# Patient Record
Sex: Female | Born: 1965
Health system: Southern US, Community
[De-identification: ages and names within clinical notes are randomized; demographics above are authoritative.]

## PROBLEM LIST (undated history)

## (undated) DIAGNOSIS — K649 Unspecified hemorrhoids: Secondary | ICD-10-CM

## (undated) DIAGNOSIS — R112 Nausea with vomiting, unspecified: Secondary | ICD-10-CM

## (undated) DIAGNOSIS — R7309 Other abnormal glucose: Secondary | ICD-10-CM

## (undated) DIAGNOSIS — K648 Other hemorrhoids: Secondary | ICD-10-CM

## (undated) DIAGNOSIS — F319 Bipolar disorder, unspecified: Secondary | ICD-10-CM

## (undated) DIAGNOSIS — F419 Anxiety disorder, unspecified: Secondary | ICD-10-CM

## (undated) DIAGNOSIS — E119 Type 2 diabetes mellitus without complications: Secondary | ICD-10-CM

## (undated) DIAGNOSIS — K625 Hemorrhage of anus and rectum: Secondary | ICD-10-CM

## (undated) DIAGNOSIS — K219 Gastro-esophageal reflux disease without esophagitis: Secondary | ICD-10-CM

## (undated) DIAGNOSIS — N938 Other specified abnormal uterine and vaginal bleeding: Secondary | ICD-10-CM

## (undated) DIAGNOSIS — R079 Chest pain, unspecified: Secondary | ICD-10-CM

## (undated) DIAGNOSIS — D649 Anemia, unspecified: Secondary | ICD-10-CM

## (undated) DIAGNOSIS — U071 COVID-19: Secondary | ICD-10-CM

## (undated) DIAGNOSIS — F329 Major depressive disorder, single episode, unspecified: Secondary | ICD-10-CM

## (undated) DIAGNOSIS — F32A Depression, unspecified: Secondary | ICD-10-CM

## (undated) DIAGNOSIS — R51 Headache: Secondary | ICD-10-CM

## (undated) DIAGNOSIS — N761 Subacute and chronic vaginitis: Secondary | ICD-10-CM

## (undated) DIAGNOSIS — E669 Obesity, unspecified: Secondary | ICD-10-CM

## (undated) DIAGNOSIS — K810 Acute cholecystitis: Secondary | ICD-10-CM

## (undated) DIAGNOSIS — K5792 Diverticulitis of intestine, part unspecified, without perforation or abscess without bleeding: Secondary | ICD-10-CM

## (undated) DIAGNOSIS — I10 Essential (primary) hypertension: Secondary | ICD-10-CM

## (undated) HISTORY — DX: Obesity, unspecified: E66.9

## (undated) HISTORY — DX: Chest pain, unspecified: R07.9

## (undated) HISTORY — PX: COLONOSCOPY: SHX174

## (undated) HISTORY — DX: Nausea with vomiting, unspecified: R11.2

## (undated) HISTORY — PX: ABDOMINAL HYSTERECTOMY: SHX81

## (undated) HISTORY — DX: Bipolar disorder, unspecified: F31.9

## (undated) HISTORY — DX: Other hemorrhoids: K64.8

## (undated) HISTORY — DX: Depression, unspecified: F32.A

## (undated) HISTORY — DX: Type 2 diabetes mellitus without complications: E11.9

## (undated) HISTORY — DX: Other specified abnormal uterine and vaginal bleeding: N93.8

## (undated) HISTORY — DX: Essential (primary) hypertension: I10

## (undated) HISTORY — PX: ROBOTIC ASSISTED LAP VAGINAL HYSTERECTOMY: SHX2362

## (undated) HISTORY — DX: Major depressive disorder, single episode, unspecified: F32.9

## (undated) HISTORY — DX: Subacute and chronic vaginitis: N76.1

## (undated) HISTORY — DX: Hemorrhage of anus and rectum: K62.5

## (undated) HISTORY — DX: Anxiety disorder, unspecified: F41.9

## (undated) HISTORY — PX: ECTOPIC PREGNANCY SURGERY: SHX613

## (undated) HISTORY — DX: Anemia, unspecified: D64.9

## (undated) HISTORY — DX: Other abnormal glucose: R73.09

## (undated) HISTORY — DX: Acute cholecystitis: K81.0

## (undated) HISTORY — PX: HEMORRHOID BANDING: SHX5850

---
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
## 2002-10-27 ENCOUNTER — Emergency Department (HOSPITAL_COMMUNITY): Admission: EM | Admit: 2002-10-27 | Discharge: 2002-10-27 | Payer: Self-pay | Admitting: Emergency Medicine

## 2003-09-24 ENCOUNTER — Ambulatory Visit (HOSPITAL_COMMUNITY): Admission: RE | Admit: 2003-09-24 | Discharge: 2003-09-24 | Payer: Self-pay | Admitting: Internal Medicine

## 2004-06-08 ENCOUNTER — Ambulatory Visit: Payer: Self-pay | Admitting: Internal Medicine

## 2004-08-09 ENCOUNTER — Ambulatory Visit: Payer: Self-pay | Admitting: Family Medicine

## 2004-08-25 ENCOUNTER — Ambulatory Visit: Payer: Self-pay | Admitting: Family Medicine

## 2004-08-26 ENCOUNTER — Ambulatory Visit: Payer: Self-pay | Admitting: *Deleted

## 2004-09-08 ENCOUNTER — Ambulatory Visit: Payer: Self-pay | Admitting: Family Medicine

## 2004-12-24 ENCOUNTER — Ambulatory Visit: Payer: Self-pay | Admitting: Family Medicine

## 2005-04-05 ENCOUNTER — Ambulatory Visit: Payer: Self-pay | Admitting: Family Medicine

## 2005-04-27 ENCOUNTER — Ambulatory Visit: Payer: Self-pay | Admitting: Family Medicine

## 2005-05-12 ENCOUNTER — Ambulatory Visit: Payer: Self-pay | Admitting: Family Medicine

## 2005-06-28 ENCOUNTER — Ambulatory Visit: Payer: Self-pay | Admitting: Family Medicine

## 2005-07-08 ENCOUNTER — Ambulatory Visit: Payer: Self-pay | Admitting: Family Medicine

## 2005-08-04 ENCOUNTER — Emergency Department (HOSPITAL_COMMUNITY): Admission: EM | Admit: 2005-08-04 | Discharge: 2005-08-04 | Payer: Self-pay | Admitting: Emergency Medicine

## 2005-08-22 ENCOUNTER — Ambulatory Visit: Payer: Self-pay | Admitting: Family Medicine

## 2005-09-12 ENCOUNTER — Ambulatory Visit: Payer: Self-pay | Admitting: Family Medicine

## 2005-10-14 ENCOUNTER — Emergency Department (HOSPITAL_COMMUNITY): Admission: EM | Admit: 2005-10-14 | Discharge: 2005-10-14 | Payer: Self-pay | Admitting: Emergency Medicine

## 2005-10-28 ENCOUNTER — Emergency Department (HOSPITAL_COMMUNITY): Admission: EM | Admit: 2005-10-28 | Discharge: 2005-10-28 | Payer: Self-pay | Admitting: Emergency Medicine

## 2006-12-12 ENCOUNTER — Emergency Department (HOSPITAL_COMMUNITY): Admission: EM | Admit: 2006-12-12 | Discharge: 2006-12-13 | Payer: Self-pay | Admitting: Emergency Medicine

## 2007-01-14 ENCOUNTER — Emergency Department (HOSPITAL_COMMUNITY): Admission: EM | Admit: 2007-01-14 | Discharge: 2007-01-14 | Payer: Self-pay | Admitting: Emergency Medicine

## 2007-04-18 ENCOUNTER — Emergency Department (HOSPITAL_COMMUNITY): Admission: EM | Admit: 2007-04-18 | Discharge: 2007-04-18 | Payer: Self-pay | Admitting: Emergency Medicine

## 2007-07-02 ENCOUNTER — Emergency Department (HOSPITAL_COMMUNITY): Admission: EM | Admit: 2007-07-02 | Discharge: 2007-07-02 | Payer: Self-pay | Admitting: Emergency Medicine

## 2010-09-22 ENCOUNTER — Emergency Department (HOSPITAL_COMMUNITY): Payer: Self-pay

## 2010-09-22 ENCOUNTER — Emergency Department (HOSPITAL_COMMUNITY)
Admission: EM | Admit: 2010-09-22 | Discharge: 2010-09-22 | Disposition: A | Discharge status: Home / Self Care | Payer: Self-pay | Attending: Emergency Medicine | Admitting: Emergency Medicine

## 2010-09-22 DIAGNOSIS — N83209 Unspecified ovarian cyst, unspecified side: Secondary | ICD-10-CM | POA: Insufficient documentation

## 2010-09-22 DIAGNOSIS — D259 Leiomyoma of uterus, unspecified: Secondary | ICD-10-CM | POA: Insufficient documentation

## 2010-09-22 DIAGNOSIS — N898 Other specified noninflammatory disorders of vagina: Secondary | ICD-10-CM | POA: Insufficient documentation

## 2010-09-22 DIAGNOSIS — R109 Unspecified abdominal pain: Secondary | ICD-10-CM | POA: Insufficient documentation

## 2010-09-22 LAB — URINE MICROSCOPIC-ADD ON

## 2010-09-22 LAB — DIFFERENTIAL
Basophils Absolute: 0 10*3/uL (ref 0.0–0.1)
Basophils Relative: 0 % (ref 0–1)
Eosinophils Absolute: 0.1 10*3/uL (ref 0.0–0.7)
Eosinophils Relative: 3 % (ref 0–5)
Lymphocytes Relative: 39 % (ref 12–46)
Lymphs Abs: 1.8 10*3/uL (ref 0.7–4.0)
Monocytes Absolute: 0.3 10*3/uL (ref 0.1–1.0)
Monocytes Relative: 7 % (ref 3–12)
Neutro Abs: 2.5 10*3/uL (ref 1.7–7.7)
Neutrophils Relative %: 52 % (ref 43–77)

## 2010-09-22 LAB — URINALYSIS, ROUTINE W REFLEX MICROSCOPIC
Bilirubin Urine: NEGATIVE
Glucose, UA: NEGATIVE mg/dL
Ketones, ur: NEGATIVE mg/dL
Leukocytes, UA: NEGATIVE
Nitrite: NEGATIVE
Protein, ur: NEGATIVE mg/dL
Specific Gravity, Urine: 1.027 (ref 1.005–1.030)
Urobilinogen, UA: 0.2 mg/dL (ref 0.0–1.0)
pH: 6 (ref 5.0–8.0)

## 2010-09-22 LAB — CBC
HCT: 34.7 % — ABNORMAL LOW (ref 36.0–46.0)
Hemoglobin: 11.3 g/dL — ABNORMAL LOW (ref 12.0–15.0)
MCH: 28.9 pg (ref 26.0–34.0)
MCHC: 32.6 g/dL (ref 30.0–36.0)
MCV: 88.7 fL (ref 78.0–100.0)
Platelets: 264 10*3/uL (ref 150–400)
RBC: 3.91 MIL/uL (ref 3.87–5.11)
RDW: 15.8 % — ABNORMAL HIGH (ref 11.5–15.5)
WBC: 4.7 10*3/uL (ref 4.0–10.5)

## 2010-09-22 LAB — POCT I-STAT, CHEM 8
BUN: 9 mg/dL (ref 6–23)
Calcium, Ion: 1.17 mmol/L (ref 1.12–1.32)
Chloride: 104 mEq/L (ref 96–112)
Creatinine, Ser: 0.7 mg/dL (ref 0.50–1.10)
Glucose, Bld: 97 mg/dL (ref 70–99)
HCT: 37 % (ref 36.0–46.0)
Hemoglobin: 12.6 g/dL (ref 12.0–15.0)
Potassium: 4.2 mEq/L (ref 3.5–5.1)
Sodium: 141 mEq/L (ref 135–145)
TCO2: 26 mmol/L (ref 0–100)

## 2010-09-22 LAB — POCT PREGNANCY, URINE: Preg Test, Ur: NEGATIVE

## 2010-09-22 LAB — WET PREP, GENITAL
Trich, Wet Prep: NONE SEEN
Yeast Wet Prep HPF POC: NONE SEEN

## 2010-09-23 LAB — URINE CULTURE
Colony Count: >=100000
Culture  Setup Time: 201207181552

## 2010-09-23 LAB — GC/CHLAMYDIA PROBE AMP, GENITAL
Chlamydia, DNA Probe: NEGATIVE
GC Probe Amp, Genital: NEGATIVE

## 2010-11-26 ENCOUNTER — Encounter: Payer: Self-pay | Admitting: Obstetrics & Gynecology

## 2010-11-26 ENCOUNTER — Other Ambulatory Visit (HOSPITAL_COMMUNITY)
Admission: RE | Admit: 2010-11-26 | Discharge: 2010-11-26 | Disposition: A | Discharge status: Home / Self Care | Payer: Self-pay | Source: Ambulatory Visit | Attending: Obstetrics & Gynecology | Admitting: Obstetrics & Gynecology

## 2010-11-26 ENCOUNTER — Ambulatory Visit (INDEPENDENT_AMBULATORY_CARE_PROVIDER_SITE_OTHER): Payer: Self-pay | Admitting: Obstetrics & Gynecology

## 2010-11-26 VITALS — BP 128/80 | HR 80 | Temp 97.6°F | Ht 66.0 in | Wt 154.7 lb

## 2010-11-26 DIAGNOSIS — N938 Other specified abnormal uterine and vaginal bleeding: Secondary | ICD-10-CM | POA: Insufficient documentation

## 2010-11-26 DIAGNOSIS — D259 Leiomyoma of uterus, unspecified: Secondary | ICD-10-CM

## 2010-11-26 DIAGNOSIS — N925 Other specified irregular menstruation: Secondary | ICD-10-CM | POA: Insufficient documentation

## 2010-11-26 DIAGNOSIS — N949 Unspecified condition associated with female genital organs and menstrual cycle: Secondary | ICD-10-CM

## 2010-11-26 DIAGNOSIS — D219 Benign neoplasm of connective and other soft tissue, unspecified: Secondary | ICD-10-CM

## 2010-11-26 LAB — POCT PREGNANCY, URINE: Preg Test, Ur: NEGATIVE

## 2010-11-26 MED ORDER — NORETHIN-ETH ESTRAD-FE BIPHAS 1 MG-10 MCG / 10 MCG PO TABS: 1.0000 | ORAL_TABLET | Freq: Every day | ORAL | Status: DC

## 2010-11-26 NOTE — Progress Notes (Signed)
Addended by: Sherre Lain A on: 11/26/2010 11:45 AM   Modules accepted: Orders

## 2010-11-26 NOTE — Progress Notes (Signed)
  Subjective:    Patient ID: Deborah Haney, female    DOB: January 26, 1966, 45 y.o.   MRN: [id number]  HPI  Deborah Haney is a 45 yo SAA G1P0ectopic1 who is referred here for evaluation of heavy bleeding.  She was seen at [facility] and a Ultrasound showed 2 large fibroids 6 cm and 5 cm. Her uterine lining was also thickened at 2 cm.  She was started on OCPs which has reduced the amount of bleeding.  Review of Systems Pap and mammogram within the last 12 month at health dept were reportedly normal    Objective:   Physical Exam  Bimanual reveals a 10-12 week size uterus and a narrow pelvic arch. Endometrial biopsy was done with a Pipelle for 2 passes and a large amount of tissue after prepping the cervix with betadine.  She tolerated the procedure well.      Assessment & Plan:  DUB and fibroids Await EMBX results. Consider TAH She will need a TSH at her next visit.

## 2010-11-30 LAB — DIFFERENTIAL
Basophils Absolute: 0
Basophils Relative: 1
Eosinophils Absolute: 0.2
Eosinophils Relative: 3
Lymphocytes Relative: 26
Lymphs Abs: 1.7
Monocytes Absolute: 0.5
Monocytes Relative: 7
Neutro Abs: 4.3
Neutrophils Relative %: 64

## 2010-11-30 LAB — CBC
HCT: 32.1 — ABNORMAL LOW
Hemoglobin: 10.6 — ABNORMAL LOW
MCHC: 33
MCV: 85.6
Platelets: 292
RBC: 3.75 — ABNORMAL LOW
RDW: 17.3 — ABNORMAL HIGH
WBC: 6.7

## 2010-11-30 LAB — RAPID URINE DRUG SCREEN, HOSP PERFORMED
Amphetamines: NOT DETECTED
Barbiturates: NOT DETECTED
Benzodiazepines: NOT DETECTED
Cocaine: POSITIVE — AB
Opiates: NOT DETECTED
Tetrahydrocannabinol: NOT DETECTED

## 2010-11-30 LAB — POCT I-STAT, CHEM 8
BUN: 12
Calcium, Ion: 1.17
Chloride: 104
Creatinine, Ser: 0.9
Glucose, Bld: 104 — ABNORMAL HIGH
HCT: 34 — ABNORMAL LOW
Hemoglobin: 11.6 — ABNORMAL LOW
Potassium: 4
Sodium: 141
TCO2: 27

## 2010-11-30 LAB — POCT PREGNANCY, URINE
Operator id: 27065
Preg Test, Ur: NEGATIVE

## 2010-12-13 ENCOUNTER — Telehealth: Payer: Self-pay | Admitting: *Deleted

## 2010-12-13 NOTE — Telephone Encounter (Signed)
Pt. Called today at 9:36 am and left a message she wants results from her 11/26/10 visit

## 2010-12-13 NOTE — Telephone Encounter (Signed)
Telephoned pt left message to return call.

## 2010-12-14 LAB — RAPID STREP SCREEN (MED CTR MEBANE ONLY): Streptococcus, Group A Screen (Direct): NEGATIVE

## 2010-12-15 NOTE — Telephone Encounter (Signed)
Spoke with patient, advised her of endometrial biopsy results. Pt wants to make an appointment to see Dr again to discuss fibroids. Pt transferred to front desk to make follow up apt.

## 2010-12-16 LAB — I-STAT 8, (EC8 V) (CONVERTED LAB)
Acid-Base Excess: 1
BUN: 10
Bicarbonate: 27.5 — ABNORMAL HIGH
Chloride: 105
Glucose, Bld: 92
HCT: 39
Hemoglobin: 13.3
Operator id: 270111
Potassium: 3.9
Sodium: 140
TCO2: 29
pCO2, Ven: 48.8
pH, Ven: 7.359 — ABNORMAL HIGH

## 2010-12-16 LAB — URINALYSIS, ROUTINE W REFLEX MICROSCOPIC
Glucose, UA: NEGATIVE
Hgb urine dipstick: NEGATIVE
Ketones, ur: 15 — AB
Nitrite: NEGATIVE
Protein, ur: 30 — AB
Specific Gravity, Urine: 1.031 — ABNORMAL HIGH
Urobilinogen, UA: 1
pH: 6

## 2010-12-16 LAB — CBC
HCT: 35.4 — ABNORMAL LOW
Hemoglobin: 11.6 — ABNORMAL LOW
MCHC: 32.8
MCV: 87.6
Platelets: 335
RBC: 4.04
RDW: 16.5 — ABNORMAL HIGH
WBC: 5.5

## 2010-12-16 LAB — URINE MICROSCOPIC-ADD ON

## 2010-12-16 LAB — WET PREP, GENITAL

## 2010-12-16 LAB — DIFFERENTIAL
Basophils Absolute: 0
Basophils Relative: 1
Eosinophils Absolute: 0.1
Eosinophils Relative: 1
Lymphocytes Relative: 34
Lymphs Abs: 1.9
Monocytes Absolute: 0.3
Monocytes Relative: 5
Neutro Abs: 3.3
Neutrophils Relative %: 59

## 2010-12-16 LAB — GC/CHLAMYDIA PROBE AMP, GENITAL
Chlamydia, DNA Probe: NEGATIVE
GC Probe Amp, Genital: NEGATIVE

## 2010-12-16 LAB — POCT PREGNANCY, URINE
Operator id: 272551
Preg Test, Ur: NEGATIVE

## 2010-12-16 LAB — POCT I-STAT CREATININE
Creatinine, Ser: 0.9
Operator id: 270111

## 2010-12-16 LAB — RPR: RPR Ser Ql: NONREACTIVE

## 2011-01-06 ENCOUNTER — Ambulatory Visit (INDEPENDENT_AMBULATORY_CARE_PROVIDER_SITE_OTHER): Payer: Self-pay | Admitting: Obstetrics & Gynecology

## 2011-01-06 DIAGNOSIS — N949 Unspecified condition associated with female genital organs and menstrual cycle: Secondary | ICD-10-CM

## 2011-01-06 DIAGNOSIS — Z23 Encounter for immunization: Secondary | ICD-10-CM

## 2011-01-06 DIAGNOSIS — N938 Other specified abnormal uterine and vaginal bleeding: Secondary | ICD-10-CM

## 2011-01-06 DIAGNOSIS — N925 Other specified irregular menstruation: Secondary | ICD-10-CM

## 2011-01-06 DIAGNOSIS — Z0000 Encounter for general adult medical examination without abnormal findings: Secondary | ICD-10-CM

## 2011-01-06 MED ORDER — NORGESTIM-ETH ESTRAD TRIPHASIC 0.18/0.215/0.25 MG-25 MCG PO TABS: 1.0000 | ORAL_TABLET | Freq: Every day | ORAL | Status: DC

## 2011-01-06 MED ORDER — NORGESTIM-ETH ESTRAD TRIPHASIC 0.18/0.215/0.25 MG-35 MCG PO TABS: 1.0000 | ORAL_TABLET | Freq: Every day | ORAL | Status: DC

## 2011-01-06 MED ORDER — INFLUENZA VIRUS VACC SPLIT PF IM SUSP
0.5000 mL | Freq: Once | INTRAMUSCULAR | Status: AC
  Administered 2011-01-06: 0.5 mL via INTRAMUSCULAR

## 2011-01-06 NOTE — Progress Notes (Signed)
  Subjective:    Patient ID: Deborah Haney, female    DOB: 1965/09/13, 45 y.o.   MRN: [id number]  HPI  Ms. Wiacek has had heavy periods due to her fibroids.  She had a benign endometrial biopsy at her last visit.    Review of Systems    She tells me she had a normal mammogram in January 2012. Objective:   Physical Exam        Assessment & Plan:  DUB from fibroids- I will complete the work up by checking a TSH today.  We have discussed treatment options. She declines depo provera but is willing to try OCPs.  She would like to have a TAH at least scheduled in case the OCPs don't help her bleeding.  She is willing to have a flu shot today. She will get a pap smear and BP check in about 2 weeks.

## 2011-01-07 LAB — TSH: TSH: 1.087 u[IU]/mL (ref 0.350–4.500)

## 2011-01-17 ENCOUNTER — Ambulatory Visit (INDEPENDENT_AMBULATORY_CARE_PROVIDER_SITE_OTHER): Payer: Self-pay

## 2011-01-17 VITALS — BP 133/86 | HR 74

## 2011-01-17 DIAGNOSIS — Z0130 Encounter for examination of blood pressure without abnormal findings: Secondary | ICD-10-CM

## 2011-01-17 DIAGNOSIS — Z136 Encounter for screening for cardiovascular disorders: Secondary | ICD-10-CM

## 2011-01-17 NOTE — Progress Notes (Signed)
Informed Dr. Adrian Blackwater of the BP and he stated that she was fine.  Pt instructed to continue regimen as directed.

## 2011-01-25 ENCOUNTER — Telehealth: Payer: Self-pay | Admitting: *Deleted

## 2011-01-25 MED ORDER — NORGESTIM-ETH ESTRAD TRIPHASIC 0.18/0.215/0.25 MG-35 MCG PO TABS: 1.0000 | ORAL_TABLET | Freq: Every day | ORAL | Status: DC

## 2011-01-25 NOTE — Telephone Encounter (Signed)
Spoke w/pt and she stated that her Rx was not sent to the pharmacy. I reviewed the order and discovered that the Rx was printed and not e-prescribed.  I sent the order through as originally prescribed. Pt asked about the recovery time and I told her that it will be approx. 6wks- in some cases pts may be able to return to work after 4 wks with MD permission.  Pt also would like to postpone her surgery until January when she will have sick leave available @ her job. Pt voiced understanding of all information and I transferred her call to [place] for surgery re-scheduling.

## 2011-01-25 NOTE — Telephone Encounter (Signed)
Pt left message stating that she has questions about surgery and recovery time. Please call back.

## 2011-01-29 ENCOUNTER — Encounter (HOSPITAL_COMMUNITY): Payer: Self-pay

## 2011-02-02 ENCOUNTER — Ambulatory Visit: Payer: Self-pay | Admitting: Advanced Practice Midwife

## 2011-02-04 ENCOUNTER — Other Ambulatory Visit (HOSPITAL_COMMUNITY): Payer: Self-pay

## 2011-03-02 ENCOUNTER — Encounter: Payer: Self-pay | Admitting: Family

## 2011-03-02 ENCOUNTER — Ambulatory Visit (INDEPENDENT_AMBULATORY_CARE_PROVIDER_SITE_OTHER): Payer: Self-pay | Admitting: Family

## 2011-03-02 VITALS — BP 126/88 | HR 81 | Temp 99.0°F | Ht 66.25 in | Wt 152.2 lb

## 2011-03-02 DIAGNOSIS — N938 Other specified abnormal uterine and vaginal bleeding: Secondary | ICD-10-CM

## 2011-03-02 DIAGNOSIS — N925 Other specified irregular menstruation: Secondary | ICD-10-CM

## 2011-03-02 DIAGNOSIS — Z01419 Encounter for gynecological examination (general) (routine) without abnormal findings: Secondary | ICD-10-CM

## 2011-03-02 DIAGNOSIS — N949 Unspecified condition associated with female genital organs and menstrual cycle: Secondary | ICD-10-CM

## 2011-03-02 DIAGNOSIS — Z0000 Encounter for general adult medical examination without abnormal findings: Secondary | ICD-10-CM

## 2011-03-02 HISTORY — DX: Other specified abnormal uterine and vaginal bleeding: N93.8

## 2011-03-02 NOTE — Progress Notes (Signed)
Addended by: Faythe Casa on: 03/02/2011 03:56 PM   Modules accepted: Orders

## 2011-03-02 NOTE — Patient Instructions (Signed)

## 2011-03-02 NOTE — Progress Notes (Signed)
  Subjective:     Deborah Haney is a 45 y.o. female and is here for a comprehensive physical exam. The patient reports improvement in vaginal bleeding since starting ocps, considering cancelling surgery.Marland Kitchen  History   Social History  . Marital Status: Single    Spouse Name: N/A    Number of Children: N/A  . Years of Education: N/A   Occupational History  . Not on file.   Social History Main Topics  . Smoking status: Former Smoker    Types: Cigarettes  . Smokeless tobacco: Never Used  . Alcohol Use: No  . Drug Use: Yes    Special: Cocaine     2 years ago  . Sexually Active: Yes    Birth Control/ Protection: Pill   Other Topics Concern  . Not on file   Social History Narrative  . No narrative on file   Health Maintenance  Topic Date Due  . Pap Smear  11/09/1985  . Tetanus/tdap  11/10/1986  . Influenza Vaccine  12/06/2011    The following portions of the patient's history were reviewed and updated as appropriate: allergies, current medications, past family history, past medical history, past social history, past surgical history and problem list.  Review of Systems Pertinent items are noted in HPI.   Objective:     General appearance: alert, cooperative and appears older than stated age Head: Normocephalic, without obvious abnormality, atraumatic Neck: no adenopathy, no carotid bruit, no JVD, supple, symmetrical, trachea midline and thyroid not enlarged, symmetric, no tenderness/mass/nodules Lungs: clear to auscultation bilaterally Breasts: normal appearance, no masses or tenderness, No nipple retraction or dimpling, No nipple discharge or bleeding, No axillary or supraclavicular adenopathy, Normal to palpation without dominant masses, Taught monthly breast self examination Heart: regular rate and rhythm, S1, S2 normal, no murmur, click, rub or gallop Abdomen: soft, non-tender; bowel sounds normal; no masses,  no organomegaly Pelvic: cervix normal in appearance, external  genitalia normal, no adnexal masses or tenderness, no cervical motion tenderness, rectovaginal septum normal, uterus normal size, shape, and consistency and vagina normal without discharge Skin: Skin color, texture, turgor normal. No rashes or lesions    Assessment:    Healthy female exam.  Plan:     Pap smear to lab. Explained self breast exam Pt to get mammogram next year (2013) [facility]

## 2011-03-25 ENCOUNTER — Inpatient Hospital Stay (HOSPITAL_COMMUNITY): Admission: RE | Admit: 2011-03-25 | Discharge: 2011-03-25 | Payer: Self-pay | Source: Ambulatory Visit

## 2011-03-25 NOTE — Patient Instructions (Signed)
   Your procedure is scheduled on: Monday, Jan 21st  Enter through the Main Entrance of [facility] at: 1130am Pick up the phone at the desk and dial [phone] and inform us of your arrival.  Please call this number if you have any problems the morning of surgery: [phone]  Remember: Do not eat food after midnight: Sunday Do not drink clear liquids after: 9am Monday Take these medicines the morning of surgery with a SIP OF WATER:  Do not wear jewelry, make-up, or FINGER nail polish Do not wear lotions, powders, perfumes or deodorant. Do not shave 48 hours prior to surgery. Do not bring valuables to the hospital.  Leave suitcase in the car. After Surgery it may be brought to your room. For patients being admitted to the hospital, checkout time is 11:00am the day of discharge.  Patients discharged on the day of surgery will not be allowed to drive home.     Remember to use your hibiclens as instructed.Please shower with 1/2 bottle the evening before your surgery and the other 1/2 bottle the morning of surgery.

## 2011-03-28 ENCOUNTER — Inpatient Hospital Stay (HOSPITAL_COMMUNITY): Admission: RE | Admit: 2011-03-28 | Payer: Self-pay | Source: Ambulatory Visit | Admitting: Obstetrics & Gynecology

## 2011-03-28 ENCOUNTER — Encounter (HOSPITAL_COMMUNITY): Admission: RE | Payer: Self-pay | Source: Ambulatory Visit

## 2011-03-28 SURGERY — ROBOTIC ASSISTED TOTAL HYSTERECTOMY
Anesthesia: General

## 2011-05-02 ENCOUNTER — Encounter (HOSPITAL_COMMUNITY): Payer: Self-pay

## 2011-05-09 ENCOUNTER — Encounter (HOSPITAL_COMMUNITY): Payer: Self-pay

## 2011-05-09 ENCOUNTER — Encounter (HOSPITAL_COMMUNITY)
Admission: RE | Admit: 2011-05-09 | Discharge: 2011-05-09 | Disposition: A | Discharge status: Home / Self Care | Payer: Self-pay | Source: Ambulatory Visit | Attending: Obstetrics & Gynecology | Admitting: Obstetrics & Gynecology

## 2011-05-09 HISTORY — DX: Diverticulitis of intestine, part unspecified, without perforation or abscess without bleeding: K57.92

## 2011-05-09 HISTORY — DX: Gastro-esophageal reflux disease without esophagitis: K21.9

## 2011-05-09 HISTORY — DX: Headache: R51

## 2011-05-09 LAB — SURGICAL PCR SCREEN
MRSA, PCR: NEGATIVE
Staphylococcus aureus: NEGATIVE

## 2011-05-09 LAB — CBC
HCT: 28.2 % — ABNORMAL LOW (ref 36.0–46.0)
Hemoglobin: 8.6 g/dL — ABNORMAL LOW (ref 12.0–15.0)
MCH: 22.1 pg — ABNORMAL LOW (ref 26.0–34.0)
MCHC: 30.5 g/dL (ref 30.0–36.0)
MCV: 72.3 fL — ABNORMAL LOW (ref 78.0–100.0)
Platelets: 296 10*3/uL (ref 150–400)
RBC: 3.9 MIL/uL (ref 3.87–5.11)
RDW: 19.3 % — ABNORMAL HIGH (ref 11.5–15.5)
WBC: 7.3 10*3/uL (ref 4.0–10.5)

## 2011-05-09 NOTE — Pre-Procedure Instructions (Signed)
Hgb 8.6 and Hct 28.2-T/S office-Marnie called to notify Dr Sherrian Divers orders at pat

## 2011-05-09 NOTE — Patient Instructions (Addendum)
   Your procedure is scheduled on: Monday March 11th  Enter through the Main Entrance of [facility] at: [employer] up the phone at the desk and dial [phone] and inform us of your arrival.  Please call this number if you have any problems the morning of surgery: [phone]  Remember: Do not eat food after midnight: Sunday Do not drink clear liquids after:midnight Sunday Take these medicines the morning of surgery with a SIP OF WATER: none  Do not wear jewelry, make-up, or FINGER nail polish Do not wear lotions, powders, perfumes or deodorant. Do not shave 48 hours prior to surgery. Do not bring valuables to the hospital. Contacts, dentures or bridgework may not be worn into surgery.  Leave suitcase in the car. After Surgery it may be brought to your room. For patients being admitted to the hospital, checkout time is 11:00am the day of discharge.  Patients discharged on the day of surgery will not be allowed to drive home.     Remember to use your hibiclens as instructed.Please shower with 1/2 bottle the evening before your surgery and the other 1/2 bottle the morning of surgery. Neck down avoiding private area.

## 2011-05-16 ENCOUNTER — Ambulatory Visit (HOSPITAL_COMMUNITY)
Admission: RE | Admit: 2011-05-16 | Discharge: 2011-05-16 | Disposition: A | Discharge status: Home / Self Care | Payer: Self-pay | Source: Ambulatory Visit | Attending: Obstetrics & Gynecology | Admitting: Obstetrics & Gynecology

## 2011-05-16 ENCOUNTER — Encounter (HOSPITAL_COMMUNITY): Payer: Self-pay

## 2011-05-16 ENCOUNTER — Ambulatory Visit (HOSPITAL_COMMUNITY): Payer: Self-pay

## 2011-05-16 ENCOUNTER — Encounter (HOSPITAL_COMMUNITY)
Admission: RE | Disposition: A | Discharge status: Home / Self Care | Payer: Self-pay | Source: Ambulatory Visit | Attending: Obstetrics & Gynecology

## 2011-05-16 DIAGNOSIS — D251 Intramural leiomyoma of uterus: Secondary | ICD-10-CM

## 2011-05-16 DIAGNOSIS — N8000 Endometriosis of the uterus, unspecified: Secondary | ICD-10-CM | POA: Insufficient documentation

## 2011-05-16 DIAGNOSIS — N920 Excessive and frequent menstruation with regular cycle: Secondary | ICD-10-CM

## 2011-05-16 DIAGNOSIS — N840 Polyp of corpus uteri: Secondary | ICD-10-CM | POA: Insufficient documentation

## 2011-05-16 DIAGNOSIS — D252 Subserosal leiomyoma of uterus: Secondary | ICD-10-CM

## 2011-05-16 DIAGNOSIS — D649 Anemia, unspecified: Secondary | ICD-10-CM

## 2011-05-16 DIAGNOSIS — N838 Other noninflammatory disorders of ovary, fallopian tube and broad ligament: Secondary | ICD-10-CM | POA: Insufficient documentation

## 2011-05-16 LAB — PREGNANCY, URINE: Preg Test, Ur: NEGATIVE

## 2011-05-16 SURGERY — ROBOTIC ASSISTED TOTAL HYSTERECTOMY
Anesthesia: General | Site: Abdomen | Laterality: Right | Wound class: Clean Contaminated

## 2011-05-16 MED ORDER — DEXAMETHASONE SODIUM PHOSPHATE 4 MG/ML IJ SOLN
INTRAMUSCULAR | Status: DC | PRN
  Administered 2011-05-16: 10 mg via INTRAVENOUS

## 2011-05-16 MED ORDER — ACETAMINOPHEN 10 MG/ML IV SOLN
1000.0000 mg | Freq: Four times a day (QID) | INTRAVENOUS | Status: DC
  Administered 2011-05-16: 1000 mg via INTRAVENOUS
  Filled 2011-05-16 (×4): qty 100

## 2011-05-16 MED ORDER — FENTANYL CITRATE 0.05 MG/ML IJ SOLN
INTRAMUSCULAR | Status: DC | PRN
  Administered 2011-05-16 (×4): 25 ug via INTRAVENOUS
  Administered 2011-05-16 (×4): 50 ug via INTRAVENOUS

## 2011-05-16 MED ORDER — DEXTROSE IN LACTATED RINGERS 5 % IV SOLN
INTRAVENOUS | Status: DC
  Administered 2011-05-16: 12:00:00 via INTRAVENOUS

## 2011-05-16 MED ORDER — MIDAZOLAM HCL 5 MG/5ML IJ SOLN
INTRAMUSCULAR | Status: DC | PRN
  Administered 2011-05-16: 2 mg via INTRAVENOUS

## 2011-05-16 MED ORDER — ROCURONIUM BROMIDE 100 MG/10ML IV SOLN
INTRAVENOUS | Status: DC | PRN
  Administered 2011-05-16: 10 mg via INTRAVENOUS
  Administered 2011-05-16: 5 mg via INTRAVENOUS
  Administered 2011-05-16: 50 mg via INTRAVENOUS

## 2011-05-16 MED ORDER — CEFAZOLIN SODIUM 1-5 GM-% IV SOLN
1.0000 g | INTRAVENOUS | Status: AC
  Administered 2011-05-16: 1 g via INTRAVENOUS

## 2011-05-16 MED ORDER — MIDAZOLAM HCL 2 MG/2ML IJ SOLN
INTRAMUSCULAR | Status: AC
  Filled 2011-05-16: qty 2

## 2011-05-16 MED ORDER — LACTATED RINGERS IV SOLN
INTRAVENOUS | Status: DC
  Administered 2011-05-16: 08:00:00 via INTRAVENOUS
  Administered 2011-05-16: 125 mL/h via INTRAVENOUS

## 2011-05-16 MED ORDER — LIDOCAINE HCL (CARDIAC) 20 MG/ML IV SOLN
INTRAVENOUS | Status: AC
  Filled 2011-05-16: qty 5

## 2011-05-16 MED ORDER — LIDOCAINE HCL (CARDIAC) 20 MG/ML IV SOLN
INTRAVENOUS | Status: DC | PRN
  Administered 2011-05-16: 30 mg via INTRAVENOUS

## 2011-05-16 MED ORDER — STERILE WATER FOR IRRIGATION IR SOLN
Status: DC | PRN
  Administered 2011-05-16: 1000 mL

## 2011-05-16 MED ORDER — NEOSTIGMINE METHYLSULFATE 1 MG/ML IJ SOLN
INTRAMUSCULAR | Status: AC
  Filled 2011-05-16: qty 10

## 2011-05-16 MED ORDER — LACTATED RINGERS IR SOLN
Status: DC | PRN
  Administered 2011-05-16: 3000 mL

## 2011-05-16 MED ORDER — FENTANYL CITRATE 0.05 MG/ML IJ SOLN
INTRAMUSCULAR | Status: AC
  Filled 2011-05-16: qty 5

## 2011-05-16 MED ORDER — IBUPROFEN 200 MG PO TABS: 800.0000 mg | ORAL_TABLET | Freq: Four times a day (QID) | ORAL | Status: AC | PRN

## 2011-05-16 MED ORDER — PANTOPRAZOLE SODIUM 40 MG PO TBEC
DELAYED_RELEASE_TABLET | ORAL | Status: AC
  Administered 2011-05-16: 40 mg via ORAL
  Filled 2011-05-16: qty 1

## 2011-05-16 MED ORDER — HYDROMORPHONE HCL PF 1 MG/ML IJ SOLN
INTRAMUSCULAR | Status: AC
  Administered 2011-05-16: 0.5 mg via INTRAVENOUS
  Filled 2011-05-16: qty 1

## 2011-05-16 MED ORDER — ROCURONIUM BROMIDE 50 MG/5ML IV SOLN
INTRAVENOUS | Status: AC
  Filled 2011-05-16: qty 1

## 2011-05-16 MED ORDER — ROPIVACAINE HCL 5 MG/ML IJ SOLN
INTRAMUSCULAR | Status: DC | PRN
  Administered 2011-05-16: 60 mL

## 2011-05-16 MED ORDER — DEXAMETHASONE SODIUM PHOSPHATE 10 MG/ML IJ SOLN
INTRAMUSCULAR | Status: AC
  Filled 2011-05-16: qty 1

## 2011-05-16 MED ORDER — KETOROLAC TROMETHAMINE 30 MG/ML IJ SOLN
INTRAMUSCULAR | Status: DC | PRN
  Administered 2011-05-16: 30 mg via INTRAVENOUS

## 2011-05-16 MED ORDER — PROPOFOL 10 MG/ML IV EMUL
INTRAVENOUS | Status: AC
  Filled 2011-05-16: qty 20

## 2011-05-16 MED ORDER — DEXTROSE IN LACTATED RINGERS 5 % IV SOLN: INTRAVENOUS | Status: DC

## 2011-05-16 MED ORDER — INDIGOTINDISULFONATE SODIUM 8 MG/ML IJ SOLN
INTRAMUSCULAR | Status: AC
  Filled 2011-05-16: qty 5

## 2011-05-16 MED ORDER — INDIGOTINDISULFONATE SODIUM 8 MG/ML IJ SOLN
INTRAMUSCULAR | Status: DC | PRN
  Administered 2011-05-16: 5 mL via INTRAVENOUS

## 2011-05-16 MED ORDER — ROPIVACAINE HCL 5 MG/ML IJ SOLN
INTRAMUSCULAR | Status: AC
  Filled 2011-05-16: qty 60

## 2011-05-16 MED ORDER — SODIUM CHLORIDE 0.9 % IJ SOLN
INTRAMUSCULAR | Status: DC | PRN
  Administered 2011-05-16: 60 mL via INTRAVENOUS

## 2011-05-16 MED ORDER — IBUPROFEN 800 MG PO TABS: 800.0000 mg | ORAL_TABLET | Freq: Four times a day (QID) | ORAL | Status: DC | PRN

## 2011-05-16 MED ORDER — HYDROMORPHONE HCL PF 1 MG/ML IJ SOLN
0.2500 mg | INTRAMUSCULAR | Status: DC | PRN
  Administered 2011-05-16: 0.5 mg via INTRAVENOUS

## 2011-05-16 MED ORDER — OXYCODONE-ACETAMINOPHEN 5-325 MG PO TABS: 1.0000 | ORAL_TABLET | ORAL | Status: AC | PRN

## 2011-05-16 MED ORDER — NEOSTIGMINE METHYLSULFATE 1 MG/ML IJ SOLN
INTRAMUSCULAR | Status: DC | PRN
  Administered 2011-05-16: 5 mg via INTRAVENOUS

## 2011-05-16 MED ORDER — FENTANYL CITRATE 0.05 MG/ML IJ SOLN
INTRAMUSCULAR | Status: AC
  Filled 2011-05-16: qty 2

## 2011-05-16 MED ORDER — PROPOFOL 10 MG/ML IV EMUL
INTRAVENOUS | Status: DC | PRN
  Administered 2011-05-16: 180 mg via INTRAVENOUS

## 2011-05-16 MED ORDER — GLYCOPYRROLATE 0.2 MG/ML IJ SOLN
INTRAMUSCULAR | Status: AC
  Filled 2011-05-16: qty 3

## 2011-05-16 MED ORDER — KETOROLAC TROMETHAMINE 30 MG/ML IJ SOLN
INTRAMUSCULAR | Status: AC
  Filled 2011-05-16: qty 1

## 2011-05-16 MED ORDER — PANTOPRAZOLE SODIUM 40 MG PO TBEC
40.0000 mg | DELAYED_RELEASE_TABLET | Freq: Once | ORAL | Status: AC
  Administered 2011-05-16: 40 mg via ORAL

## 2011-05-16 MED ORDER — CEFAZOLIN SODIUM 1-5 GM-% IV SOLN
INTRAVENOUS | Status: AC
  Filled 2011-05-16: qty 50

## 2011-05-16 MED ORDER — GLYCOPYRROLATE 0.2 MG/ML IJ SOLN
INTRAMUSCULAR | Status: DC | PRN
  Administered 2011-05-16: 1 mg via INTRAVENOUS

## 2011-05-16 MED ORDER — ONDANSETRON HCL 4 MG/2ML IJ SOLN
INTRAMUSCULAR | Status: DC | PRN
  Administered 2011-05-16: 4 mg via INTRAVENOUS

## 2011-05-16 MED ORDER — ONDANSETRON HCL 4 MG/2ML IJ SOLN
INTRAMUSCULAR | Status: AC
  Filled 2011-05-16: qty 2

## 2011-05-16 MED ORDER — OXYCODONE-ACETAMINOPHEN 5-325 MG PO TABS
1.0000 | ORAL_TABLET | ORAL | Status: DC | PRN
  Administered 2011-05-16: 1 via ORAL
  Filled 2011-05-16: qty 1

## 2011-05-16 SURGICAL SUPPLY — 78 items
ADH SKN CLS APL DERMABOND .7 (GAUZE/BANDAGES/DRESSINGS) ×1
APL SKNCLS STERI-STRIP NONHPOA (GAUZE/BANDAGES/DRESSINGS)
BAG URINE DRAINAGE (UROLOGICAL SUPPLIES) ×3 IMPLANT
BARRIER ADHS 3X4 INTERCEED (GAUZE/BANDAGES/DRESSINGS) IMPLANT
BENZOIN TINCTURE PRP APPL 2/3 (GAUZE/BANDAGES/DRESSINGS) IMPLANT
BLADE LAP MORCELLATOR 15X9.5 (ELECTROSURGICAL) IMPLANT
BRR ADH 4X3 ABS CNTRL BYND (GAUZE/BANDAGES/DRESSINGS)
CABLE HIGH FREQUENCY MONO STRZ (ELECTRODE) ×3 IMPLANT
CATH FOLEY 3WAY  5CC 16FR (CATHETERS) ×1
CATH FOLEY 3WAY 5CC 16FR (CATHETERS) ×2 IMPLANT
CHLORAPREP W/TINT 26ML (MISCELLANEOUS) ×3 IMPLANT
CLOTH BEACON ORANGE TIMEOUT ST (SAFETY) ×3 IMPLANT
CONT PATH 16OZ SNAP LID 3702 (MISCELLANEOUS) ×3 IMPLANT
COVER MAYO STAND STRL (DRAPES) ×3 IMPLANT
COVER TABLE BACK 60X90 (DRAPES) ×6 IMPLANT
COVER TIP SHEARS 8 DVNC (MISCELLANEOUS) ×2 IMPLANT
COVER TIP SHEARS 8MM DA VINCI (MISCELLANEOUS) ×1
DECANTER SPIKE VIAL GLASS SM (MISCELLANEOUS) ×12 IMPLANT
DERMABOND ADVANCED (GAUZE/BANDAGES/DRESSINGS) ×1
DERMABOND ADVANCED .7 DNX12 (GAUZE/BANDAGES/DRESSINGS) ×2 IMPLANT
DRAPE HUG U DISPOSABLE (DRAPE) ×3 IMPLANT
DRAPE LG THREE QUARTER DISP (DRAPES) ×6 IMPLANT
DRAPE MONITOR DA VINCI (DRAPE) ×3 IMPLANT
DRAPE WARM FLUID 44X44 (DRAPE) ×3 IMPLANT
ELECT REM PT RETURN 9FT ADLT (ELECTROSURGICAL) ×3
ELECTRODE REM PT RTRN 9FT ADLT (ELECTROSURGICAL) ×2 IMPLANT
EVACUATOR SMOKE 8.L (FILTER) ×3 IMPLANT
GAUZE VASELINE 3X9 (GAUZE/BANDAGES/DRESSINGS) IMPLANT
GLOVE BIO SURGEON STRL SZ 6.5 (GLOVE) ×9 IMPLANT
GLOVE BIOGEL PI IND STRL 6.5 (GLOVE) ×2 IMPLANT
GLOVE BIOGEL PI IND STRL 7.5 (GLOVE) ×4 IMPLANT
GLOVE BIOGEL PI INDICATOR 6.5 (GLOVE) ×1
GLOVE BIOGEL PI INDICATOR 7.5 (GLOVE) ×2
GLOVE ECLIPSE 6.0 STRL STRAW (GLOVE) ×15 IMPLANT
GLOVE ECLIPSE 6.5 STRL STRAW (GLOVE) ×12 IMPLANT
GLOVE ECLIPSE 7.0 STRL STRAW (GLOVE) ×9 IMPLANT
GOWN STRL REIN XL XLG (GOWN DISPOSABLE) ×27 IMPLANT
KIT ACCESSORY DA VINCI DISP (KITS)
KIT ACCESSORY DVNC DISP (KITS) IMPLANT
KIT DISP ACCESSORY 4 ARM (KITS) ×3 IMPLANT
MANIPULATOR UTERINE 4.5 ZUMI (MISCELLANEOUS) IMPLANT
NEEDLE HYPO 22GX1.5 SAFETY (NEEDLE) ×3 IMPLANT
NEEDLE INSUFFLATION 14GA 120MM (NEEDLE) ×3 IMPLANT
NEEDLE SPNL 18GX3.5 QUINCKE PK (NEEDLE) IMPLANT
NS IRRIG 1000ML POUR BTL (IV SOLUTION) ×9 IMPLANT
OCCLUDER COLPOPNEUMO (BALLOONS) ×6 IMPLANT
PACK LAVH (CUSTOM PROCEDURE TRAY) ×3 IMPLANT
PAD PREP 24X48 CUFFED NSTRL (MISCELLANEOUS) ×6 IMPLANT
PLUG CATH AND CAP STER (CATHETERS) ×3 IMPLANT
POSITIONER SURGICAL ARM (MISCELLANEOUS) ×6 IMPLANT
PROTECTOR NERVE ULNAR (MISCELLANEOUS) ×6 IMPLANT
SET CYSTO W/LG BORE CLAMP LF (SET/KITS/TRAYS/PACK) IMPLANT
SET IRRIG TUBING LAPAROSCOPIC (IRRIGATION / IRRIGATOR) ×3 IMPLANT
SOLUTION ELECTROLUBE (MISCELLANEOUS) ×3 IMPLANT
SPONGE LAP 18X18 X RAY DECT (DISPOSABLE) IMPLANT
STRIP CLOSURE SKIN 1/2X4 (GAUZE/BANDAGES/DRESSINGS) ×3 IMPLANT
SUT VIC AB 0 CT1 27 (SUTURE) ×13
SUT VIC AB 0 CT1 27XBRD ANBCTR (SUTURE) ×10 IMPLANT
SUT VIC AB 0 CT1 27XBRD ANTBC (SUTURE) ×8 IMPLANT
SUT VIC AB 2-0 CT2 27 (SUTURE) IMPLANT
SUT VICRYL 0 UR6 27IN ABS (SUTURE) ×6 IMPLANT
SUT VICRYL RAPIDE 4/0 PS 2 (SUTURE) ×6 IMPLANT
SUT VLOC 180 0 9IN  GS21 (SUTURE)
SUT VLOC 180 0 9IN GS21 (SUTURE) IMPLANT
SYR 30ML LL (SYRINGE) ×3 IMPLANT
SYR 50ML LL SCALE MARK (SYRINGE) ×3 IMPLANT
SYSTEM CONVERTIBLE TROCAR (TROCAR) IMPLANT
TIP UTERINE 5.1X6CM LAV DISP (MISCELLANEOUS) IMPLANT
TIP UTERINE 6.7X10CM GRN DISP (MISCELLANEOUS) ×3 IMPLANT
TIP UTERINE 6.7X6CM WHT DISP (MISCELLANEOUS) IMPLANT
TIP UTERINE 6.7X8CM BLUE DISP (MISCELLANEOUS) IMPLANT
TOWEL OR 17X24 6PK STRL BLUE (TOWEL DISPOSABLE) ×6 IMPLANT
TROCAR DISP BLADELESS 8 DVNC (TROCAR) ×2 IMPLANT
TROCAR DISP BLADELESS 8MM (TROCAR) ×1
TROCAR XCEL 12X100 BLDLESS (ENDOMECHANICALS) ×3 IMPLANT
TROCAR Z-THREAD 12X150 (TROCAR) ×3 IMPLANT
TROCAR Z-THREAD BLADED 12X100M (TROCAR) IMPLANT
TUBING FILTER THERMOFLATOR (ELECTROSURGICAL) ×3 IMPLANT

## 2011-05-16 NOTE — Anesthesia Postprocedure Evaluation (Signed)
Anesthesia Post Note  [name]  Procedure(s) Performed: Procedure(s) (LRB): ROBOTIC ASSISTED TOTAL HYSTERECTOMY (N/A) UNILATERAL SALPINGECTOMY (Right)  Anesthesia type: GA  Patient location: PACU  Post pain: Pain level controlled  Post assessment: Post-op Vital signs reviewed  Last Vitals:  Filed Vitals:   05/16/11 1016  BP:   Pulse: 79  Temp:   Resp: 20    Post vital signs: Reviewed  Level of consciousness: sedated  Complications: No apparent anesthesia complications

## 2011-05-16 NOTE — Anesthesia Procedure Notes (Signed)
Procedure Name: Intubation Date/Time: 05/16/2011 7:48 AM Performed by: Lincoln Brigham Pre-anesthesia Checklist: Patient identified, Patient being monitored, Emergency Drugs available, Timeout performed and Suction available Patient Re-evaluated:Patient Re-evaluated prior to inductionOxygen Delivery Method: Circle system utilized Preoxygenation: Pre-oxygenation with 100% oxygen Intubation Type: IV induction Ventilation: Mask ventilation without difficulty Laryngoscope Size: Miller and 2 Grade View: Grade I Tube type: Oral Laser Tube: Cuffed inflated with minimal occlusive pressure - saline Tube size: 7.0 mm Number of attempts: 1 Airway Equipment and Method: Stylet Placement Confirmation: ETT inserted through vocal cords under direct vision,  positive ETCO2 and breath sounds checked- equal and bilateral Secured at: 21 cm Tube secured with: Tape Dental Injury: Teeth and Oropharynx as per pre-operative assessment

## 2011-05-16 NOTE — Anesthesia Postprocedure Evaluation (Signed)
  Anesthesia Post-op Note  [name]  Procedure(s) Performed: Procedure(s) (LRB): ROBOTIC ASSISTED TOTAL HYSTERECTOMY (N/A) UNILATERAL SALPINGECTOMY (Right)  Patient Location: Women's Unit  Anesthesia Type: General  Level of Consciousness: alert  and oriented  Airway and Oxygen Therapy: Patient Spontanous Breathing  Post-op Pain: mild  Post-op Assessment: Patient's Cardiovascular Status Stable and Respiratory Function Stable  Post-op Vital Signs: stable  Complications: No apparent anesthesia complications

## 2011-05-16 NOTE — Transfer of Care (Signed)
Immediate Anesthesia Transfer of Care Note  [name]  Procedure(s) Performed: Procedure(s) (LRB): ROBOTIC ASSISTED TOTAL HYSTERECTOMY (N/A) UNILATERAL SALPINGECTOMY (Right)  Patient Location: PACU  Anesthesia Type: General  Level of Consciousness: awake, alert  and oriented  Airway & Oxygen Therapy: Patient Spontanous Breathing and Patient connected to nasal cannula oxygen  Post-op Assessment: Report given to PACU RN and Post -op Vital signs reviewed and stable  Post vital signs: Reviewed and stable  Complications: No apparent anesthesia complications

## 2011-05-16 NOTE — Anesthesia Preprocedure Evaluation (Addendum)
Anesthesia Evaluation  Patient identified by MRN, date of birth, ID band Patient awake    Reviewed: Allergy & Precautions, H&P , Patient's Chart, lab work & pertinent test results, reviewed documented beta blocker date and time   Airway Mallampati: II TM Distance: >3 FB Neck ROM: full    Dental No notable dental hx. (+) Dental Advisory Given,    Pulmonary  breath sounds clear to auscultation  Pulmonary exam normal       Cardiovascular Rhythm:regular Rate:Normal     Neuro/Psych    GI/Hepatic Controlled,  Endo/Other    Renal/GU      Musculoskeletal   Abdominal   Peds  Hematology   Anesthesia Other Findings   Reproductive/Obstetrics                          Anesthesia Physical Anesthesia Plan  ASA: II  Anesthesia Plan: General   Post-op Pain Management:    Induction: Intravenous  Airway Management Planned: Oral ETT  Additional Equipment:   Intra-op Plan:   Post-operative Plan:   Informed Consent: I have reviewed the patients History and Physical, chart, labs and discussed the procedure including the risks, benefits and alternatives for the proposed anesthesia with the patient or authorized representative who has indicated his/her understanding and acceptance.   Dental Advisory Given and Dental advisory given  Plan Discussed with: CRNA and Surgeon  Anesthesia Plan Comments: (  Discussed  general anesthesia, including possible nausea, instrumentation of airway, sore throat,pulmonary aspiration, etc. I asked if the were any outstanding questions, or  concerns before we proceeded. )        Anesthesia Quick Evaluation

## 2011-05-16 NOTE — H&P (Signed)
Deborah Haney is an 46 y.o. female. She has had long heavy periods for more than 5 years. Fibroids have been seen on u/s. She tried OCPs without success. In fact, her HBG dropped 3 grams while on OCPs.  Pertinent Gynecological History: Menses: flow is excessive with use of 8 pads or tampons on heaviest days Bleeding: dysfunctional uterine bleeding Contraception: OCPs DES exposure: unknown Blood transfusions: none Sexually transmitted diseases: past history: GC and CT about a year ago Previous GYN Procedures: embx negative 2012  Last mammogram: normal Date: 10/2009 Last pap: normal Date: 12/12 OB History: G2, P0A1 ectopic 1   Menstrual History: Menarche age: 89 No LMP recorded.    Past Medical History  Diagnosis Date  . Anemia     has history, takes iron  . Headache   . Diverticulitis     no problems  . GERD (gastroesophageal reflux disease)     tums prn    Past Surgical History  Procedure Date  . Ectopic pregnancy surgery     laparotomy    Family History  Problem Relation Age of Onset  . Diabetes Mother   . Arthritis Father     Social History:  reports that she has quit smoking. Her smoking use included Cigarettes. She has never used smokeless tobacco. She reports that she uses illicit drugs (Cocaine). She reports that she does not drink alcohol.  Allergies: No Known Allergies  Prescriptions prior to admission  Medication Sig Dispense Refill  . Ascorbic Acid (VITAMIN C PO) Take 1 tablet by mouth every 30 (thirty) days.      . ferrous sulfate 325 (65 FE) MG tablet Take 325 mg by mouth daily with breakfast. Takes only when she is menstruating      . hydrocortisone cream 0.5 % Apply 1 application topically 2 (two) times daily as needed. For itching      . ibuprofen (ADVIL,MOTRIN) 200 MG tablet Take 400-600 mg by mouth 2 (two) times daily as needed. For headache and cramping      . Norgestimate-Ethinyl Estradiol Triphasic (ORTHO TRI-CYCLEN,TRINESSA,TRI-SPRINTEC)  0.18/0.215/0.25 MG-35 MCG tablet Take 1 tablet by mouth daily.  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 She has bloody stool intermittently.  Blood pressure 130/88, pulse 75, temperature 98.1 F (36.7 C), temperature source Oral, resp. rate 16, SpO2 100.00%. Physical Exam Heart-rrr Lungs- CTAB Abd- benign, uterus palpable from abd exam about 12 week size  Results for orders placed during the hospital encounter of 05/16/11 (from the past 24 hour(s))  PREGNANCY, URINE     Status: Normal   Collection Time   05/16/11  6:21 AM      Component Value Range   Preg Test, Ur NEGATIVE  NEGATIVE     No results found.  Assessment/Plan: Menorrhagia, fibroids, anemia- We have discussed various approaches. I will do a RATH, removal of both oviducts, and cystoscopy. She is aware that I may need to convert to a laparatomy. She is also aware of the steep Trendelenburg position, risks to bowel, bladder, and ureter. All questions were answered and consents were signed.  Miu Chiong C. 05/16/2011, 7:11 AM

## 2011-05-16 NOTE — Discharge Instructions (Signed)
Hysterectomy Information  A hysterectomy is a procedure where your uterus is surgically removed. It will no longer be possible to have menstrual periods or to become pregnant. The tubes and ovaries can be removed (bilateral salpingo-oopherectomy) during this surgery as well.  REASONS FOR A HYSTERECTOMY  Persistent, abnormal bleeding.   Lasting (chronic) pelvic pain or infection.   The lining of the uterus (endometrium) starts growing outside the uterus (endometriosis).   The endometrium starts growing in the muscle of the uterus (adenomyosis).   The uterus falls down into the vagina (pelvic organ prolapse).   Symptomatic uterine fibroids.   Precancerous cells.   Cervical cancer or uterine cancer.  TYPES OF HYSTERECTOMIES  Supracervical hysterectomy. This type removes the top part of the uterus, but not the cervix.   Total hysterectomy. This type removes the uterus and cervix.   Radical hysterectomy. This type removes the uterus, cervix, and the fibrous tissue that holds the uterus in place in the pelvis (parametrium).  WAYS A HYSTERECTOMY CAN BE PERFORMED  Abdominal hysterectomy. A large surgical cut (incision) is made in the abdomen. The uterus is removed through this incision.   Vaginal hysterectomy. An incision is made in the vagina. The uterus is removed through this incision. There are no abdominal incisions.   Conventional laparoscopic hysterectomy. A thin, lighted tube with a camera (laparoscope) is inserted into 3 or 4 small incisions in the abdomen. The uterus is cut into small pieces. The small pieces are removed through the incisions, or they are removed through the vagina.   Laparoscopic assisted vaginal hysterectomy (LAVH). Three or four small incisions are made in the abdomen. Part of the surgery is performed laparoscopically and part vaginally. The uterus is removed through the vagina.   Robot-assisted laparoscopic hysterectomy. A laparoscope is inserted into 3 or 4  small incisions in the abdomen. A computer-controlled device is used to give the surgeon a 3D image. This allows for more precise movements of surgical instruments. The uterus is cut into small pieces and removed through the incisions or removed through the vagina.  RISKS OF HYSTERECTOMY   Bleeding and risk of blood transfusion. Tell your caregiver if you do not want to receive any blood products.   Blood clots in the legs or lung.   Infection.   Injury to surrounding organs.   Anesthesia problems or side effects.   Conversion to an abdominal hysterectomy.  WHAT TO EXPECT AFTER A HYSTERECTOMY  You will be given pain medicine.   You will need to have someone with you for the first 3 to 5 days after you go home.   You will need to follow up with your surgeon in 2 to 4 weeks after surgery to evaluate your progress.   You may have early menopause symptoms like hot flashes, night sweats, and insomnia.   If you had a hysterectomy for a problem that was not a cancer or a condition that could lead to cancer, then you no longer need Pap tests. However, even if you no longer need a Pap test, a regular exam is a good idea to make sure no other problems are starting.  Document Released: 08/17/2000 Document Revised: 02/10/2011 Document Reviewed: 10/02/2010 [facility] Patient Information 2012 [place], [place].   NOTHING PER VAGINA FOR 8 WEEKS!

## 2011-05-16 NOTE — Op Note (Signed)
05/16/2011  10:06 AM  PATIENT:  Deborah Haney  46 y.o. female  PRE-OPERATIVE DIAGNOSIS:  Symptomatic fibroids, menorrhagia, anemia  POST-OPERATIVE DIAGNOSIS:  same  PROCEDURE:  Procedure(s) (LRB): ROBOTIC ASSISTED TOTAL HYSTERECTOMY (N/A) UNILATERAL SALPINGECTOMY (Right) CYSTOSCOPY  SURGEON:  Surgeon(s) and Role:    * Allie Bossier, MD - Primary    * Catalina Antigua, MD - Assisting  PHYSICIAN ASSISTANT:   ASSISTANTS: none   ANESTHESIA:   general  EBL:  Total I/O In: 1300 [I.V.:1000; Other:300] Out: 500 [Urine:450; Blood:50]  BLOOD ADMINISTERED:none  DRAINS: none   LOCAL MEDICATIONS USED:  OTHER 60 mL Ropivicaine  SPECIMEN:  Source of Specimen:  uterus, right oviduct  DISPOSITION OF SPECIMEN:  PATHOLOGY  COUNTS:  YES  TOURNIQUET:  * No tourniquets in log *  DICTATION: .Dragon Dictation  PLAN OF CARE: outpatient with extended recovery  PATIENT DISPOSITION:  PACU - hemodynamically stable.   Delay start of Pharmacological VTE agent (>24hrs) due to surgical blood loss or risk of bleeding: not applicable  The risks, benefits, and alternatives of surgery were explained, understood, and accepted. All questions were answered. Consents were signed. She was taken to the operating room and placed in the dorsal lithotomy position. When she reported or self to be comfortable, general anesthesia was applied without difficulty. Her arms were carefully tucked and her head was carefully dissected. Her abdomen and vagina were prepped and draped in the usual sterile fashion. A bimanual exam revealed a 14 week size mobile uterus. A uterine manipulator was placed after measuring the uterine cavity to be 10 cm and dilating the cervix to accommodate the Rumi uterine manipulator. A paracervical block was done with 20 mL of dilute ropivacaine solution. A Foley catheter was placed, and it drained clear urine throughout this case. Gloves were changed, and attention was given to the abdomen.  Prior to any incisions, dilute ropivacaine was injected. A supra-umbilical vertical incision was made, and a varies needle was placed. Low-flow CO2 was used to insufflate the abdomen to approximately 15 liters. The pelvis was inspected in an alpha manner. The uterus was greatly enlarged, do to the fibroids. It was mobile. The left oviduct was nonexistent, probably because of her previous left ectopic. The right oviduct appeared normal and both ovaries appeared normal. At this point a 12 mm assistant port was placed under direct laparoscopic visualization. An 8 mm robotic port was placed approximately 10 cm lateral to the camera port on each side, under direct laparoscopic visualization. Once the robot was docked, I proceeded to work at the consult. The utero-ovarian ligaments were each clamped and cauterized with the P K device, yielding excellent hemostasis. Both round ligaments were identified and cauterized and ligated. Please note that the position of the ureters was identified at the beginning of the case. A bladder flap was created anteriorly. Both uterine vessels were identified and cauterized. A colpotomy was made anteriorly. A colpotomy incision was carried circumferentially beneath the uterus. Excellent hemostasis was noted at the cuff. Because of the size of the uterus, I had to remove one of the large anterior fibroids with the cautery device in order to be able to pull the uterus out through the vagina. I did have to morcellate the uterus from the vagina. The separate fibroid was then removed from the vagina. Once more at the console, I used 0 Vicryl sutures to placed 4 figure-of-eight sutures of the vaginal cuff,yielding excellent hemostasis. All pedicles were noted be hemostatic. I did remove the  oviduct on the right and removed through the assistant port. At this point I did a cystoscopy and saw indigo carmine coming from each ureter. Please note that laparoscopically the ureters were functioning  well on each side and appeared of normal caliber. We used an Endoloop closure to close the 12 mm system port. The supraumbilical port was closed with a figure-of-eight 0 Vicryl suture the subcuticular closures were all done with 4-0 Vicryl suture. She tolerated the procedure well and was taken to recovery room in stable condition.

## 2011-05-18 ENCOUNTER — Telehealth: Payer: Self-pay | Admitting: *Deleted

## 2011-05-18 NOTE — Telephone Encounter (Signed)
Pt left message stating that she had surgery on 3/11 and has questions- c/o SOB and wants to know when she can take a shower or bath.  I returned pt call and discussed her concerns. I told her that I will need to consult w/Dr. Marice Potter and call her back later today.

## 2011-05-18 NOTE — Telephone Encounter (Signed)
Dr. Marice Potter called pt and answered her questions.

## 2011-05-26 ENCOUNTER — Telehealth: Payer: Self-pay | Admitting: Obstetrics and Gynecology

## 2011-05-26 NOTE — Telephone Encounter (Signed)
Left a message for patient to call us back in regard of her message left earlier about questions she have in regard of a laparoscopy performed 05/16/2011.

## 2011-05-26 NOTE — Telephone Encounter (Signed)
Patient called with questions about whether or not she can drive post laparoscopy 9/60/45 and that she finds blood in her stools from the last 3 BM. Advised patient that she needs to get a post-op follow up appt first and then she'll get clearance then on whether she can start driving or not. Ok per [employer]. She is also to monitor blood in stools and will discuss at time of follow up visit. If she start to see worsening symptoms such as increasing weakness, hardened abdomen, or increase in blood in stools to get immediate help. Patient agrees.

## 2011-05-30 ENCOUNTER — Telehealth: Payer: Self-pay | Admitting: *Deleted

## 2011-05-30 NOTE — Telephone Encounter (Signed)
Called pt and offered appt on 06/02/11 @ 1615 to see Dr. Marice Potter. Pt agreed.

## 2011-05-30 NOTE — Telephone Encounter (Signed)
I left message stating she would like a note from Dr. Marice Potter stating that she may return to work on light duty.  I returned pt's call and informed her that she will need to have post op evaluation by Dr. Marice Potter prior to returning to work. 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 I told pt that I will check to see if she can be scheduled for an appt sooner than 4/26. I will call her back once I have information. Pt voiced understanding.

## 2011-06-01 ENCOUNTER — Ambulatory Visit (INDEPENDENT_AMBULATORY_CARE_PROVIDER_SITE_OTHER): Payer: Self-pay | Admitting: Obstetrics & Gynecology

## 2011-06-01 ENCOUNTER — Encounter: Payer: Self-pay | Admitting: Obstetrics & Gynecology

## 2011-06-01 DIAGNOSIS — Z09 Encounter for follow-up examination after completed treatment for conditions other than malignant neoplasm: Secondary | ICD-10-CM

## 2011-06-01 NOTE — Progress Notes (Signed)
  Subjective:    Patient ID: Deborah Haney, female    DOB: [birth year], 46 y.o.   MRN: [id number]  HPI  She is now 2 1/2 weeks post op status post RATH. Her pathol showed a 452 gram uterus and adenomyosis and fibroids. 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 She has not and won't have sex until her 8 weeks post op check  Review of Systems     Objective:   Physical Exam  Incisions healed well      Assessment & Plan:  Post op doing well RTC 6 weeks.

## 2011-06-10 ENCOUNTER — Telehealth: Payer: Self-pay | Admitting: *Deleted

## 2011-06-10 NOTE — Telephone Encounter (Signed)
Pt left message stating that she has vaginal d/c. She had robot hysterectomy on 05/16/11. I returned her call and left a message that we will need more details about the discharge. Please call back and leave a detailed message so that we can advise you.

## 2011-06-10 NOTE — Telephone Encounter (Signed)
Patient called at 8:55 and left another message stating her vaginal discharge is heavy, not constant and has a strong acidic smell and has had it for 2 weeks.

## 2011-06-10 NOTE — Telephone Encounter (Signed)
I called pt and left message on her personal voice mail that she will require evaluation of the d/c prior to treatment. The problem is not urgent and is not considered to be related to her surgery.  (**Note: pt saw Dr. Marice Potter in our office on 06/01/11 and did not report this d/c)  I will call her next week with a work-in appt or she may go to [facility] for evaluation if she does not want to wait.

## 2011-06-13 NOTE — Telephone Encounter (Signed)
Called pt and left message that she has been scheduled to see Dr. Marice Potter on 06/16/11 @ 1415 to evaluate the vaginal d/c. If she no longer needs the appt, please call to cancel.

## 2011-06-16 ENCOUNTER — Encounter: Payer: Self-pay | Admitting: Obstetrics & Gynecology

## 2011-06-16 ENCOUNTER — Ambulatory Visit (INDEPENDENT_AMBULATORY_CARE_PROVIDER_SITE_OTHER): Payer: Self-pay | Admitting: Obstetrics & Gynecology

## 2011-06-16 VITALS — BP 148/73 | HR 73 | Temp 97.4°F | Ht 66.0 in | Wt 148.5 lb

## 2011-06-16 DIAGNOSIS — N898 Other specified noninflammatory disorders of vagina: Secondary | ICD-10-CM

## 2011-06-16 MED ORDER — METRONIDAZOLE 500 MG PO TABS: 500.0000 mg | ORAL_TABLET | Freq: Three times a day (TID) | ORAL | Status: AC

## 2011-06-16 NOTE — Progress Notes (Signed)
  Subjective:    Patient ID: Deborah Haney, female    DOB: [birth year], 46 y.o.   MRN: [id number]  HPI  Ms. Alonge is 4 weeks post op status post RATH. She went back to work about 1 week post op and has no post op problems, just a vaginal discharge. She denies intercourse.  Review of Systems     Objective:   Physical Exam  Frothy white discharge consistent with BV      Assessment & Plan:  Probable BV- I will send a wet prep but go ahead and treat her with Flagyl.

## 2011-06-17 LAB — WET PREP, GENITAL
Trich, Wet Prep: NONE SEEN
Yeast Wet Prep HPF POC: NONE SEEN

## 2011-07-01 ENCOUNTER — Ambulatory Visit: Payer: Self-pay | Admitting: Obstetrics & Gynecology

## 2011-07-08 ENCOUNTER — Ambulatory Visit (INDEPENDENT_AMBULATORY_CARE_PROVIDER_SITE_OTHER): Payer: Self-pay | Admitting: Obstetrics & Gynecology

## 2011-07-08 ENCOUNTER — Encounter: Payer: Self-pay | Admitting: Obstetrics & Gynecology

## 2011-07-08 DIAGNOSIS — Z09 Encounter for follow-up examination after completed treatment for conditions other than malignant neoplasm: Secondary | ICD-10-CM

## 2011-07-08 NOTE — Progress Notes (Signed)
  Subjective:    Patient ID: Deborah Haney, female    DOB: 1965-05-08, 46 y.o.   MRN: [id number]  HPI  Deborah Haney is now 9 weeks post op (RATH). Her only complaint is that of a lot of work-related stress. She works for a [employer] and they are upset and have suspended her because she told them she had sex (against her contract). She reports that the IC was orgasmic, no painful, and no bleeding.  Review of Systems     Objective:   Physical Exam Small amount of granulation tissue at the left edge of cuff (treated today with silver nitrate)      Assessment & Plan:  Post op stable RTC 1 year or prn sooner

## 2011-10-21 ENCOUNTER — Encounter: Payer: Self-pay | Admitting: Obstetrics & Gynecology

## 2011-10-21 ENCOUNTER — Ambulatory Visit (INDEPENDENT_AMBULATORY_CARE_PROVIDER_SITE_OTHER): Payer: Self-pay | Admitting: Obstetrics & Gynecology

## 2011-10-21 VITALS — BP 137/87 | HR 75 | Temp 97.1°F | Ht 66.0 in | Wt 151.3 lb

## 2011-10-21 DIAGNOSIS — L293 Anogenital pruritus, unspecified: Secondary | ICD-10-CM

## 2011-10-21 DIAGNOSIS — N898 Other specified noninflammatory disorders of vagina: Secondary | ICD-10-CM

## 2011-10-21 NOTE — Progress Notes (Signed)
Subjective:     Patient ID: Deborah Haney, female   DOB: 1965/03/13, 46 y.o.   MRN: [id number]  HPI Pt presents with c/o vulvar itching.  Pt worries that her partner is cheating on her.  She was screened for STI's at the health dept 1 week ago and has not been called with any results.  Pt c/o vaginal dryness as well. She is not sure if this proceeded the infidelity.  She is most concerned with the risk of an STI  Pt douched just before this exam.   Review of Systems n/c    Objective:   Physical ExamPt in NAD GU: EGBUS: no lesions Vagina: no blood in vault; vaginal tissues sl swollen, no dryness but there was LOTS of clear water like fluid (pt douched just before the exam)   Cervix: no lesion; no mucopurulent d/c      Assessment:    Vaginal itching- normal exam    Plan:     F/u records from Health Dept F/u sooner prn D/w with not to douche ESPECIALLY just before her physician GYN visit.  Sander Speckman L. Harraway-Smith, M.D., [place]

## 2011-10-21 NOTE — Patient Instructions (Signed)
Vulvar hygiene:  Avoid perfumed soaps, detergents, lotions and ANY type of douches; in addition to wearing cotton underwear and no underwear at night.

## 2012-02-10 ENCOUNTER — Emergency Department (INDEPENDENT_AMBULATORY_CARE_PROVIDER_SITE_OTHER)
Admission: EM | Admit: 2012-02-10 | Discharge: 2012-02-10 | Disposition: A | Discharge status: Nursing Home (Includes ICF) | Payer: Self-pay | Source: Home / Self Care | Attending: Family Medicine | Admitting: Family Medicine

## 2012-02-10 ENCOUNTER — Encounter (HOSPITAL_COMMUNITY): Payer: Self-pay | Admitting: Emergency Medicine

## 2012-02-10 ENCOUNTER — Emergency Department (HOSPITAL_COMMUNITY)
Admission: EM | Admit: 2012-02-10 | Discharge: 2012-02-10 | Disposition: A | Discharge status: Home / Self Care | Payer: Self-pay | Attending: Emergency Medicine | Admitting: Emergency Medicine

## 2012-02-10 ENCOUNTER — Encounter (HOSPITAL_COMMUNITY): Payer: Self-pay | Admitting: Family Medicine

## 2012-02-10 DIAGNOSIS — K625 Hemorrhage of anus and rectum: Secondary | ICD-10-CM | POA: Insufficient documentation

## 2012-02-10 DIAGNOSIS — K922 Gastrointestinal hemorrhage, unspecified: Secondary | ICD-10-CM

## 2012-02-10 DIAGNOSIS — D649 Anemia, unspecified: Secondary | ICD-10-CM | POA: Insufficient documentation

## 2012-02-10 DIAGNOSIS — Z8719 Personal history of other diseases of the digestive system: Secondary | ICD-10-CM | POA: Insufficient documentation

## 2012-02-10 DIAGNOSIS — Z87891 Personal history of nicotine dependence: Secondary | ICD-10-CM | POA: Insufficient documentation

## 2012-02-10 HISTORY — DX: Unspecified hemorrhoids: K64.9

## 2012-02-10 LAB — BASIC METABOLIC PANEL
BUN: 13 mg/dL (ref 6–23)
CO2: 26 mEq/L (ref 19–32)
Calcium: 9.2 mg/dL (ref 8.4–10.5)
Chloride: 104 mEq/L (ref 96–112)
Creatinine, Ser: 0.71 mg/dL (ref 0.50–1.10)
GFR calc Af Amer: >90 mL/min (ref >90)
GFR calc non Af Amer: >90 mL/min (ref >90)
Glucose, Bld: 181 mg/dL — ABNORMAL HIGH (ref 70–99)
Potassium: 4.2 mEq/L (ref 3.5–5.1)
Sodium: 138 mEq/L (ref 135–145)

## 2012-02-10 LAB — CBC WITH DIFFERENTIAL/PLATELET
Basophils Absolute: 0 10*3/uL (ref 0.0–0.1)
Basophils Relative: 0 % (ref 0–1)
Eosinophils Absolute: 0.1 10*3/uL (ref 0.0–0.7)
Eosinophils Relative: 2 % (ref 0–5)
HCT: 35.7 % — ABNORMAL LOW (ref 36.0–46.0)
Hemoglobin: 12.1 g/dL (ref 12.0–15.0)
Lymphocytes Relative: 32 % (ref 12–46)
Lymphs Abs: 2.1 10*3/uL (ref 0.7–4.0)
MCH: 30 pg (ref 26.0–34.0)
MCHC: 33.9 g/dL (ref 30.0–36.0)
MCV: 88.4 fL (ref 78.0–100.0)
Monocytes Absolute: 0.3 10*3/uL (ref 0.1–1.0)
Monocytes Relative: 5 % (ref 3–12)
Neutro Abs: 4 10*3/uL (ref 1.7–7.7)
Neutrophils Relative %: 61 % (ref 43–77)
Platelets: 227 10*3/uL (ref 150–400)
RBC: 4.04 MIL/uL (ref 3.87–5.11)
RDW: 14.8 % (ref 11.5–15.5)
WBC: 6.5 10*3/uL (ref 4.0–10.5)

## 2012-02-10 LAB — PROTIME-INR
INR: 0.97 (ref 0.00–1.49)
Prothrombin Time: 12.8 seconds (ref 11.6–15.2)

## 2012-02-10 LAB — CBC
HCT: 37.5 % (ref 36.0–46.0)
Hemoglobin: 12.4 g/dL (ref 12.0–15.0)
MCH: 29.4 pg (ref 26.0–34.0)
MCHC: 33.1 g/dL (ref 30.0–36.0)
MCV: 88.9 fL (ref 78.0–100.0)
Platelets: 256 10*3/uL (ref 150–400)
RBC: 4.22 MIL/uL (ref 3.87–5.11)
RDW: 14.8 % (ref 11.5–15.5)
WBC: 6.6 10*3/uL (ref 4.0–10.5)

## 2012-02-10 LAB — URINALYSIS, ROUTINE W REFLEX MICROSCOPIC
Bilirubin Urine: NEGATIVE
Glucose, UA: NEGATIVE mg/dL
Hgb urine dipstick: NEGATIVE
Ketones, ur: NEGATIVE mg/dL
Leukocytes, UA: NEGATIVE
Nitrite: NEGATIVE
Protein, ur: NEGATIVE mg/dL
Specific Gravity, Urine: 1.023 (ref 1.005–1.030)
Urobilinogen, UA: 1 mg/dL (ref 0.0–1.0)
pH: 6.5 (ref 5.0–8.0)

## 2012-02-10 LAB — OCCULT BLOOD, POC DEVICE
Fecal Occult Bld: POSITIVE
Fecal Occult Bld: NEGATIVE

## 2012-02-10 NOTE — ED Notes (Signed)
Pt sent down from [facility] with positive blood in stool. sts nausea but denies abdominal pain.

## 2012-02-10 NOTE — ED Provider Notes (Signed)
History     CSN: [id number]  Arrival date & time 02/10/12  4098   First MD Initiated Contact with Patient 02/10/12 1103      Chief Complaint  Patient presents with  . Rectal Bleeding    (Consider location/radiation/quality/duration/timing/severity/associated sxs/prior treatment) Patient is a 46 y.o. female presenting with hematochezia. The history is provided by the patient.  Rectal Bleeding  The current episode started more than 2 weeks ago (present for 3 mo, worse past 3 days with blood from rectum with urination, has chronic constipation issuesm diverticulosis, no pain, no hemorrhoids.). The onset was gradual. The problem occurs occasionally. The problem has been gradually worsening. The pain is mild. The stool is described as hard and mixed with blood. Prior successful therapies include fiber. Pertinent negatives include no nausea and no vomiting.    Past Medical History  Diagnosis Date  . Anemia     has history, takes iron  . Headache   . Diverticulitis     no problems  . GERD (gastroesophageal reflux disease)     tums prn  . Hemorrhoids     Past Surgical History  Procedure Date  . Ectopic pregnancy surgery     laparotomy    Family History  Problem Relation Age of Onset  . Diabetes Mother   . Arthritis Father     History  Substance Use Topics  . Smoking status: Former Smoker    Types: Cigarettes  . Smokeless tobacco: Never Used  . Alcohol Use: No    OB History    Grav Para Term Preterm Abortions TAB SAB Ect Mult Living   1 0 0 0 1 0 0 1 0 0       Review of Systems  Constitutional: Negative.   Gastrointestinal: Positive for constipation, blood in stool, hematochezia and abdominal distention. Negative for nausea, vomiting and anal bleeding.    Allergies  Review of patient's allergies indicates no known allergies.  Home Medications   Current Outpatient Rx  Name  Route  Sig  Dispense  Refill  . IBUPROFEN 200 MG PO TABS   Oral   Take 400 mg by  mouth every 6 (six) hours as needed. For pain           BP 174/90  Pulse 72  Temp 99.8 F (37.7 C) (Oral)  Resp 18  SpO2 100%  LMP 04/25/2011  Physical Exam  Nursing note and vitals reviewed. Constitutional: She is oriented to person, place, and time. She appears well-developed and well-nourished.  Abdominal: Soft. Bowel sounds are normal. She exhibits no distension and no mass. There is no hepatosplenomegaly. There is tenderness in the left lower quadrant. There is no rebound, no guarding and no CVA tenderness.  Neurological: She is alert and oriented to person, place, and time.  Skin: Skin is warm and dry.    ED Course  Procedures (including critical care time)   Labs Reviewed  CBC  OCCULT BLOOD, POC DEVICE  LAB REPORT - SCANNED   No results found.   1. Lower GI bleed       MDM          Linna Hoff, MD 02/11/12 [phone]

## 2012-02-10 NOTE — ED Notes (Addendum)
Pt c/o blood in stools x3 months... Sx include: headaches, nauseas... Denies: fevers, vomiting, diarrhea, abd pain, mucous stools ... Has noticed wt gain... She is alert w/no signs of acute distress.... Hx of hemorrhoids and diverticulitis.

## 2012-02-10 NOTE — ED Notes (Signed)
Patient arrived via [facility] shuttle. She states she has been having rectal bleeding since October described as bright red blood. She also states she gets a headache when she strains to have a BM.

## 2012-02-12 NOTE — ED Provider Notes (Signed)
History     CSN: [id number]  Arrival date & time 02/10/12  1313   First MD Initiated Contact with Patient 02/10/12 1842      Chief Complaint  Patient presents with  . Rectal Bleeding    (Consider location/radiation/quality/duration/timing/severity/associated sxs/prior treatment) HPI 46 yo F presents with cc hematochezia. Onset 3 mos ago. Intermittent and painless. At first occurred only with BMs.  Patient has been having some BRPR without bms. No clotting. Amount is approx one tablespoon. Denies hx of hemorrhoids, although it is listed in her EMR/PMH. She has history of constipation and usually makes hard stools twice a week. Last episode of rectal bleeding was 4 days ago. No urinary or vaginal sxs. Denies fevers, chills, myalgias, arthralgias. Denies DOE, SOB, chest tightness or pressure, radiation to left arm, jaw or back, or diaphoresis. Denies dysuria, flank pain, suprapubic pain, frequency, urgency, or hematuria. Denies headaches, light headedness, weakness, visual disturbances. Denies abdominal pain, nausea, vomiting, diarrhea or constipation.    Past Medical History  Diagnosis Date  . Anemia     has history, takes iron  . Headache   . Diverticulitis     no problems  . GERD (gastroesophageal reflux disease)     tums prn  . Hemorrhoids     Past Surgical History  Procedure Date  . Ectopic pregnancy surgery     laparotomy    Family History  Problem Relation Age of Onset  . Diabetes Mother   . Arthritis Father     History  Substance Use Topics  . Smoking status: Former Smoker    Types: Cigarettes  . Smokeless tobacco: Never Used  . Alcohol Use: No    OB History    Grav Para Term Preterm Abortions TAB SAB Ect Mult Living   1 0 0 0 1 0 0 1 0 0       Review of Systems Ten systems are reviewed and are negative for acute change except as noted in the HPI  Allergies  Review of patient's allergies indicates no known allergies.  Home Medications   Current  Outpatient Rx  Name  Route  Sig  Dispense  Refill  . IBUPROFEN 200 MG PO TABS   Oral   Take 400 mg by mouth every 6 (six) hours as needed. For pain           BP 137/89  Pulse 84  Temp 98.2 F (36.8 C)  Resp 18  SpO2 100%  LMP 04/25/2011  Physical Exam Physical Exam  Nursing note and vitals reviewed. Constitutional: She is oriented to person, place, and time. She appears well-developed and well-nourished. No distress.  HENT:  Head: Normocephalic and atraumatic.  Eyes: Conjunctivae normal and EOM are normal. Pupils are equal, round, and reactive to light. No scleral icterus.  Neck: Normal range of motion.  Cardiovascular: Normal rate, regular rhythm and normal heart sounds.  Exam reveals no gallop and no friction rub.   No murmur heard. Pulmonary/Chest: Effort normal and breath sounds normal. No respiratory distress.  Abdominal: Soft. Bowel sounds are normal. She exhibits no distension and no mass. There is no tenderness. There is no guarding.  GU: Digital Rectal Exam reveals sphincter with good tone. Several non-thrombosed external hemorrhoids. No masses or fissures.  No Overt stool or blood present. Hemoccult Negative. Neurological: She is alert and oriented to person, place, and time.  Skin: Skin is warm and dry. She is not diaphoretic.    ED Course  Procedures (including critical care  time)  Labs Reviewed  CBC WITH DIFFERENTIAL - Abnormal; Notable for the following:    HCT 35.7 (*)     All other components within normal limits  BASIC METABOLIC PANEL - Abnormal; Notable for the following:    Glucose, Bld 181 (*)     All other components within normal limits  URINALYSIS, ROUTINE W REFLEX MICROSCOPIC - Abnormal; Notable for the following:    APPearance CLOUDY (*)     All other components within normal limits  PROTIME-INR  OCCULT BLOOD, POC DEVICE  LAB REPORT - SCANNED   No results found.   1. Rectal bleed       MDM  BP 137/89  Pulse 84  Temp 98.2 F (36.8  C)  Resp 18  SpO2 100%  LMP 04/25/2011 VSS. Hx/PE consistent with internal hemorrhoids. Patient normal Hgb.  Patient is given OP fu with GI. She is stable and safe for discharge at this time. Expressed need for colonoscopy to evaluate further. She may return for increased bleeding or symptomatic anemia, N/V/Fever/abdominal pain.       Arthor Captain, PA-C 02/12/12 1011

## 2012-02-12 NOTE — ED Provider Notes (Signed)
Medical screening examination/treatment/procedure(s) were performed by non-physician practitioner and as supervising physician I was immediately available for consultation/collaboration.  Derwood Kaplan, MD 02/12/12 2316

## 2012-06-06 ENCOUNTER — Encounter (HOSPITAL_COMMUNITY): Payer: Self-pay | Admitting: *Deleted

## 2012-06-06 ENCOUNTER — Emergency Department (HOSPITAL_COMMUNITY)
Admission: EM | Admit: 2012-06-06 | Discharge: 2012-06-06 | Disposition: A | Discharge status: Home / Self Care | Payer: Self-pay | Attending: Emergency Medicine | Admitting: Emergency Medicine

## 2012-06-06 ENCOUNTER — Emergency Department (HOSPITAL_COMMUNITY): Payer: Self-pay

## 2012-06-06 DIAGNOSIS — Z87891 Personal history of nicotine dependence: Secondary | ICD-10-CM | POA: Insufficient documentation

## 2012-06-06 DIAGNOSIS — R209 Unspecified disturbances of skin sensation: Secondary | ICD-10-CM | POA: Insufficient documentation

## 2012-06-06 DIAGNOSIS — R51 Headache: Secondary | ICD-10-CM | POA: Insufficient documentation

## 2012-06-06 DIAGNOSIS — Z8719 Personal history of other diseases of the digestive system: Secondary | ICD-10-CM | POA: Insufficient documentation

## 2012-06-06 DIAGNOSIS — Z862 Personal history of diseases of the blood and blood-forming organs and certain disorders involving the immune mechanism: Secondary | ICD-10-CM | POA: Insufficient documentation

## 2012-06-06 DIAGNOSIS — R519 Headache, unspecified: Secondary | ICD-10-CM

## 2012-06-06 DIAGNOSIS — R110 Nausea: Secondary | ICD-10-CM | POA: Insufficient documentation

## 2012-06-06 LAB — POCT I-STAT, CHEM 8
BUN: 10 mg/dL (ref 6–23)
Calcium, Ion: 1.12 mmol/L (ref 1.12–1.23)
Chloride: 106 mEq/L (ref 96–112)
Creatinine, Ser: 0.7 mg/dL (ref 0.50–1.10)
Glucose, Bld: 88 mg/dL (ref 70–99)
HCT: 40 % (ref 36.0–46.0)
Hemoglobin: 13.6 g/dL (ref 12.0–15.0)
Potassium: 4.6 mEq/L (ref 3.5–5.1)
Sodium: 140 mEq/L (ref 135–145)
TCO2: 27 mmol/L (ref 0–100)

## 2012-06-06 MED ORDER — DIPHENHYDRAMINE HCL 50 MG/ML IJ SOLN
50.0000 mg | Freq: Once | INTRAMUSCULAR | Status: AC
  Administered 2012-06-06: 50 mg via INTRAVENOUS
  Filled 2012-06-06: qty 1

## 2012-06-06 MED ORDER — NAPROXEN 375 MG PO TABS: 375.0000 mg | ORAL_TABLET | Freq: Two times a day (BID) | ORAL | Status: DC

## 2012-06-06 MED ORDER — KETOROLAC TROMETHAMINE 30 MG/ML IJ SOLN
30.0000 mg | Freq: Once | INTRAMUSCULAR | Status: AC
  Administered 2012-06-06: 30 mg via INTRAVENOUS
  Filled 2012-06-06: qty 1

## 2012-06-06 MED ORDER — SODIUM CHLORIDE 0.9 % IV SOLN
INTRAVENOUS | Status: DC
  Administered 2012-06-06: 14:00:00 via INTRAVENOUS

## 2012-06-06 MED ORDER — MECLIZINE HCL 25 MG PO TABS
25.0000 mg | ORAL_TABLET | Freq: Once | ORAL | Status: AC
  Administered 2012-06-06: 25 mg via ORAL
  Filled 2012-06-06: qty 1

## 2012-06-06 MED ORDER — METOCLOPRAMIDE HCL 5 MG/ML IJ SOLN
10.0000 mg | Freq: Once | INTRAMUSCULAR | Status: AC
  Administered 2012-06-06: 10 mg via INTRAVENOUS
  Filled 2012-06-06: qty 2

## 2012-06-06 NOTE — ED Notes (Signed)
Pt escorted to discharge window. Pt verbalized understanding discharge instructions. In no acute distress.  

## 2012-06-06 NOTE — ED Notes (Addendum)
Pt reports headache since Monday, constant pain 8/10. Reports slight blurry vision, dizziness to the point where pt did not drive today. sensitivity to light. Pt reports when headache started right sided arm numbness started as well. bil pedal pulses.

## 2012-06-06 NOTE — ED Notes (Signed)
PA at bedside.

## 2012-06-06 NOTE — ED Provider Notes (Signed)
History     CSN: [id number]  Arrival date & time 06/06/12  1209   First MD Initiated Contact with Patient 06/06/12 1228      Chief Complaint  Patient presents with  . Headache  . right arm numbness     (Consider location/radiation/quality/duration/timing/severity/associated sxs/prior treatment) HPI Comments: Deborah Haney is a 47 y.o. female with a history of headaches presents emergency department with chief complaint of headache.  Onset of headache began approximately one month ago and has been gradually worsening.  Patient states these headaches are not like her typical headaches as they're more severe.  His described as being located in the left parietal region throbbing in nature with associated left eye blurred vision, right arm numbness and intermittent dizziness.  Patient states she came in today because her headache woke her up acutely at night at 3 a.m.  Pain is described as moderate 8/10 in severity without radiation.  Patient denies any recent head trauma, photophobia, ataxia, disequilibrium, double vision, vomiting, extremity weakness.  The history is provided by the patient.    Past Medical History  Diagnosis Date  . Anemia     has history, takes iron  . Headache   . Diverticulitis     no problems  . GERD (gastroesophageal reflux disease)     tums prn  . Hemorrhoids     Past Surgical History  Procedure Laterality Date  . Ectopic pregnancy surgery      laparotomy    Family History  Problem Relation Age of Onset  . Diabetes Mother   . Arthritis Father     History  Substance Use Topics  . Smoking status: Former Smoker    Types: Cigarettes  . Smokeless tobacco: Never Used  . Alcohol Use: No    OB History   Grav Para Term Preterm Abortions TAB SAB Ect Mult Living   1 0 0 0 1 0 0 1 0 0       Review of Systems  Constitutional: Positive for activity change. Negative for fever, chills, diaphoresis and fatigue.  HENT: Negative for ear pain, congestion,  facial swelling, neck pain, neck stiffness, sinus pressure and tinnitus.   Eyes: Positive for visual disturbance. Negative for photophobia and redness.  Respiratory: Negative for cough, shortness of breath, wheezing and stridor.   Cardiovascular: Negative for chest pain.  Gastrointestinal: Positive for nausea. Negative for vomiting and abdominal pain.  Musculoskeletal: Negative for myalgias and gait problem.  Skin: Negative for rash.  Neurological: Positive for dizziness and headaches. Negative for syncope, speech difficulty, weakness, light-headedness and numbness.       No bowel or bladder incontinence.  Psychiatric/Behavioral: Negative for confusion.  All other systems reviewed and are negative.    Allergies  Review of patient's allergies indicates no known allergies.  Home Medications   Current Outpatient Rx  Name  Route  Sig  Dispense  Refill  . acetaminophen (TYLENOL) 500 MG tablet   Oral   Take 500 mg by mouth every 6 (six) hours as needed for pain.         Marland Kitchen ibuprofen (ADVIL,MOTRIN) 200 MG tablet   Oral   Take 400 mg by mouth every 6 (six) hours as needed. For pain           BP 137/83  Pulse 64  Temp(Src) 98.1 F (36.7 C) (Oral)  Resp 22  SpO2 100%  LMP 04/25/2011  Physical Exam  Nursing note and vitals reviewed. Constitutional: She is oriented to person,  place, and time. She appears well-developed and well-nourished. She appears distressed.  HENT:  Head: Normocephalic and atraumatic.  Eyes: Conjunctivae and EOM are normal. Pupils are equal, round, and reactive to light. No scleral icterus.  Neck: Normal range of motion.  Neck supple with no nuchal rigidity, full pain free ROM. No carotid bruit  Cardiovascular: Normal rate, regular rhythm, normal heart sounds and intact distal pulses.   Pulmonary/Chest: Effort normal and breath sounds normal. No respiratory distress.  Musculoskeletal: Normal range of motion.  Neurological: She is alert and oriented to  person, place, and time. She has normal strength.  CN III-XII intact, good coordination, normal gait, strength 5/5 bilaterally, intact distal sensation.  Skin: Skin is warm and dry.  No rash, non diaphoretic    ED Course  Procedures (including critical care time)  Labs Reviewed  POCT I-STAT, CHEM 8   Ct Head Wo Contrast  06/06/2012  *RADIOLOGY REPORT*  Clinical Data: 47 year old female left side headache.  Bilateral weakness greater on the right.  CT HEAD WITHOUT CONTRAST  Technique:  Contiguous axial images were obtained from the base of the skull through the vertex without contrast.  Comparison: Head CT without contrast 01/2008.  Findings: Visualized paranasal sinuses and mastoids are clear. Visualized orbits and scalp soft tissues are within normal limits. No acute osseous abnormality identified.  Stable and normal cerebral volume.  No midline shift, ventriculomegaly, mass effect, evidence of mass lesion, intracranial hemorrhage or evidence of cortically based acute infarction.  Gray-white matter differentiation is within normal limits throughout the brain.  No suspicious intracranial vascular hyperdensity.  IMPRESSION: Stable and normal noncontrast CT appearance of the brain.   Original Report Authenticated By: Erskine Speed, M.D.      No diagnosis found.    MDM  HA  Pt HA treated and improved while in ED.  Presentation is slightly worse then pts typical HA as it awoke her from sleep. Imaging ordered and reviewed, no acute abnormalities and non concerning for [facility], ICH, Meningitis, or temporal arteritis. Pt is afebrile with no focal neuro deficits, nuchal rigidity, or change in vision. Ambulates in ED without difficulty. Pt is to follow up with PCP to discuss prophylactic medication. Pt verbalizes understanding and is agreeable with plan to dc.          Jaci Carrel, [place] 06/06/12 1427

## 2012-06-06 NOTE — ED Provider Notes (Signed)
Medical screening examination/treatment/procedure(s) were performed by non-physician practitioner and as supervising physician I was immediately available for consultation/collaboration.  Gilda Crease, MD 06/06/12 1450

## 2012-06-06 NOTE — Progress Notes (Signed)
During [facility] ED 06/06/12 visit CM spoke with pt who confirms self pay [facility] resident with no pcp. CM discussed and provided written information for self pay pcps, importance of pcp for f/u care, www.needymeds.org, discounted pharmacies, MATCH program and other [place] resources such as financial assistance, DSS and  health department Reviewed Health connect number to assist with finding self pay provider close to pt's residence. Reviewed resources for [place] self pay pcps like [facility], family medicine at [address], [facility] family practice, general medical clinics, [facility] urgent care plus others, [facility] out patient [facility]ies, housing and other resources in [employer]. Pt voiced understanding and appreciation of resources provided  Pt states she is aware of the "orange card" and has made attempts to get one but has "always missed the lady"

## 2012-06-06 NOTE — ED Notes (Signed)
Pt alert and oriented x4. Respirations even and unlabored, bilateral symmetrical rise and fall of chest. Skin warm and dry. In no acute distress. Denies needs.   

## 2013-07-14 ENCOUNTER — Inpatient Hospital Stay (HOSPITAL_COMMUNITY)
Admission: EM | Admit: 2013-07-14 | Discharge: 2013-07-17 | DRG: 419 | Disposition: A | Discharge status: Home / Self Care | Payer: Self-pay | Attending: General Surgery | Admitting: General Surgery

## 2013-07-14 ENCOUNTER — Encounter (HOSPITAL_COMMUNITY): Payer: Self-pay | Admitting: Emergency Medicine

## 2013-07-14 ENCOUNTER — Emergency Department (HOSPITAL_COMMUNITY): Payer: Self-pay

## 2013-07-14 DIAGNOSIS — K810 Acute cholecystitis: Secondary | ICD-10-CM

## 2013-07-14 DIAGNOSIS — I10 Essential (primary) hypertension: Secondary | ICD-10-CM | POA: Diagnosis present

## 2013-07-14 DIAGNOSIS — Z87891 Personal history of nicotine dependence: Secondary | ICD-10-CM

## 2013-07-14 DIAGNOSIS — I2510 Atherosclerotic heart disease of native coronary artery without angina pectoris: Secondary | ICD-10-CM | POA: Diagnosis present

## 2013-07-14 DIAGNOSIS — R079 Chest pain, unspecified: Secondary | ICD-10-CM

## 2013-07-14 DIAGNOSIS — Z01810 Encounter for preprocedural cardiovascular examination: Secondary | ICD-10-CM

## 2013-07-14 DIAGNOSIS — R9431 Abnormal electrocardiogram [ECG] [EKG]: Secondary | ICD-10-CM

## 2013-07-14 DIAGNOSIS — R112 Nausea with vomiting, unspecified: Secondary | ICD-10-CM

## 2013-07-14 DIAGNOSIS — Z01818 Encounter for other preprocedural examination: Secondary | ICD-10-CM

## 2013-07-14 DIAGNOSIS — E119 Type 2 diabetes mellitus without complications: Secondary | ICD-10-CM | POA: Diagnosis present

## 2013-07-14 DIAGNOSIS — Z720 Tobacco use: Secondary | ICD-10-CM

## 2013-07-14 DIAGNOSIS — K219 Gastro-esophageal reflux disease without esophagitis: Secondary | ICD-10-CM | POA: Diagnosis present

## 2013-07-14 DIAGNOSIS — K819 Cholecystitis, unspecified: Secondary | ICD-10-CM

## 2013-07-14 DIAGNOSIS — Z87898 Personal history of other specified conditions: Secondary | ICD-10-CM

## 2013-07-14 DIAGNOSIS — F1491 Cocaine use, unspecified, in remission: Secondary | ICD-10-CM

## 2013-07-14 DIAGNOSIS — R1013 Epigastric pain: Secondary | ICD-10-CM

## 2013-07-14 DIAGNOSIS — R0789 Other chest pain: Secondary | ICD-10-CM | POA: Diagnosis present

## 2013-07-14 DIAGNOSIS — D509 Iron deficiency anemia, unspecified: Secondary | ICD-10-CM | POA: Diagnosis present

## 2013-07-14 HISTORY — DX: Acute cholecystitis: K81.0

## 2013-07-14 HISTORY — DX: Chest pain, unspecified: R07.9

## 2013-07-14 HISTORY — DX: Nausea with vomiting, unspecified: R11.2

## 2013-07-14 LAB — URINALYSIS, ROUTINE W REFLEX MICROSCOPIC
Bilirubin Urine: NEGATIVE
Glucose, UA: NEGATIVE mg/dL
Hgb urine dipstick: NEGATIVE
Ketones, ur: NEGATIVE mg/dL
Leukocytes, UA: NEGATIVE
Nitrite: NEGATIVE
Protein, ur: 30 mg/dL — AB
Specific Gravity, Urine: 1.017 (ref 1.005–1.030)
Urobilinogen, UA: 0.2 mg/dL (ref 0.0–1.0)
pH: 7.5 (ref 5.0–8.0)

## 2013-07-14 LAB — CBC WITH DIFFERENTIAL/PLATELET
Basophils Absolute: 0 10*3/uL (ref 0.0–0.1)
Basophils Relative: 0 % (ref 0–1)
Eosinophils Absolute: 0.2 10*3/uL (ref 0.0–0.7)
Eosinophils Relative: 2 % (ref 0–5)
HCT: 36.1 % (ref 36.0–46.0)
Hemoglobin: 11.8 g/dL — ABNORMAL LOW (ref 12.0–15.0)
Lymphocytes Relative: 35 % (ref 12–46)
Lymphs Abs: 2.3 10*3/uL (ref 0.7–4.0)
MCH: 29.3 pg (ref 26.0–34.0)
MCHC: 32.7 g/dL (ref 30.0–36.0)
MCV: 89.6 fL (ref 78.0–100.0)
Monocytes Absolute: 0.4 10*3/uL (ref 0.1–1.0)
Monocytes Relative: 6 % (ref 3–12)
Neutro Abs: 3.8 10*3/uL (ref 1.7–7.7)
Neutrophils Relative %: 57 % (ref 43–77)
Platelets: 236 10*3/uL (ref 150–400)
RBC: 4.03 MIL/uL (ref 3.87–5.11)
RDW: 14.4 % (ref 11.5–15.5)
WBC: 6.6 10*3/uL (ref 4.0–10.5)

## 2013-07-14 LAB — COMPREHENSIVE METABOLIC PANEL
ALT: 13 U/L (ref 0–35)
AST: 15 U/L (ref 0–37)
Albumin: 3.6 g/dL (ref 3.5–5.2)
Alkaline Phosphatase: 49 U/L (ref 39–117)
BUN: 10 mg/dL (ref 6–23)
CO2: 25 mEq/L (ref 19–32)
Calcium: 8.9 mg/dL (ref 8.4–10.5)
Chloride: 102 mEq/L (ref 96–112)
Creatinine, Ser: 0.76 mg/dL (ref 0.50–1.10)
GFR calc Af Amer: >90 mL/min (ref >90)
GFR calc non Af Amer: >90 mL/min (ref >90)
Glucose, Bld: 126 mg/dL — ABNORMAL HIGH (ref 70–99)
Potassium: 3.6 mEq/L — ABNORMAL LOW (ref 3.7–5.3)
Sodium: 142 mEq/L (ref 137–147)
Total Bilirubin: 0.4 mg/dL (ref 0.3–1.2)
Total Protein: 7.8 g/dL (ref 6.0–8.3)

## 2013-07-14 LAB — I-STAT CHEM 8, ED
BUN: 9 mg/dL (ref 6–23)
Calcium, Ion: 1.19 mmol/L (ref 1.12–1.23)
Chloride: 100 mEq/L (ref 96–112)
Creatinine, Ser: 0.8 mg/dL (ref 0.50–1.10)
Glucose, Bld: 125 mg/dL — ABNORMAL HIGH (ref 70–99)
HCT: 39 % (ref 36.0–46.0)
Hemoglobin: 13.3 g/dL (ref 12.0–15.0)
Potassium: 3.5 mEq/L — ABNORMAL LOW (ref 3.7–5.3)
Sodium: 142 mEq/L (ref 137–147)
TCO2: 24 mmol/L (ref 0–100)

## 2013-07-14 LAB — TYPE AND SCREEN
ABO/RH(D): A POS
Antibody Screen: NEGATIVE

## 2013-07-14 LAB — AMYLASE: Amylase: 120 U/L — ABNORMAL HIGH (ref 0–105)

## 2013-07-14 LAB — URINE MICROSCOPIC-ADD ON

## 2013-07-14 LAB — I-STAT CG4 LACTIC ACID, ED: Lactic Acid, Venous: 1.18 mmol/L (ref 0.5–2.2)

## 2013-07-14 LAB — I-STAT TROPONIN, ED: Troponin i, poc: 0 ng/mL (ref 0.00–0.08)

## 2013-07-14 LAB — POC OCCULT BLOOD, ED: Fecal Occult Bld: NEGATIVE

## 2013-07-14 LAB — PROTIME-INR
INR: 0.92 (ref 0.00–1.49)
Prothrombin Time: 12.2 seconds (ref 11.6–15.2)

## 2013-07-14 LAB — ABO/RH: ABO/RH(D): A POS

## 2013-07-14 LAB — LIPASE, BLOOD: Lipase: 33 U/L (ref 11–59)

## 2013-07-14 MED ORDER — DIPHENHYDRAMINE HCL 12.5 MG/5ML PO ELIX
12.5000 mg | ORAL_SOLUTION | Freq: Four times a day (QID) | ORAL | Status: DC | PRN
Start: 2013-07-14 — End: 2013-07-17

## 2013-07-14 MED ORDER — SODIUM CHLORIDE 0.9 % IV SOLN
3.0000 g | Freq: Four times a day (QID) | INTRAVENOUS | Status: DC
  Administered 2013-07-14 – 2013-07-15 (×5): 3 g via INTRAVENOUS
  Filled 2013-07-14 (×8): qty 3

## 2013-07-14 MED ORDER — SODIUM CHLORIDE 0.9 % IV SOLN
1000.0000 mL | Freq: Once | INTRAVENOUS | Status: AC
  Administered 2013-07-14: 1000 mL via INTRAVENOUS

## 2013-07-14 MED ORDER — ONDANSETRON HCL 4 MG/2ML IJ SOLN
4.0000 mg | Freq: Once | INTRAMUSCULAR | Status: AC
  Administered 2013-07-14: 4 mg via INTRAVENOUS
  Filled 2013-07-14: qty 2

## 2013-07-14 MED ORDER — PANTOPRAZOLE SODIUM 40 MG IV SOLR
40.0000 mg | Freq: Every day | INTRAVENOUS | Status: DC
Start: 2013-07-14 — End: 2013-07-17
  Administered 2013-07-14 – 2013-07-16 (×3): 40 mg via INTRAVENOUS
  Filled 2013-07-14 (×4): qty 40

## 2013-07-14 MED ORDER — HYDROMORPHONE HCL PF 1 MG/ML IJ SOLN
1.0000 mg | Freq: Once | INTRAMUSCULAR | Status: AC
  Administered 2013-07-14: 1 mg via INTRAVENOUS
  Filled 2013-07-14: qty 1

## 2013-07-14 MED ORDER — ONDANSETRON HCL 4 MG/2ML IJ SOLN
4.0000 mg | Freq: Four times a day (QID) | INTRAMUSCULAR | Status: DC | PRN
  Administered 2013-07-14: 4 mg via INTRAVENOUS
  Filled 2013-07-14: qty 2

## 2013-07-14 MED ORDER — MORPHINE SULFATE 4 MG/ML IJ SOLN
4.0000 mg | Freq: Once | INTRAMUSCULAR | Status: AC
  Administered 2013-07-14: 4 mg via INTRAVENOUS
  Filled 2013-07-14: qty 1

## 2013-07-14 MED ORDER — MORPHINE SULFATE 2 MG/ML IJ SOLN
1.0000 mg | INTRAMUSCULAR | Status: DC | PRN
  Administered 2013-07-14 – 2013-07-16 (×5): 2 mg via INTRAVENOUS
  Filled 2013-07-14 (×5): qty 1

## 2013-07-14 MED ORDER — IOHEXOL 300 MG/ML  SOLN
100.0000 mL | Freq: Once | INTRAMUSCULAR | Status: AC | PRN
  Administered 2013-07-14: 100 mL via INTRAVENOUS

## 2013-07-14 MED ORDER — DIPHENHYDRAMINE HCL 50 MG/ML IJ SOLN
12.5000 mg | Freq: Four times a day (QID) | INTRAMUSCULAR | Status: DC | PRN
  Administered 2013-07-15: 12.5 mg via INTRAVENOUS
  Filled 2013-07-14: qty 1

## 2013-07-14 MED ORDER — SODIUM CHLORIDE 0.9 % IV SOLN
1.5000 g | Freq: Once | INTRAVENOUS | Status: AC
  Administered 2013-07-14: 1.5 g via INTRAVENOUS
  Filled 2013-07-14: qty 1.5

## 2013-07-14 MED ORDER — SODIUM CHLORIDE 0.9 % IV SOLN
1000.0000 mL | INTRAVENOUS | Status: DC
  Administered 2013-07-14 (×2): 1000 mL via INTRAVENOUS

## 2013-07-14 MED ORDER — IOHEXOL 300 MG/ML  SOLN
25.0000 mL | INTRAMUSCULAR | Status: DC
  Administered 2013-07-14: 25 mL via ORAL

## 2013-07-14 MED ORDER — POTASSIUM CHLORIDE IN NACL 20-0.45 MEQ/L-% IV SOLN
INTRAVENOUS | Status: DC
  Administered 2013-07-14 – 2013-07-15 (×3): via INTRAVENOUS
  Filled 2013-07-14 (×6): qty 1000

## 2013-07-14 NOTE — Consult Note (Signed)
CARDIOLOGY CONSULT NOTE   Patient ID: Deborah Haney MRN: [id number], DOB/AGE: September 04, 1965   Admit date: 07/14/2013 Date of Consult: 07/14/2013  Primary Physician: previously [facility] Primary Cardiologist: new to [place] Reason for Consultation: chest pain  History of Present Illness Deborah Haney is a 48 y.o. female with HTN and recently diagnosed DM (never started meds) who presents to ED with abdominal pain and vomiting, found to have acute cholecystitis. She also reports chest pain; therefore, Cardiology has been asked to evaluate prior to surgery. Ms. Upadhyay reports she has been under a lot of stress lately. 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 She does not sleep or eat well. She reports intermittent chest "tightness" for 2-3 months. She states this is non-exertional. She has had chest tightness with standing and when lying down. She notices it usually when she is "stressed out or on busy days." She feels anxious. She denies SOB or palpitations. She denies dizziness, near syncope or syncope. She has noticed that it worsens at times with deep inspiration but not all the time. She denies recent illness, cough, fever/chills. She denies any cardiac history, specifically CAD/MI, valvular heart disease, CHF or arrhythmias. She denies any family history of CAD/MI, CHF or sudden death.  Past Medical History Past Medical History  Diagnosis Date  . Anemia     has history, takes iron  . Headache(784.0)   . Diverticulitis     no problems  . GERD (gastroesophageal reflux disease)     tums prn  . Hemorrhoids     Past Surgical History Past Surgical History  Procedure Laterality Date  . Ectopic pregnancy surgery      laparotomy    Allergies/Intolerances No Known Allergies  Current Home Medications      ibuprofen 200 MG tablet  Commonly known as:  ADVIL,MOTRIN  Take 400 mg by mouth every 6 (six) hours  as needed. For pain     Vitamin D 2000 UNITS Caps  Take 1 capsule by mouth daily.     Inpatient Medications . sodium chloride Stopped (07/14/13 0747)   Family History Family History  Problem Relation Age of Onset  . Diabetes Mother   . Arthritis Father      Social History History   Social History  . Marital Status: Divorced    Spouse Name: N/A    Number of Children: N/A  . Years of Education: N/A   Occupational History  . Not on file.   Social History Main Topics  . Smoking status: Former Smoker    Types: Cigarettes  . Smokeless tobacco: Never Used  . Alcohol Use: No  . Drug Use: Yes    Special: Cocaine     Comment: last time 2 years ago  . Sexual Activity: Yes    Birth Control/ Protection: Pill   Other Topics Concern  . Not on file   Social History Narrative  . No narrative on file     Review of Systems General: No chills, fever, night sweats or weight changes  Cardiovascular:  No chest pain, dyspnea on exertion, edema, orthopnea, palpitations, paroxysmal nocturnal dyspnea Dermatological: No rash, lesions or masses Respiratory: No cough, dyspnea Urologic: No hematuria, dysuria Abdominal: No nausea, vomiting, diarrhea, bright red blood per rectum, melena, or hematemesis Neurologic: No visual changes, weakness, changes in mental status All other systems reviewed and are otherwise negative except as noted above.  Physical Exam  Vitals: Blood pressure 144/94, pulse 80, temperature 0 F (-17.8 C), resp. rate 15, height 5\' 6"  (1.676 m), weight 178 lb (80.74 kg), last menstrual period 04/25/2011, SpO2 99.00%.  General: Well developed, well appearing 48 y.o. female in no acute distress. HEENT: Normocephalic, atraumatic. EOMs intact. Sclera nonicteric. Oropharynx clear.  Neck: Supple without bruits. No JVD. Lungs: Respirations regular and unlabored, CTA bilaterally. No wheezes, rales or rhonchi. Heart: RRR. S1, S2 present. No murmurs, rub, S3 or S4. Abdomen:  Soft, non-tender, non-distended. BS present x 4 quadrants. No hepatosplenomegaly.  Extremities: No clubbing, cyanosis or edema. DP/PT/Radials 2+ and equal bilaterally. Psych: Normal affect. Neuro: Alert and oriented X 3. Moves all extremities spontaneously. Musculoskeletal: No kyphosis. Skin: Intact. Warm and dry. No rashes or petechiae in exposed areas.   Labs Lab Results  Component Value Date   WBC 6.6 07/14/2013   HGB 13.3 07/14/2013   HCT 39.0 07/14/2013   MCV 89.6 07/14/2013   PLT 236 07/14/2013    Recent Labs Lab 07/14/13 0128 07/14/13 0131  NA 142 142  K 3.6* 3.5*  CL 102 100  CO2 25  --   BUN 10 9  CREATININE 0.76 0.80  CALCIUM 8.9  --   PROT 7.8  --   BILITOT 0.4  --   ALKPHOS 49  --   ALT 13  --   AST 15  --   GLUCOSE 126* 125*    Recent Labs  07/14/13 0128  INR 0.92    Radiology/Studies Ct Abdomen Pelvis W Contrast 07/14/2013   FINDINGS: Included view of the lung bases demonstrate dependent atelectasis. . Visualized heart and pericardium are unremarkable.  Gallbladder is distended, containing a 2.5 cm gallstone at the neck, with gallbladder wall thickening and mild pericholecystic fluid. Additional 2.2 cm gallstone within the fundus. The liver, spleen, gallbladder, pancreas and adrenal glands are unremarkable.  The stomach, small and large bowel are normal in course and caliber without inflammatory changes. Enteric contrast has not yet reached the distal small bowel. Normal appendix. Small amount of free fluid in the pelvis is likely reactive. No pneumoperitoneum.  Kidneys are orthotopic, demonstrating symmetric enhancement without nephrolithiasis, hydronephrosis or renal masses. The unopacified ureters are normal in course and caliber. Delayed imaging through the kidneys demonstrates symmetric prompt excretion to the proximal urinary collecting system. Urinary bladder is partially distended and unremarkable.  Aortoiliac vessels are normal in course and caliber. No  lymphadenopathy by CT size criteria. 3.9 cm low-density left adnexal cyst. Status post hysterectomy. The soft tissues and included osseous structures are nonsuspicious.   IMPRESSION: Cholelithiasis and acute cholecystitis, with findings concerning for impacted gallstone at the neck. Small amount of free fluid in the pelvis is likely reactive.    Electronically Signed   By: Elon Alas   On: 07/14/2013 06:15    Echocardiogram  None in EPIC  12-lead ECG today - SR with no ischemic ST-T wave abnormalities Telemetry - SR; no arrhythmias  Assessment and Plan Chest pain Ms. Rouch presents with acute cholecystitis and is awaiting surgery. She reports intermittent chest pain x 2-3 months. Her chest pain symptom is atypical for angina; however, she has risk factors for CAD including HTN, DM and prior tobacco abuse. She is currently chest pain free. 12-lead ECG shows SR without ischemic/acute ST-T wave abnormalities. Troponin negative. Will order echo to evaluate heart structure and function.   Darrick Huntsman, PA-C 07/14/2013, 9:27 AM

## 2013-07-14 NOTE — ED Notes (Signed)
Assisted pt up to BR to void.  Pt tolerated activity well.  Reports pain is under control.  Is drinking contrast for CT.

## 2013-07-14 NOTE — H&P (Addendum)
Deborah Haney is an 48 y.o. female.   Chief Complaint: vomiting HPI: 48 yo AAF who developed periumbilical/epigastric/rt sided abd pain last night followed by multiple episodes of emesis. Also states she had a hard stool then noticed some possible blood in her stool which prompted her to come to ED. No f/c. No prior symptoms. No wt loss. No NSAIDS. No acholoic stools. No jaundice  Past Medical History  Diagnosis Date  . Anemia     has history, takes iron  . Headache(784.0)   . Diverticulitis     no problems  . GERD (gastroesophageal reflux disease)     tums prn  . Hemorrhoids     Past Surgical History  Procedure Laterality Date  . Ectopic pregnancy surgery      laparotomy    Family History  Problem Relation Age of Onset  . Diabetes Mother   . Arthritis Father    Social History:  reports that she has quit smoking. Her smoking use included Cigarettes. She smoked 0.00 packs per day. She has never used smokeless tobacco. She reports that she uses illicit drugs (Cocaine). She reports that she does not drink alcohol.  Allergies: No Known Allergies   (Not in a hospital admission)  Results for orders placed during the hospital encounter of 07/14/13 (from the past 48 hour(s))  TYPE AND SCREEN     Status: None   Collection Time    07/14/13  1:25 AM      Result Value Ref Range   ABO/RH(D) A POS     Antibody Screen NEG     Sample Expiration 07/17/2013    POC OCCULT BLOOD, ED     Status: None   Collection Time    07/14/13  1:25 AM      Result Value Ref Range   Fecal Occult Bld NEGATIVE  NEGATIVE  ABO/RH     Status: None   Collection Time    07/14/13  1:25 AM      Result Value Ref Range   ABO/RH(D) A POS    CBC WITH DIFFERENTIAL     Status: Abnormal   Collection Time    07/14/13  1:28 AM      Result Value Ref Range   WBC 6.6  4.0 - 10.5 K/uL   RBC 4.03  3.87 - 5.11 MIL/uL   Hemoglobin 11.8 (*) 12.0 - 15.0 g/dL   HCT 36.1  36.0 - 46.0 %   MCV 89.6  78.0 - 100.0 fL   MCH 29.3  26.0 - 34.0 pg   MCHC 32.7  30.0 - 36.0 g/dL   RDW 14.4  11.5 - 15.5 %   Platelets 236  150 - 400 K/uL   Neutrophils Relative % 57  43 - 77 %   Neutro Abs 3.8  1.7 - 7.7 K/uL   Lymphocytes Relative 35  12 - 46 %   Lymphs Abs 2.3  0.7 - 4.0 K/uL   Monocytes Relative 6  3 - 12 %   Monocytes Absolute 0.4  0.1 - 1.0 K/uL   Eosinophils Relative 2  0 - 5 %   Eosinophils Absolute 0.2  0.0 - 0.7 K/uL   Basophils Relative 0  0 - 1 %   Basophils Absolute 0.0  0.0 - 0.1 K/uL  AMYLASE     Status: Abnormal   Collection Time    07/14/13  1:28 AM      Result Value Ref Range   Amylase 120 (*) 0 -   105 U/L  COMPREHENSIVE METABOLIC PANEL     Status: Abnormal   Collection Time    07/14/13  1:28 AM      Result Value Ref Range   Sodium 142  137 - 147 mEq/L   Potassium 3.6 (*) 3.7 - 5.3 mEq/L   Chloride 102  96 - 112 mEq/L   CO2 25  19 - 32 mEq/L   Glucose, Bld 126 (*) 70 - 99 mg/dL   BUN 10  6 - 23 mg/dL   Creatinine, Ser 0.76  0.50 - 1.10 mg/dL   Calcium 8.9  8.4 - 10.5 mg/dL   Total Protein 7.8  6.0 - 8.3 g/dL   Albumin 3.6  3.5 - 5.2 g/dL   AST 15  0 - 37 U/L   ALT 13  0 - 35 U/L   Alkaline Phosphatase 49  39 - 117 U/L   Total Bilirubin 0.4  0.3 - 1.2 mg/dL   GFR calc non Af Amer >90  >90 mL/min   GFR calc Af Amer >90  >90 mL/min   Comment: (NOTE)     The eGFR has been calculated using the CKD EPI equation.     This calculation has not been validated in all clinical situations.     eGFR's persistently <90 mL/min signify possible Chronic Kidney     Disease.  PROTIME-INR     Status: None   Collection Time    07/14/13  1:28 AM      Result Value Ref Range   Prothrombin Time 12.2  11.6 - 15.2 seconds   INR 0.92  0.00 - 1.49  LIPASE, BLOOD     Status: None   Collection Time    07/14/13  1:28 AM      Result Value Ref Range   Lipase 33  11 - 59 U/L  I-STAT CHEM 8, ED     Status: Abnormal   Collection Time    07/14/13  1:31 AM      Result Value Ref Range   Sodium 142  137 -  147 mEq/L   Potassium 3.5 (*) 3.7 - 5.3 mEq/L   Chloride 100  96 - 112 mEq/L   BUN 9  6 - 23 mg/dL   Creatinine, Ser 0.80  0.50 - 1.10 mg/dL   Glucose, Bld 125 (*) 70 - 99 mg/dL   Calcium, Ion 1.19  1.12 - 1.23 mmol/L   TCO2 24  0 - 100 mmol/L   Hemoglobin 13.3  12.0 - 15.0 g/dL   HCT 39.0  36.0 - 46.0 %  I-STAT CG4 LACTIC ACID, ED     Status: None   Collection Time    07/14/13  1:33 AM      Result Value Ref Range   Lactic Acid, Venous 1.18  0.5 - 2.2 mmol/L  URINALYSIS, ROUTINE W REFLEX MICROSCOPIC     Status: Abnormal   Collection Time    07/14/13  2:53 AM      Result Value Ref Range   Color, Urine YELLOW  YELLOW   APPearance TURBID (*) CLEAR   Specific Gravity, Urine 1.017  1.005 - 1.030   pH 7.5  5.0 - 8.0   Glucose, UA NEGATIVE  NEGATIVE mg/dL   Hgb urine dipstick NEGATIVE  NEGATIVE   Bilirubin Urine NEGATIVE  NEGATIVE   Ketones, ur NEGATIVE  NEGATIVE mg/dL   Protein, ur 30 (*) NEGATIVE mg/dL   Urobilinogen, UA 0.2  0.0 - 1.0 mg/dL   Nitrite   NEGATIVE  NEGATIVE   Leukocytes, UA NEGATIVE  NEGATIVE  URINE MICROSCOPIC-ADD ON     Status: Abnormal   Collection Time    07/14/13  2:53 AM      Result Value Ref Range   Squamous Epithelial / LPF FEW (*) RARE   WBC, UA 0-2  <3 WBC/hpf   RBC / HPF 0-2  <3 RBC/hpf   Bacteria, UA FEW (*) RARE   Urine-Other AMORPHOUS URATES/PHOSPHATES     Ct Abdomen Pelvis W Contrast  07/14/2013   CLINICAL DATA:  Abdominal pain, hematochezia, vomiting.  EXAM: CT ABDOMEN AND PELVIS WITH CONTRAST  TECHNIQUE: Multidetector CT imaging of the abdomen and pelvis was performed using the standard protocol following bolus administration of intravenous contrast.  CONTRAST:  100mL OMNIPAQUE IOHEXOL 300 MG/ML  SOLN  COMPARISON:  None.  FINDINGS: Included view of the lung bases demonstrate dependent atelectasis. . Visualized heart and pericardium are unremarkable.  Gallbladder is distended, containing a 2.5 cm gallstone at the neck, with gallbladder wall thickening  and mild pericholecystic fluid. Additional 2.2 cm gallstone within the fundus. The liver, spleen, gallbladder, pancreas and adrenal glands are unremarkable.  The stomach, small and large bowel are normal in course and caliber without inflammatory changes. Enteric contrast has not yet reached the distal small bowel. Normal appendix. Small amount of free fluid in the pelvis is likely reactive. No pneumoperitoneum.  Kidneys are orthotopic, demonstrating symmetric enhancement without nephrolithiasis, hydronephrosis or renal masses. The unopacified ureters are normal in course and caliber. Delayed imaging through the kidneys demonstrates symmetric prompt excretion to the proximal urinary collecting system. Urinary bladder is partially distended and unremarkable.  Aortoiliac vessels are normal in course and caliber. No lymphadenopathy by CT size criteria. 3.9 cm low-density left adnexal cyst. Status post hysterectomy. The soft tissues and included osseous structures are nonsuspicious.  IMPRESSION: Cholelithiasis and acute cholecystitis, with findings concerning for impacted gallstone at the neck. Small amount of free fluid in the pelvis is likely reactive.   Electronically Signed   By: Courtnay  Bloomer   On: 07/14/2013 06:15    Review of Systems  Constitutional: Negative for fever, chills and weight loss.  HENT: Negative for nosebleeds.   Eyes: Negative for blurred vision.  Respiratory: Negative for shortness of breath.   Cardiovascular: Positive for chest pain. Negative for palpitations, orthopnea, leg swelling and PND.       Some DOE; reports several months of intermittent chest tightness radiating to neck/?jaw - thinks its stress related  Gastrointestinal: Positive for nausea, vomiting, abdominal pain and blood in stool. Negative for heartburn.  Genitourinary: Negative for dysuria and hematuria.  Musculoskeletal: Negative.   Skin: Negative for itching and rash.  Neurological: Negative for dizziness,  focal weakness, seizures, loss of consciousness and headaches.       Denies TIAs, amaurosis fugax  Endo/Heme/Allergies: Does not bruise/bleed easily.       States she is taking blood sugar medication that her doctor rx b/c her family said her sugar wasn't high enough. States her A1C was around 6.5  Psychiatric/Behavioral: The patient is not nervous/anxious.     Blood pressure 144/96, pulse 85, temperature 0 F (-17.8 C), resp. rate 20, height 5' 6" (1.676 m), weight 178 lb (80.74 kg), last menstrual period 04/25/2011, SpO2 99.00%. Physical Exam  Vitals reviewed. Constitutional: She is oriented to person, place, and time. She appears well-developed and well-nourished. No distress.  obese  HENT:  Head: Normocephalic and atraumatic.  Right Ear: External ear normal.    Left Ear: External ear normal.  Eyes: Conjunctivae are normal. No scleral icterus.  Neck: Normal range of motion. Neck supple. No tracheal deviation present. No thyromegaly present.  Cardiovascular: Normal rate and normal heart sounds.   Respiratory: Effort normal and breath sounds normal. No stridor. No respiratory distress. She has no wheezes.  GI: Soft. She exhibits no distension. There is tenderness in the right upper quadrant and epigastric area. There is no rebound and no guarding.    Mild ruq/epigastric TTP. Old trocar site  Musculoskeletal: She exhibits no edema and no tenderness.  Lymphadenopathy:    She has no cervical adenopathy.  Neurological: She is alert and oriented to person, place, and time. She exhibits normal muscle tone.  Skin: Skin is warm and dry. No rash noted. She is not diaphoretic. No erythema. No pallor.  Psychiatric: She has a normal mood and affect. Her behavior is normal. Judgment and thought content normal.     Assessment/Plan Acute cholecystitis Impaired fasting glucose Chest pain  I tried to ask her several difft ways about her chest pain/tightness. I'm not able to r/o a potential  cardiac symptom/issue given her +ROS, age, weight, DM. Therefore, I believe she at least needs cardiology consult to evaluate to see if there is anything to her c/o chest tightness.   In interim, NPO except meds for now until we determine next part of her workup.  IV abx for cholecystitis.   Leighton Ruff. Redmond Pulling, MD, FACS General, Bariatric, & Minimally Invasive Surgery [facility]   Gayland Curry 07/14/2013, 8:30 AM

## 2013-07-14 NOTE — Consult Note (Signed)
The patient was seen and examined, and I agree with the assessment and plan as documented above, with modifications as noted below. Pt has had chest pain both with and without exertion for past 2-3 months. On many occasions, it is exacerbated by lying on left side and relieved with other positions. Has also occurred while driving. Described as "pressure". Denies syncope, does admit to exertional dyspnea. 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 Also diagnosed with acute cholecystitis and surgery is being scheduled for either later today or tomorrow. CT abdomen and pelvis demonstrated "cholelithiasis and acute cholecystitis, with findings concerning for  impacted gallstone at the neck. Small amount of free fluid in the pelvis is likely reactive." Troponins negative and ECG unremarkable. She tells me HbA1C was actually 6.2%, thus not diagnostic for diabetes.  Does have HTN and tobacco use history. Will obtain echo to evaluate systolic function and to assess regional wall motion. If this is not markedly abnormal, I believe she can safely proceed with surgery at an acceptable risk for major adverse cardiac events.

## 2013-07-14 NOTE — ED Notes (Signed)
Pt. With a c/o nausea. Provider notified.

## 2013-07-14 NOTE — ED Provider Notes (Addendum)
CSN: [id number]     Arrival date & time 07/14/13  0031 History   First MD Initiated Contact with Patient 07/14/13 0105     Chief Complaint  Patient presents with  . Rectal Bleeding  . Abdominal Pain  . Emesis     (Consider location/radiation/quality/duration/timing/severity/associated sxs/prior Treatment) Patient is a 48 y.o. female presenting with hematochezia, abdominal pain, and vomiting. The history is provided by the patient.  Rectal Bleeding Associated symptoms: abdominal pain and vomiting   Abdominal Pain Associated symptoms: hematochezia and vomiting   Emesis Associated symptoms: abdominal pain   She had onset about 3 hours ago of crampy right-sided abdominal pain which was followed by nausea and vomiting. About 2 hours ago she had a bowel movement which was mixed stool and blood and she has had several more bowel movements following that which were just blood. Blood was dark red. No abdominal pain is not affected by having a bowel movement or by vomiting. She states that pain is severe and she rates it 10/10. Pain does not radiate. She has not had any fever or sweats but has had some chills. She states that she had an episode of rectal bleeding once before and colonoscopy only showed presence of hemorrhoids. She denies any sick contacts.  Past Medical History  Diagnosis Date  . Anemia     has history, takes iron  . Headache(784.0)   . Diverticulitis     no problems  . GERD (gastroesophageal reflux disease)     tums prn  . Hemorrhoids    Past Surgical History  Procedure Laterality Date  . Ectopic pregnancy surgery      laparotomy   Family History  Problem Relation Age of Onset  . Diabetes Mother   . Arthritis Father    History  Substance Use Topics  . Smoking status: Former Smoker    Types: Cigarettes  . Smokeless tobacco: Never Used  . Alcohol Use: No   OB History   Grav Para Term Preterm Abortions TAB SAB Ect Mult Living   1 0 0 0 1 0 0 1 0 0      Review  of Systems  Gastrointestinal: Positive for vomiting, abdominal pain and hematochezia.  All other systems reviewed and are negative.     Allergies  Review of patient's allergies indicates no known allergies.  Home Medications   Prior to Admission medications   Medication Sig Start Date End Date Taking? Authorizing Provider  acetaminophen (TYLENOL) 500 MG tablet Take 500 mg by mouth every 6 (six) hours as needed for pain.    Historical Provider, MD  ibuprofen (ADVIL,MOTRIN) 200 MG tablet Take 400 mg by mouth every 6 (six) hours as needed. For pain    Historical Provider, MD  naproxen (NAPROSYN) 375 MG tablet Take 1 tablet (375 mg total) by mouth 2 (two) times daily. 06/06/12   Lisette Paz, PA-C   BP 140/93  Pulse 118  Resp 24  Ht 5\' 6"  (1.676 m)  Wt 178 lb (80.74 kg)  BMI 28.74 kg/m2  SpO2 100%  LMP 04/25/2011 Physical Exam  Nursing note and vitals reviewed.  48 year old female, resting comfortably and in no acute distress. Vital signs are significant for tachycardia with heart rate 118, tachypnea with respiratory rate of 24, and hypertension with blood pressure 140/93. Oxygen saturation is 100%, which is normal. Head is normocephalic and atraumatic. PERRLA, EOMI. Oropharynx is clear. Neck is nontender and supple without adenopathy or JVD. Back is nontender and there  is no CVA tenderness. Lungs are clear without rales, wheezes, or rhonchi. Chest is nontender. Heart has regular rate and rhythm without murmur. Abdomen is soft, flat, with mild tenderness in the right upper and right lower abdomen without rebound or guarding. There are no masses or hepatosplenomegaly and peristalsis is normoactive. Rectal: External hemorrhoids are present which are moderately tender. Sphincter tone is normal. There is a small amount of dark brown stool which is Hemoccult negative. Extremities have no cyanosis or edema, full range of motion is present. Skin is warm and dry without rash. Neurologic:  Mental status is normal, cranial nerves are intact, there are no motor or sensory deficits.  ED Course  Procedures (including critical care time) Labs Review Results for orders placed during the hospital encounter of 07/14/13  URINALYSIS, ROUTINE W REFLEX MICROSCOPIC      Result Value Ref Range   Color, Urine YELLOW  YELLOW   APPearance TURBID (*) CLEAR   Specific Gravity, Urine 1.017  1.005 - 1.030   pH 7.5  5.0 - 8.0   Glucose, UA NEGATIVE  NEGATIVE mg/dL   Hgb urine dipstick NEGATIVE  NEGATIVE   Bilirubin Urine NEGATIVE  NEGATIVE   Ketones, ur NEGATIVE  NEGATIVE mg/dL   Protein, ur 30 (*) NEGATIVE mg/dL   Urobilinogen, UA 0.2  0.0 - 1.0 mg/dL   Nitrite NEGATIVE  NEGATIVE   Leukocytes, UA NEGATIVE  NEGATIVE  CBC WITH DIFFERENTIAL      Result Value Ref Range   WBC 6.6  4.0 - 10.5 K/uL   RBC 4.03  3.87 - 5.11 MIL/uL   Hemoglobin 11.8 (*) 12.0 - 15.0 g/dL   HCT 36.1  36.0 - 46.0 %   MCV 89.6  78.0 - 100.0 fL   MCH 29.3  26.0 - 34.0 pg   MCHC 32.7  30.0 - 36.0 g/dL   RDW 14.4  11.5 - 15.5 %   Platelets 236  150 - 400 K/uL   Neutrophils Relative % 57  43 - 77 %   Neutro Abs 3.8  1.7 - 7.7 K/uL   Lymphocytes Relative 35  12 - 46 %   Lymphs Abs 2.3  0.7 - 4.0 K/uL   Monocytes Relative 6  3 - 12 %   Monocytes Absolute 0.4  0.1 - 1.0 K/uL   Eosinophils Relative 2  0 - 5 %   Eosinophils Absolute 0.2  0.0 - 0.7 K/uL   Basophils Relative 0  0 - 1 %   Basophils Absolute 0.0  0.0 - 0.1 K/uL  AMYLASE      Result Value Ref Range   Amylase 120 (*) 0 - 105 U/L  COMPREHENSIVE METABOLIC PANEL      Result Value Ref Range   Sodium 142  137 - 147 mEq/L   Potassium 3.6 (*) 3.7 - 5.3 mEq/L   Chloride 102  96 - 112 mEq/L   CO2 25  19 - 32 mEq/L   Glucose, Bld 126 (*) 70 - 99 mg/dL   BUN 10  6 - 23 mg/dL   Creatinine, Ser 0.76  0.50 - 1.10 mg/dL   Calcium 8.9  8.4 - 10.5 mg/dL   Total Protein 7.8  6.0 - 8.3 g/dL   Albumin 3.6  3.5 - 5.2 g/dL   AST 15  0 - 37 U/L   ALT 13  0 - 35 U/L    Alkaline Phosphatase 49  39 - 117 U/L   Total Bilirubin 0.4  0.3 -  1.2 mg/dL   GFR calc non Af Amer >90  >90 mL/min   GFR calc Af Amer >90  >90 mL/min  PROTIME-INR      Result Value Ref Range   Prothrombin Time 12.2  11.6 - 15.2 seconds   INR 0.92  0.00 - 1.49  LIPASE, BLOOD      Result Value Ref Range   Lipase 33  11 - 59 U/L  URINE MICROSCOPIC-ADD ON      Result Value Ref Range   Squamous Epithelial / LPF FEW (*) RARE   WBC, UA 0-2  <3 WBC/hpf   RBC / HPF 0-2  <3 RBC/hpf   Bacteria, UA FEW (*) RARE   Urine-Other AMORPHOUS URATES/PHOSPHATES    I-STAT CHEM 8, ED      Result Value Ref Range   Sodium 142  137 - 147 mEq/L   Potassium 3.5 (*) 3.7 - 5.3 mEq/L   Chloride 100  96 - 112 mEq/L   BUN 9  6 - 23 mg/dL   Creatinine, Ser 0.80  0.50 - 1.10 mg/dL   Glucose, Bld 125 (*) 70 - 99 mg/dL   Calcium, Ion 1.19  1.12 - 1.23 mmol/L   TCO2 24  0 - 100 mmol/L   Hemoglobin 13.3  12.0 - 15.0 g/dL   HCT 39.0  36.0 - 46.0 %  I-STAT CG4 LACTIC ACID, ED      Result Value Ref Range   Lactic Acid, Venous 1.18  0.5 - 2.2 mmol/L  POC OCCULT BLOOD, ED      Result Value Ref Range   Fecal Occult Bld NEGATIVE  NEGATIVE  TYPE AND SCREEN      Result Value Ref Range   ABO/RH(D) A POS     Antibody Screen NEG     Sample Expiration 07/17/2013    ABO/RH      Result Value Ref Range   ABO/RH(D) A POS     Imaging Review Ct Abdomen Pelvis W Contrast  07/14/2013   CLINICAL DATA:  Abdominal pain, hematochezia, vomiting.  EXAM: CT ABDOMEN AND PELVIS WITH CONTRAST  TECHNIQUE: Multidetector CT imaging of the abdomen and pelvis was performed using the standard protocol following bolus administration of intravenous contrast.  CONTRAST:  177mL OMNIPAQUE IOHEXOL 300 MG/ML  SOLN  COMPARISON:  None.  FINDINGS: Included view of the lung bases demonstrate dependent atelectasis. . Visualized heart and pericardium are unremarkable.  Gallbladder is distended, containing a 2.5 cm gallstone at the neck, with gallbladder  wall thickening and mild pericholecystic fluid. Additional 2.2 cm gallstone within the fundus. The liver, spleen, gallbladder, pancreas and adrenal glands are unremarkable.  The stomach, small and large bowel are normal in course and caliber without inflammatory changes. Enteric contrast has not yet reached the distal small bowel. Normal appendix. Small amount of free fluid in the pelvis is likely reactive. No pneumoperitoneum.  Kidneys are orthotopic, demonstrating symmetric enhancement without nephrolithiasis, hydronephrosis or renal masses. The unopacified ureters are normal in course and caliber. Delayed imaging through the kidneys demonstrates symmetric prompt excretion to the proximal urinary collecting system. Urinary bladder is partially distended and unremarkable.  Aortoiliac vessels are normal in course and caliber. No lymphadenopathy by CT size criteria. 3.9 cm low-density left adnexal cyst. Status post hysterectomy. The soft tissues and included osseous structures are nonsuspicious.  IMPRESSION: Cholelithiasis and acute cholecystitis, with findings concerning for impacted gallstone at the neck. Small amount of free fluid in the pelvis is likely reactive.   Electronically  Signed   By: Elon Alas   On: 07/14/2013 06:15   MDM   Final diagnoses:  Cholecystitis    Crampy abdominal pain, vomiting, diarrhea which may be a viral gastroenteritis or food poisoning. No evidence of GI bleed. Of note, although she was tachycardic at triage, heart rate was 80 at the time of my exam. When patient was advised that her stool tested negative for blood, she insisted that the test was drawn and that she actually had blood in the stool. She will be given IV fluids, IV ondansetron, and a small dose of morphine. Old records are reviewed and she has an ED visit for rectal bleeding in December 2013 at which time stool was also Hemoccult negative and hemoglobin normal.  Amylases come back slightly elevated. This  has required several additional doses of narcotic analgesics and antiemetics. Lipase is actually come back normal. Case is discussed with Dr. Roel Cluck who requests abdominal imaging be obtained. Patient denies ethanol use and denies a hyperlipidemia. CT is obtained showing evidence of cholecystitis with probable impacted gallstone at the neck of the gallbladder. Case is been discussed with Dr. Redmond Pulling of general surgery who requested she be given a dose of Unasyn and states that he will come and evaluate her for admission.  Delora Fuel, MD 63/14/97 0263  Dr. Redmond Pulling has seen the patient and is concerned because she mentioned chest pain on review of systems. ECG and troponin will be obtained and [facility] hospitalist will be consulted to evaluate as to whether the patient is stable enough for acute surgical management.  Delora Fuel, MD 78/58/85 0277

## 2013-07-14 NOTE — ED Notes (Signed)
Began experiencing stomach pain approx 2.5 hrs ago. Vomited several times, blood stool reported. Abdominal pain all over stomach but worse RUQ/RLQ than left side.  Ate fried fish last approx 2130.

## 2013-07-14 NOTE — ED Notes (Signed)
Patient presents stating about 2 hours ago she started having BM's that were nothing but dark blood, vomiting and feeling bad. Abd pain

## 2013-07-14 NOTE — ED Notes (Signed)
CT notified pt completed first glass of oral contrast.

## 2013-07-15 ENCOUNTER — Encounter (HOSPITAL_COMMUNITY): Payer: Self-pay | Admitting: Anesthesiology

## 2013-07-15 ENCOUNTER — Inpatient Hospital Stay (HOSPITAL_COMMUNITY): Payer: Self-pay | Admitting: Anesthesiology

## 2013-07-15 ENCOUNTER — Encounter (HOSPITAL_COMMUNITY): Payer: MEDICAID | Admitting: Anesthesiology

## 2013-07-15 ENCOUNTER — Encounter (HOSPITAL_COMMUNITY): Admission: EM | Disposition: A | Discharge status: Home / Self Care | Payer: Self-pay | Source: Home / Self Care

## 2013-07-15 DIAGNOSIS — K8010 Calculus of gallbladder with chronic cholecystitis without obstruction: Secondary | ICD-10-CM

## 2013-07-15 DIAGNOSIS — R9431 Abnormal electrocardiogram [ECG] [EKG]: Secondary | ICD-10-CM

## 2013-07-15 DIAGNOSIS — K819 Cholecystitis, unspecified: Secondary | ICD-10-CM

## 2013-07-15 DIAGNOSIS — I369 Nonrheumatic tricuspid valve disorder, unspecified: Secondary | ICD-10-CM

## 2013-07-15 HISTORY — PX: CHOLECYSTECTOMY: SHX55

## 2013-07-15 LAB — SURGICAL PCR SCREEN
MRSA, PCR: NEGATIVE
Staphylococcus aureus: NEGATIVE

## 2013-07-15 LAB — COMPREHENSIVE METABOLIC PANEL
ALT: 11 U/L (ref 0–35)
AST: 14 U/L (ref 0–37)
Albumin: 3 g/dL — ABNORMAL LOW (ref 3.5–5.2)
Alkaline Phosphatase: 49 U/L (ref 39–117)
BUN: 5 mg/dL — ABNORMAL LOW (ref 6–23)
CO2: 23 mEq/L (ref 19–32)
Calcium: 8.2 mg/dL — ABNORMAL LOW (ref 8.4–10.5)
Chloride: 105 mEq/L (ref 96–112)
Creatinine, Ser: 0.74 mg/dL (ref 0.50–1.10)
GFR calc Af Amer: >90 mL/min (ref >90)
GFR calc non Af Amer: >90 mL/min (ref >90)
Glucose, Bld: 96 mg/dL (ref 70–99)
Potassium: 3.5 mEq/L — ABNORMAL LOW (ref 3.7–5.3)
Sodium: 141 mEq/L (ref 137–147)
Total Bilirubin: 0.9 mg/dL (ref 0.3–1.2)
Total Protein: 6.8 g/dL (ref 6.0–8.3)

## 2013-07-15 LAB — CBC
HCT: 35.1 % — ABNORMAL LOW (ref 36.0–46.0)
Hemoglobin: 11.6 g/dL — ABNORMAL LOW (ref 12.0–15.0)
MCH: 29.4 pg (ref 26.0–34.0)
MCHC: 33 g/dL (ref 30.0–36.0)
MCV: 88.9 fL (ref 78.0–100.0)
Platelets: 210 10*3/uL (ref 150–400)
RBC: 3.95 MIL/uL (ref 3.87–5.11)
RDW: 14.7 % (ref 11.5–15.5)
WBC: 6 10*3/uL (ref 4.0–10.5)

## 2013-07-15 SURGERY — LAPAROSCOPIC CHOLECYSTECTOMY
Anesthesia: General | Site: Abdomen

## 2013-07-15 MED ORDER — NEOSTIGMINE METHYLSULFATE 10 MG/10ML IV SOLN
INTRAVENOUS | Status: AC
  Filled 2013-07-15: qty 1

## 2013-07-15 MED ORDER — SODIUM CHLORIDE 0.9 % IV SOLN
INTRAVENOUS | Status: DC
  Administered 2013-07-15 – 2013-07-16 (×2): via INTRAVENOUS

## 2013-07-15 MED ORDER — NEOSTIGMINE METHYLSULFATE 10 MG/10ML IV SOLN
INTRAVENOUS | Status: DC | PRN
  Administered 2013-07-15: 3 mg via INTRAVENOUS

## 2013-07-15 MED ORDER — LACTATED RINGERS IV SOLN
INTRAVENOUS | Status: DC
  Administered 2013-07-15: 14:00:00 via INTRAVENOUS

## 2013-07-15 MED ORDER — OXYCODONE-ACETAMINOPHEN 5-325 MG PO TABS
1.0000 | ORAL_TABLET | ORAL | Status: DC | PRN
  Administered 2013-07-15 – 2013-07-17 (×5): 2 via ORAL
  Filled 2013-07-15 (×4): qty 2

## 2013-07-15 MED ORDER — FENTANYL CITRATE 0.05 MG/ML IJ SOLN
INTRAMUSCULAR | Status: AC
  Filled 2013-07-15: qty 5

## 2013-07-15 MED ORDER — OXYCODONE HCL 5 MG PO TABS
ORAL_TABLET | ORAL | Status: AC
Start: 2013-07-15 — End: 2013-07-16
  Filled 2013-07-15: qty 1

## 2013-07-15 MED ORDER — OXYCODONE HCL 5 MG/5ML PO SOLN: 5.0000 mg | Freq: Once | ORAL | Status: AC | PRN

## 2013-07-15 MED ORDER — MIDAZOLAM HCL 5 MG/5ML IJ SOLN
INTRAMUSCULAR | Status: DC | PRN
  Administered 2013-07-15 (×2): 1 mg via INTRAVENOUS

## 2013-07-15 MED ORDER — BUPIVACAINE-EPINEPHRINE (PF) 0.25% -1:200000 IJ SOLN
INTRAMUSCULAR | Status: AC
  Filled 2013-07-15: qty 30

## 2013-07-15 MED ORDER — MIDAZOLAM HCL 2 MG/2ML IJ SOLN
INTRAMUSCULAR | Status: AC
  Filled 2013-07-15: qty 2

## 2013-07-15 MED ORDER — FENTANYL CITRATE 0.05 MG/ML IJ SOLN
INTRAMUSCULAR | Status: DC | PRN
  Administered 2013-07-15 (×8): 50 ug via INTRAVENOUS

## 2013-07-15 MED ORDER — BIOTENE DRY MOUTH MT LIQD
15.0000 mL | Freq: Two times a day (BID) | OROMUCOSAL | Status: DC
  Administered 2013-07-15 – 2013-07-16 (×3): 15 mL via OROMUCOSAL

## 2013-07-15 MED ORDER — LIDOCAINE HCL (CARDIAC) 20 MG/ML IV SOLN
INTRAVENOUS | Status: AC
  Filled 2013-07-15: qty 5

## 2013-07-15 MED ORDER — OXYCODONE HCL 5 MG PO TABS
5.0000 mg | ORAL_TABLET | Freq: Once | ORAL | Status: AC | PRN
  Administered 2013-07-15: 5 mg via ORAL

## 2013-07-15 MED ORDER — LIDOCAINE HCL (CARDIAC) 20 MG/ML IV SOLN
INTRAVENOUS | Status: DC | PRN
  Administered 2013-07-15: 40 mg via INTRAVENOUS

## 2013-07-15 MED ORDER — ONDANSETRON HCL 4 MG/2ML IJ SOLN
INTRAMUSCULAR | Status: AC
  Filled 2013-07-15: qty 2

## 2013-07-15 MED ORDER — HEMOSTATIC AGENTS (NO CHARGE) OPTIME
TOPICAL | Status: DC | PRN
  Administered 2013-07-15: 2 via TOPICAL

## 2013-07-15 MED ORDER — LACTATED RINGERS IV SOLN
INTRAVENOUS | Status: DC | PRN
  Administered 2013-07-15: 15:00:00 via INTRAVENOUS

## 2013-07-15 MED ORDER — CHLORHEXIDINE GLUCONATE 0.12 % MT SOLN
15.0000 mL | Freq: Two times a day (BID) | OROMUCOSAL | Status: DC
  Administered 2013-07-15 – 2013-07-17 (×5): 15 mL via OROMUCOSAL
  Filled 2013-07-15 (×5): qty 15

## 2013-07-15 MED ORDER — BUPIVACAINE-EPINEPHRINE 0.25% -1:200000 IJ SOLN
INTRAMUSCULAR | Status: DC | PRN
  Administered 2013-07-15: 12 mL

## 2013-07-15 MED ORDER — PROPOFOL 10 MG/ML IV BOLUS
INTRAVENOUS | Status: AC
  Filled 2013-07-15: qty 20

## 2013-07-15 MED ORDER — MIDAZOLAM HCL 2 MG/2ML IJ SOLN
0.5000 mg | Freq: Once | INTRAMUSCULAR | Status: DC | PRN
Start: 2013-07-15 — End: 2013-07-15

## 2013-07-15 MED ORDER — ROCURONIUM BROMIDE 100 MG/10ML IV SOLN
INTRAVENOUS | Status: DC | PRN
Start: 2013-07-15 — End: 2013-07-15
  Administered 2013-07-15: 40 mg via INTRAVENOUS

## 2013-07-15 MED ORDER — SODIUM CHLORIDE 0.9 % IR SOLN
Status: DC | PRN
  Administered 2013-07-15: 2000 mL

## 2013-07-15 MED ORDER — ARTIFICIAL TEARS OP OINT
TOPICAL_OINTMENT | OPHTHALMIC | Status: DC | PRN
  Administered 2013-07-15: 1 via OPHTHALMIC

## 2013-07-15 MED ORDER — HYDROMORPHONE HCL PF 1 MG/ML IJ SOLN
INTRAMUSCULAR | Status: AC
  Filled 2013-07-15: qty 1

## 2013-07-15 MED ORDER — PROMETHAZINE HCL 25 MG/ML IJ SOLN: 6.2500 mg | INTRAMUSCULAR | Status: DC | PRN

## 2013-07-15 MED ORDER — PROPOFOL 10 MG/ML IV BOLUS
INTRAVENOUS | Status: DC | PRN
  Administered 2013-07-15: 120 mg via INTRAVENOUS

## 2013-07-15 MED ORDER — MEPERIDINE HCL 25 MG/ML IJ SOLN: 6.2500 mg | INTRAMUSCULAR | Status: DC | PRN

## 2013-07-15 MED ORDER — OXYCODONE-ACETAMINOPHEN 5-325 MG PO TABS
ORAL_TABLET | ORAL | Status: AC
  Filled 2013-07-15: qty 2

## 2013-07-15 MED ORDER — PIPERACILLIN-TAZOBACTAM 3.375 G IVPB
3.3750 g | Freq: Three times a day (TID) | INTRAVENOUS | Status: DC
  Administered 2013-07-15 – 2013-07-17 (×5): 3.375 g via INTRAVENOUS
  Filled 2013-07-15 (×8): qty 50

## 2013-07-15 MED ORDER — ONDANSETRON HCL 4 MG/2ML IJ SOLN
INTRAMUSCULAR | Status: DC | PRN
  Administered 2013-07-15: 4 mg via INTRAVENOUS

## 2013-07-15 MED ORDER — HYDROMORPHONE HCL PF 1 MG/ML IJ SOLN
0.2500 mg | INTRAMUSCULAR | Status: DC | PRN
  Administered 2013-07-15 (×2): 0.5 mg via INTRAVENOUS

## 2013-07-15 MED ORDER — 0.9 % SODIUM CHLORIDE (POUR BTL) OPTIME
TOPICAL | Status: DC | PRN
  Administered 2013-07-15: 1000 mL

## 2013-07-15 MED ORDER — ARTIFICIAL TEARS OP OINT
TOPICAL_OINTMENT | OPHTHALMIC | Status: AC
  Filled 2013-07-15: qty 3.5

## 2013-07-15 MED ORDER — GLYCOPYRROLATE 0.2 MG/ML IJ SOLN
INTRAMUSCULAR | Status: AC
  Filled 2013-07-15: qty 3

## 2013-07-15 MED ORDER — GLYCOPYRROLATE 0.2 MG/ML IJ SOLN
INTRAMUSCULAR | Status: DC | PRN
  Administered 2013-07-15: 0.4 mg via INTRAVENOUS

## 2013-07-15 SURGICAL SUPPLY — 41 items
APPLIER CLIP 5 13 M/L LIGAMAX5 (MISCELLANEOUS) ×2
BLADE SURG ROTATE 9660 (MISCELLANEOUS) IMPLANT
CANISTER SUCTION 2500CC (MISCELLANEOUS) ×2 IMPLANT
CHLORAPREP W/TINT 26ML (MISCELLANEOUS) ×2 IMPLANT
CLIP APPLIE 5 13 M/L LIGAMAX5 (MISCELLANEOUS) ×1 IMPLANT
CLSR STERI-STRIP ANTIMIC 1/2X4 (GAUZE/BANDAGES/DRESSINGS) ×2 IMPLANT
COVER MAYO STAND STRL (DRAPES) IMPLANT
COVER SURGICAL LIGHT HANDLE (MISCELLANEOUS) ×2 IMPLANT
DECANTER SPIKE VIAL GLASS SM (MISCELLANEOUS) ×2 IMPLANT
DERMABOND ADVANCED (GAUZE/BANDAGES/DRESSINGS) ×1
DERMABOND ADVANCED .7 DNX12 (GAUZE/BANDAGES/DRESSINGS) ×1 IMPLANT
DEVICE TROCAR PUNCTURE CLOSURE (ENDOMECHANICALS) ×2 IMPLANT
DRAPE C-ARM 42X72 X-RAY (DRAPES) IMPLANT
ELECT REM PT RETURN 9FT ADLT (ELECTROSURGICAL) ×2
ELECTRODE REM PT RTRN 9FT ADLT (ELECTROSURGICAL) ×1 IMPLANT
GLOVE BIO SURGEON STRL SZ7 (GLOVE) ×2 IMPLANT
GLOVE BIO SURGEON STRL SZ7.5 (GLOVE) ×2 IMPLANT
GLOVE BIOGEL PI IND STRL 7.0 (GLOVE) ×3 IMPLANT
GLOVE BIOGEL PI IND STRL 7.5 (GLOVE) ×2 IMPLANT
GLOVE BIOGEL PI INDICATOR 7.0 (GLOVE) ×3
GLOVE BIOGEL PI INDICATOR 7.5 (GLOVE) ×2
GLOVE SURG SS PI 7.0 STRL IVOR (GLOVE) ×2 IMPLANT
GOWN STRL REUS W/ TWL LRG LVL3 (GOWN DISPOSABLE) ×4 IMPLANT
GOWN STRL REUS W/TWL LRG LVL3 (GOWN DISPOSABLE) ×4
KIT BASIN OR (CUSTOM PROCEDURE TRAY) ×2 IMPLANT
KIT ROOM TURNOVER OR (KITS) ×2 IMPLANT
NS IRRIG 1000ML POUR BTL (IV SOLUTION) ×2 IMPLANT
PAD ARMBOARD 7.5X6 YLW CONV (MISCELLANEOUS) ×2 IMPLANT
POUCH RETRIEVAL ECOSAC 10 (ENDOMECHANICALS) ×1 IMPLANT
POUCH RETRIEVAL ECOSAC 10MM (ENDOMECHANICALS) ×1
SCISSORS LAP 5X35 DISP (ENDOMECHANICALS) ×2 IMPLANT
SET CHOLANGIOGRAPH 5 50 .035 (SET/KITS/TRAYS/PACK) IMPLANT
SET IRRIG TUBING LAPAROSCOPIC (IRRIGATION / IRRIGATOR) ×2 IMPLANT
SLEEVE ENDOPATH XCEL 5M (ENDOMECHANICALS) ×4 IMPLANT
SPECIMEN JAR SMALL (MISCELLANEOUS) ×2 IMPLANT
SUT MNCRL AB 4-0 PS2 18 (SUTURE) ×2 IMPLANT
TOWEL OR 17X24 6PK STRL BLUE (TOWEL DISPOSABLE) ×2 IMPLANT
TOWEL OR 17X26 10 PK STRL BLUE (TOWEL DISPOSABLE) ×2 IMPLANT
TRAY LAPAROSCOPIC (CUSTOM PROCEDURE TRAY) ×2 IMPLANT
TROCAR XCEL BLUNT TIP 100MML (ENDOMECHANICALS) ×2 IMPLANT
TROCAR XCEL NON-BLD 5MMX100MML (ENDOMECHANICALS) ×2 IMPLANT

## 2013-07-15 NOTE — Anesthesia Procedure Notes (Signed)
Procedure Name: Intubation Date/Time: 07/15/2013 3:33 PM Performed by: Erik Obey Pre-anesthesia Checklist: Patient identified, Emergency Drugs available, Patient being monitored, Timeout performed and Suction available Patient Re-evaluated:Patient Re-evaluated prior to inductionOxygen Delivery Method: Circle system utilized Preoxygenation: Pre-oxygenation with 100% oxygen Intubation Type: IV induction Ventilation: Mask ventilation without difficulty Laryngoscope Size: Mac and 3 Grade View: Grade I Tube type: Oral Tube size: 7.5 mm Number of attempts: 1 Airway Equipment and Method: Stylet Placement Confirmation: ETT inserted through vocal cords under direct vision,  positive ETCO2 and breath sounds checked- equal and bilateral Secured at: 21 cm Tube secured with: Tape Dental Injury: Teeth and Oropharynx as per pre-operative assessment

## 2013-07-15 NOTE — Anesthesia Postprocedure Evaluation (Signed)
  Anesthesia Post-op Note  [name]  Procedure(s) Performed: Procedure(s): LAPAROSCOPIC CHOLECYSTECTOMY (N/A)  Patient Location: PACU  Anesthesia Type:General  Level of Consciousness: awake, alert , oriented, patient cooperative and responds to stimulation  Airway and Oxygen Therapy: Patient Spontanous Breathing  Post-op Pain: mild  Post-op Assessment: Post-op Vital signs reviewed, Patient's Cardiovascular Status Stable, Respiratory Function Stable, Patent Airway, No signs of Nausea or vomiting and Pain level controlled  Post-op Vital Signs: Reviewed and stable  Last Vitals:  Filed Vitals:   07/15/13 1810  BP:   Pulse: 72  Temp: 36.9 C  Resp: 17    Complications: No apparent anesthesia complications

## 2013-07-15 NOTE — Op Note (Signed)
Preoperative diagnosis: Acute cholecystitis  Postoperative diagnosis: Same as above  Procedure: Laparoscopic cholecystectomy  Surgeon: Dr. Serita Grammes  Anesthesia: Gen.  Estimated blood loss: Minimal  Drains: None  Specimens: Gallbladder and contents to pathology  Complications: None  Sponge needle count was correct completion  Disposition to recovery in stable condition  Indications: This is a 5 yof who was admitted for acute cholecystitis. We discussed laparoscopic cholecystectomy with risks and benefits associated with that.  Procedure: After informed consent was obtained the patient was taken to the operating room. She was given antibiotics on the floor. Sequential compression devices were on her legs. She was placed under general anesthesia without complication. Her abdomen was prepped and draped in the standard sterile surgical fashion. A surgical timeout was then performed.  Her stomach was evacuated via an orogastric tube.I made a vertical incision below her umbilicus. I then grasped her fascia. I made an incision in her fascia. I entered her peritoneum bluntly. I placed a 0 Vicryl pursestring suture to the fascia.I then inserted a Hassan trocar and insufflated the abdomen to 15 mm mercury pressure. I then inserted 3 further 5 mm trocars in the epigastrium and right upper quadrant without complication. The gallbladder was known noted to have acute cholecystitis. It had a lot of inflammation and the omentum and duodenum were adherent. These were taken down with a combination of blunt and sharp dissection. The gallbladder was necrotic and tense. I had to aspirate it to retract. I retracted the cephalad and lateral. I then dissected in the triangle to obtain the critical view of safety. I then clipped the duct 3 times and left 2 clips in place. The duct was viable and my clips were completely across the duct. I treated the artery a similar fashion. This was also divided. I then removed the  gallbladder from the liver bed without difficulty. This was placed in a bag and removed from the umbilicus. Irrigation was performed. I obtained hemostasis. I did place 2 pieces of surgicel snow.I then removed the hasson trocar. I tied my pursestring suture and this completely obliterated the umbilical defect. I did use the endoclose device and place 2 additional 0 vicryl sutures.I then desufflated the abdomen and remove my remaining trocars. These were all closed with 4 Monocryl and Dermabond. She tolerated this well was extubated and transferred to the recovery room stable.

## 2013-07-15 NOTE — Anesthesia Preprocedure Evaluation (Addendum)
Anesthesia Evaluation  Patient identified by MRN, date of birth, ID band Patient awake    Reviewed: Allergy & Precautions, H&P , NPO status , Patient's Chart, lab work & pertinent test results  History of Anesthesia Complications Negative for: history of anesthetic complications  Airway Mallampati: II TM Distance: >3 FB Neck ROM: Full    Dental  (+) Teeth Intact, Dental Advisory Given   Pulmonary former smoker,  breath sounds clear to auscultation  Pulmonary exam normal       Cardiovascular - anginanegative cardio ROS  Rhythm:Regular Rate:Normal  07/15/13 ECHO: EF 55-60%, valves OK   Neuro/Psych negative neurological ROS     GI/Hepatic GERD-  Controlled,(+)     substance abuse (last use 2009)  cocaine use, N/v with acute chole   Endo/Other  negative endocrine ROS  Renal/GU negative Renal ROS     Musculoskeletal   Abdominal (+) + obese,   Peds  Hematology  (+) Blood dyscrasia (Hb 11.6), anemia ,   Anesthesia Other Findings   Reproductive/Obstetrics                         Anesthesia Physical Anesthesia Plan  ASA: II  Anesthesia Plan: General   Post-op Pain Management:    Induction: Intravenous  Airway Management Planned: Oral ETT  Additional Equipment:   Intra-op Plan:   Post-operative Plan: Extubation in OR  Informed Consent: I have reviewed the patients History and Physical, chart, labs and discussed the procedure including the risks, benefits and alternatives for the proposed anesthesia with the patient or authorized representative who has indicated his/her understanding and acceptance.   Dental advisory given  Plan Discussed with: Surgeon and CRNA  Anesthesia Plan Comments: (Plan routine monitors, GETA)        Anesthesia Quick Evaluation

## 2013-07-15 NOTE — Progress Notes (Addendum)
Echo not yet performed - will leave preoperative risk assessment once this has been reviewed later today.  Pixie Casino, MD, [facility] Attending Cardiologist [facility]  Echo reviewed - EF 50-55%, no WMA's. Friendly for surgery today.

## 2013-07-15 NOTE — Progress Notes (Signed)
ANTIBIOTIC CONSULT NOTE - INITIAL  Pharmacy Consult for  Zosyn Indication: Post op acute cholecystitis  No Known Allergies  Patient Measurements: Height: 5\' 6"  (167.6 cm) Weight: 182 lb 14.4 oz (82.963 kg) IBW/kg (Calculated) : 59.3 Adjusted Body Weight:   Vital Signs: Temp: 98.5 F (36.9 C) (05/11 1832) Temp src: Oral (05/11 1832) BP: 132/82 mmHg (05/11 1832) Pulse Rate: 87 (05/11 1832) Intake/Output from previous day: 05/10 0701 - 05/11 0700 In: 2392.9 [P.O.:120; I.V.:2272.9] Out: 3300 [Urine:3300] Intake/Output from this shift:    Labs:  Recent Labs  07/14/13 0128 07/14/13 0131 07/15/13 0652  WBC 6.6  --  6.0  HGB 11.8* 13.3 11.6*  PLT 236  --  210  CREATININE 0.76 0.80 0.74   Estimated Creatinine Clearance: 96.5 ml/min (by C-G formula based on Cr of 0.74). No results found for this basename: VANCOTROUGH, Corlis Leak, VANCORANDOM, GENTTROUGH, GENTPEAK, GENTRANDOM, TOBRATROUGH, TOBRAPEAK, TOBRARND, AMIKACINPEAK, AMIKACINTROU, AMIKACIN,  in the last 72 hours   Microbiology: Recent Results (from the past 720 hour(s))  SURGICAL PCR SCREEN     Status: None   Collection Time    07/15/13  5:52 AM      Result Value Ref Range Status   MRSA, PCR NEGATIVE  NEGATIVE Final   Staphylococcus aureus NEGATIVE  NEGATIVE Final   Comment:            The Xpert SA Assay (FDA     approved for NASAL specimens     in patients over 23 years of age),     is one component of     a comprehensive surveillance     program.  Test performance has     been validated by [employer] for patients greater     than or equal to 31 year old.     It is not intended     to diagnose infection nor to     guide or monitor treatment.    Medical History: Past Medical History  Diagnosis Date  . Anemia     has history, takes iron  . Headache(784.0)   . Diverticulitis     no problems  . GERD (gastroesophageal reflux disease)     tums prn  . Hemorrhoids     Medications:  Scheduled:  .  antiseptic oral rinse  15 mL Mouth Rinse q12n4p  . chlorhexidine  15 mL Mouth Rinse BID  . HYDROmorphone      . oxyCODONE      . oxyCODONE-acetaminophen      . pantoprazole (PROTONIX) IV  40 mg Intravenous QHS  . piperacillin-tazobactam (ZOSYN)  IV  3.375 g Intravenous 3 times per day   Assessment:   48 yr old female who presented to the ED with abdominal pain and vomiting, then found to have acute cholecystitis. jShe was taken to the OR for removal. Pharmacy was asked to dose Zosyn post op.  Goal of herapy:  Prevention of infection post op  Plan:  Zosyn 3.375 mg IV q8hr.  Ardith Dark Sakari Alkhatib 07/15/2013,7:14 PM

## 2013-07-15 NOTE — Progress Notes (Signed)
Subjective: Still with some ruq pain  Objective: Vital signs in last 24 hours: Temp:  [98.1 F (36.7 C)-99.2 F (37.3 C)] 99.2 F (37.3 C) (05/11 0438) Pulse Rate:  [79-85] 85 (05/11 0438) Resp:  [16-20] 16 (05/11 0438) BP: (107-151)/(64-91) 107/64 mmHg (05/11 0438) SpO2:  [98 %] 98 % (05/11 0438) Last BM Date: 07/13/13  Intake/Output from previous day: 05/10 0701 - 05/11 0700 In: 2392.9 [P.O.:120; I.V.:2272.9] Out: 3300 [Urine:3300] Intake/Output this shift:    General appearance: no distress GI: ruq tenderness  Lab Results:   Recent Labs  07/14/13 0128 07/14/13 0131 07/15/13 0652  WBC 6.6  --  6.0  HGB 11.8* 13.3 11.6*  HCT 36.1 39.0 35.1*  PLT 236  --  210   BMET  Recent Labs  07/14/13 0128 07/14/13 0131 07/15/13 0652  NA 142 142 141  K 3.6* 3.5* 3.5*  CL 102 100 105  CO2 25  --  23  GLUCOSE 126* 125* 96  BUN 10 9 5*  CREATININE 0.76 0.80 0.74  CALCIUM 8.9  --  8.2*   PT/INR  Recent Labs  07/14/13 0128  LABPROT 12.2  INR 0.92   ABG No results found for this basename: PHART, PCO2, PO2, HCO3,  in the last 72 hours  Studies/Results: Ct Abdomen Pelvis W Contrast  07/14/2013   CLINICAL DATA:  Abdominal pain, hematochezia, vomiting.  EXAM: CT ABDOMEN AND PELVIS WITH CONTRAST  TECHNIQUE: Multidetector CT imaging of the abdomen and pelvis was performed using the standard protocol following bolus administration of intravenous contrast.  CONTRAST:  133mL OMNIPAQUE IOHEXOL 300 MG/ML  SOLN  COMPARISON:  None.  FINDINGS: Included view of the lung bases demonstrate dependent atelectasis. . Visualized heart and pericardium are unremarkable.  Gallbladder is distended, containing a 2.5 cm gallstone at the neck, with gallbladder wall thickening and mild pericholecystic fluid. Additional 2.2 cm gallstone within the fundus. The liver, spleen, gallbladder, pancreas and adrenal glands are unremarkable.  The stomach, small and large bowel are normal in course and  caliber without inflammatory changes. Enteric contrast has not yet reached the distal small bowel. Normal appendix. Small amount of free fluid in the pelvis is likely reactive. No pneumoperitoneum.  Kidneys are orthotopic, demonstrating symmetric enhancement without nephrolithiasis, hydronephrosis or renal masses. The unopacified ureters are normal in course and caliber. Delayed imaging through the kidneys demonstrates symmetric prompt excretion to the proximal urinary collecting system. Urinary bladder is partially distended and unremarkable.  Aortoiliac vessels are normal in course and caliber. No lymphadenopathy by CT size criteria. 3.9 cm low-density left adnexal cyst. Status post hysterectomy. The soft tissues and included osseous structures are nonsuspicious.  IMPRESSION: Cholelithiasis and acute cholecystitis, with findings concerning for impacted gallstone at the neck. Small amount of free fluid in the pelvis is likely reactive.   Electronically Signed   By: Elon Alas   On: 07/14/2013 06:15    Anti-infectives: Anti-infectives   Start     Dose/Rate Route Frequency Ordered Stop   07/14/13 1300  Ampicillin-Sulbactam (UNASYN) 3 g in sodium chloride 0.9 % 100 mL IVPB     3 g 100 mL/hr over 60 Minutes Intravenous Every 6 hours 07/14/13 1113     07/14/13 0715  ampicillin-sulbactam (UNASYN) 1.5 g in sodium chloride 0.9 % 50 mL IVPB     1.5 g 100 mL/hr over 30 Minutes Intravenous  Once 07/14/13 6644 07/14/13 0912      Assessment/Plan: Cholecystitis  Will await echo today I discussed the procedure  in detail. We discussed the risks and benefits of a laparoscopic cholecystectomy and possible cholangiogram including, but not limited to bleeding, infection, injury to surrounding structures such as the intestine or liver, bile leak, retained gallstones, need to convert to an open procedure, prolonged diarrhea, blood clots such as  DVT, common bile duct injury, anesthesia risks, and possible need  for additional procedures.  The likelihood of improvement in symptoms and return to the patient's normal status is good. We discussed the typical post-operative recovery course.   Rolm Bookbinder 07/15/2013

## 2013-07-15 NOTE — Transfer of Care (Signed)
Immediate Anesthesia Transfer of Care Note  [name]  Procedure(s) Performed: Procedure(s): LAPAROSCOPIC CHOLECYSTECTOMY (N/A)  Patient Location: PACU  Anesthesia Type:General  Level of Consciousness: awake, alert  and oriented  Airway & Oxygen Therapy: Patient Spontanous Breathing and Patient connected to nasal cannula oxygen  Post-op Assessment: Report given to PACU RN and Post -op Vital signs reviewed and stable  Post vital signs: Reviewed and stable  Complications: No apparent anesthesia complications

## 2013-07-15 NOTE — Progress Notes (Signed)
Echocardiogram 2D Echocardiogram has been performed.  Deborah Haney 07/15/2013, 9:27 AM

## 2013-07-15 NOTE — Progress Notes (Signed)
Report called to [facility] in short stay.  CHG bath, consent, PCR all completed. Deborah Haney

## 2013-07-16 ENCOUNTER — Encounter (HOSPITAL_COMMUNITY): Payer: Self-pay | Admitting: General Practice

## 2013-07-16 LAB — BASIC METABOLIC PANEL
BUN: 5 mg/dL — ABNORMAL LOW (ref 6–23)
CO2: 24 mEq/L (ref 19–32)
Calcium: 7.8 mg/dL — ABNORMAL LOW (ref 8.4–10.5)
Chloride: 104 mEq/L (ref 96–112)
Creatinine, Ser: 0.79 mg/dL (ref 0.50–1.10)
GFR calc Af Amer: >90 mL/min (ref >90)
GFR calc non Af Amer: >90 mL/min (ref >90)
Glucose, Bld: 99 mg/dL (ref 70–99)
Potassium: 3.7 mEq/L (ref 3.7–5.3)
Sodium: 136 mEq/L — ABNORMAL LOW (ref 137–147)

## 2013-07-16 LAB — CBC
HCT: 32.7 % — ABNORMAL LOW (ref 36.0–46.0)
Hemoglobin: 10.7 g/dL — ABNORMAL LOW (ref 12.0–15.0)
MCH: 29.4 pg (ref 26.0–34.0)
MCHC: 32.7 g/dL (ref 30.0–36.0)
MCV: 89.8 fL (ref 78.0–100.0)
Platelets: 217 10*3/uL (ref 150–400)
RBC: 3.64 MIL/uL — ABNORMAL LOW (ref 3.87–5.11)
RDW: 14.7 % (ref 11.5–15.5)
WBC: 6.6 10*3/uL (ref 4.0–10.5)

## 2013-07-16 MED ORDER — ENOXAPARIN SODIUM 40 MG/0.4ML ~~LOC~~ SOLN
40.0000 mg | SUBCUTANEOUS | Status: DC
  Administered 2013-07-16: 40 mg via SUBCUTANEOUS
  Filled 2013-07-16 (×2): qty 0.4

## 2013-07-16 MED ORDER — SODIUM CHLORIDE 0.9 % IJ SOLN
3.0000 mL | Freq: Two times a day (BID) | INTRAMUSCULAR | Status: DC
  Administered 2013-07-16: 3 mL via INTRAVENOUS

## 2013-07-16 MED ORDER — SODIUM CHLORIDE 0.9 % IJ SOLN: 3.0000 mL | INTRAMUSCULAR | Status: DC | PRN

## 2013-07-16 NOTE — Progress Notes (Signed)
Agree with above 

## 2013-07-16 NOTE — Progress Notes (Signed)
Did well with surgery .Marland Kitchen As there is no known CAD, cardiology follow-up is as needed.  Will sign-off. Call with questions.  Pixie Casino, MD, [facility] Attending Cardiologist Roundup

## 2013-07-16 NOTE — Progress Notes (Signed)
Patient ID: Deborah Haney, female   DOB: 01/02/1966, 48 y.o.   MRN: [id number]   Subjective: Voiding.  No flatus.  Tolerating diet.  No n/v.  Ambulated to the bathroom.    Objective:  Vital signs:  Filed Vitals:   07/15/13 1832 07/15/13 2204 07/16/13 0222 07/16/13 0516  BP: 132/82 121/81 117/73 124/74  Pulse: 87 88 78 72  Temp: 98.5 F (36.9 C) 98 F (36.7 C) 98.7 F (37.1 C) 98.1 F (36.7 C)  TempSrc: Oral Oral Oral Oral  Resp: '18 18 17 16  ' Height:      Weight:      SpO2: 96% 96% 96% 97%    Last BM Date: 07/13/13  Intake/Output   Yesterday:  05/11 0701 - 05/12 0700 In: 2448 [P.O.:340; I.V.:2108] Out: 1750 [Urine:1725; Blood:25] This shift:    I/O last 3 completed shifts: In: 4840.9 [P.O.:460; I.V.:4380.9] Out: 3750 [Urine:3725; Blood:25]    Physical Exam: General: Pt awake/alert/oriented x4 in noacute distress Chest: cta.  No chest wall pain w good excursion CV:  Pulses intact.  Regular rhythm MS: Normal AROM mjr joints.  No obvious deformity Abdomen: Soft.  Nondistended.  Appropriately tender.  incisions are c/d/i. No evidence of peritonitis.  No incarcerated hernias. Ext:  SCDs BLE.  No mjr edema.  No cyanosis Skin: No petechiae / purpura   Problem List:   Principal Problem:   Chest pain Active Problems:   Nausea & vomiting   Acute cholecystitis    Results:   Labs: Results for orders placed during the hospital encounter of 07/14/13 (from the past 48 hour(s))  I-STAT TROPOININ, ED     Status: None   Collection Time    07/14/13  8:54 AM      Result Value Ref Range   Troponin i, poc 0.00  0.00 - 0.08 ng/mL   Comment 3            Comment: Due to the release kinetics of cTnI,     a negative result within the first hours     of the onset of symptoms does not rule out     myocardial infarction with certainty.     If myocardial infarction is still suspected,     repeat the test at appropriate intervals.  SURGICAL PCR SCREEN     Status: None   Collection Time    07/15/13  5:52 AM      Result Value Ref Range   MRSA, PCR NEGATIVE  NEGATIVE   Staphylococcus aureus NEGATIVE  NEGATIVE   Comment:            The Xpert SA Assay (FDA     approved for NASAL specimens     in patients over 50 years of age),     is one component of     a comprehensive surveillance     program.  Test performance has     been validated by [employer] for patients greater     than or equal to 71 year old.     It is not intended     to diagnose infection nor to     guide or monitor treatment.  COMPREHENSIVE METABOLIC PANEL     Status: Abnormal   Collection Time    07/15/13  6:52 AM      Result Value Ref Range   Sodium 141  137 - 147 mEq/L   Potassium 3.5 (*) 3.7 - 5.3 mEq/L  Chloride 105  96 - 112 mEq/L   CO2 23  19 - 32 mEq/L   Glucose, Bld 96  70 - 99 mg/dL   BUN 5 (*) 6 - 23 mg/dL   Creatinine, Ser 0.74  0.50 - 1.10 mg/dL   Calcium 8.2 (*) 8.4 - 10.5 mg/dL   Total Protein 6.8  6.0 - 8.3 g/dL   Albumin 3.0 (*) 3.5 - 5.2 g/dL   AST 14  0 - 37 U/L   ALT 11  0 - 35 U/L   Alkaline Phosphatase 49  39 - 117 U/L   Total Bilirubin 0.9  0.3 - 1.2 mg/dL   GFR calc non Af Amer >90  >90 mL/min   GFR calc Af Amer >90  >90 mL/min   Comment: (NOTE)     The eGFR has been calculated using the CKD EPI equation.     This calculation has not been validated in all clinical situations.     eGFR's persistently <90 mL/min signify possible Chronic Kidney     Disease.  CBC     Status: Abnormal   Collection Time    07/15/13  6:52 AM      Result Value Ref Range   WBC 6.0  4.0 - 10.5 K/uL   RBC 3.95  3.87 - 5.11 MIL/uL   Hemoglobin 11.6 (*) 12.0 - 15.0 g/dL   HCT 35.1 (*) 36.0 - 46.0 %   MCV 88.9  78.0 - 100.0 fL   MCH 29.4  26.0 - 34.0 pg   MCHC 33.0  30.0 - 36.0 g/dL   RDW 14.7  11.5 - 15.5 %   Platelets 210  150 - 400 K/uL  CBC     Status: Abnormal   Collection Time    07/16/13  3:46 AM      Result Value Ref Range   WBC 6.6  4.0 - 10.5 K/uL   RBC  3.64 (*) 3.87 - 5.11 MIL/uL   Hemoglobin 10.7 (*) 12.0 - 15.0 g/dL   HCT 32.7 (*) 36.0 - 46.0 %   MCV 89.8  78.0 - 100.0 fL   MCH 29.4  26.0 - 34.0 pg   MCHC 32.7  30.0 - 36.0 g/dL   RDW 14.7  11.5 - 15.5 %   Platelets 217  150 - 400 K/uL  BASIC METABOLIC PANEL     Status: Abnormal   Collection Time    07/16/13  3:46 AM      Result Value Ref Range   Sodium 136 (*) 137 - 147 mEq/L   Potassium 3.7  3.7 - 5.3 mEq/L   Chloride 104  96 - 112 mEq/L   CO2 24  19 - 32 mEq/L   Glucose, Bld 99  70 - 99 mg/dL   BUN 5 (*) 6 - 23 mg/dL   Creatinine, Ser 0.79  0.50 - 1.10 mg/dL   Calcium 7.8 (*) 8.4 - 10.5 mg/dL   GFR calc non Af Amer >90  >90 mL/min   GFR calc Af Amer >90  >90 mL/min   Comment: (NOTE)     The eGFR has been calculated using the CKD EPI equation.     This calculation has not been validated in all clinical situations.     eGFR's persistently <90 mL/min signify possible Chronic Kidney     Disease.    Imaging / Studies: No results found.  Medications / Allergies: per chart  Antibiotics: Anti-infectives   Start     Dose/Rate  Route Frequency Ordered Stop   07/15/13 2000  piperacillin-tazobactam (ZOSYN) IVPB 3.375 g     3.375 g 12.5 mL/hr over 240 Minutes Intravenous 3 times per day 07/15/13 1836     07/14/13 1300  Ampicillin-Sulbactam (UNASYN) 3 g in sodium chloride 0.9 % 100 mL IVPB  Status:  Discontinued     3 g 100 mL/hr over 60 Minutes Intravenous Every 6 hours 07/14/13 1113 07/15/13 1828   07/14/13 0715  ampicillin-sulbactam (UNASYN) 1.5 g in sodium chloride 0.9 % 50 mL IVPB     1.5 g 100 mL/hr over 30 Minutes Intravenous  Once 07/14/13 0713 07/14/13 0912      Assessment/Plan Acute cholecystitis S/p lap cholecystectomy---Dr. Donne Hazel 07/15/13 POD#1 -Continue with IV antibiotics -Mobilize  -IS -SLIV -SCD/lovenox -Pain control -keep her overnight for IV atbx since she lives alone and does not appear quite ready to go home.  Discharge tomorrow.    Erby Pian, [facility] Surgery Pager [phone] Office [phone]  07/16/2013 8:36 AM

## 2013-07-17 MED ORDER — OXYCODONE-ACETAMINOPHEN 5-325 MG PO TABS: 1.0000 | ORAL_TABLET | Freq: Four times a day (QID) | ORAL | Status: DC | PRN

## 2013-07-17 NOTE — Discharge Instructions (Signed)

## 2013-07-17 NOTE — Discharge Summary (Signed)
Agree 

## 2013-07-17 NOTE — Progress Notes (Signed)
Deborah Haney to be D/C'd Home per MD order.  Discussed with the patient and all questions fully answered.    Medication List         ibuprofen 200 MG tablet  Commonly known as:  ADVIL,MOTRIN  Take 400 mg by mouth every 6 (six) hours as needed. For pain     oxyCODONE-acetaminophen 5-325 MG per tablet  Commonly known as:  PERCOCET/ROXICET  Take 1 tablet by mouth every 6 (six) hours as needed for moderate pain.     Vitamin D 2000 UNITS Caps  Take 1 capsule by mouth daily.        VSS, Skin clean, dry and intact without evidence of skin break down, no evidence of skin tears noted. Incision sites clean, dry intact without sign of infection.   IV catheter discontinued intact. Site without signs and symptoms of complications. Dressing and pressure applied.  An After Visit Summary was printed and given to the patient.  D/c education completed with patient/family including follow up instructions, medication list, d/c activities limitations if indicated, with other d/c instructions as indicated by MD - patient able to verbalize understanding, all questions fully answered. Patient received prescription.  Patient instructed to return to ED, call 911, or call MD for any changes in condition.   Patient escorted via [place], and D/C home via private auto.  Micki Riley 07/17/2013 11:01 AM

## 2013-07-17 NOTE — Discharge Summary (Signed)
Physician Discharge Summary  Deborah Haney ZOX:[id number] DOB: 1965/11/27 DOA: 07/14/2013  PCP: Pcp Not In System  Consultation: none  Admit date: 07/14/2013 Discharge date: 07/17/2013  Recommendations for Outpatient Follow-up:   Follow-up Information   Follow up with [facility] On 08/06/2013. (arrive by 3:15PM for a 3:45PM for a post op check)    Contact information:   [address]   [place] [address] [phone]      Discharge Diagnoses:  1. Acute cholecystitis    Surgical Procedure: laparoscopic cholecystectomy---Dr. Donne Hazel 07/15/13  Discharge Condition: stable Disposition: home  Diet recommendation: regular  Filed Weights   07/14/13 0044 07/14/13 1119  Weight: 178 lb (80.74 kg) 182 lb 14.4 oz (82.963 kg)    Filed Vitals:   07/17/13 0555  BP: 123/73  Pulse: 76  Temp: 98.1 F (36.7 C)  Resp: 18    Hospital Course:  Deborah Haney is a 48 year old female who presented to [facility] with abdominal pain.  She was found to have acute cholecystitis.  She also complained of chest pains and a cardiology consultation was obtained.  An echocardiogram did not reveal any abnormalities and therefore we proceeded with surgery.  She tolerated the procedure well and was transferred to the floor.  On POD#1 the patient was not mobilizing well, had some pain and therefore was kept in the hospital on IV antibiotics.  Her vital signs remained stable. She did not have additional cardiac symptoms.   Labs were stable. On POD #2 the patient was tolerating a diet, ambulating, pain well controlled, viding and therefore felt stable for discharge. Medication risks, benefits and therapeutic alternatives were reviewed with the patient.  She verbalizes understanding. A follow up has been made on her behalf and she is aware.  She was encouraged to call with any questions or concerns.   Physical Exam:  General: Pt awake/alert/oriented x4 in noacute distress  Chest: cta. No  chest wall pain w good excursion  CV: Pulses intact. Regular rhythm  MS: Normal AROM mjr joints. No obvious deformity  Abdomen: Soft. Nondistended. Appropriately tender. incisions are c/d/i. No evidence of peritonitis. No incarcerated hernias.  Ext: SCDs BLE. No mjr edema. No cyanosis  Skin: No petechiae / purpura   Discharge Instructions   Future Appointments Provider Department Dept Phone   08/06/2013 3:45 PM [facility], [place] [phone]       Medication List         ibuprofen 200 MG tablet  Commonly known as:  ADVIL,MOTRIN  Take 400 mg by mouth every 6 (six) hours as needed. For pain     oxyCODONE-acetaminophen 5-325 MG per tablet  Commonly known as:  PERCOCET/ROXICET  Take 1 tablet by mouth every 6 (six) hours as needed for moderate pain.     Vitamin D 2000 UNITS Caps  Take 1 capsule by mouth daily.           Follow-up Information   Follow up with [facility] On 08/06/2013. (arrive by 3:15PM for a 3:45PM for a post op check)    Contact information:   [place] [address] [phone]        The results of significant diagnostics from this hospitalization (including imaging, microbiology, ancillary and laboratory) are listed below for reference.    Significant Diagnostic Studies: Ct Abdomen Pelvis W Contrast  07/14/2013  CLINICAL DATA:  Abdominal pain, hematochezia, vomiting.  EXAM: CT ABDOMEN AND PELVIS WITH CONTRAST  TECHNIQUE: Multidetector CT imaging of the abdomen and pelvis was performed using the standard protocol following bolus administration of intravenous contrast.  CONTRAST:  161mL OMNIPAQUE IOHEXOL 300 MG/ML  SOLN  COMPARISON:  None.  FINDINGS: Included view of the lung bases demonstrate dependent atelectasis. . Visualized heart and pericardium are unremarkable.  Gallbladder is distended, containing a 2.5 cm gallstone at the neck, with gallbladder wall thickening and mild  pericholecystic fluid. Additional 2.2 cm gallstone within the fundus. The liver, spleen, gallbladder, pancreas and adrenal glands are unremarkable.  The stomach, small and large bowel are normal in course and caliber without inflammatory changes. Enteric contrast has not yet reached the distal small bowel. Normal appendix. Small amount of free fluid in the pelvis is likely reactive. No pneumoperitoneum.  Kidneys are orthotopic, demonstrating symmetric enhancement without nephrolithiasis, hydronephrosis or renal masses. The unopacified ureters are normal in course and caliber. Delayed imaging through the kidneys demonstrates symmetric prompt excretion to the proximal urinary collecting system. Urinary bladder is partially distended and unremarkable.  Aortoiliac vessels are normal in course and caliber. No lymphadenopathy by CT size criteria. 3.9 cm low-density left adnexal cyst. Status post hysterectomy. The soft tissues and included osseous structures are nonsuspicious.  IMPRESSION: Cholelithiasis and acute cholecystitis, with findings concerning for impacted gallstone at the neck. Small amount of free fluid in the pelvis is likely reactive.   Electronically Signed   By: Elon Alas   On: 07/14/2013 06:15    Microbiology: Recent Results (from the past 240 hour(s))  SURGICAL PCR SCREEN     Status: None   Collection Time    07/15/13  5:52 AM      Result Value Ref Range Status   MRSA, PCR NEGATIVE  NEGATIVE Final   Staphylococcus aureus NEGATIVE  NEGATIVE Final   Comment:            The Xpert SA Assay (FDA     approved for NASAL specimens     in patients over 29 years of age),     is one component of     a comprehensive surveillance     program.  Test performance has     been validated by [employer] for patients greater     than or equal to 30 year old.     It is not intended     to diagnose infection nor to     guide or monitor treatment.     Labs: Basic Metabolic Panel:  Recent  Labs Lab 07/14/13 0128 07/14/13 0131 07/15/13 0652 07/16/13 0346  NA 142 142 141 136*  K 3.6* 3.5* 3.5* 3.7  CL 102 100 105 104  CO2 25  --  23 24  GLUCOSE 126* 125* 96 99  BUN 10 9 5* 5*  CREATININE 0.76 0.80 0.74 0.79  CALCIUM 8.9  --  8.2* 7.8*   Liver Function Tests:  Recent Labs Lab 07/14/13 0128 07/15/13 0652  AST 15 14  ALT 13 11  ALKPHOS 49 49  BILITOT 0.4 0.9  PROT 7.8 6.8  ALBUMIN 3.6 3.0*    Recent Labs Lab 07/14/13 0128  LIPASE 33  AMYLASE 120*   No results found for this basename: AMMONIA,  in the last 168 hours CBC:  Recent Labs Lab 07/14/13 0128 07/14/13 0131 07/15/13 0652 07/16/13 0346  WBC 6.6  --  6.0 6.6  NEUTROABS  3.8  --   --   --   HGB 11.8* 13.3 11.6* 10.7*  HCT 36.1 39.0 35.1* 32.7*  MCV 89.6  --  88.9 89.8  PLT 236  --  210 217   Cardiac Enzymes: No results found for this basename: CKTOTAL, CKMB, CKMBINDEX, TROPONINI,  in the last 168 hours BNP: BNP (last 3 results) No results found for this basename: PROBNP,  in the last 8760 hours CBG: No results found for this basename: GLUCAP,  in the last 168 hours  Principal Problem:   Chest pain Active Problems:   Nausea & vomiting   Acute cholecystitis   Time coordinating discharge: <30 mins  Signed:  Boris Engelmann, ANP-BC

## 2013-07-19 ENCOUNTER — Encounter (HOSPITAL_COMMUNITY): Payer: Self-pay | Admitting: General Surgery

## 2013-08-06 ENCOUNTER — Ambulatory Visit (INDEPENDENT_AMBULATORY_CARE_PROVIDER_SITE_OTHER): Payer: Self-pay | Admitting: General Surgery

## 2013-08-06 ENCOUNTER — Encounter (INDEPENDENT_AMBULATORY_CARE_PROVIDER_SITE_OTHER): Payer: Self-pay

## 2013-08-06 VITALS — BP 105/80 | HR 59 | Temp 98.4°F | Resp 12 | Ht 66.0 in | Wt 174.6 lb

## 2013-08-06 DIAGNOSIS — K810 Acute cholecystitis: Secondary | ICD-10-CM

## 2013-08-06 NOTE — Patient Instructions (Addendum)
Miralax for acute constipation Metamucil or Citrucel for chronic constipation See your primary care doctor if constipation is an issue. Call us back if you have problems.

## 2013-08-06 NOTE — Progress Notes (Signed)
Deborah Haney March 26, 1965 [id number] 08/06/2013   Deborah Haney is a 48 y.o. female who had a laparoscopic cholecystectomy with intraoperative cholangiogram by Dr. Rolm Bookbinder.  The pathology report confirmed  Gallbladder - CHRONIC CHOLECYSTITIS AND CHOLELITHIASIS. - THERE IS NO EVIDENCE OF MALIGNANCY..  The patient reports that they are feeling well with normal bowel movements and good appetite.  The pre-operative symptoms of abdominal pain, nausea, and vomiting have resolved.    Physical examination - BP 105/80  Pulse 59  Temp(Src) 98.4 F (36.9 C)  Resp 12  Ht 5\' 6"  (1.676 m)  Wt 79.198 kg (174 lb 9.6 oz)  BMI 28.19 kg/m2  LMP 04/25/2011  Incisions appear well-healed with no sign of infection or bleeding.   Abdomen - soft, non-tender  Impression:  s/p laparoscopic cholecystectomy  Chronic constipation Plan:  She may resume a regular diet and full activity.  She may follow-up on a PRN basis.

## 2014-01-06 ENCOUNTER — Encounter (INDEPENDENT_AMBULATORY_CARE_PROVIDER_SITE_OTHER): Payer: Self-pay

## 2014-05-25 ENCOUNTER — Encounter (HOSPITAL_COMMUNITY): Payer: Self-pay | Admitting: *Deleted

## 2014-05-25 ENCOUNTER — Emergency Department (INDEPENDENT_AMBULATORY_CARE_PROVIDER_SITE_OTHER)
Admission: EM | Admit: 2014-05-25 | Discharge: 2014-05-25 | Disposition: A | Discharge status: Home / Self Care | Payer: BLUE CROSS/BLUE SHIELD | Source: Home / Self Care | Attending: Family Medicine | Admitting: Family Medicine

## 2014-05-25 DIAGNOSIS — J029 Acute pharyngitis, unspecified: Secondary | ICD-10-CM

## 2014-05-25 LAB — POCT RAPID STREP A: Streptococcus, Group A Screen (Direct): NEGATIVE

## 2014-05-25 MED ORDER — FLUTICASONE PROPIONATE 50 MCG/ACT NA SUSP: 2.0000 | Freq: Every day | NASAL | Status: DC

## 2014-05-25 NOTE — ED Provider Notes (Signed)
Deborah Haney is a 50 y.o. female who presents to Urgent Care today for sore throat. Patient is a 2 day history of sore throat associated with runny nose and congestion. She has tried Hall's cough drops. These have not worked well. She denies any trouble breathing cough fevers or chills. She denies any chest pains or palpitations. She's been told before that her blood pressure is elevated. She does not have a primary care provider but would like one.    Past Medical History  Diagnosis Date  . Anemia     has history, takes iron  . Headache(784.0)   . Diverticulitis     no problems  . GERD (gastroesophageal reflux disease)     tums prn  . Hemorrhoids    Past Surgical History  Procedure Laterality Date  . Ectopic pregnancy surgery      laparotomy  . Cholecystectomy  07/15/2013  . Abdominal hysterectomy    . Cholecystectomy N/A 07/15/2013    Procedure: LAPAROSCOPIC CHOLECYSTECTOMY;  Surgeon: Rolm Bookbinder, MD;  [facility];  Service: General;  Laterality: N/A;   History  Substance Use Topics  . Smoking status: Former Smoker    Types: Cigarettes    Quit date: 04/18/2008  . Smokeless tobacco: Never Used  . Alcohol Use: No   ROS as above Medications: No current facility-administered medications for this encounter.   Current Outpatient Prescriptions  Medication Sig Dispense Refill  . fluticasone (FLONASE) 50 MCG/ACT nasal spray Place 2 sprays into both nostrils daily. 16 g 2  . [DISCONTINUED] Norgestimate-Ethinyl Estradiol Triphasic (ORTHO TRI-CYCLEN,TRINESSA,TRI-SPRINTEC) 0.18/0.215/0.25 MG-35 MCG tablet Take 1 tablet by mouth daily. 1 Package 11   No Known Allergies   Exam:  BP 181/99 mmHg  Pulse 91  Temp(Src) 98.2 F (36.8 C) (Oral)  Resp 18  SpO2 98%  LMP 04/25/2011 Gen: Well NAD HEENT: EOMI,  MMM posterior pharynx cobblestoning. Clear nasal discharge. Inflamed nasal turbinates bilaterally. No cervical lymphadenopathy is present. Lungs: Normal work of  breathing. CTABL Heart: RRR no MRG Abd: NABS, Soft. Nondistended, Nontender Exts: Brisk capillary refill, warm and well perfused.   Results for orders placed or performed during the hospital encounter of 05/25/14 (from the past 24 hour(s))  POCT rapid strep A [facility])     Status: None   Collection Time: 05/25/14  3:30 PM  Result Value Ref Range   Streptococcus, Group A Screen (Direct) NEGATIVE NEGATIVE   No results found.  Assessment and Plan: 49 y.o. female with  1) pharyngitis likely due to postnasal drip due to seasonal allergies. Treat with Flonase nasal spray and cetirizine 2) hypertension. Patient does not want to start blood pressure medicine today. Follow-up with PCP.  Discussed warning signs or symptoms. Please see discharge instructions. Patient expresses understanding.     Gregor Hams, MD 05/25/14 [phone]

## 2014-05-25 NOTE — ED Notes (Signed)
C/O sore throat x 2 days without fever.  Denies any other sxs.  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  Has tried Halls and salt water gargles without relief.

## 2014-05-25 NOTE — Discharge Instructions (Signed)
Thank you for coming in today. Take two extra strength Tylenol every 6 hours as needed for pain and fever Take over-the-counter cetirizine (Zyrtec) daily Use Flonase nasal spray as directed Follow-up with a primary care provider for blood pressure. Call or go to the emergency room if you get worse, have trouble breathing, have chest pains, or palpitations.    Pharyngitis Pharyngitis is redness, pain, and swelling (inflammation) of your pharynx.  CAUSES  Pharyngitis is usually caused by infection. Most of the time, these infections are from viruses (viral) and are part of a cold. However, sometimes pharyngitis is caused by bacteria (bacterial). Pharyngitis can also be caused by allergies. Viral pharyngitis may be spread from person to person by coughing, sneezing, and personal items or utensils (cups, forks, spoons, toothbrushes). Bacterial pharyngitis may be spread from person to person by more intimate contact, such as kissing.  SIGNS AND SYMPTOMS  Symptoms of pharyngitis include:   Sore throat.   Tiredness (fatigue).   Low-grade fever.   Headache.  Joint pain and muscle aches.  Skin rashes.  Swollen lymph nodes.  Plaque-like film on throat or tonsils (often seen with bacterial pharyngitis). DIAGNOSIS  Your health care provider will ask you questions about your illness and your symptoms. Your medical history, along with a physical exam, is often all that is needed to diagnose pharyngitis. Sometimes, a rapid strep test is done. Other lab tests may also be done, depending on the suspected cause.  TREATMENT  Viral pharyngitis will usually get better in 3-4 days without the use of medicine. Bacterial pharyngitis is treated with medicines that kill germs (antibiotics).  HOME CARE INSTRUCTIONS   Drink enough water and fluids to keep your urine clear or pale yellow.   Only take over-the-counter or prescription medicines as directed by your health care provider:   If you are  prescribed antibiotics, make sure you finish them even if you start to feel better.   Do not take aspirin.   Get lots of rest.   Gargle with 8 oz of salt water ( tsp of salt per 1 qt of water) as often as every 1-2 hours to soothe your throat.   Throat lozenges (if you are not at risk for choking) or sprays may be used to soothe your throat. SEEK MEDICAL CARE IF:   You have large, tender lumps in your neck.  You have a rash.  You cough up green, yellow-brown, or bloody spit. SEEK IMMEDIATE MEDICAL CARE IF:   Your neck becomes stiff.  You drool or are unable to swallow liquids.  You vomit or are unable to keep medicines or liquids down.  You have severe pain that does not go away with the use of recommended medicines.  You have trouble breathing (not caused by a stuffy nose). MAKE SURE YOU:   Understand these instructions.  Will watch your condition.  Will get help right away if you are not doing well or get worse. Document Released: 02/21/2005 Document Revised: 12/12/2012 Document Reviewed: 10/29/2012 [facility] Patient Information 2015 [place], [place]. This information is not intended to replace advice given to you by your health care provider. Make sure you discuss any questions you have with your health care provider.   PRIMARY CARE Paramedic at [place], [place] Ph [phone]  Fax [phone]  Therapist, music at [facility] [address] [place], [place] Ph [phone]  Fax [phone]  Therapist, music at [place] /  [place], [place] Ph [phone]  Fax [phone]  [facility] at [facility] [address] [place]  [place], [place] Ph [phone]  Fax [phone]  [place] [address] [place] [address] [place], [place] Ph [phone]   Fax [phone]  [facility] at [facility] [place], [place] Ph [phone]  Fax [phone]   [place] @ [place] [address] Phone: [phone]   [place] @ [facility] [place]. [place] [address] Phone: [place] @ [address] [place] [address] Phone: [place] @ [name]. [place] [address] Phone: [place] @ [place]. [employer], [place] [address] Phone: [phone]   [place] @ [place] [address] [place] [address] Phone: [phone]   Dr. Rachell Cipro [address] [place] [address] [phone]

## 2014-05-28 LAB — CULTURE, GROUP A STREP: Strep A Culture: NEGATIVE

## 2014-07-22 ENCOUNTER — Other Ambulatory Visit: Payer: Self-pay | Admitting: Gastroenterology

## 2014-07-28 ENCOUNTER — Other Ambulatory Visit: Payer: Self-pay

## 2014-07-28 DIAGNOSIS — Z1231 Encounter for screening mammogram for malignant neoplasm of breast: Secondary | ICD-10-CM

## 2014-08-01 ENCOUNTER — Ambulatory Visit
Admission: RE | Admit: 2014-08-01 | Discharge: 2014-08-01 | Disposition: A | Discharge status: Home / Self Care | Payer: BLUE CROSS/BLUE SHIELD | Source: Ambulatory Visit

## 2014-08-01 DIAGNOSIS — Z1231 Encounter for screening mammogram for malignant neoplasm of breast: Secondary | ICD-10-CM

## 2014-11-19 ENCOUNTER — Other Ambulatory Visit (HOSPITAL_COMMUNITY)
Admission: RE | Admit: 2014-11-19 | Discharge: 2014-11-19 | Disposition: A | Discharge status: Home / Self Care | Payer: 59 | Source: Ambulatory Visit | Attending: Internal Medicine | Admitting: Internal Medicine

## 2014-11-19 ENCOUNTER — Other Ambulatory Visit: Payer: Self-pay | Admitting: Internal Medicine

## 2014-11-19 DIAGNOSIS — Z1151 Encounter for screening for human papillomavirus (HPV): Secondary | ICD-10-CM | POA: Insufficient documentation

## 2014-11-19 DIAGNOSIS — Z01419 Encounter for gynecological examination (general) (routine) without abnormal findings: Secondary | ICD-10-CM | POA: Insufficient documentation

## 2014-11-21 LAB — CYTOLOGY - PAP

## 2014-12-17 ENCOUNTER — Encounter (HOSPITAL_COMMUNITY): Payer: Self-pay

## 2015-02-25 ENCOUNTER — Encounter (HOSPITAL_COMMUNITY): Payer: Self-pay

## 2015-02-25 ENCOUNTER — Emergency Department (INDEPENDENT_AMBULATORY_CARE_PROVIDER_SITE_OTHER)
Admission: EM | Admit: 2015-02-25 | Discharge: 2015-02-25 | Disposition: A | Discharge status: Home / Self Care | Payer: 59 | Source: Home / Self Care | Attending: Emergency Medicine | Admitting: Emergency Medicine

## 2015-02-25 DIAGNOSIS — R1084 Generalized abdominal pain: Secondary | ICD-10-CM | POA: Diagnosis not present

## 2015-02-25 DIAGNOSIS — R197 Diarrhea, unspecified: Secondary | ICD-10-CM

## 2015-02-25 MED ORDER — ONDANSETRON 8 MG PO TBDP: 8.0000 mg | ORAL_TABLET | Freq: Three times a day (TID) | ORAL | Status: DC | PRN

## 2015-02-25 NOTE — ED Notes (Signed)
Patient state she feels weak, chills having stomach pain Headache and feels nausea Symptoms started yesterday

## 2015-02-25 NOTE — ED Provider Notes (Signed)
HPI  SUBJECTIVE:  Deborah Haney is a 49 y.o. female who presents with presents with 4 episodes of watery, nonbloody diarrhea starting yesterday. She reports diffuse, crampy abdominal pain prior to having a bowel movement, and states that it resolves afterwards. She reports decreased appetite, but is tolerating liquids. She reports some body aches, generalized weakness, states that she feels bloated. Symptoms are better after having a bowel movement, no aggravating factors. She tried herbal teas, without improvement. Last dose of ibuprofen yesterday. No antipyretic in the past 4-6 hours. No rhinorrhea, coughing wheezing shortness of breath. No abdominal distention. States that she feels bloated.. No urinary urgency, frequency cloudy urine, hematuria. She reports normal urine output today. No vaginal bleeding, vaginal discharge. She reports having some leftover chicken that she had left out overnight several hours prior to the symptoms starting. She denies any raw uncooked foods, other sick contacts. Past medical history of hypertension, history of cocaine abuse, hysterectomy, cholecystectomy.. No history of diabetes, alcohol use. She does not take any medications on a regular basis.  Past Medical History  Diagnosis Date  . Anemia     has history, takes iron  . Headache(784.0)   . Diverticulitis     no problems  . GERD (gastroesophageal reflux disease)     tums prn  . Hemorrhoids     Past Surgical History  Procedure Laterality Date  . Ectopic pregnancy surgery      laparotomy  . Cholecystectomy  07/15/2013  . Abdominal hysterectomy    . Cholecystectomy N/A 07/15/2013    Procedure: LAPAROSCOPIC CHOLECYSTECTOMY;  Surgeon: Rolm Bookbinder, MD;  Location: [facility] OR;  Service: General;  Laterality: N/A;    Family History  Problem Relation Age of Onset  . Diabetes Mother   . Arthritis Father   . CVA      Social History  Substance Use Topics  . Smoking status: Former Smoker    Types:  Cigarettes    Quit date: 04/18/2008  . Smokeless tobacco: Never Used  . Alcohol Use: No    No current facility-administered medications for this encounter.  Current outpatient prescriptions:  .  fluticasone (FLONASE) 50 MCG/ACT nasal spray, Place 2 sprays into both nostrils daily., Disp: 16 g, Rfl: 2 .  ondansetron (ZOFRAN ODT) 8 MG disintegrating tablet, Take 1 tablet (8 mg total) by mouth every 8 (eight) hours as needed for nausea or vomiting., Disp: 20 tablet, Rfl: 0 .  [DISCONTINUED] Norgestimate-Ethinyl Estradiol Triphasic (ORTHO TRI-CYCLEN,TRINESSA,TRI-SPRINTEC) 0.18/0.215/0.25 MG-35 MCG tablet, Take 1 tablet by mouth daily., Disp: 1 Package, Rfl: 11  No Known Allergies   ROS  As noted in HPI.   Physical Exam  BP 142/79 mmHg  Pulse 88  Temp(Src) 98.3 F (36.8 C) (Oral)  Resp 17  SpO2 98%  LMP 04/25/2011  Constitutional: Well developed, well nourished, no acute distress Eyes: PERRL, EOMI, conjunctiva normal bilaterally HENT: Normocephalic, atraumatic,mucus membranes moist Respiratory: Clear to auscultation bilaterally, no rales, no wheezing, no rhonchi Cardiovascular: Normal rate and rhythm, no murmurs, no gallops, no rubs GI: Soft, nondistended, normal bowel sounds, mild diffuse tenderness with deep palpation, no rebound, no guarding Back: no CVAT skin: No rash, skin intact Musculoskeletal: No edema, no tenderness, no deformities Neurologic: Alert & oriented x 3, CN II-XII grossly intact, no motor deficits, sensation grossly intact Psychiatric: Speech and behavior appropriate   ED Course   Medications - No data to display  No orders of the defined types were placed in this encounter.   No results  found for this or any previous visit (from the past 24 hour(s)). No results found.  ED Clinical Impression  Diarrhea, unspecified type  Generalized abdominal pain   ED Assessment/Plan  Presentation most consistent with food poisoning/nausea/diarrhea. No  evidence of surgical abdomen at this time. Vitals are normal. She has not taken any antipyretic in the past 4-6 hours. She appears nontoxic, well-hydrated. Home with Zofran, bland diet, continue oral rehydration. Work note for 2 days. Follow Up with primary care as needed, to the ER for fevers or if gets worse.  Discussed MDM, plan and followup with patient . Discussed sn/sx that should prompt return to the UC or ED. Patient  agrees with plan.   *This clinic note was created using Dragon dictation software. Therefore, there may be occasional mistakes despite careful proofreading.  ?   Melynda Ripple, MD 02/27/15 [phone]

## 2015-07-14 ENCOUNTER — Emergency Department (HOSPITAL_COMMUNITY): Payer: BLUE CROSS/BLUE SHIELD

## 2015-07-14 ENCOUNTER — Emergency Department (HOSPITAL_COMMUNITY)
Admission: EM | Admit: 2015-07-14 | Discharge: 2015-07-14 | Disposition: A | Discharge status: Home / Self Care | Payer: BLUE CROSS/BLUE SHIELD | Attending: Emergency Medicine | Admitting: Emergency Medicine

## 2015-07-14 ENCOUNTER — Encounter (HOSPITAL_COMMUNITY): Payer: Self-pay | Admitting: Emergency Medicine

## 2015-07-14 DIAGNOSIS — S199XXA Unspecified injury of neck, initial encounter: Secondary | ICD-10-CM | POA: Insufficient documentation

## 2015-07-14 DIAGNOSIS — Y9389 Activity, other specified: Secondary | ICD-10-CM | POA: Insufficient documentation

## 2015-07-14 DIAGNOSIS — Z8719 Personal history of other diseases of the digestive system: Secondary | ICD-10-CM | POA: Diagnosis not present

## 2015-07-14 DIAGNOSIS — Y998 Other external cause status: Secondary | ICD-10-CM | POA: Diagnosis not present

## 2015-07-14 DIAGNOSIS — K449 Diaphragmatic hernia without obstruction or gangrene: Secondary | ICD-10-CM | POA: Diagnosis not present

## 2015-07-14 DIAGNOSIS — S3991XA Unspecified injury of abdomen, initial encounter: Secondary | ICD-10-CM | POA: Insufficient documentation

## 2015-07-14 DIAGNOSIS — Z862 Personal history of diseases of the blood and blood-forming organs and certain disorders involving the immune mechanism: Secondary | ICD-10-CM | POA: Diagnosis not present

## 2015-07-14 DIAGNOSIS — Y92410 Unspecified street and highway as the place of occurrence of the external cause: Secondary | ICD-10-CM | POA: Insufficient documentation

## 2015-07-14 DIAGNOSIS — S4992XA Unspecified injury of left shoulder and upper arm, initial encounter: Secondary | ICD-10-CM | POA: Insufficient documentation

## 2015-07-14 DIAGNOSIS — Z7951 Long term (current) use of inhaled steroids: Secondary | ICD-10-CM | POA: Diagnosis not present

## 2015-07-14 DIAGNOSIS — Z87891 Personal history of nicotine dependence: Secondary | ICD-10-CM | POA: Insufficient documentation

## 2015-07-14 DIAGNOSIS — R1032 Left lower quadrant pain: Secondary | ICD-10-CM

## 2015-07-14 DIAGNOSIS — K760 Fatty (change of) liver, not elsewhere classified: Secondary | ICD-10-CM | POA: Diagnosis not present

## 2015-07-14 MED ORDER — IBUPROFEN 800 MG PO TABS: 800.0000 mg | ORAL_TABLET | Freq: Three times a day (TID) | ORAL | Status: DC

## 2015-07-14 MED ORDER — DIPHENHYDRAMINE HCL 25 MG PO CAPS
25.0000 mg | ORAL_CAPSULE | Freq: Once | ORAL | Status: AC
  Administered 2015-07-14: 25 mg via ORAL
  Filled 2015-07-14: qty 1

## 2015-07-14 MED ORDER — IOPAMIDOL (ISOVUE-300) INJECTION 61%
INTRAVENOUS | Status: AC
  Administered 2015-07-14: 100 mL
  Filled 2015-07-14: qty 100

## 2015-07-14 NOTE — Discharge Instructions (Signed)

## 2015-07-14 NOTE — ED Notes (Signed)
Pt c/o sudden throat "scratchy". NP notified.
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
## 2015-10-12 ENCOUNTER — Ambulatory Visit (INDEPENDENT_AMBULATORY_CARE_PROVIDER_SITE_OTHER): Payer: BLUE CROSS/BLUE SHIELD | Admitting: Family Medicine

## 2015-10-12 ENCOUNTER — Other Ambulatory Visit: Payer: Self-pay | Admitting: Family Medicine

## 2015-10-12 ENCOUNTER — Encounter: Payer: Self-pay | Admitting: Family Medicine

## 2015-10-12 VITALS — BP 140/90 | HR 68 | Wt 200.6 lb

## 2015-10-12 DIAGNOSIS — M79602 Pain in left arm: Secondary | ICD-10-CM | POA: Diagnosis not present

## 2015-10-12 DIAGNOSIS — Z7189 Other specified counseling: Secondary | ICD-10-CM

## 2015-10-12 DIAGNOSIS — M79605 Pain in left leg: Secondary | ICD-10-CM

## 2015-10-12 DIAGNOSIS — I10 Essential (primary) hypertension: Secondary | ICD-10-CM | POA: Diagnosis not present

## 2015-10-12 DIAGNOSIS — R7309 Other abnormal glucose: Secondary | ICD-10-CM | POA: Diagnosis not present

## 2015-10-12 DIAGNOSIS — Z7689 Persons encountering health services in other specified circumstances: Secondary | ICD-10-CM

## 2015-10-12 LAB — CBC WITH DIFFERENTIAL/PLATELET
Basophils Absolute: 0 cells/uL (ref 0–200)
Basophils Relative: 0 %
Eosinophils Absolute: 219 cells/uL (ref 15–500)
Eosinophils Relative: 3 %
HCT: 36.1 % (ref 35.0–45.0)
Hemoglobin: 12.2 g/dL (ref 11.7–15.5)
Lymphocytes Relative: 41 %
Lymphs Abs: 2993 cells/uL (ref 850–3900)
MCH: 29.5 pg (ref 27.0–33.0)
MCHC: 33.8 g/dL (ref 32.0–36.0)
MCV: 87.2 fL (ref 80.0–100.0)
MPV: 10.6 fL (ref 7.5–12.5)
Monocytes Absolute: 438 cells/uL (ref 200–950)
Monocytes Relative: 6 %
Neutro Abs: 3650 cells/uL (ref 1500–7800)
Neutrophils Relative %: 50 %
Platelets: 326 10*3/uL (ref 140–400)
RBC: 4.14 MIL/uL (ref 3.80–5.10)
RDW: 15 % (ref 11.0–15.0)
WBC: 7.3 10*3/uL (ref 4.0–10.5)

## 2015-10-12 LAB — BASIC METABOLIC PANEL
BUN: 15 mg/dL (ref 7–25)
CO2: 26 mmol/L (ref 20–31)
Calcium: 9.3 mg/dL (ref 8.6–10.2)
Chloride: 100 mmol/L (ref 98–110)
Creat: 0.73 mg/dL (ref 0.50–1.10)
Glucose, Bld: 150 mg/dL — ABNORMAL HIGH (ref 65–99)
Potassium: 3.9 mmol/L (ref 3.5–5.3)
Sodium: 137 mmol/L (ref 135–146)

## 2015-10-12 NOTE — Patient Instructions (Addendum)
Take 2 aleve twice daily with food for the next 7-10 days and see if this helps with your arm pain.  Take an acid reducer like omeprazole for the next 2 weeks to help avoid GI upset.  Follow up in 2 weeks to see if pain has improved.  Keep an eye on your blood pressure and if you are getting consistent readings >140/80 then let me know.    DASH Eating Plan DASH stands for "Dietary Approaches to Stop Hypertension." The DASH eating plan is a healthy eating plan that has been shown to reduce high blood pressure (hypertension). Additional health benefits may include reducing the risk of type 2 diabetes mellitus, heart disease, and stroke. The DASH eating plan may also help with weight loss. WHAT DO I NEED TO KNOW ABOUT THE DASH EATING PLAN? For the DASH eating plan, you will follow these general guidelines:  Choose foods with a percent daily value for sodium of less than 5% (as listed on the food label).  Use salt-free seasonings or herbs instead of table salt or sea salt.  Check with your health care provider or pharmacist before using salt substitutes.  Eat lower-sodium products, often labeled as "lower sodium" or "no salt added."  Eat fresh foods.  Eat more vegetables, fruits, and low-fat dairy products.  Choose whole grains. Look for the word "whole" as the first word in the ingredient list.  Choose fish and skinless chicken or [place] more often than red meat. Limit fish, poultry, and meat to 6 oz (170 g) each day.  Limit sweets, desserts, sugars, and sugary drinks.  Choose heart-healthy fats.  Limit cheese to 1 oz (28 g) per day.  Eat more home-cooked food and less restaurant, buffet, and fast food.  Limit fried foods.  Cook foods using methods other than frying.  Limit canned vegetables. If you do use them, rinse them well to decrease the sodium.  When eating at a restaurant, ask that your food be prepared with less salt, or no salt if possible. WHAT FOODS CAN I EAT? Seek  help from a dietitian for individual calorie needs. Grains Whole grain or whole wheat bread. Brown rice. Whole grain or whole wheat pasta. Quinoa, bulgur, and whole grain cereals. Low-sodium cereals. Corn or whole wheat flour tortillas. Whole grain cornbread. Whole grain crackers. Low-sodium crackers. Vegetables Fresh or frozen vegetables (raw, steamed, roasted, or grilled). Low-sodium or reduced-sodium tomato and vegetable juices. Low-sodium or reduced-sodium tomato sauce and paste. Low-sodium or reduced-sodium canned vegetables.  Fruits All fresh, canned (in natural juice), or frozen fruits. Meat and Other Protein Products Ground beef (85% or leaner), grass-fed beef, or beef trimmed of fat. Skinless chicken or [place]. Ground chicken or [place]. Pork trimmed of fat. All fish and seafood. Eggs. Dried beans, peas, or lentils. Unsalted nuts and seeds. Unsalted canned beans. Dairy Low-fat dairy products, such as skim or 1% milk, 2% or reduced-fat cheeses, low-fat ricotta or cottage cheese, or plain low-fat yogurt. Low-sodium or reduced-sodium cheeses. Fats and Oils Tub margarines without trans fats. Light or reduced-fat mayonnaise and salad dressings (reduced sodium). Avocado. Safflower, olive, or canola oils. Natural peanut or almond butter. Other Unsalted popcorn and pretzels. The items listed above may not be a complete list of recommended foods or beverages. Contact your dietitian for more options. WHAT FOODS ARE NOT RECOMMENDED? Grains White bread. White pasta. White rice. Refined cornbread. Bagels and croissants. Crackers that contain trans fat. Vegetables Creamed or fried vegetables. Vegetables in a cheese sauce. Regular  canned vegetables. Regular canned tomato sauce and paste. Regular tomato and vegetable juices. Fruits Dried fruits. Canned fruit in light or heavy syrup. Fruit juice. Meat and Other Protein Products Fatty cuts of meat. Ribs, chicken wings, bacon, sausage, bologna, salami,  chitterlings, fatback, hot dogs, bratwurst, and packaged luncheon meats. Salted nuts and seeds. Canned beans with salt. Dairy Whole or 2% milk, cream, half-and-half, and cream cheese. Whole-fat or sweetened yogurt. Full-fat cheeses or blue cheese. Nondairy creamers and whipped toppings. Processed cheese, cheese spreads, or cheese curds. Condiments Onion and garlic salt, seasoned salt, table salt, and sea salt. Canned and packaged gravies. Worcestershire sauce. Tartar sauce. Barbecue sauce. Teriyaki sauce. Soy sauce, including reduced sodium. Steak sauce. Fish sauce. Oyster sauce. Cocktail sauce. Horseradish. Ketchup and mustard. Meat flavorings and tenderizers. Bouillon cubes. Hot sauce. Tabasco sauce. Marinades. Taco seasonings. Relishes. Fats and Oils Butter, stick margarine, lard, shortening, ghee, and bacon fat. Coconut, palm kernel, or palm oils. Regular salad dressings. Other Pickles and olives. Salted popcorn and pretzels. The items listed above may not be a complete list of foods and beverages to avoid. Contact your dietitian for more information. WHERE CAN I FIND MORE INFORMATION? National Heart, Lung, and Blood Institute: travelstabloid.com   This information is not intended to replace advice given to you by your health care provider. Make sure you discuss any questions you have with your health care provider.   Document Released: 02/10/2011 Document Revised: 03/14/2014 Document Reviewed: 12/26/2012 Elsevier Interactive Patient Education [employer].
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
## 2015-10-13 ENCOUNTER — Telehealth: Payer: Self-pay | Admitting: Family Medicine

## 2015-10-13 LAB — HEMOGLOBIN A1C
Hgb A1c MFr Bld: 6.5 % — ABNORMAL HIGH (ref <5.7)
Mean Plasma Glucose: 140 mg/dL

## 2015-10-13 NOTE — Telephone Encounter (Signed)
Rcvd office notes from [facility]

## 2015-10-13 NOTE — Telephone Encounter (Signed)
Faxed records release to [facility] fax# [phone] s/w Pam at T# [phone]

## 2015-10-26 ENCOUNTER — Ambulatory Visit (INDEPENDENT_AMBULATORY_CARE_PROVIDER_SITE_OTHER): Payer: BLUE CROSS/BLUE SHIELD | Admitting: Family Medicine

## 2015-10-26 ENCOUNTER — Encounter: Payer: Self-pay | Admitting: Family Medicine

## 2015-10-26 VITALS — BP 142/93 | HR 80 | Wt 199.6 lb

## 2015-10-26 DIAGNOSIS — N761 Subacute and chronic vaginitis: Secondary | ICD-10-CM

## 2015-10-26 DIAGNOSIS — I10 Essential (primary) hypertension: Secondary | ICD-10-CM | POA: Insufficient documentation

## 2015-10-26 DIAGNOSIS — E669 Obesity, unspecified: Secondary | ICD-10-CM

## 2015-10-26 DIAGNOSIS — R7309 Other abnormal glucose: Secondary | ICD-10-CM | POA: Diagnosis not present

## 2015-10-26 DIAGNOSIS — F319 Bipolar disorder, unspecified: Secondary | ICD-10-CM

## 2015-10-26 HISTORY — DX: Other abnormal glucose: R73.09

## 2015-10-26 HISTORY — DX: Obesity, unspecified: E66.9

## 2015-10-26 HISTORY — DX: Bipolar disorder, unspecified: F31.9

## 2015-10-26 HISTORY — DX: Subacute and chronic vaginitis: N76.1

## 2015-10-26 NOTE — Progress Notes (Signed)
Subjective:    Patient ID: Deborah Haney, female    DOB: 1965-11-19, 50 y.o.   MRN: [id number]  HPI Chief Complaint  Patient presents with  . Other    2 week follow-up.    She is here for follow-up on hypertension. We also need to discuss that her blood sugar at her last visit was elevated so we added a hemoglobin A1c and it was 6.5. she now appears to have diabetes. She states that she was told she was prediabetic approximately 6 months ago by her previous PCP.   Blood pressures at home have been consistently <140/90. Yesterday it was 128/84.  She brought her blood pressure cuff in and compared it to ours. She also brought in her blood pressure journal.  Has been eating low sodium and paying attention to the DASH diet.   Last CPE was at [facility] 11/19/2014.  Last eye exam was in 2016. She plans to schedule for a repeat exam.   Denies fever, chills, headache, dizziness, vision changes, chest pain, DOE, abdominal pain, nausea, vomiting, GI or GU symptoms.   Past Medical History:  Diagnosis Date  . Anemia    has history, takes iron  . Diverticulitis    no problems  . GERD (gastroesophageal reflux disease)    tums prn  . Headache(784.0)   . Hemorrhoids      Review of Systems Pertinent positives and negatives in the history of present illness.     Objective:   Physical Exam BP (!) 142/93   Pulse 80   Wt 199 lb 9.6 oz (90.5 kg)   LMP 04/25/2011   BMI 32.22 kg/m   Alert and oriented and in no acute distress. Not otherwise examined.     Assessment & Plan:  Essential hypertension - Plan: Amb ref to Medical Nutrition Therapy-MNT  Elevated hemoglobin A1c - Plan: Amb ref to Medical Nutrition Therapy-MNT  Obesity - Plan: Amb ref to Medical Nutrition Therapy-MNT  Discussed that her A1c is now 6.5 which means that she has diabetes. She appears to be motivated to try lifestyle modification for the next 3-4 months and I'm okay with this. She has already made some healthy diet  modifications since our last visit for better blood pressure management. Plan to refer her to the medical nutritionist for help with diet for diabetes, hypertension, and obesity. Discussed diabetes spectrum and that our goal is to prevent or delay worsening of the disease.  Recommend reducing carbohydrates in getting close to 45 g of carbs for meals and no more than 15 g for snacks. Recommend eating protein with every meal instead of just carbohydrates. We will hold off on having her check her daily blood sugars for now. Advised that diabetes increases her risk of developing further chronic illnesses such as heart disease and stroke.  She did bring her blood pressure cuff in and it appears to be relatively close to our office blood pressure cuff. She will continue to keep an eye on this at home and is aware that her pressure goal is less than 140/80. She will continue on current medication and continue lifestyle modification for blood pressure management. Continue eating a low sodium diet.  Discussed walking or whatever activity she enjoys doing in order to get 150 minutes of physical activity per week.  She will be due for physical exam and fasting labs after number 14th. She plans to return sometime in November for a CPE and fasting labs. She willl return in 3-4  months for diabetes follow-up and hypertension. She has had a dilated eye exam in the past 2 years and plans to return for another one.  Review of labs from 11/2014 fbg 111. tsh 1.41, trig 254 Echo EF 50% May 2015. Barium enema 2005 showed mild diverticulosis of right colon Other history from med records [facility]: chronic rectal bleeding, carpal tunnel right, cholescetyectomy and total hysterectomy due to fibroids 2013

## 2015-10-26 NOTE — Patient Instructions (Signed)

## 2015-11-13 ENCOUNTER — Telehealth: Payer: Self-pay | Admitting: *Deleted

## 2015-11-13 ENCOUNTER — Other Ambulatory Visit (INDEPENDENT_AMBULATORY_CARE_PROVIDER_SITE_OTHER): Payer: BLUE CROSS/BLUE SHIELD

## 2015-11-13 DIAGNOSIS — Z111 Encounter for screening for respiratory tuberculosis: Secondary | ICD-10-CM

## 2015-11-13 NOTE — Telephone Encounter (Signed)
Patient came in this afternoon for PPD. 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 She is coming back first thing Monday morning. Was not sure if she was okay to go ahead and start that without being counseled first? I told her I would check with you and see if we could do for her Monday morning, otherwise she would need to do at her CPE scheduled for Nov. Please advise, thanks.

## 2015-11-13 NOTE — Telephone Encounter (Signed)
Error

## 2015-11-16 ENCOUNTER — Other Ambulatory Visit: Payer: BLUE CROSS/BLUE SHIELD

## 2015-11-16 DIAGNOSIS — Z139 Encounter for screening, unspecified: Secondary | ICD-10-CM

## 2015-11-16 LAB — TB SKIN TEST: TB Skin Test: NEGATIVE

## 2015-11-16 NOTE — Telephone Encounter (Signed)
I'm assuming that she has been vaccinated in the past against Hep B? If so then I recommend that she draw a Hep B antibody and check her immune status. Thanks.

## 2015-11-17 ENCOUNTER — Other Ambulatory Visit (INDEPENDENT_AMBULATORY_CARE_PROVIDER_SITE_OTHER): Payer: BLUE CROSS/BLUE SHIELD

## 2015-11-17 DIAGNOSIS — Z23 Encounter for immunization: Secondary | ICD-10-CM | POA: Diagnosis not present

## 2015-11-17 LAB — HEPATITIS B SURFACE ANTIBODY,QUALITATIVE: Hep B S Ab: NEGATIVE

## 2015-11-23 ENCOUNTER — Encounter: Payer: BLUE CROSS/BLUE SHIELD | Attending: Family Medicine | Admitting: Dietician

## 2015-11-23 DIAGNOSIS — R7309 Other abnormal glucose: Secondary | ICD-10-CM

## 2015-11-23 DIAGNOSIS — I10 Essential (primary) hypertension: Secondary | ICD-10-CM | POA: Diagnosis not present

## 2015-11-23 DIAGNOSIS — Z713 Dietary counseling and surveillance: Secondary | ICD-10-CM | POA: Diagnosis not present

## 2015-11-23 DIAGNOSIS — R7303 Prediabetes: Secondary | ICD-10-CM | POA: Diagnosis not present

## 2015-11-23 NOTE — Patient Instructions (Addendum)
Eat something about every 3-5 hours you are awake.  Add protein to carbs at meals and snacks. Try adding peanut butter into oatmeal or to have with banana.  Try Egg McMuffin with Canadian bacon.  Remember to pack vegetables to have with meals/snacks. Frozen vegetables are low in sodium and don't go bad. Pack snacks (carbs and protein).  Have protein shake for meals on the go [employer]). Try to work on getting more exercise. (recommendation is 150 minutes per week).

## 2015-11-23 NOTE — Progress Notes (Signed)
  Medical Nutrition Therapy:  Appt start time: 1400 end time:  1500.   Assessment:  Primary concerns today: Deborah Haney is here today since her doctor feels like she needs help with eating better with her busy schedule. Has hypertension and was told she has borderline diabetes. Hgb A1c was 6.5% on 10/12/2015 and she is trying to try get blood sugar down without medication. Will be testing it again in November. Has been trying to follow a DASH diet but it has been hard since she works a lot.  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 Has been working over 70 hours per week for awhile. Also going to school Monday Wednesday and Friday. Does not sleep very much since she has no time.  Has gained weight in past 3 years (size 7-8 and up to size 16). Stopped using drugs about 5 years ago and wants to help people similar circumstances.   Lives by herself. She misses/skips a lot of meals but has trying not to. Would rather not eat than eat something that something is not good for her. Trying to limit carbs for past 2 months.. Stopped eating donuts in the morning a couple months ago.   Preferred Learning Style:   No preference indicated   Learning Readiness:   Ready  MEDICATIONS: see list   DIETARY INTAKE:  Usual eating pattern includes 2-3 meals and 0-2 snacks per day.  Avoided foods include: whole grain bread (will eat it)    24-hr recall:  B ( AM): yogurt, flavored (sometimes) oatmeal, banana, egg mcmuffin  Snk ( AM): none  L ( PM): none or stew, fruit, salad   Snk ( PM): none D ( PM): stew or salmon with spinach or salad  Snk ( PM): ice cream, hot dog, pastry, cookies, goldfish, pizza, chicken wings (works overnight)  Beverages: water, flavored water, sweet tea, pepsi or soda 2 x month, coffee with 1  sugar  Usual physical activity: 1 x week walking or steps and tries to do 10,000 steps per day  Estimated energy needs: 1800 calories 200 g carbohydrates 135 g protein 50 g fat  Progress Towards Goal(s):  In progress.   Nutritional Diagnosis:  NB-1.1 Food and nutrition-related knowledge deficit As related to hx of excess consumption of carbohydrates and meal skipping.  As evidenced by Hgb A1c of 6.5% and elevated blood pressure.    Intervention:  Nutrition counseling provided. Plan: Eat something about every 3-5 hours you are awake.  Add protein to carbs at meals and snacks. Try adding peanut butter into oatmeal or to have with banana.  Try Egg McMuffin with Canadian bacon.  Remember to pack vegetables to have with meals/snacks. Frozen vegetables are low in sodium and don't go bad. Pack snacks (carbs and protein).  Have protein shake for meals on the go [employer]). Try to work on getting more exercise. (recommendation is 150 minutes per week).   Teaching Method Utilized:  Visual Auditory Hands on  Handouts given during visit include:  Living Well With Diabetes  Meal Card  MyPlate Handout  15 g CHO Snacks  Barriers to learning/adherence to lifestyle change: very busy schedule  Demonstrated degree of understanding via:  Teach Back   Monitoring/Evaluation:  Dietary intake, exercise, and body weight in 2 month(s).

## 2015-12-28 ENCOUNTER — Other Ambulatory Visit (INDEPENDENT_AMBULATORY_CARE_PROVIDER_SITE_OTHER): Payer: BLUE CROSS/BLUE SHIELD

## 2015-12-28 DIAGNOSIS — Z23 Encounter for immunization: Secondary | ICD-10-CM

## 2016-01-18 ENCOUNTER — Encounter: Payer: Self-pay | Admitting: Family Medicine

## 2016-01-18 ENCOUNTER — Ambulatory Visit (INDEPENDENT_AMBULATORY_CARE_PROVIDER_SITE_OTHER): Payer: BLUE CROSS/BLUE SHIELD | Admitting: Family Medicine

## 2016-01-18 VITALS — BP 122/80 | HR 73 | Ht 66.0 in | Wt 193.4 lb

## 2016-01-18 DIAGNOSIS — Z1239 Encounter for other screening for malignant neoplasm of breast: Secondary | ICD-10-CM

## 2016-01-18 DIAGNOSIS — Z1231 Encounter for screening mammogram for malignant neoplasm of breast: Secondary | ICD-10-CM

## 2016-01-18 DIAGNOSIS — Z7251 High risk heterosexual behavior: Secondary | ICD-10-CM

## 2016-01-18 DIAGNOSIS — E119 Type 2 diabetes mellitus without complications: Secondary | ICD-10-CM

## 2016-01-18 DIAGNOSIS — I10 Essential (primary) hypertension: Secondary | ICD-10-CM | POA: Diagnosis not present

## 2016-01-18 DIAGNOSIS — Z0000 Encounter for general adult medical examination without abnormal findings: Secondary | ICD-10-CM

## 2016-01-18 DIAGNOSIS — L989 Disorder of the skin and subcutaneous tissue, unspecified: Secondary | ICD-10-CM

## 2016-01-18 DIAGNOSIS — Z23 Encounter for immunization: Secondary | ICD-10-CM | POA: Diagnosis not present

## 2016-01-18 LAB — POCT URINALYSIS DIPSTICK
Bilirubin, UA: NEGATIVE
Blood, UA: NEGATIVE
Glucose, UA: NEGATIVE
Ketones, UA: NEGATIVE
Leukocytes, UA: NEGATIVE
Nitrite, UA: NEGATIVE
Protein, UA: NEGATIVE
Spec Grav, UA: 1.025
Urobilinogen, UA: NEGATIVE
pH, UA: 6

## 2016-01-18 LAB — POCT GLYCOSYLATED HEMOGLOBIN (HGB A1C): Hemoglobin A1C: 6.6

## 2016-01-18 LAB — TSH: TSH: 1.07 mIU/L

## 2016-01-18 NOTE — Progress Notes (Signed)
Subjective:    Patient ID: Deborah Haney, female    DOB: 03/30/1965, 50 y.o.   MRN: [id number]  HPI Chief Complaint  Patient presents with  . physical    physical- follow-up DM and HTN. spot on eye and right arm   She is here for a complete physical exam and to follow up on HTN and diabetes.   3 months ago her hemoglobin A1c was 6.5.  She states that she was told she was prediabetic approximately 6 months ago by her previous PCP. She went to see the nutritionist. She is not currently checking daily blood sugars. States she has made lifestyle changes and lost 8 lbs.  Diet: stopped eating fried food. Reduced carbs and sugar.   Excerise: She is walking daily at least 10,500 steps per day.  Blood pressures at home have been consistently <140/90. Has been eating low sodium and paying attention to the DASH diet.   Other providers: OB/GYN - hysterectomy in 2014- still has ovaries and tubes. Does not plan on going back to GYN.   Social history: Lives alone, works as a Chief of Staff, Education officer, museum.   Immunizations: flu shot and tetanus.   Health maintenance:  Mammogram: 2015.  Colonoscopy: 2016 for blood in stool. States she was supposed to follow up but did not. [facility].   Last Gynecological Exam: 11/2014 Last Menstrual cycle: hysterectomy.  Reports being sexually active and unprotected sex. Would like STD testing. Reports history of STDs.  Denies history of genital herpes and states she has cyclic lesions to her genital area that she thinks is related to hair follicles.   Last Dental Exam: twice annually.  Last Eye Exam: April 2017. [facility]  Wears seatbelt always, uses sunscreen, smoke detectors in home and functioning, does not text while driving and feels safe in home environment.   Reviewed allergies, medications, past medical, surgical, family, and social history.    Review of Systems Review of Systems Constitutional: -fever, -chills, -sweats, -unexpected weight  change,-fatigue ENT: -runny nose, -ear pain, -sore throat Cardiology:  -chest pain, -palpitations, -edema Respiratory: -cough, -shortness of breath, -wheezing Gastroenterology: -abdominal pain, -nausea, -vomiting, -diarrhea, -constipation  Hematology: -bleeding or bruising problems Musculoskeletal: -arthralgias, -myalgias, -joint swelling, -back pain Ophthalmology: -vision changes Urology: -dysuria, -difficulty urinating, -hematuria, -urinary frequency, -urgency Neurology: -headache, -weakness, -tingling, -numbness       Objective:   Physical Exam BP 122/80   Pulse 73   Ht 5\' 6"  (1.676 m)   Wt 193 lb 6.4 oz (87.7 kg)   LMP 04/25/2011   BMI 31.22 kg/m   General Appearance:    Alert, cooperative, no distress, appears stated age  Head:    Normocephalic, without obvious abnormality, atraumatic  Eyes:    PERRL, conjunctiva/corneas clear, EOM's intact, fundi    benign  Ears:    Normal TM's and external ear canals  Nose:   Nares normal, mucosa normal, no drainage or sinus   tenderness  Throat:   Lips, mucosa, and tongue normal; teeth and gums normal  Neck:   Supple, no lymphadenopathy;  thyroid:  no   enlargement/tenderness/nodules; no carotid   bruit or JVD  Back:    Spine nontender, no curvature, ROM normal, no CVA     tenderness  Lungs:     Clear to auscultation bilaterally without wheezes, rales or     ronchi; respirations unlabored  Chest Wall:    No tenderness or deformity   Heart:    Regular rate and rhythm,  S1 and S2 normal, no murmur, rub   or gallop  Breast Exam:    No tenderness, masses, or nipple discharge or inversion.      No axillary lymphadenopathy  Abdomen:     Soft, non-tender, nondistended, normoactive bowel sounds,    no masses, no hepatosplenomegaly  Genitalia:    External genitalia with multiple lesions, ulcerations with minimal drainage.  BUS and vagina normal; cervix not visualized, hysterectomy.  No abnormal vaginal discharge.  Uterus not present and adnexa  not enlarged, nontender, no masses.   Rectal:    Referred back to GI.   Extremities:   No clubbing, cyanosis or edema  Pulses:   2+ and symmetric all extremities  Skin:   Skin color, texture, turgor normal, no rashes. A circular discolored lesion to her right anterior upper arm.   Lymph nodes:   Cervical, supraclavicular, and axillary nodes normal  Neurologic:   CNII-XII intact, normal strength, sensation and gait; reflexes 2+ and symmetric throughout          Psych:   Normal mood, affect, hygiene and grooming.    Urinalysis dipstick: negative A1C 6.6%       Assessment & Plan:  Routine general medical examination at a health care facility - Plan: Urinalysis Dipstick, TSH, Lipid panel  Essential hypertension  Controlled type 2 diabetes mellitus without complication, without long-term current use of insulin (HCC) - Plan: TSH, POCT glycosylated hemoglobin (Hb A1C)  Screening for breast cancer - Plan: MM DIGITAL SCREENING BILATERAL  Unprotected sex - Plan: RPR, GC/Chlamydia Probe Amp, HIV antibody, Herpes simplex virus culture  Skin lesions - Plan: Herpes simplex virus culture, Ambulatory referral to Dermatology  Need for Tdap vaccination - Plan: Tdap vaccine greater than or equal to 7yo IM  Need for prophylactic vaccination and inoculation against influenza - Plan: Flu Vaccine QUAD 36+ mos IM  Discussed that her hemoglobin A1C is 6.6% and she would like to continue with lifestyle modifications. She recently had a visit with the nutritionist and is working on [employer]. She has lost 8 lbs and plans to continue with weight loss. We will hold off for now on starting medication per patient request. Will not check blood sugars for now. Recheck in 3 months.  HTN- blood pressure is controlled, continue current medication.  She is not fasting today. She will return for fasting lipids later this week. Order in the computer.  Discussed safe sex.  Lesions to genital area are questionable for  herpes and culture will be sent. She has a skin abnormality to her RUE that has been present for years and she would like to have this checked by a dermatologist. Referral made.  tdap given. Flu shot given.  Mammogram ordered.  She was instructed to follow up with [facility].  Encouraged to have her annual diabetes eye exam.  Follow up pending labs or in 3 months for DM and HTN.

## 2016-01-18 NOTE — Patient Instructions (Addendum)
Call [facility] and schedule an appointment to follow up for colonoscopy.  Call for mammogram appointment.  Return for a lab visit to have fasting cholesterol checked.  Dr. Allyson Sabal Dermatologist [phone]  You received your Tdap and flu shots today.   Preventative Care for Adults - Female      MAINTAIN REGULAR HEALTH EXAMS:  A routine yearly physical is a good way to check in with your primary care provider about your health and preventive screening. It is also an opportunity to share updates about your health and any concerns you have, and receive a thorough all-over exam.   Most health insurance companies pay for at least some preventative services.  Check with your health plan for specific coverages.  WHAT PREVENTATIVE SERVICES DO WOMEN NEED?  Adult women should have their weight and blood pressure checked regularly.   Women age 16 and older should have their cholesterol levels checked regularly.  Women should be screened for cervical cancer with a Pap smear and pelvic exam beginning at either age 16, or 3 years after they become sexually activity.    Breast cancer screening generally begins at age 29 with a mammogram and breast exam by your primary care provider.    Beginning at age 68 and continuing to age 56, women should be screened for colorectal cancer.  Certain people may need continued testing until age 61.  Updating vaccinations is part of preventative care.  Vaccinations help protect against diseases such as the flu.  Osteoporosis is a disease in which the bones lose minerals and strength as we age. Women ages 89 and over should discuss this with their caregivers, as should women after menopause who have other risk factors.  Lab tests are generally done as part of preventative care to screen for anemia and blood disorders, to screen for problems with the kidneys and liver, to screen for bladder problems, to check blood sugar, and to check your cholesterol  level.  Preventative services generally include counseling about diet, exercise, avoiding tobacco, drugs, excessive alcohol consumption, and sexually transmitted infections.    GENERAL RECOMMENDATIONS FOR GOOD HEALTH:  Healthy diet:  Eat a variety of foods, including fruit, vegetables, animal or vegetable protein, such as meat, fish, chicken, and eggs, or beans, lentils, tofu, and grains, such as rice.  Drink plenty of water daily.  Decrease saturated fat in the diet, avoid lots of red meat, processed foods, sweets, fast foods, and fried foods.  Exercise:  Aerobic exercise helps maintain good heart health. At least 30-40 minutes of moderate-intensity exercise is recommended. For example, a brisk walk that increases your heart rate and breathing. This should be done on most days of the week.   Find a type of exercise or a variety of exercises that you enjoy so that it becomes a part of your daily life.  Examples are running, walking, swimming, water aerobics, and biking.  For motivation and support, explore group exercise such as aerobic class, spin class, Zumba, Yoga,or  martial arts, etc.    Set exercise goals for yourself, such as a certain weight goal, walk or run in a race such as a 5k walk/run.  Speak to your primary care provider about exercise goals.  Disease prevention:  If you smoke or chew tobacco, find out from your caregiver how to quit. It can literally save your life, no matter how long you have been a tobacco user. If you do not use tobacco, never begin.   Maintain a healthy diet  and normal weight. Increased weight leads to problems with blood pressure and diabetes.   The Body Mass Index or BMI is a way of measuring how much of your body is fat. Having a BMI above 27 increases the risk of heart disease, diabetes, hypertension, stroke and other problems related to obesity. Your caregiver can help determine your BMI and based on it develop an exercise and dietary program to  help you achieve or maintain this important measurement at a healthful level.  High blood pressure causes heart and blood vessel problems.  Persistent high blood pressure should be treated with medicine if weight loss and exercise do not work.   Fat and cholesterol leaves deposits in your arteries that can block them. This causes heart disease and vessel disease elsewhere in your body.  If your cholesterol is found to be high, or if you have heart disease or certain other medical conditions, then you may need to have your cholesterol monitored frequently and be treated with medication.   Ask if you should have a cardiac stress test if your history suggests this. A stress test is a test done on a treadmill that looks for heart disease. This test can find disease prior to there being a problem.  Menopause can be associated with physical symptoms and risks. Hormone replacement therapy is available to decrease these. You should talk to your caregiver about whether starting or continuing to take hormones is right for you.   Osteoporosis is a disease in which the bones lose minerals and strength as we age. This can result in serious bone fractures. Risk of osteoporosis can be identified using a bone density scan. Women ages 46 and over should discuss this with their caregivers, as should women after menopause who have other risk factors. Ask your caregiver whether you should be taking a calcium supplement and Vitamin D, to reduce the rate of osteoporosis.   Avoid drinking alcohol in excess (more than two drinks per day).  Avoid use of street drugs. Do not share needles with anyone. Ask for professional help if you need assistance or instructions on stopping the use of alcohol, cigarettes, and/or drugs.  Brush your teeth twice a day with fluoride toothpaste, and floss once a day. Good oral hygiene prevents tooth decay and gum disease. The problems can be painful, unattractive, and can cause other health  problems. Visit your dentist for a routine oral and dental check up and preventive care every 6-12 months.   Look at your skin regularly.  Use a mirror to look at your back. Notify your caregivers of changes in moles, especially if there are changes in shapes, colors, a size larger than a pencil eraser, an irregular border, or development of new moles.  Safety:  Use seatbelts 100% of the time, whether driving or as a passenger.  Use safety devices such as hearing protection if you work in environments with loud noise or significant background noise.  Use safety glasses when doing any work that could send debris in to the eyes.  Use a helmet if you ride a bike or motorcycle.  Use appropriate safety gear for contact sports.  Talk to your caregiver about gun safety.  Use sunscreen with a SPF (or skin protection factor) of 15 or greater.  Lighter skinned people are at a greater risk of skin cancer. Don't forget to also wear sunglasses in order to protect your eyes from too much damaging sunlight. Damaging sunlight can accelerate cataract formation.   Practice  safe sex. Use condoms. Condoms are used for birth control and to help reduce the spread of sexually transmitted infections (or STIs).  Some of the STIs are gonorrhea (the clap), chlamydia, syphilis, trichomonas, herpes, HPV (human papilloma virus) and HIV (human immunodeficiency virus) which causes AIDS. The herpes, HIV and HPV are viral illnesses that have no cure. These can result in disability, cancer and death.   Keep carbon monoxide and smoke detectors in your home functioning at all times. Change the batteries every 6 months or use a model that plugs into the wall.   Vaccinations:  Stay up to date with your tetanus shots and other required immunizations. You should have a booster for tetanus every 10 years. Be sure to get your flu shot every year, since 5%-20% of the [place] population comes down with the flu. The flu vaccine changes each year,  so being vaccinated once is not enough. Get your shot in the fall, before the flu season peaks.   Other vaccines to consider:  Human Papilloma Virus or HPV causes cancer of the cervix, and other infections that can be transmitted from person to person. There is a vaccine for HPV, and females should get immunized between the ages of 51 and 76. It requires a series of 3 shots.   Pneumococcal vaccine to protect against certain types of pneumonia.  This is normally recommended for adults age 10 or older.  However, adults younger than 48 years old with certain underlying conditions such as diabetes, heart or lung disease should also receive the vaccine.  Shingles vaccine to protect against Varicella Zoster if you are older than age 28, or younger than 48 years old with certain underlying illness.  Hepatitis A vaccine to protect against a form of infection of the liver by a virus acquired from food.  Hepatitis B vaccine to protect against a form of infection of the liver by a virus acquired from blood or body fluids, particularly if you work in health care.  If you plan to travel internationally, check with your local health department for specific vaccination recommendations.  Cancer Screening:  Breast cancer screening is essential to preventive care for women. All women age 92 and older should perform a breast self-exam every month. At age 37 and older, women should have their caregiver complete a breast exam each year. Women at ages 67 and older should have a mammogram (x-ray film) of the breasts. Your caregiver can discuss how often you need mammograms.    Cervical cancer screening includes taking a Pap smear (sample of cells examined under a microscope) from the cervix (end of the uterus). It also includes testing for HPV (Human Papilloma Virus, which can cause cervical cancer). Screening and a pelvic exam should begin at age 39, or 3 years after a woman becomes sexually active. Screening should  occur every year, with a Pap smear but no HPV testing, up to age 6. After age 56, you should have a Pap smear every 3 years with HPV testing, if no HPV was found previously.   Most routine colon cancer screening begins at the age of 62. On a yearly basis, doctors may provide special easy to use take-home tests to check for hidden blood in the stool. Sigmoidoscopy or colonoscopy can detect the earliest forms of colon cancer and is life saving. These tests use a small camera at the end of a tube to directly examine the colon. Speak to your caregiver about this at age 81, when routine  screening begins (and is repeated every 5 years unless early forms of pre-cancerous polyps or small growths are found).

## 2016-01-19 LAB — HIV ANTIBODY (ROUTINE TESTING W REFLEX): HIV 1&2 Ab, 4th Generation: NONREACTIVE

## 2016-01-19 LAB — GC/CHLAMYDIA PROBE AMP
CT Probe RNA: NOT DETECTED
GC Probe RNA: NOT DETECTED

## 2016-01-19 LAB — RPR

## 2016-01-20 LAB — HERPES SIMPLEX VIRUS CULTURE: Organism ID, Bacteria: NOT DETECTED

## 2016-01-25 ENCOUNTER — Encounter: Payer: BLUE CROSS/BLUE SHIELD | Attending: Family Medicine | Admitting: Dietician

## 2016-01-25 DIAGNOSIS — I10 Essential (primary) hypertension: Secondary | ICD-10-CM | POA: Diagnosis not present

## 2016-01-25 DIAGNOSIS — R7303 Prediabetes: Secondary | ICD-10-CM | POA: Diagnosis not present

## 2016-01-25 DIAGNOSIS — E669 Obesity, unspecified: Secondary | ICD-10-CM

## 2016-01-25 DIAGNOSIS — Z713 Dietary counseling and surveillance: Secondary | ICD-10-CM | POA: Insufficient documentation

## 2016-01-25 DIAGNOSIS — Z6831 Body mass index (BMI) 31.0-31.9, adult: Secondary | ICD-10-CM

## 2016-01-25 NOTE — Patient Instructions (Addendum)
Add protein (nuts, peanut better cheese, chicken, tuna/fish) to carbs (banana, grapes) at meals and snacks. Try Lennie Hummer and Fit yogurt. Remember to pack vegetables to have with meals/snacks. Frozen vegetables are low in sodium and don't go bad.  Have protein shake for meals on the go [employer]). Try to work on getting more exercise. (recommendation is 150 minutes per week).

## 2016-01-25 NOTE — Progress Notes (Signed)
  Medical Nutrition Therapy:  Appt start time: 1400 end time:  1430  Assessment:  Primary concerns today: Deborah Haney returns today with a 2 lb weight loss. Has lost more but regained some. Newest Hgb A1c was 6.6%. Was walking a lot more but struggling with the colder weather.  Schedule is the same - not sleeping much and working 3rd shift. This will be changing in May when she graduates from school and will be looking for a day job.   Feels like she has been eating more rice and peas and having some candy. Goal is to eliminate sugar. Eating a lot of banana and grapes.   Feels like she is dealing with stress better than before. Starting dating someone recently who is into healthy eating.  Preferred Learning Style:   No preference indicated   Learning Readiness:   Ready  MEDICATIONS: see list   DIETARY INTAKE:  Usual eating pattern includes 2-3 meals and 0-2 snacks per day.  Avoided foods include: whole grain bread (will eat it)    24-hr recall:  B ( AM): yogurt, flavored (sometimes) oatmeal, banana, egg mcmuffin or [place] bacon and eggs with oatmeal on Sunday  Snk ( AM): none  L ( PM): none or stew, fruit, salad   Snk ( PM): none D ( PM): stew or salmon with spinach or salad, avocado, and rice on Sunday Snk ( PM): ice cream, hot dog, pastry, candy, cookies, goldfish, pizza, chicken wings (works overnight)  Beverages: 4 bottles water, flavored water, sweet tea, coffee with 1 sugar, every other day juices vegetable with oranges and apple  Usual physical activity: 1 x week walking or steps and tries to do 10,000 steps per day  Estimated energy needs: 1800 calories 200 g carbohydrates 135 g protein 50 g fat  Progress Towards Goal(s):  In progress.   Nutritional Diagnosis:  NB-1.1 Food and nutrition-related knowledge deficit As related to hx of excess consumption of carbohydrates and meal skipping.  As evidenced by Hgb A1c of 6.5% and elevated blood pressure.     Intervention:  Nutrition counseling provided. Plan: Add protein (nuts, peanut better cheese, chicken, tuna/fish) to carbs (banana, grapes) at meals and snacks. Try Lennie Hummer and Fit yogurt. Remember to pack vegetables to have with meals/snacks. Frozen vegetables are low in sodium and don't go bad.  Have protein shake for meals on the go [employer]). Try to work on getting more exercise. (recommendation is 150 minutes per week).   Teaching Method Utilized:  Visual Auditory Hands on  Handouts given during visit include:  Meal Card  Barriers to learning/adherence to lifestyle change: very busy schedule  Demonstrated degree of understanding via:  Teach Back   Monitoring/Evaluation:  Dietary intake, exercise, and body weight in 3 month(s).

## 2016-02-01 ENCOUNTER — Telehealth: Payer: Self-pay | Admitting: Family Medicine

## 2016-02-01 NOTE — Telephone Encounter (Signed)
Patient's therapist, Charlene Brooke, called and states that the patient is there and has given consent for her to talk to me. States she thinks patient would benefit from Wellbutrin for impulsivity and depression. Discussed that I will see the patient and recommend she schedule an appointment with me to discuss this.

## 2016-02-03 ENCOUNTER — Encounter: Payer: Self-pay | Admitting: Family Medicine

## 2016-02-03 ENCOUNTER — Ambulatory Visit (INDEPENDENT_AMBULATORY_CARE_PROVIDER_SITE_OTHER): Payer: BLUE CROSS/BLUE SHIELD | Admitting: Family Medicine

## 2016-02-03 VITALS — BP 128/90 | HR 88 | Wt 194.8 lb

## 2016-02-03 DIAGNOSIS — Z8659 Personal history of other mental and behavioral disorders: Secondary | ICD-10-CM | POA: Diagnosis not present

## 2016-02-03 DIAGNOSIS — F430 Acute stress reaction: Secondary | ICD-10-CM | POA: Diagnosis not present
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
## 2016-02-03 NOTE — Patient Instructions (Signed)
You can call to schedule your appointment with the psychiatrist. A few offices are listed below for you to call.    [facility]  [address], [address], [place], [place] [address]  Phone: [phone]  [facility] [address] [place], [place] [address]  Phone: [phone]             

## 2016-02-10 ENCOUNTER — Ambulatory Visit
Admission: RE | Admit: 2016-02-10 | Discharge: 2016-02-10 | Disposition: A | Discharge status: Home / Self Care | Payer: BLUE CROSS/BLUE SHIELD | Source: Ambulatory Visit | Attending: Family Medicine | Admitting: Family Medicine

## 2016-02-10 DIAGNOSIS — Z1231 Encounter for screening mammogram for malignant neoplasm of breast: Secondary | ICD-10-CM | POA: Diagnosis not present

## 2016-02-10 DIAGNOSIS — Z1239 Encounter for other screening for malignant neoplasm of breast: Secondary | ICD-10-CM

## 2016-02-12 DIAGNOSIS — D2361 Other benign neoplasm of skin of right upper limb, including shoulder: Secondary | ICD-10-CM | POA: Diagnosis not present

## 2016-02-12 DIAGNOSIS — D485 Neoplasm of uncertain behavior of skin: Secondary | ICD-10-CM | POA: Diagnosis not present

## 2016-02-25 ENCOUNTER — Encounter: Payer: Self-pay | Admitting: Family Medicine

## 2016-03-02 ENCOUNTER — Ambulatory Visit: Payer: BLUE CROSS/BLUE SHIELD | Admitting: Family Medicine

## 2016-03-18 ENCOUNTER — Encounter: Payer: Self-pay | Admitting: Medical

## 2016-03-18 ENCOUNTER — Ambulatory Visit (INDEPENDENT_AMBULATORY_CARE_PROVIDER_SITE_OTHER): Payer: BLUE CROSS/BLUE SHIELD | Admitting: Medical

## 2016-03-18 VITALS — BP 128/80 | HR 81 | Temp 99.1°F | Wt 197.8 lb

## 2016-03-18 DIAGNOSIS — J111 Influenza due to unidentified influenza virus with other respiratory manifestations: Secondary | ICD-10-CM

## 2016-03-18 DIAGNOSIS — R6889 Other general symptoms and signs: Secondary | ICD-10-CM

## 2016-03-18 DIAGNOSIS — R040 Epistaxis: Secondary | ICD-10-CM

## 2016-03-18 LAB — POC INFLUENZA A&B (BINAX/QUICKVUE)
Influenza A, POC: NEGATIVE
Influenza B, POC: POSITIVE — AB

## 2016-03-18 NOTE — Progress Notes (Signed)
  Subjective:  Deborah Haney is a 51 y.o. female who presents for illness.   Started 3-4 days ago with body aches, chills, congestion, runny nose, cough.    Felt worse yesterday and today.  Having more body aches today, sweating.  Subjective fever.    No NVD, but stool a little loose compared to normal.   Worse body aches last 3 days.   Has had sick contacts, works in shelter, on ACT team.   Exposure to a lot of sickness.   Using alka seltzer plus, Flonase, OTC cold relief.  Nonsmoker.  Rarely gets sick.  Also has nose bleed that started this morning, can't seem to get this to calm down.  Slowly trickling.  No other aggravating or relieving factors.  No other c/o.  The following portions of the patient's history were reviewed and updated as appropriate: allergies, current medications, past medical history, past social history and problem list.  ROS as in subjective   Past Medical History:  Diagnosis Date  . Anemia    has history, takes iron  . Diabetes ([place])   . Diverticulitis    no problems  . GERD (gastroesophageal reflux disease)    tums prn  . Headache(784.0)   . Hemorrhoids   . HTN (hypertension)    BP meds since fall 2016     Objective: BP 128/80   Pulse 81   Temp 99.1 F (37.3 C)   Wt 197 lb 12.8 oz (89.7 kg)   LMP 04/25/2011   SpO2 97%   BMI 31.93 kg/m   General: somewhat Ill-appearing, well-developed, well-nourished Skin: warm, dry HEENT: Nose inflamed and congested, bright red blood in left nare laterally, clear conjunctiva, TMs pearly, no sinus tenderness, pharynx with erythema, no exudates Neck: Supple, non tender, shotty cervical adenopathy Heart: Regular rate and rhythm, normal S1, S2, no murmurs Lungs: Clear to auscultation bilaterally, no wheezes, rales, rhonchi Extremities: Mild generalized tenderness   Assessment: Encounter Diagnoses  Name Primary?  . Flu-like symptoms Yes  . Influenza   . Epistaxis      Plan: Discussed diagnosis of influenza.  Discussed supportive care including rest, hydration, OTC Tylenol or NSAID for fever, aches, and malaise.  Discussed period of contagion, self quarantine at home away from others to avoid spread of disease, discussed means of transmission, and possible complications including pneumonia.  If worse or not improving within the next 4-5 days, then call or return.  Patient voiced understanding of diagnosis, recommendations, and treatment plan.  After visit summary given.  Gave note for work.  Nosebleed - discussed findings, options for therapy.  She agrees to cautery.   Used 1% lidocaine without epi for local anesthia, achieved cautery with silver nitrated stick.  The bleed appear to be a little further back that what we can successfully cauterize.  Advised nasal tampon OTC, direct pressure.  The bleed is mild and slow, but if much worse over weekend then go to urgent care or ED if unable to get it to stop.

## 2016-03-28 ENCOUNTER — Ambulatory Visit
Admission: RE | Admit: 2016-03-28 | Discharge: 2016-03-28 | Disposition: A | Discharge status: Home / Self Care | Payer: BLUE CROSS/BLUE SHIELD | Source: Ambulatory Visit | Attending: Family Medicine | Admitting: Family Medicine

## 2016-03-28 ENCOUNTER — Encounter: Payer: Self-pay | Admitting: Family Medicine

## 2016-03-28 ENCOUNTER — Ambulatory Visit (INDEPENDENT_AMBULATORY_CARE_PROVIDER_SITE_OTHER): Payer: BLUE CROSS/BLUE SHIELD | Admitting: Family Medicine

## 2016-03-28 VITALS — BP 120/76 | HR 85 | Temp 98.5°F | Resp 16 | Wt 190.6 lb

## 2016-03-28 DIAGNOSIS — R05 Cough: Secondary | ICD-10-CM | POA: Diagnosis not present

## 2016-03-28 DIAGNOSIS — J111 Influenza due to unidentified influenza virus with other respiratory manifestations: Secondary | ICD-10-CM

## 2016-03-28 DIAGNOSIS — R059 Cough, unspecified: Secondary | ICD-10-CM

## 2016-03-28 DIAGNOSIS — J029 Acute pharyngitis, unspecified: Secondary | ICD-10-CM

## 2016-03-28 LAB — POCT RAPID STREP A (OFFICE): Rapid Strep A Screen: NEGATIVE

## 2016-03-28 MED ORDER — BENZONATATE 200 MG PO CAPS: 200.0000 mg | ORAL_CAPSULE | Freq: Two times a day (BID) | ORAL | 0 refills | Status: DC | PRN

## 2016-03-28 MED ORDER — ALBUTEROL SULFATE HFA 108 (90 BASE) MCG/ACT IN AERS: 2.0000 | INHALATION_SPRAY | Freq: Four times a day (QID) | RESPIRATORY_TRACT | 0 refills | Status: DC | PRN

## 2016-03-28 NOTE — Progress Notes (Signed)
Subjective:  Deborah Haney is a 51 y.o. female who was diagnosed with influenza B 3 days ago and presents for a 6 day history of headache, chills, body aches, cough that is occasionally productive. States she has been wheezing and has shortness of breath with activity. Also complains of sore throat that is more severe than onset of illness.  Denies fever, ear pain, chest pain, palpitations, abdominal pain, N/V/D.   Treatment to date: theraflu.  Positive sick contacts.  No other aggravating or relieving factors.  No other c/o.  ROS as in subjective.   Objective: Vitals:   03/28/16 1502  BP: 120/76  Pulse: 85  Resp: 16  Temp: 98.5 F (36.9 C)    General appearance: Alert, WD/WN, no distress, mildly ill appearing                             Skin: warm, no rash                           Head: no sinus tenderness                            Eyes: conjunctiva normal, corneas clear, PERRLA                            Ears: pearly TMs, external ear canals normal                          Nose: septum midline, turbinates swollen, with erythema and clear discharge             Mouth/throat: MMM, tongue normal, moderate pharyngeal erythema without edema.                           Neck: supple, no adenopathy, no thyromegaly, nontender                          Heart: RRR, normal S1, S2, no murmurs                         Lungs: diminished lung sounds to right middle and lower lobe, CTA otherwise, no wheezes, rales, or rhonchi      Assessment: Cough - Plan: DG Chest 2 View  Acute pharyngitis, unspecified etiology - Plan: POCT rapid strep A  Influenza - Plan: DG Chest 2 View   Plan: Rapid strep negative.  Discussed diagnosis and treatment of cough and influenza. Albuterol inhaler and Tessalon sent to pharmacy. Chest XR ordered.  Suggested symptomatic OTC remedies.Nasal saline spray for congestion.  Tylenol or Ibuprofen OTC for fever and malaise.  Plan to have her call me in the next 2 days  if she is not improving. Will follow up pending XR.

## 2016-03-28 NOTE — Patient Instructions (Signed)
We will call you with XR results. Stay well hydrated, continue treating your symptoms and call me if you are not turning the corner in the next 48 hours or if you get worse.

## 2016-03-29 ENCOUNTER — Encounter: Payer: Self-pay | Admitting: Family Medicine

## 2016-03-31 ENCOUNTER — Encounter: Payer: Self-pay | Admitting: Family Medicine

## 2016-04-25 ENCOUNTER — Ambulatory Visit (INDEPENDENT_AMBULATORY_CARE_PROVIDER_SITE_OTHER): Payer: BLUE CROSS/BLUE SHIELD | Admitting: Family Medicine

## 2016-04-25 ENCOUNTER — Encounter: Payer: Self-pay | Admitting: Family Medicine

## 2016-04-25 ENCOUNTER — Encounter: Payer: Self-pay | Admitting: Gastroenterology

## 2016-04-25 VITALS — BP 120/80 | HR 85 | Wt 194.0 lb

## 2016-04-25 DIAGNOSIS — I10 Essential (primary) hypertension: Secondary | ICD-10-CM | POA: Diagnosis not present

## 2016-04-25 DIAGNOSIS — R7309 Other abnormal glucose: Secondary | ICD-10-CM | POA: Diagnosis not present

## 2016-04-25 DIAGNOSIS — Z13220 Encounter for screening for lipoid disorders: Secondary | ICD-10-CM

## 2016-04-25 DIAGNOSIS — Z8719 Personal history of other diseases of the digestive system: Secondary | ICD-10-CM

## 2016-04-25 LAB — POCT GLYCOSYLATED HEMOGLOBIN (HGB A1C): Hemoglobin A1C: 6.4

## 2016-04-25 NOTE — Progress Notes (Signed)
   Subjective:    Patient ID: Deborah Haney, female    DOB: December 14, 1965, 51 y.o.   MRN: [id number]  HPI Chief Complaint  Patient presents with  . diabetes    diabetes    She is here to follow up diabetes and HTN. Reports good medication compliance. Does not check blood sugar or blood pressure at home.  Reports eating healthier and exercising.   States she had lower abdominal pain for a couple of days last week that resolved completely. No other concerns or complaints.  Hysterectomy in the past.   She did not return for fasting lipids. She would like to return later this week.   States she has a history of intermittent bright red blood in stool. Last episode was approximately one month ago. Reports history of diverticulitis and diverticulosis. States her stool changes color. Would like a referral to GI.   Denies fever, chills, dizziness, fatigue, chest pain, palpitations, shortness of breath, abdominal pain, N/V/D, urinary symptoms.   Reviewed allergies, medications, past medical, surgical,and social history.    Review of Systems Pertinent positives and negatives in the history of present illness.     Objective:   Physical Exam BP 120/80   Pulse 85   Wt 194 lb (88 kg)   LMP 04/25/2011   BMI 31.31 kg/m   Alert and in no distress. Pharyngeal area is normal. Neck is supple without adenopathy or thyromegaly. Cardiac exam shows a regular sinus rhythm without murmurs or gallops. Lungs are clear to auscultation.  Hemoglobin A1c 6.4%     Assessment & Plan:  Elevated hemoglobin A1c - Plan: HgB A1c, Comprehensive metabolic panel  Essential hypertension - Plan: CBC with Differential/Platelet, Comprehensive metabolic panel  History of GI bleed - Plan: Ambulatory referral to Gastroenterology  History of diverticulosis - Plan: Ambulatory referral to Gastroenterology  Screening for lipid disorders - Plan: Lipid panel  Blood pressure is within goal. Continue on current  medication.  Discussed that her hemoglobin A1c is 6.4% today and back in prediabetes range. She appears to have made healthy lifestyle changes. Congratulated her on this.  Since she states she was having insurance issues and unable to go back to [facility] will refer her to [facility] for further evaluation of intermittent blood in stool. Stool cards given.  Return for fasting labs.

## 2016-04-27 ENCOUNTER — Other Ambulatory Visit: Payer: BLUE CROSS/BLUE SHIELD

## 2016-04-27 DIAGNOSIS — E119 Type 2 diabetes mellitus without complications: Secondary | ICD-10-CM

## 2016-04-27 DIAGNOSIS — R7309 Other abnormal glucose: Secondary | ICD-10-CM

## 2016-04-27 DIAGNOSIS — I10 Essential (primary) hypertension: Secondary | ICD-10-CM

## 2016-04-27 DIAGNOSIS — Z13220 Encounter for screening for lipoid disorders: Secondary | ICD-10-CM

## 2016-04-27 DIAGNOSIS — Z7251 High risk heterosexual behavior: Secondary | ICD-10-CM

## 2016-04-27 DIAGNOSIS — Z0000 Encounter for general adult medical examination without abnormal findings: Secondary | ICD-10-CM

## 2016-04-27 LAB — COMPREHENSIVE METABOLIC PANEL
ALT: 13 U/L (ref 6–29)
AST: 11 U/L (ref 10–35)
Albumin: 3.8 g/dL (ref 3.6–5.1)
Alkaline Phosphatase: 44 U/L (ref 33–115)
BUN: 9 mg/dL (ref 7–25)
CO2: 26 mmol/L (ref 20–31)
Calcium: 8.9 mg/dL (ref 8.6–10.2)
Chloride: 103 mmol/L (ref 98–110)
Creat: 0.88 mg/dL (ref 0.50–1.10)
Glucose, Bld: 115 mg/dL — ABNORMAL HIGH (ref 65–99)
Potassium: 3.9 mmol/L (ref 3.5–5.3)
Sodium: 139 mmol/L (ref 135–146)
Total Bilirubin: 0.5 mg/dL (ref 0.2–1.2)
Total Protein: 7.7 g/dL (ref 6.1–8.1)

## 2016-04-27 LAB — CBC WITH DIFFERENTIAL/PLATELET
Basophils Absolute: 0 cells/uL (ref 0–200)
Basophils Relative: 0 %
Eosinophils Absolute: 236 cells/uL (ref 15–500)
Eosinophils Relative: 4 %
HCT: 36.3 % (ref 35.0–45.0)
Hemoglobin: 12.2 g/dL (ref 11.7–15.5)
Lymphocytes Relative: 39 %
Lymphs Abs: 2301 cells/uL (ref 850–3900)
MCH: 29 pg (ref 27.0–33.0)
MCHC: 33.6 g/dL (ref 32.0–36.0)
MCV: 86.4 fL (ref 80.0–100.0)
MPV: 10.8 fL (ref 7.5–12.5)
Monocytes Absolute: 354 cells/uL (ref 200–950)
Monocytes Relative: 6 %
Neutro Abs: 3009 cells/uL (ref 1500–7800)
Neutrophils Relative %: 51 %
Platelets: 235 10*3/uL (ref 140–400)
RBC: 4.2 MIL/uL (ref 3.80–5.10)
RDW: 15.6 % — ABNORMAL HIGH (ref 11.0–15.0)
WBC: 5.9 10*3/uL (ref 4.0–10.5)

## 2016-04-27 LAB — LIPID PANEL
Cholesterol: 172 mg/dL (ref <200)
HDL: 44 mg/dL — ABNORMAL LOW (ref >50)
LDL Cholesterol: 101 mg/dL — ABNORMAL HIGH (ref <100)
Total CHOL/HDL Ratio: 3.9 Ratio (ref <5.0)
Triglycerides: 136 mg/dL (ref <150)
VLDL: 27 mg/dL (ref <30)

## 2016-04-27 LAB — TSH: TSH: 1.33 mIU/L

## 2016-04-28 LAB — HIV ANTIBODY (ROUTINE TESTING W REFLEX): HIV 1&2 Ab, 4th Generation: NONREACTIVE

## 2016-05-02 ENCOUNTER — Ambulatory Visit: Payer: 59 | Admitting: Dietician

## 2016-05-02 ENCOUNTER — Other Ambulatory Visit (INDEPENDENT_AMBULATORY_CARE_PROVIDER_SITE_OTHER): Payer: BLUE CROSS/BLUE SHIELD

## 2016-05-02 ENCOUNTER — Encounter: Payer: BLUE CROSS/BLUE SHIELD | Attending: Family Medicine | Admitting: Registered"

## 2016-05-02 DIAGNOSIS — I10 Essential (primary) hypertension: Secondary | ICD-10-CM | POA: Diagnosis not present

## 2016-05-02 DIAGNOSIS — Z713 Dietary counseling and surveillance: Secondary | ICD-10-CM | POA: Diagnosis not present

## 2016-05-02 DIAGNOSIS — E669 Obesity, unspecified: Secondary | ICD-10-CM | POA: Diagnosis not present

## 2016-05-02 DIAGNOSIS — Z0000 Encounter for general adult medical examination without abnormal findings: Secondary | ICD-10-CM

## 2016-05-02 DIAGNOSIS — R7309 Other abnormal glucose: Secondary | ICD-10-CM | POA: Insufficient documentation

## 2016-05-02 DIAGNOSIS — E6609 Other obesity due to excess calories: Secondary | ICD-10-CM

## 2016-05-02 LAB — POC HEMOCCULT BLD/STL (HOME/3-CARD/SCREEN)
Card #2 Fecal Occult Blod, POC: NEGATIVE
Card #3 Fecal Occult Blood, POC: NEGATIVE
Fecal Occult Blood, POC: NEGATIVE

## 2016-05-02 NOTE — Progress Notes (Signed)
  Medical Nutrition Therapy:  Appt start time: 1410 end time:  1440  Assessment:  Primary concerns today: Deborah Haney continues to work on improving blood sugar and eating to manage weight Newest Hgb A1c is 6.4 (down from 6.6%.) She also reports that she wants to address her borderline lipids values to avoid going on medication.  Preferred Learning Style:   No preference indicated   Learning Readiness:   Ready  MEDICATIONS: see list   DIETARY INTAKE:  Usual eating pattern includes 2-3 meals and 0-2 snacks per day.  Avoided foods include: whole grain bread (will eat it)    24-hr recall:  B ( Am):none OR egg mcmuffin OR cracker barrel 2 bacon, 2 pancakes, 2 eggs & apples on Sundays OR gouda and bacon sandwich OR cereal cinnamon toast crunch OR yogurt activia to stay regular Snk ( AM): none OR fruit L ( PM): none OR leftovers    Snk ( PM): none OR PB&J on wheat bread D ( PM): grilled chicken, kale salad, cabbage and carrots, okra, rice and peas OR oxtails and potatoes and lima beans Snk ( PM): vanilla waffers, graham crackers, cheat snack is chips, snickers for the peanuts  Beverages: water, flavored water, soda or tea 1x month. Green tea, or herbal, Tuesdays & Thursdays coffee with 3 sugar, non-dairy flavored creamer, sometimes with splenda.   Usual physical activity: 1 x week walking or steps and tries to do 10,500 steps per day - uses fit bit to track - 3/7 days reaches goal  Estimated energy needs: 1800 calories 200 g carbohydrates 135 g protein 50 g fat  Progress Towards Goal(s):  In progress.   Nutritional Diagnosis:  NB-1.1 Food and nutrition-related knowledge deficit As related to hx of excess consumption of carbohydrates and meal skipping.  As evidenced by Hgb A1c of 6.5% and elevated blood pressure.    Intervention:  Nutrition counseling provided. Plan:  Try Lennie Hummer and Fit yogurt. To cut down on portion size, keep vegetables servings high and keep the protein and  carbs at recommended servings sizes at each meal. Remember to include protein with fruit snacks. Frozen vegetables are low in sodium and don't go bad.  Teaching Method Utilized:  Visual Auditory Hands on  Handouts given during visit include:  Snack sheet  Cholesterol sheet  Barriers to learning/adherence to lifestyle change: very busy schedule  Demonstrated degree of understanding via:  Teach Back   Monitoring/Evaluation:  Dietary intake, exercise, and body weight prn.

## 2016-05-13 DIAGNOSIS — H00024 Hordeolum internum left upper eyelid: Secondary | ICD-10-CM | POA: Diagnosis not present

## 2016-05-26 ENCOUNTER — Encounter: Payer: Self-pay | Admitting: Family Medicine

## 2016-05-27 ENCOUNTER — Other Ambulatory Visit (INDEPENDENT_AMBULATORY_CARE_PROVIDER_SITE_OTHER): Payer: BLUE CROSS/BLUE SHIELD

## 2016-05-27 DIAGNOSIS — Z23 Encounter for immunization: Secondary | ICD-10-CM | POA: Diagnosis not present

## 2016-05-30 ENCOUNTER — Encounter: Payer: Self-pay | Admitting: Gastroenterology

## 2016-05-30 ENCOUNTER — Ambulatory Visit (INDEPENDENT_AMBULATORY_CARE_PROVIDER_SITE_OTHER): Payer: BLUE CROSS/BLUE SHIELD | Admitting: Gastroenterology

## 2016-05-30 VITALS — BP 118/80 | HR 82 | Ht 66.0 in | Wt 194.0 lb

## 2016-05-30 DIAGNOSIS — K625 Hemorrhage of anus and rectum: Secondary | ICD-10-CM

## 2016-05-30 DIAGNOSIS — K649 Unspecified hemorrhoids: Secondary | ICD-10-CM

## 2016-05-30 NOTE — Progress Notes (Signed)
HPI: This is a  very pleasant 51 year old woman  who was referred to me by Harland Dingwall L, NP-C  to evaluate  intermittent rectal bleeding .    Chief complaint is intermittent rectal bleeding  Having rectal bleeding twice per month for a "long time".  Only occasionally does she have difficulty with moving her bowels.  The rectal bleeding is not only when she pushes and strains (not often).  No pains.    Takes colon cleanse tea periodically.  No FH of colon cancer.  Overall her weight is down, intentionally with improved diet.  Old Data Reviewed:  She had a colonoscopy November 2014 at [facility] done for intermittent hematochezia. They found internal hemorrhoids. The examination was otherwise normal. She was recommended to consider repeat colonoscopy at five-year interval given suboptimal prep.   Review of systems: Pertinent positive and negative review of systems were noted in the above HPI section. Complete review of systems was performed and was otherwise normal.   Past Medical History:  Diagnosis Date  . Anemia    has history, takes iron  . Diabetes ([place])   . Diverticulitis    no problems  . GERD (gastroesophageal reflux disease)    tums prn  . Headache(784.0)   . Hemorrhoids   . HTN (hypertension)    BP meds since fall 2016    Past Surgical History:  Procedure Laterality Date  . CHOLECYSTECTOMY N/A 07/15/2013   Procedure: LAPAROSCOPIC CHOLECYSTECTOMY;  Surgeon: Rolm Bookbinder, MD;  Location: [place];  Service: General;  Laterality: N/A;  . ECTOPIC PREGNANCY SURGERY     laparotomy  . ROBOTIC ASSISTED LAP VAGINAL HYSTERECTOMY      Current Outpatient Prescriptions  Medication Sig Dispense Refill  . hydrochlorothiazide (HYDRODIURIL) 25 MG tablet Take 25 mg by mouth daily.    . Multiple Vitamins-Minerals (MULTI-VITAMIN GUMMIES PO) Take 1 each by mouth. [employer] food gummies     No current facility-administered medications for this visit.      Allergies as of 05/30/2016  . (No Known Allergies)    Family History  Problem Relation Age of Onset  . Diabetes Mother   . Hypertension Mother   . Arthritis Father   . Hypertension Brother   . Cancer Brother     "in his blood"  . Diabetes Brother     diet controlled  . Cancer Brother     unsure kind  . CVA      Social History   Social History  . Marital status: Divorced    Spouse name: N/A  . Number of children: 0  . Years of education: N/A   Occupational History  . Education officer, [employer], Chief of Staff    Social History Main Topics  . Smoking status: Former Smoker    Types: Cigarettes    Quit date: 04/18/2008  . Smokeless tobacco: Never Used  . Alcohol use Yes     Comment: 1 glass of wine rarely  . Drug use: No     Comment: Former cocaine use since 2012  . Sexual activity: Not on file   Other Topics Concern  . Not on file   Social History Narrative  . No narrative on file     Physical Exam: BP 118/80   Pulse 82   Ht 5\' 6"  (1.676 m)   Wt 194 lb (88 kg)   LMP 04/25/2011   BMI 31.31 kg/m  Constitutional: generally well-appearing Psychiatric: alert and oriented x3 Eyes: extraocular movements intact Mouth:  oral pharynx moist, no lesions Neck: supple no lymphadenopathy Cardiovascular: heart regular rate and rhythm Lungs: clear to auscultation bilaterally Abdomen: soft, nontender, nondistended, no obvious ascites, no peritoneal signs, normal bowel sounds Extremities: no lower extremity edema bilaterally Skin: no lesions on visible extremities Rectal exam with female assistant in the room: small to medium sized non-thrombosed external hemorrhoids, no internal rectal masses, stool was hemocult negative.  Assessment and plan: 51 y.o. female with  Chronic intermittent rectal bleeding  I suspect she is having anal rectal, hemorrhoidal bleeding. This is been going on for many years. She was evaluated in [place] for this 3-1/2 years ago with colonoscopy  and they found internal hemorrhoids. Currently she has some external hemorrhoids on examination. I did not feel any obvious internal hemorrhoids but I suspect she still has some. Her stool is Hemoccult negative. I do not think she needs repeat colonoscopy at this point. I'm recommending she try bulking her stools with fiber supplement to see if the hemorrhoids can shrink with this change in her bowels. She will return call in about 2 months after she has been found on fiber supplements to report on her response. If she is still having difficulty with intermittent bleeding and I will arrange for her to meet one of my partners who does in office internal hemorrhoid banding. She is "due" for repeat colonoscopy in November 2019 which would be shortly after she turns 53, also her last colonoscopy 5 years prior to that had a suboptimal prep.    Please see the "Patient Instructions" section for addition details about the plan.   Owens Loffler, MD [facility] 05/30/2016, 1:36 PM  Cc: Henson, Vickie L, NP-C

## 2016-05-30 NOTE — Patient Instructions (Addendum)
Please start taking citrucel (orange flavored) powder fiber supplement.  This may cause some bloating at first but that usually goes away. Begin with a small spoonful and work your way up to a large, heaping spoonful daily over a week. Please call in 5-6 weeks to report on your response.  If you still having intermittent bleeding then I will arrange for your to meet with one of my partners to consider internal hemorrhoid banding procedure. Recall colonoscopy 01/2018 (this was recommended per [facility] colonoscopy).  If you are age 47 or older, your body mass index should be between 23-30. Your Body mass index is 31.31 kg/m. If this is out of the aforementioned range listed, please consider follow up with your Primary Care Provider.  If you are age 36 or younger, your body mass index should be between 19-25. Your Body mass index is 31.31 kg/m. If this is out of the aformentioned range listed, please consider follow up with your Primary Care Provider.

## 2016-07-01 ENCOUNTER — Encounter: Payer: Self-pay | Admitting: Family Medicine

## 2016-07-04 ENCOUNTER — Encounter: Payer: Self-pay | Admitting: Family Medicine

## 2016-07-04 MED ORDER — HYDROCHLOROTHIAZIDE 25 MG PO TABS: 25.0000 mg | ORAL_TABLET | Freq: Every day | ORAL | 2 refills | Status: DC

## 2016-07-14 ENCOUNTER — Encounter: Payer: Self-pay | Admitting: Family Medicine

## 2016-07-29 ENCOUNTER — Ambulatory Visit: Payer: BLUE CROSS/BLUE SHIELD | Admitting: Family Medicine

## 2016-08-04 ENCOUNTER — Encounter: Payer: Self-pay | Admitting: Family Medicine

## 2016-08-04 ENCOUNTER — Ambulatory Visit (INDEPENDENT_AMBULATORY_CARE_PROVIDER_SITE_OTHER): Payer: BLUE CROSS/BLUE SHIELD | Admitting: Family Medicine

## 2016-08-04 VITALS — BP 120/70 | HR 80 | Wt 195.8 lb

## 2016-08-04 DIAGNOSIS — R7309 Other abnormal glucose: Secondary | ICD-10-CM | POA: Diagnosis not present

## 2016-08-04 DIAGNOSIS — I10 Essential (primary) hypertension: Secondary | ICD-10-CM

## 2016-08-04 LAB — POCT GLYCOSYLATED HEMOGLOBIN (HGB A1C): Hemoglobin A1C: 6.3

## 2016-08-04 NOTE — Progress Notes (Signed)
   Subjective:    Patient ID: Deborah Haney, female    DOB: 1965-05-07, 51 y.o.   MRN: [id number]  HPI Chief Complaint  Patient presents with  . diabetes check    diabetes check   She is here to follow up on prediabetes and HTN. States she does not have a history of diabetes. Her previous hemoglobin A1c was 6.4%. She reportedly has been eating better and has increased her physical activity. She does not check her blood sugars at home.  No concerns regarding BP or medication. Reports good medication compliance.  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 States she is doing well.  No concerns or complaints today.   Reviewed allergies, medications, past medical, and social history.   Review of Systems Pertinent positives and negatives in the history of present illness.     Objective:   Physical Exam BP 120/70   Pulse 80   Wt 195 lb 12.8 oz (88.8 kg)   LMP 04/25/2011   BMI 31.60 kg/m   Alert and oriented and in no acute distress. Not otherwise examined.       Assessment & Plan:  Elevated hemoglobin A1c - Plan: HgB A1c  Essential hypertension  Hemoglobin A1c 6.3% she is still in prediabetes range.  BP is within goal. Continue current medication.  Continue with healthy diet and exercise and weight loss.  Will have her follow up in 6 months or sooner if needed.

## 2016-10-02 ENCOUNTER — Encounter: Payer: Self-pay | Admitting: Family Medicine

## 2016-10-03 MED ORDER — HYDROCHLOROTHIAZIDE 25 MG PO TABS: 25.0000 mg | ORAL_TABLET | Freq: Every day | ORAL | 5 refills | Status: DC

## 2016-12-26 ENCOUNTER — Other Ambulatory Visit: Payer: Self-pay | Admitting: Family Medicine

## 2016-12-26 DIAGNOSIS — Z1231 Encounter for screening mammogram for malignant neoplasm of breast: Secondary | ICD-10-CM

## 2016-12-29 DIAGNOSIS — E119 Type 2 diabetes mellitus without complications: Secondary | ICD-10-CM | POA: Diagnosis not present

## 2016-12-29 LAB — HM DIABETES EYE EXAM

## 2017-01-02 ENCOUNTER — Encounter: Payer: Self-pay | Admitting: Family Medicine

## 2017-01-02 ENCOUNTER — Ambulatory Visit (INDEPENDENT_AMBULATORY_CARE_PROVIDER_SITE_OTHER): Payer: BLUE CROSS/BLUE SHIELD | Admitting: Family Medicine

## 2017-01-02 VITALS — BP 140/70 | HR 61 | Temp 97.6°F | Wt 198.8 lb

## 2017-01-02 DIAGNOSIS — H6691 Otitis media, unspecified, right ear: Secondary | ICD-10-CM | POA: Diagnosis not present

## 2017-01-02 MED ORDER — AMOXICILLIN 875 MG PO TABS: 875.0000 mg | ORAL_TABLET | Freq: Two times a day (BID) | ORAL | 0 refills | Status: DC

## 2017-01-02 NOTE — Progress Notes (Signed)
Chief Complaint  Patient presents with  . ear pain    right ear pain, behind ear and alittle swollen. both eyes are itching.    Subjective:   Deborah Haney is a 51 y.o. female who presents with ear pain and possible ear infection. Symptoms include: right ear pain. Onset of symptoms was 4 weeks ago, and have been gradually worsening since that time. Associated symptoms include: congestion.  Patient denies: achiness, chills, fever , low grade fever, non productive cough, post nasal drip, productive cough, sinus pressure and sore throat. She is drinking moderate amounts of fluids.  Treatment to date: antihistamines and decongestants.  Denies sick contacts.  No other aggravating or relieving factors.  No other c/o.  ROS as in subjective  Objective:  Vitals:   01/02/17 1115  BP: 140/70  Pulse: 61  Temp: 97.6 F (36.4 C)  SpO2: 98%    General appearance: Alert, WD/WN, no distress, mildly ill appearing                             Skin: warm, no rash                           Head: no sinus tenderness                            Eyes: conjunctiva normal, corneas clear, PERRLA                            Ears: left TM normal, right TM dull, retracted, no discharge or perforation, external ear canals normal                          Nose: septum midline, turbinates swollen, with erythema and clear discharge             Mouth/throat: MMM, tongue normal, mild pharyngeal erythema                           Neck: supple, no adenopathy, no thyromegaly, nontender                          Heart: RRR, normal S1, S2, no murmurs                         Lungs: CTA bilaterally, no wheezes, rales, or rhonchi      Assessment: Acute otitis media, right - Plan: amoxicillin (AMOXIL) 875 MG tablet   Plan: Prescription for Amoxil sent to pharmacy.   Discussed diagnosis and treatment of otitis media.  Suggested symptomatic OTC remedies.  Tylenol or Ibuprofen OTC for fever and malaise.  Call/return if not back  to baseline after completing the antibiotic or sooner if needed.

## 2017-01-02 NOTE — Patient Instructions (Signed)
Call if you are not back to baseline after completing the antibiotic or if you get worse.

## 2017-01-13 ENCOUNTER — Other Ambulatory Visit: Payer: Self-pay | Admitting: Family Medicine

## 2017-01-13 ENCOUNTER — Encounter: Payer: Self-pay | Admitting: Family Medicine

## 2017-01-13 DIAGNOSIS — H6691 Otitis media, unspecified, right ear: Secondary | ICD-10-CM

## 2017-01-13 NOTE — Telephone Encounter (Signed)
Pt does not need this. 

## 2017-02-02 ENCOUNTER — Ambulatory Visit (INDEPENDENT_AMBULATORY_CARE_PROVIDER_SITE_OTHER): Payer: BLUE CROSS/BLUE SHIELD | Admitting: Family Medicine

## 2017-02-02 ENCOUNTER — Encounter: Payer: Self-pay | Admitting: Family Medicine

## 2017-02-02 VITALS — BP 120/70 | HR 72 | Resp 16 | Wt 200.0 lb

## 2017-02-02 DIAGNOSIS — Z23 Encounter for immunization: Secondary | ICD-10-CM | POA: Diagnosis not present

## 2017-02-02 DIAGNOSIS — I10 Essential (primary) hypertension: Secondary | ICD-10-CM | POA: Diagnosis not present

## 2017-02-02 DIAGNOSIS — R7309 Other abnormal glucose: Secondary | ICD-10-CM | POA: Diagnosis not present

## 2017-02-02 DIAGNOSIS — E119 Type 2 diabetes mellitus without complications: Secondary | ICD-10-CM

## 2017-02-02 LAB — POCT GLYCOSYLATED HEMOGLOBIN (HGB A1C): Hemoglobin A1C: 6.6

## 2017-02-02 MED ORDER — LISINOPRIL-HYDROCHLOROTHIAZIDE 10-12.5 MG PO TABS: 1.0000 | ORAL_TABLET | Freq: Every day | ORAL | 5 refills | Status: DC

## 2017-02-02 NOTE — Patient Instructions (Signed)
Your BP is in normal range. 120/70.  I am switching your BP medication from HCTZ to Lisinopril/HCTZ.   Your hemoglobin A1c is 6.6%. Cut back on sugary foods and carbohydrates.  Increase your physical activity.

## 2017-02-02 NOTE — Progress Notes (Signed)
   Subjective:    Patient ID: Deborah Haney, female    DOB: 1965/09/10, 51 y.o.   MRN: [id number]  HPI Chief Complaint  Patient presents with  . Follow-up    f/u elevated A1C.  drinking more water    She is here to follow up on diabetes type 2 and HTN. Last Hgb A1c in June 2018 6.3%.  At time of her diabetes diagnosis her A1c was 6.6%. She has controlled her diabetes with diet and exercise. No medications.   She reports an unhealthy diet for several weeks. Is not exercising.   Denies fever, chills, dizziness, chest pain, palpitations, shortness of breath, abdominal pain, N/V/D, urinary symptoms, LE edema. No increased thirst or urination.   Eye exam- done in July 2018 [facility] vision.   Reviewed allergies, medications, past medical, surgical, family, and social history.   Review of Systems Pertinent positives and negatives in the history of present illness.     Objective:   Physical Exam BP 120/70   Pulse 72   Resp 16   Wt 200 lb (90.7 kg)   LMP 04/25/2011   SpO2 97%   BMI 32.28 kg/m   Alert and oriented and in no acute distress. Foot exam done and normal.       Assessment & Plan:  Controlled type 2 diabetes mellitus without complication, without long-term current use of insulin ([place]) - Plan: CBC with Differential/Platelet, COMPLETE METABOLIC PANEL WITH GFR, Microalbumin / creatinine urine ratio, TSH, lisinopril-hydrochlorothiazide (PRINZIDE,ZESTORETIC) 10-12.5 MG tablet  Elevated hemoglobin A1c - Plan: HgB A1c  Needs flu shot - Plan: Flu Vaccine QUAD 36+ mos IM  Essential hypertension - Plan: lisinopril-hydrochlorothiazide (PRINZIDE,ZESTORETIC) 10-12.5 MG tablet  A1c 6.6% and in goal range. Diet controlled for now. She declines medication.  Declines statin.  BP is well controlled.  We will change her medication from HCTZ to lisinopril-HCTZ due to diabetes.  Flu shot given.  Urine microalbumin done.  Foot exam done and normal.  Diabetic eye exam done in July per  patient. We will get these results.  Follow up pending labs or in 6 months. She is not fasting. Will check lipids at her next visit.

## 2017-02-03 LAB — COMPLETE METABOLIC PANEL WITH GFR
AG Ratio: 1.1 (calc) (ref 1.0–2.5)
ALT: 17 U/L (ref 6–29)
AST: 15 U/L (ref 10–35)
Albumin: 4.1 g/dL (ref 3.6–5.1)
Alkaline phosphatase (APISO): 48 U/L (ref 33–115)
BUN: 14 mg/dL (ref 7–25)
CO2: 26 mmol/L (ref 20–32)
Calcium: 9.5 mg/dL (ref 8.6–10.2)
Chloride: 99 mmol/L (ref 98–110)
Creat: 0.74 mg/dL (ref 0.50–1.10)
GFR, Est African American: 110 mL/min/{1.73_m2} (ref >=60)
GFR, Est Non African American: 95 mL/min/{1.73_m2} (ref >=60)
Globulin: 3.6 g/dL (calc) (ref 1.9–3.7)
Glucose, Bld: 87 mg/dL (ref 65–99)
Potassium: 3.6 mmol/L (ref 3.5–5.3)
Sodium: 136 mmol/L (ref 135–146)
Total Bilirubin: 0.5 mg/dL (ref 0.2–1.2)
Total Protein: 7.7 g/dL (ref 6.1–8.1)

## 2017-02-03 LAB — CBC WITH DIFFERENTIAL/PLATELET
Basophils Absolute: 40 cells/uL (ref 0–200)
Basophils Relative: 0.8 %
Eosinophils Absolute: 280 cells/uL (ref 15–500)
Eosinophils Relative: 5.6 %
HCT: 35.7 % (ref 35.0–45.0)
Hemoglobin: 12.4 g/dL (ref 11.7–15.5)
Lymphs Abs: 2240 cells/uL (ref 850–3900)
MCH: 29.5 pg (ref 27.0–33.0)
MCHC: 34.7 g/dL (ref 32.0–36.0)
MCV: 85 fL (ref 80.0–100.0)
MPV: 11.7 fL (ref 7.5–12.5)
Monocytes Relative: 8.6 %
Neutro Abs: 2010 cells/uL (ref 1500–7800)
Neutrophils Relative %: 40.2 %
Platelets: 287 10*3/uL (ref 140–400)
RBC: 4.2 10*6/uL (ref 3.80–5.10)
RDW: 14 % (ref 11.0–15.0)
Total Lymphocyte: 44.8 %
WBC mixed population: 430 cells/uL (ref 200–950)
WBC: 5 10*3/uL (ref 3.8–10.8)

## 2017-02-03 LAB — MICROALBUMIN / CREATININE URINE RATIO
Creatinine, Urine: 35 mg/dL (ref 20–275)
Microalb Creat Ratio: 6 mcg/mg creat (ref <30)
Microalb, Ur: 0.2 mg/dL

## 2017-02-03 LAB — TSH: TSH: 1.37 mIU/L

## 2017-02-13 ENCOUNTER — Ambulatory Visit: Payer: BLUE CROSS/BLUE SHIELD

## 2017-02-15 ENCOUNTER — Telehealth: Payer: Self-pay | Admitting: Family Medicine

## 2017-02-15 NOTE — Telephone Encounter (Signed)
Received requested diabetic eye exam from [facility]. Sending back for review.

## 2017-02-23 ENCOUNTER — Encounter: Payer: Self-pay | Admitting: Family Medicine

## 2017-03-13 ENCOUNTER — Ambulatory Visit
Admission: RE | Admit: 2017-03-13 | Discharge: 2017-03-13 | Disposition: A | Discharge status: Home / Self Care | Payer: BLUE CROSS/BLUE SHIELD | Source: Ambulatory Visit | Attending: Family Medicine | Admitting: Family Medicine

## 2017-03-13 DIAGNOSIS — Z1231 Encounter for screening mammogram for malignant neoplasm of breast: Secondary | ICD-10-CM | POA: Diagnosis not present

## 2017-03-15 ENCOUNTER — Ambulatory Visit (INDEPENDENT_AMBULATORY_CARE_PROVIDER_SITE_OTHER): Payer: BLUE CROSS/BLUE SHIELD | Admitting: Gastroenterology

## 2017-03-15 ENCOUNTER — Encounter: Payer: Self-pay | Admitting: Gastroenterology

## 2017-03-15 VITALS — BP 116/78 | HR 72 | Ht 66.0 in | Wt 198.5 lb

## 2017-03-15 DIAGNOSIS — K648 Other hemorrhoids: Secondary | ICD-10-CM

## 2017-03-15 DIAGNOSIS — K625 Hemorrhage of anus and rectum: Secondary | ICD-10-CM

## 2017-03-15 DIAGNOSIS — K649 Unspecified hemorrhoids: Secondary | ICD-10-CM | POA: Insufficient documentation

## 2017-03-15 HISTORY — DX: Hemorrhage of anus and rectum: K62.5

## 2017-03-15 NOTE — Patient Instructions (Signed)

## 2017-03-15 NOTE — Progress Notes (Addendum)
03/15/2017 Deborah Haney [id number] 31-Aug-1965   HISTORY OF PRESENT ILLNESS: This is a pleasant 52 year old female who is known to Dr. Ardis Hughs for a visit in early 2018.  At that time she was seen for rectal bleeding.  She reported rectal bleeding intermittently for several years.  Actually had a colonoscopy in November 2014 at [facility] for the same reasons.  She was found to have internal hemorrhoids.  Repeat was recommended in 5 years due to suboptimal prep.  She is put in for colonoscopy recall with Dr. Ardis Hughs for November 2019.  Anyway, she returns here today because the bleeding has been ongoing.  Every time she has a bowel movement she passes blood.  Does not have any issues with constipation.  Since she had her gallbladder out sometimes she has loose stools, but overall no major issues.  Blood is bright red in color and occurs regularly.  She is just tired of having this issue as it has become a nuisance.   Past Medical History:  Diagnosis Date  . Anemia    has history, takes iron  . Diabetes ([place])   . Diverticulitis    no problems  . GERD (gastroesophageal reflux disease)    tums prn  . Headache(784.0)   . Hemorrhoids   . HTN (hypertension)    BP meds since fall 2016   Past Surgical History:  Procedure Laterality Date  . CHOLECYSTECTOMY N/A 07/15/2013   Procedure: LAPAROSCOPIC CHOLECYSTECTOMY;  Surgeon: Rolm Bookbinder, MD;  Location: [place];  Service: General;  Laterality: N/A;  . ECTOPIC PREGNANCY SURGERY     laparotomy  . ROBOTIC ASSISTED LAP VAGINAL HYSTERECTOMY      reports that she quit smoking about 8 years ago. Her smoking use included cigarettes. she has never used smokeless tobacco. She reports that she drinks alcohol. She reports that she does not use drugs. family history includes Arthritis in her father; CVA in her unknown relative; Cancer in her brother and brother; Diabetes in her brother and mother; Hypertension in her brother and mother. No Known  Allergies    Outpatient Encounter Medications as of 03/15/2017  Medication Sig  . lisinopril-hydrochlorothiazide (PRINZIDE,ZESTORETIC) 10-12.5 MG tablet Take 1 tablet by mouth daily.  . Multiple Vitamins-Minerals (MULTI-VITAMIN GUMMIES PO) Take 1 each by mouth. [employer] food gummies   No facility-administered encounter medications on file as of 03/15/2017.      REVIEW OF SYSTEMS  : All other systems reviewed and negative except where noted in the History of Present Illness.   PHYSICAL EXAM: BP 116/78   Pulse 72   Ht 5\' 6"  (1.676 m)   Wt 198 lb 8 oz (90 kg)   LMP 04/25/2011   BMI 32.04 kg/m  General: Well developed black female in no acute distress Head: Normocephalic and atraumatic Eyes:  Sclerae anicteric, conjunctiva pink. Ears: Normal auditory acuity Lungs: Clear throughout to auscultation; no increased WOB. Heart: Regular rate and rhythm; no M/R/G. Abdomen: Soft, non-distended.  BS present.  Non-tender. Rectal:  Small external hemorrhoids noted without irritation or bleeding.  DRE without masses.  Light blood on exam glove.  Anoscopy revealed large posterior internal hemorrhoid that was actively bleeding. Musculoskeletal: Symmetrical with no gross deformities  Skin: No lesions on visible extremities Extremities: No edema  Neurological: Alert oriented x 4, grossly non-focal Psychological:  Alert and cooperative. Normal mood and affect  ASSESSMENT AND PLAN: *Bleeding internal hemorrhoids:  Hemorrhoid banding today with Dr. Hilarie Fredrickson.    CC:  Henson, Vickie L, NP-C   Addendum: I saw the patient for symptomatic bleeding internal hemorrhoids along with Alonza Bogus, PA-C.  She has had frequent hemorrhoidal bleeding with defecation.  She has had prior colonoscopy which was unremarkable.  PROCEDURE NOTE: The patient presents with symptomatic grade 2 internal hemorrhoids, requesting rubber band ligation of her hemorrhoidal disease.  All risks, benefits and alternative  forms of therapy were described and informed consent was obtained.  In the Left Lateral Decubitus position anoscopic examination revealed grade 2 hemorrhoids in the right posterior greater than left lateral greater than right anterior position(s).  The anorectum was pre-medicated with 0.125% nitroglycerin ointment The decision was made to band the right posterior internal hemorrhoid, and the McEwen was used to perform band ligation without complication.  Digital anorectal examination was then performed to assure proper positioning of the band, and to adjust the banded tissue as required.  The patient was discharged home without pain or other issues.  Dietary and behavioral recommendations were given and along with follow-up instructions.    The patient will return in 2-6 weeks for follow-up and possible additional banding as required. No complications were encountered and the patient tolerated the procedure well.

## 2017-03-16 NOTE — Progress Notes (Signed)
I agree with the above note, plan 

## 2017-04-07 ENCOUNTER — Encounter: Payer: Self-pay | Admitting: *Deleted

## 2017-04-17 ENCOUNTER — Encounter: Payer: Self-pay | Admitting: Medical Oncology

## 2017-04-17 ENCOUNTER — Emergency Department
Admission: EM | Admit: 2017-04-17 | Discharge: 2017-04-17 | Disposition: A | Discharge status: Home / Self Care | Payer: BLUE CROSS/BLUE SHIELD | Attending: Emergency Medicine | Admitting: Emergency Medicine

## 2017-04-17 ENCOUNTER — Ambulatory Visit: Payer: BLUE CROSS/BLUE SHIELD | Admitting: Internal Medicine

## 2017-04-17 ENCOUNTER — Telehealth: Payer: Self-pay | Admitting: Family Medicine

## 2017-04-17 ENCOUNTER — Encounter: Payer: Self-pay | Admitting: Internal Medicine

## 2017-04-17 ENCOUNTER — Emergency Department: Payer: BLUE CROSS/BLUE SHIELD

## 2017-04-17 VITALS — BP 128/80 | HR 78 | Ht 66.0 in | Wt 199.0 lb

## 2017-04-17 DIAGNOSIS — R0789 Other chest pain: Secondary | ICD-10-CM | POA: Diagnosis not present

## 2017-04-17 DIAGNOSIS — I10 Essential (primary) hypertension: Secondary | ICD-10-CM | POA: Insufficient documentation

## 2017-04-17 DIAGNOSIS — Z87891 Personal history of nicotine dependence: Secondary | ICD-10-CM | POA: Insufficient documentation

## 2017-04-17 DIAGNOSIS — K648 Other hemorrhoids: Secondary | ICD-10-CM | POA: Diagnosis not present

## 2017-04-17 DIAGNOSIS — E119 Type 2 diabetes mellitus without complications: Secondary | ICD-10-CM | POA: Diagnosis not present

## 2017-04-17 DIAGNOSIS — R079 Chest pain, unspecified: Secondary | ICD-10-CM

## 2017-04-17 DIAGNOSIS — Z79899 Other long term (current) drug therapy: Secondary | ICD-10-CM | POA: Insufficient documentation

## 2017-04-17 DIAGNOSIS — R110 Nausea: Secondary | ICD-10-CM | POA: Diagnosis not present

## 2017-04-17 LAB — BASIC METABOLIC PANEL
Anion gap: 11 (ref 5–15)
BUN: 13 mg/dL (ref 6–20)
CO2: 24 mmol/L (ref 22–32)
Calcium: 9 mg/dL (ref 8.9–10.3)
Chloride: 101 mmol/L (ref 101–111)
Creatinine, Ser: 0.83 mg/dL (ref 0.44–1.00)
GFR calc Af Amer: >60 mL/min (ref >60)
GFR calc non Af Amer: >60 mL/min (ref >60)
Glucose, Bld: 117 mg/dL — ABNORMAL HIGH (ref 65–99)
Potassium: 3.6 mmol/L (ref 3.5–5.1)
Sodium: 136 mmol/L (ref 135–145)

## 2017-04-17 LAB — CBC
HCT: 36.7 % (ref 35.0–47.0)
Hemoglobin: 12.5 g/dL (ref 12.0–16.0)
MCH: 29.8 pg (ref 26.0–34.0)
MCHC: 33.9 g/dL (ref 32.0–36.0)
MCV: 87.9 fL (ref 80.0–100.0)
Platelets: 281 10*3/uL (ref 150–440)
RBC: 4.18 MIL/uL (ref 3.80–5.20)
RDW: 14.7 % — ABNORMAL HIGH (ref 11.5–14.5)
WBC: 5.6 10*3/uL (ref 3.6–11.0)

## 2017-04-17 LAB — TROPONIN I: Troponin I: <0.03 ng/mL (ref <0.03)

## 2017-04-17 NOTE — Telephone Encounter (Signed)
That is fine. Thanks 

## 2017-04-17 NOTE — Progress Notes (Signed)
Deborah Haney is a 52 year old female with seen for follow-up of hemorrhoidal bleeding after hemorrhoidal banding performed to the right anterior internal hemorrhoid on 03/15/2017 Symptoms prior to banding included bleeding with every bowel movement She reports tremendous improvement in hemorrhoidal symptoms since initial banding. Only one episode of blood with bowel movement since initial banding.  This morning she did wake with a headache and heaviness in her chest.  She was seen in the ER at [facility].  She had a normal chest x-ray, normal CBC and CMP.  EKG was normal.  She was referred to cardiology to consider stress testing.  She states that she is under stress as she works 2 jobs.  She states she has a hard time relaxing even when she is not at work.  She has been walking some and monitoring exercise on her fitbit, but even this is no longer relaxing.  Her chest pressure has resolved   PROCEDURE NOTE:  The patient presents with symptomatic grade 2 internal hemorrhoids, requesting rubber band ligation of her hemorrhoidal disease.  All risks, benefits and alternative forms of therapy were described and informed consent was obtained.   The anorectum was pre-medicated with 0.125% nitroglycerin ointment The decision was made to band the LL internal hemorrhoid, and the [name] was used to perform band ligation without complication.   Digital anorectal examination was then performed to assure proper positioning of the band, and to adjust the banded tissue as required.  The patient was discharged home without pain or other issues.  Dietary and behavioral recommendations were given and along with follow-up instructions.     The following adjunctive treatments were recommended: --Patient requested [name] cardiology referral given that she lives here rather than in [name].  Cardiology referral made for her episode of chest pressure --She asked for relaxation techniques and I  recommended that she pursue yoga which would include mindfulness exercises.  After we discussed that she is interested in pursuing a local yoga class --She will also follow-up with her primary care [name] after being seen in the ER today --Return for third banding only if symptomatic hemorrhoids recur given excellent response to this point.  If she has recurrent hemorrhoidal symptoms including bleeding she is asked to notify me and we schedule repeat banding at that time  No complications were encountered and the patient tolerated the procedure well.

## 2017-04-17 NOTE — Discharge Instructions (Signed)
You have been seen in the emergency department today for high blood pressure and chest pain. Your workup has shown normal results. As we discussed please follow-up with your primary care physician in the next 1-2 days for recheck. Return to the emergency department for any further chest pain, trouble breathing, or any other symptom personally concerning to yourself.  Please call the number provided for cardiology to arrange a follow-up appointment for further evaluation and consideration of a stress test.

## 2017-04-17 NOTE — ED Triage Notes (Signed)
Pt reports she woke up this am with headache and heaviness in her chest. Pt states she went to work and they checked her BP and it was elevated.

## 2017-04-17 NOTE — Telephone Encounter (Signed)
Pt called and states that she is feeling really bad this morning. She went to work and due to fact she was not feeling better took her bp which was 130/100. She states she currently has a headache and a "heavyness" in her chart. She states earlier she was experiencing arm pain. She wanted to know if she should come her or go to ER. Pt was advised to go to ER as some of the symptoms she is having are cause for concern.

## 2017-04-17 NOTE — ED Notes (Signed)
Pt to states that she started having chest pain this morning around 0630. Pt denies radiation of the pain. Pt was having some nausea this morning. Pt in NAD at this time. Pt is still having some chest pain at this time but the pain is not as severe at this time.

## 2017-04-17 NOTE — ED Notes (Signed)
Pt verbalizes understanding of d/c instructions and follow up. Pt reports will take medication when she gets home because she is driving.

## 2017-04-17 NOTE — Patient Instructions (Addendum)
We have scheduled you to see @ [facility] at [address], [place], [place] [address]--- 05/01/17 at 8:40 am. You should arrive at 8:20 am for registration.   HEMORRHOID BANDING PROCEDURE    FOLLOW-UP CARE   1. The procedure you have had should have been relatively painless since the banding of the area involved does not have nerve endings and there is no pain sensation.  The rubber band cuts off the blood supply to the hemorrhoid and the band may fall off as soon as 48 hours after the banding (the band may occasionally be seen in the toilet bowl following a bowel movement). You may notice a temporary feeling of fullness in the rectum which should respond adequately to plain Tylenol or Motrin.  2. Following the banding, avoid strenuous exercise that evening and resume full activity the next day.  A sitz bath (soaking in a warm tub) or bidet is soothing, and can be useful for cleansing the area after bowel movements.    3. To avoid constipation, take two tablespoons of natural wheat bran, natural oat bran, flax, Benefiber or any over the counter fiber supplement and increase your water intake to 7-8 glasses daily.    4. Unless you have been prescribed anorectal medication, do not put anything inside your rectum for two weeks: No suppositories, enemas, fingers, etc.  5. Occasionally, you may have more bleeding than usual after the banding procedure.  This is often from the untreated hemorrhoids rather than the treated one.  Don't be concerned if there is a tablespoon or so of blood.  If there is more blood than this, lie flat with your bottom higher than your head and apply an ice pack to the area. If the bleeding does not stop within a half an hour or if you feel faint, call our office at [phone] or go to the emergency room.  6. Problems are not common; however, if there is a substantial amount of bleeding, severe pain, chills, fever or difficulty passing urine (very  rare) or other problems, you should call us at [phone] or report to the nearest emergency room.  7. Do not stay seated continuously for more than 2-3 hours for a day or two after the procedure.  Tighten your buttock muscles 10-15 times every two hours and take 10-15 deep breaths every 1-2 hours.  Do not spend more than a few minutes on the toilet if you cannot empty your bowel; instead re-visit the toilet at a later time.

## 2017-04-17 NOTE — ED Provider Notes (Signed)
[facility] Emergency Department Provider Note  Time seen: 10:16 AM  I have reviewed the triage vital signs and the nursing notes.   HISTORY  Chief Complaint Hypertension and Chest Pain    HPI Deborah Haney is a 52 y.o. female with a past medical history of anemia, diabetes, gastric reflux, hypertension presents to the emergency department for concerns over blood pressure.  According to the patient she woke up around 5:00 this morning with what she describes as mild chest pressure.  Denies any diaphoresis or shortness of breath.  Does state some nausea this morning but believes it is because she is just hungry.  Denies any personal or family history of heart disease.  States she went to work and somebody checked her blood pressure and it was 130/100 and they told her she needed to go to the emergency department for evaluation.  Patient states since the blood pressure has come back down the chest pain has resolved.  She has no complaints currently besides feeling hungry and wanting to eat something.   Past Medical History:  Diagnosis Date  . Anemia    has history, takes iron  . Diabetes ([place])   . Diverticulitis    no problems  . GERD (gastroesophageal reflux disease)    tums prn  . Headache(784.0)   . Hemorrhoids   . HTN (hypertension)    BP meds since fall 2016  . Internal hemorrhoids     Patient Active Problem List   Diagnosis Date Noted  . Hemorrhoids 03/15/2017  . Rectal bleeding 03/15/2017  . HTN (hypertension) 10/26/2015  . Elevated hemoglobin A1c 10/26/2015  . Obesity 10/26/2015  . Chronic vaginitis 10/26/2015  . Bipolar affective disorder ([place]) 10/26/2015  . Nausea & vomiting 07/14/2013  . Acute cholecystitis 07/14/2013  . Chest pain 07/14/2013  . Dysfunctional uterine bleeding 03/02/2011    Past Surgical History:  Procedure Laterality Date  . CHOLECYSTECTOMY N/A 07/15/2013   Procedure: LAPAROSCOPIC CHOLECYSTECTOMY;  Surgeon: Rolm Bookbinder, MD;  Location: [facility];  Service: General;  Laterality: N/A;  . ECTOPIC PREGNANCY SURGERY     laparotomy  . ROBOTIC ASSISTED LAP VAGINAL HYSTERECTOMY      Prior to Admission medications   Medication Sig Start Date End Date Taking? Authorizing Provider  lisinopril-hydrochlorothiazide (PRINZIDE,ZESTORETIC) 10-12.5 MG tablet Take 1 tablet by mouth daily. 02/02/17   Henson, Vickie L, NP-C  Multiple Vitamins-Minerals (MULTI-VITAMIN GUMMIES PO) Take 1 each by mouth. [place] [employer] gummies    [provider]  Norgestimate-Ethinyl Estradiol Triphasic (ORTHO TRI-CYCLEN,TRINESSA,TRI-SPRINTEC) 0.18/0.215/0.25 MG-35 MCG tablet Take 1 tablet by mouth daily. 01/25/11 05/16/11  Emily Filbert, MD    No Known Allergies  Family History  Problem Relation Age of Onset  . Diabetes Mother   . Hypertension Mother   . Arthritis Father   . Hypertension Brother   . Cancer Brother        "in his blood"  . Diabetes Brother        diet controlled  . Cancer Brother        unsure kind  . CVA Unknown     Social History Social History   Tobacco Use  . Smoking status: Former Smoker    Types: Cigarettes    Last attempt to quit: 04/18/2008    Years since quitting: 9.0  . Smokeless tobacco: Never Used  Substance Use Topics  . Alcohol use: Yes    Comment: 1 glass of wine rarely  . Drug  use: No    Comment: Former cocaine use since 2012    Review of Systems Constitutional: Negative for fever. Eyes: Negative for visual complaints ENT: Negative for recent illness/congestion Cardiovascular: Chest pressure this morning around 5:00, now resolved Respiratory: Negative for shortness of breath. Gastrointestinal: Negative for abdominal pain, vomiting Genitourinary: Negative for urinary compaints Musculoskeletal: Negative for musculoskeletal complaints Skin: Negative for skin complaints  Neurological: Mild headache All other ROS  negative  ____________________________________________   PHYSICAL EXAM:  VITAL SIGNS: ED Triage Vitals [04/17/17 0922]  Enc Vitals Group     BP 137/87     Pulse Rate 64     Resp 16     Temp (!) 97.5 F (36.4 C)     Temp Source Oral     SpO2 100 %     Weight 199 lb (90.3 kg)     Height 5\' 6"  (1.676 m)     Head Circumference      Peak Flow      Pain Score 6     Pain Loc      Pain Edu?      Excl. in [place]?     Constitutional: Alert and oriented. Well appearing and in no distress. Eyes: Normal exam ENT   Head: Normocephalic and atraumatic.   Mouth/Throat: Mucous membranes are moist. Cardiovascular: Normal rate, regular rhythm. No murmur Respiratory: Normal respiratory effort without tachypnea nor retractions. Breath sounds are clear  Gastrointestinal: Soft and nontender. No distention.   Musculoskeletal: Nontender with normal range of motion in all extremities.  Neurologic:  Normal speech and language. No gross focal neurologic deficits Skin:  Skin is warm, dry and intact.  Psychiatric: Mood and affect are normal.  ____________________________________________    EKG  EKG reviewed and interpreted by myself shows a normal sinus rhythm at 63 bpm with a narrow QRS, normal axis, normal intervals besides a slight PR prolongation, nonspecific ST changes.  No ST elevation.  ____________________________________________    RADIOLOGY  [name] is normal  ____________________________________________   INITIAL IMPRESSION / ASSESSMENT AND PLAN / ED COURSE  Pertinent labs & imaging results that were available during my care of the patient were reviewed by me and considered in my medical decision making (see chart for details).  Patient presents the emergency department for concerns over elevated blood pressure as well as chest discomfort this morning.  Overall the patient appears very well, no distress, overall normal vitals mild hypertension 137/87.  Patient takes  lisinopril.  Differential would include hypertension, less likely ACS.  We will check labs including cardiac enzymes.  EKG is reassuring, chest x-ray is normal.  Patient's labs are largely within normal limits.  Chest x-ray and EKG are normal.  Overall patient appears extremely well, denies any discomfort at this time.  I did discuss follow-up with a cardiologist for a stress test I also discussed my normal chest pain return precautions.  Patient agreeable to this plan of care.    ____________________________________________   FINAL CLINICAL IMPRESSION(S) / ED DIAGNOSES  Hypertension Chest pain    Harvest Dark, MD 04/17/17 1055

## 2017-04-24 DIAGNOSIS — E119 Type 2 diabetes mellitus without complications: Secondary | ICD-10-CM | POA: Insufficient documentation

## 2017-04-28 NOTE — Progress Notes (Signed)
Cardiology Office Note:    Date:  05/01/2017   ID:  Deborah Haney, DOB [birth year], MRN [id number]  PCP:  Deborah Haney, Haney  Cardiologist:  Deborah More, MD   Referring MD: Deborah Haney, Haney  ASSESSMENT:    1. Chest pain, unspecified type   2. Essential hypertension    PLAN:    In order of problems listed above:  1. Atypical nonanginal chest pain however she is an increased cardiovascular risk with age prediabetes and hypertension.  Reviewed ischemia evaluation either imaging stress echo or CTA I recommend and she concurs and will undergo stress echo in our office.  In the interim I will start her on a nonsteroidal anti-inflammatory drug for costochondritis. 2. Stable blood pressure target continue current treatment  Next appointment one month   Medication Adjustments/Labs and Tests Ordered: Current medicines are reviewed at length with the patient today.  Concerns regarding medicines are outlined above.  Orders Placed This Encounter  Procedures  . ECHOCARDIOGRAM STRESS TEST   Meds ordered this encounter  Medications  . meloxicam (MOBIC) 15 MG tablet    Sig: Take 1 tablet (15 mg total) by mouth daily.    Dispense:  30 tablet    Refill:  1     Chief Complaint  Patient presents with  . New Patient (Initial Visit)    to evaluate chest pain and SHOB    History of Present Illness:    Deborah Haney is a 52 y.o. female with a history of anemia, diabetes, gastric reflux, hypertension  who is being seen today for the evaluation of chest pressure after [facility] visit 04/17/17 at the request of Deborah Haney. Her Troponin was <0.03 and EKG was normal.  For at least 6 months she has had intermittent episodes of substernal pressure with and without activity but not relieved with rest.  The day of the ED visit she had a prolonged episode that was severe at work and was encouraged to seek attention.  She said the symptoms waxed and waned for about a day gradually  subside it was never given nitroglycerin.  She is relatively sedentary but is not limited she finds herself breathless at times but not always related to activity.  She was not short of breath with the chest pain there is no pleuritic component cough or wheezing.  She is not having heartburn or indigestion the episodes are unrelated to meals.  Overall she describes herself as sedentary and she feels worn out due to the rigors of pursuing education while working full-time.  She has no personal history of heart disease congenital rheumatic and there is no family history of heart disease.  The day of the chest discomfort it was substernal radiating to the right and left chest did not radiate to the shoulder or the back no diaphoresis nausea or vomiting.  The day of the ED visit she described as severe in nature   Past Medical History:  Diagnosis Date  . Acute cholecystitis 07/14/2013  . Anemia    has history, takes iron  . Bipolar affective disorder ([place]) 10/26/2015   Has seen counselor, declined medications per medical record from [facility]. hypermanic  . Chest pain 07/14/2013  . Chronic vaginitis 10/26/2015   Has h/o vag dryness and tried Estrace per [facility] records  . Diabetes ([place])   . Diverticulitis    no problems  . Dysfunctional uterine bleeding 03/02/2011  . Elevated hemoglobin A1c 10/26/2015  . GERD (gastroesophageal reflux  disease)    tums prn  . Headache(784.0)   . Hemorrhoids   . HTN (hypertension)    BP meds since fall 2016  . Internal hemorrhoids   . Nausea & vomiting 07/14/2013  . Obesity 10/26/2015  . Rectal bleeding 03/15/2017    Past Surgical History:  Procedure Laterality Date  . CHOLECYSTECTOMY N/A 07/15/2013   Procedure: LAPAROSCOPIC CHOLECYSTECTOMY;  Surgeon: Deborah Bookbinder, MD;  Location: [facility];  Service: General;  Laterality: N/A;  . ECTOPIC PREGNANCY SURGERY     laparotomy  . ROBOTIC ASSISTED LAP VAGINAL HYSTERECTOMY      Current Medications: No outpatient medications  have been marked as taking for the 05/01/17 encounter (Office Visit) with Deborah Priest, MD.     Allergies:   Patient has no known allergies.   Social History   Socioeconomic History  . Marital status: Divorced    Spouse name: None  . Number of children: 0  . Years of education: None  . Highest education level: None  Social Needs  . Financial resource strain: None  . Food insecurity - worry: None  . Food insecurity - inability: None  . Transportation needs - medical: None  . Transportation needs - non-medical: None  Occupational History  . Occupation: Education officer, museum, Chief of Staff  Tobacco Use  . Smoking status: Former Smoker    Types: Cigarettes    Last attempt to quit: 04/18/2008    Years since quitting: 9.0  . Smokeless tobacco: Never Used  Substance and Sexual Activity  . Alcohol use: Yes    Comment: 1 glass of wine rarely, occasionally  . Drug use: No    Comment: Former cocaine use since 2012  . Sexual activity: None  Other Topics Concern  . None  Social History Narrative  . None     Family History: The patient's family history includes Arthritis in her father; CVA in her unknown relative; Cancer in her brother and brother; Diabetes in her brother and mother; Hypertension in her brother and mother.  ROS:   Review of Systems  Constitution: Positive for malaise/fatigue and weight gain.  HENT: Negative.   Eyes: Negative.   Cardiovascular: Positive for chest pain.  Respiratory: Positive for shortness of breath (variable pattern not limiting) and snoring.   Endocrine: Negative.   Hematologic/Lymphatic: Negative.   Skin: Negative.   Musculoskeletal: Negative.   Gastrointestinal: Negative.   Genitourinary: Negative.   Neurological: Negative.   Psychiatric/Behavioral: Negative.   Allergic/Immunologic: Negative.    Please see the history of present illness.     All other systems reviewed and are negative.  EKGs/Labs/Other Studies Reviewed:    The following  studies were reviewed today: I reviewed the emergency room note EKG labs prior to the visit  EKG 04/17/2017 sinus rhythm normal  Recent Labs: 02/02/2017: ALT 17; TSH 1.37 04/17/2017: BUN 13; Creatinine, Ser 0.83; Hemoglobin 12.5; Platelets 281; Potassium 3.6; Sodium 136  Recent Lipid Panel    Component Value Date/Time   CHOL 172 04/27/2016 0813   TRIG 136 04/27/2016 0813   HDL 44 (Haney) 04/27/2016 0813   CHOLHDL 3.9 04/27/2016 0813   VLDL 27 04/27/2016 0813   LDLCALC 101 (H) 04/27/2016 0813    Physical Exam:    VS:  BP 132/82 (BP Location: Left Arm, Patient Position: Sitting, Cuff Size: Large)   Pulse 70   Ht 5\' 6"  (1.676 m)   Wt 201 lb 1.9 oz (91.2 kg)   LMP 04/25/2011   SpO2 99%  BMI 32.46 kg/m     Wt Readings from Last 3 Encounters:  05/01/17 201 lb 1.9 oz (91.2 kg)  04/17/17 199 lb (90.3 kg)  04/17/17 199 lb (90.3 kg)     GEN:  Well nourished, well developed in no acute distress HEENT: Normal NECK: No JVD; No carotid bruits LYMPHATICS: No lymphadenopathy CARDIAC: RRR, no murmurs, rubs, gallops she has bilateral CC J tenderness reproduces much of her symptoms  RESPIRATORY:  Clear to auscultation without rales, wheezing or rhonchi  ABDOMEN: Soft, non-tender, non-distended MUSCULOSKELETAL:  No edema; No deformity  SKIN: Warm and dry NEUROLOGIC:  Alert and oriented x 3 PSYCHIATRIC:  Normal affect     Signed, Deborah More, MD  05/01/2017 9:45 AM    [place]

## 2017-05-01 ENCOUNTER — Encounter: Payer: Self-pay | Admitting: Cardiology

## 2017-05-01 ENCOUNTER — Ambulatory Visit: Payer: BLUE CROSS/BLUE SHIELD | Admitting: Cardiology

## 2017-05-01 VITALS — BP 132/82 | HR 70 | Ht 66.0 in | Wt 201.1 lb

## 2017-05-01 DIAGNOSIS — I10 Essential (primary) hypertension: Secondary | ICD-10-CM | POA: Diagnosis not present

## 2017-05-01 DIAGNOSIS — R079 Chest pain, unspecified: Secondary | ICD-10-CM | POA: Diagnosis not present

## 2017-05-01 MED ORDER — MELOXICAM 15 MG PO TABS: 15.0000 mg | ORAL_TABLET | Freq: Every day | ORAL | 1 refills | Status: DC

## 2017-05-01 NOTE — Patient Instructions (Addendum)
Medication Instructions:  Your physician has recommended you make the following change in your medication:  START meloxicam (Mobic) 15 mg daily  Labwork: None  Testing/Procedures: Your physician has requested that you have a stress echocardiogram. For further information please visit [email or web address]. Please follow instruction sheet as given.  Follow-Up: Your physician recommends that you schedule a follow-up appointment in: 4 weeks.  Any Other Special Instructions Will Be Listed Below (If Applicable).     If you need a refill on your cardiac medications before your next appointment, please call your pharmacy.    Costochondritis Costochondritis is swelling and irritation (inflammation) of the tissue (cartilage) that connects your ribs to your breastbone (sternum). This causes pain in the front of your chest. Usually, the pain:  Starts gradually.  Is in more than one rib.  This condition usually goes away on its own over time. Follow these instructions at home:  Do not do anything that makes your pain worse.  If directed, put ice on the painful area: ? Put ice in a plastic bag. ? Place a towel between your skin and the bag. ? Leave the ice on for 20 minutes, 2-3 times a day.  If directed, put heat on the affected area as often as told by your doctor. Use the heat source that your doctor tells you to use, such as a moist heat pack or a heating pad. ? Place a towel between your skin and the heat source. ? Leave the heat on for 20-30 minutes. ? Take off the heat if your skin turns bright red. This is very important if you cannot feel pain, heat, or cold. You may have a greater risk of getting burned.  Take over-the-counter and prescription medicines only as told by your doctor.  Return to your normal activities as told by your doctor. Ask your doctor what activities are safe for you.  Keep all follow-up visits as told by your doctor. This is important. Contact a doctor  if:  You have chills or a fever.  Your pain does not go away or it gets worse.  You have a cough that does not go away. Get help right away if:  You are short of breath. This information is not intended to replace advice given to you by your health care provider. Make sure you discuss any questions you have with your health care provider. Document Released: 08/10/2007 Document Revised: 09/11/2015 Document Reviewed: 06/17/2015 Elsevier Interactive Patient Education  [employer].

## 2017-05-23 ENCOUNTER — Encounter: Payer: Self-pay | Admitting: Internal Medicine

## 2017-05-26 ENCOUNTER — Ambulatory Visit (HOSPITAL_BASED_OUTPATIENT_CLINIC_OR_DEPARTMENT_OTHER)
Admission: RE | Admit: 2017-05-26 | Discharge: 2017-05-26 | Disposition: A | Discharge status: Home / Self Care | Payer: BLUE CROSS/BLUE SHIELD | Source: Ambulatory Visit | Attending: Cardiology | Admitting: Cardiology

## 2017-05-26 DIAGNOSIS — E119 Type 2 diabetes mellitus without complications: Secondary | ICD-10-CM | POA: Insufficient documentation

## 2017-05-26 DIAGNOSIS — I10 Essential (primary) hypertension: Secondary | ICD-10-CM | POA: Insufficient documentation

## 2017-05-26 DIAGNOSIS — R079 Chest pain, unspecified: Secondary | ICD-10-CM

## 2017-05-26 NOTE — Progress Notes (Signed)
Echocardiogram Echocardiogram Stress Test has been performed.  Deborah Haney 05/26/2017, 2:12 PM

## 2017-06-04 NOTE — Progress Notes (Deleted)
Cardiology Office Note:    Date:  06/05/2017   ID:  Deborah Haney, DOB 1965-10-22, MRN [id number]  PCP:  Girtha Rm, NP-C  Cardiologist:  Shirlee More, MD    Referring MD: Girtha Rm, NP-C    ASSESSMENT:    1. Essential hypertension   2. Dyslipidemia    PLAN:    In order of problems listed above:  1. Continue current treatment blood pressure target 2. With advanced lipid panel showing small particle LDL and small particle HDL he would benefit from lipid-lowering therapy and will start a low-dose of low intensity statin with follow-up lipid profile and liver function in 1 month.   Next appointment: As needed   Medication Adjustments/Labs and Tests Ordered: Current medicines are reviewed at length with the patient today.  Concerns regarding medicines are outlined above.  No orders of the defined types were placed in this encounter.  No orders of the defined types were placed in this encounter.   No chief complaint on file.   History of Present Illness:    Deborah Haney is a 52 y.o. female with a hx of  anemia, diabetes, gastric reflux, hypertension  last seen 05/01/17 for chest discomfort due to costochondritis  05/26/17: - Stress ECG conclusions: There were no stress arrhythmias or   conduction abnormalities. The stress ECG was negative for   ischemia. - Staged echo: Normal echo stress Compliance with diet, lifestyle and medications: Yes We discussed the results of his testing and advanced lipid profile.  We will initiate lipid-lowering treatment. Past Medical History:  Diagnosis Date  . Acute cholecystitis 07/14/2013  . Anemia    has history, takes iron  . Bipolar affective disorder ([place]) 10/26/2015   Has seen counselor, declined medications per medical record from [facility]. hypermanic  . Chest pain 07/14/2013  . Chronic vaginitis 10/26/2015   Has h/o vag dryness and tried Estrace per [facility] records  . Diabetes ([place])   . Diverticulitis    no problems    . Dysfunctional uterine bleeding 03/02/2011  . Elevated hemoglobin A1c 10/26/2015  . GERD (gastroesophageal reflux disease)    tums prn  . Headache(784.0)   . Hemorrhoids   . HTN (hypertension)    BP meds since fall 2016  . Internal hemorrhoids   . Nausea & vomiting 07/14/2013  . Obesity 10/26/2015  . Rectal bleeding 03/15/2017    Past Surgical History:  Procedure Laterality Date  . CHOLECYSTECTOMY N/A 07/15/2013   Procedure: LAPAROSCOPIC CHOLECYSTECTOMY;  Surgeon: Rolm Bookbinder, MD;  Location: [facility];  Service: General;  Laterality: N/A;  . ECTOPIC PREGNANCY SURGERY     laparotomy  . ROBOTIC ASSISTED LAP VAGINAL HYSTERECTOMY      Current Medications: No outpatient medications have been marked as taking for the 06/05/17 encounter (Appointment) with Richardo Priest, MD.     Allergies:   Patient has no known allergies.   Social History   Socioeconomic History  . Marital status: Divorced    Spouse name: Not on file  . Number of children: 0  . Years of education: Not on file  . Highest education level: Not on file  Occupational History  . Occupation: Education officer, museum, Chief of Staff  Social Needs  . Financial resource strain: Not on file  . Food insecurity:    Worry: Not on file    Inability: Not on file  . Transportation needs:    Medical: Not on file    Non-medical: Not on file  Tobacco  Use  . Smoking status: Former Smoker    Types: Cigarettes    Last attempt to quit: 04/18/2008    Years since quitting: 9.1  . Smokeless tobacco: Never Used  Substance and Sexual Activity  . Alcohol use: Yes    Comment: 1 glass of wine rarely, occasionally  . Drug use: No    Types: Cocaine    Comment: Former cocaine use since 2012  . Sexual activity: Not on file  Lifestyle  . Physical activity:    Days per week: Not on file    Minutes per session: Not on file  . Stress: Not on file  Relationships  . Social connections:    Talks on phone: Not on file    Gets together: Not on  file    Attends religious service: Not on file    Active member of club or organization: Not on file    Attends meetings of clubs or organizations: Not on file    Relationship status: Not on file  Other Topics Concern  . Not on file  Social History Narrative  . Not on file     Family History: The patient's family history includes Arthritis in her father; CVA in her unknown relative; Cancer in her brother and brother; Diabetes in her brother and mother; Hypertension in her brother and mother. ROS:   Please see the history of present illness.    All other systems reviewed and are negative.  EKGs/Labs/Other Studies Reviewed:    The following studies were reviewed today  Recent Labs: 02/02/2017: ALT 17; TSH 1.37 04/17/2017: BUN 13; Creatinine, Ser 0.83; Hemoglobin 12.5; Platelets 281; Potassium 3.6; Sodium 136  Recent Lipid Panel    Component Value Date/Time   CHOL 172 04/27/2016 0813   TRIG 136 04/27/2016 0813   HDL 44 (L) 04/27/2016 0813   CHOLHDL 3.9 04/27/2016 0813   VLDL 27 04/27/2016 0813   LDLCALC 101 (H) 04/27/2016 0813    Physical Exam:    VS:  LMP 04/25/2011     Wt Readings from Last 3 Encounters:  05/01/17 201 lb 1.9 oz (91.2 kg)  04/17/17 199 lb (90.3 kg)  04/17/17 199 lb (90.3 kg)     GEN:  Well nourished, well developed in no acute distress HEENT: Normal NECK: No JVD; No carotid bruits LYMPHATICS: No lymphadenopathy CARDIAC: RRR, no murmurs, rubs, gallops RESPIRATORY:  Clear to auscultation without rales, wheezing or rhonchi  ABDOMEN: Soft, non-tender, non-distended MUSCULOSKELETAL:  No edema; No deformity  SKIN: Warm and dry NEUROLOGIC:  Alert and oriented x 3 PSYCHIATRIC:  Normal affect    Signed, Shirlee More, MD  06/05/2017 4:19 PM    [facility]

## 2017-06-05 ENCOUNTER — Ambulatory Visit (HOSPITAL_BASED_OUTPATIENT_CLINIC_OR_DEPARTMENT_OTHER): Payer: BLUE CROSS/BLUE SHIELD

## 2017-06-05 ENCOUNTER — Ambulatory Visit: Payer: BLUE CROSS/BLUE SHIELD | Admitting: Cardiology

## 2017-06-05 DIAGNOSIS — E785 Hyperlipidemia, unspecified: Secondary | ICD-10-CM | POA: Insufficient documentation

## 2017-06-05 DIAGNOSIS — R0989 Other specified symptoms and signs involving the circulatory and respiratory systems: Secondary | ICD-10-CM

## 2017-06-06 DIAGNOSIS — H1045 Other chronic allergic conjunctivitis: Secondary | ICD-10-CM | POA: Diagnosis not present

## 2017-08-08 ENCOUNTER — Encounter: Payer: Self-pay | Admitting: Family Medicine

## 2017-08-08 ENCOUNTER — Other Ambulatory Visit: Payer: Self-pay | Admitting: Family Medicine

## 2017-08-08 DIAGNOSIS — I10 Essential (primary) hypertension: Secondary | ICD-10-CM

## 2017-08-08 DIAGNOSIS — E119 Type 2 diabetes mellitus without complications: Secondary | ICD-10-CM

## 2017-08-08 MED ORDER — LISINOPRIL-HYDROCHLOROTHIAZIDE 10-12.5 MG PO TABS: 1.0000 | ORAL_TABLET | Freq: Every day | ORAL | 0 refills | Status: DC

## 2017-08-10 ENCOUNTER — Encounter: Payer: Self-pay | Admitting: Family Medicine

## 2017-08-10 ENCOUNTER — Ambulatory Visit (INDEPENDENT_AMBULATORY_CARE_PROVIDER_SITE_OTHER): Payer: BLUE CROSS/BLUE SHIELD | Admitting: Family Medicine

## 2017-08-10 VITALS — BP 120/70 | HR 70 | Ht 66.25 in | Wt 199.6 lb

## 2017-08-10 DIAGNOSIS — F319 Bipolar disorder, unspecified: Secondary | ICD-10-CM | POA: Diagnosis not present

## 2017-08-10 DIAGNOSIS — E669 Obesity, unspecified: Secondary | ICD-10-CM

## 2017-08-10 DIAGNOSIS — Z6831 Body mass index (BMI) 31.0-31.9, adult: Secondary | ICD-10-CM

## 2017-08-10 DIAGNOSIS — I10 Essential (primary) hypertension: Secondary | ICD-10-CM

## 2017-08-10 DIAGNOSIS — E119 Type 2 diabetes mellitus without complications: Secondary | ICD-10-CM

## 2017-08-10 DIAGNOSIS — E785 Hyperlipidemia, unspecified: Secondary | ICD-10-CM | POA: Diagnosis not present

## 2017-08-10 DIAGNOSIS — Z0000 Encounter for general adult medical examination without abnormal findings: Secondary | ICD-10-CM | POA: Diagnosis not present

## 2017-08-10 DIAGNOSIS — R21 Rash and other nonspecific skin eruption: Secondary | ICD-10-CM

## 2017-08-10 DIAGNOSIS — Z113 Encounter for screening for infections with a predominantly sexual mode of transmission: Secondary | ICD-10-CM | POA: Diagnosis not present

## 2017-08-10 LAB — POCT GLYCOSYLATED HEMOGLOBIN (HGB A1C): Hemoglobin A1C: 6.3 % — AB (ref 4.0–5.6)

## 2017-08-10 LAB — POCT URINALYSIS DIP (PROADVANTAGE DEVICE)
Bilirubin, UA: NEGATIVE
Blood, UA: NEGATIVE
Glucose, UA: NEGATIVE mg/dL
Ketones, POC UA: NEGATIVE mg/dL
Leukocytes, UA: NEGATIVE
Nitrite, UA: NEGATIVE
Protein Ur, POC: NEGATIVE mg/dL
Specific Gravity, Urine: 1.02
Urobilinogen, Ur: NEGATIVE
pH, UA: 6 (ref 5.0–8.0)

## 2017-08-10 NOTE — Progress Notes (Deleted)
Subjective:    Patient ID: Deborah Haney, female    DOB: [birth year], 52 y.o.   MRN: [id number]  HPI Chief Complaint  Patient presents with  . physical    physical. and needs a1c checked. eye exam done within a year   She is here for a complete physical exam and to follow up on chronic health conditions.  Last CPE: 01/2016  HTN-  Diabetes- Hgb A1c 6.6% in 01/2017  Other providers:  Dr. Hilarie Fredrickson- GI Dr. Munley-cardiologist  Past medical history: Surgeries:  Family history: Mental Health History:  Social history: Lives with ***, works as ***, *** Smoking, drinking alcohol, drug use  Diet: *** Excerise: ***  Immunizations:  Health maintenance:  Mammogram: 03/2017 and normal Colonoscopy: Last Gynecological Exam: hysterectomy in 2014?  pap smear in 11/2014. Normal and neg for HPV  Last Menstrual cycle: Pregnancies:  Last Dental Exam: Last Eye Exam:  Wears seatbelt always, uses sunscreen, smoke detectors in home and functioning, does not text while driving and feels safe in home environment.   Reviewed allergies, medications, past medical, surgical, family, and social history.    Review of Systems Review of Systems Constitutional: -fever, -chills, -sweats, -unexpected weight change,-fatigue ENT: -runny nose, -ear pain, -sore throat Cardiology:  -chest pain, -palpitations, -edema Respiratory: -cough, -shortness of breath, -wheezing Gastroenterology: -abdominal pain, -nausea, -vomiting, -diarrhea, -constipation  Hematology: -bleeding or bruising problems Musculoskeletal: -arthralgias, -myalgias, -joint swelling, -back pain Ophthalmology: -vision changes Urology: -dysuria, -difficulty urinating, -hematuria, -urinary frequency, -urgency Neurology: -headache, -weakness, -tingling, -numbness       Objective:   Physical Exam BP 120/70   Pulse 70   LMP 04/25/2011   General Appearance:    Alert, cooperative, no distress, appears stated age  Head:     Normocephalic, without obvious abnormality, atraumatic  Eyes:    PERRL, conjunctiva/corneas clear, EOM's intact, fundi    benign  Ears:    Normal TM's and external ear canals  Nose:   Nares normal, mucosa normal, no drainage or sinus   tenderness  Throat:   Lips, mucosa, and tongue normal; teeth and gums normal  Neck:   Supple, no lymphadenopathy;  thyroid:  no   enlargement/tenderness/nodules; no carotid   bruit or JVD  Back:    Spine nontender, no curvature, ROM normal, no CVA     tenderness  Lungs:     Clear to auscultation bilaterally without wheezes, rales or     ronchi; respirations unlabored  Chest Wall:    No tenderness or deformity   Heart:    Regular rate and rhythm, S1 and S2 normal, no murmur, rub   or gallop  Breast Exam:    No tenderness, masses, or nipple discharge or inversion.      No axillary lymphadenopathy  Abdomen:     Soft, non-tender, nondistended, normoactive bowel sounds,    no masses, no hepatosplenomegaly  Genitalia:    Normal external genitalia without lesions.  BUS and vagina normal; cervix without lesions, or cervical motion tenderness. No abnormal vaginal discharge.  Uterus and adnexa not enlarged, nontender, no masses.  Pap performed  Rectal:    Normal tone, no masses or tenderness; guaiac negative stool  Extremities:   No clubbing, cyanosis or edema  Pulses:   2+ and symmetric all extremities  Skin:   Skin color, texture, turgor normal, no rashes or lesions  Lymph nodes:   Cervical, supraclavicular, and axillary nodes normal  Neurologic:   CNII-XII intact, normal strength, sensation and gait;  reflexes 2+ and symmetric throughout          Psych:   Normal mood, affect, hygiene and grooming.    Urinalysis dipstick:        Assessment & Plan:  Routine general medical examination at a health care facility - Plan: POCT Urinalysis DIP (Proadvantage Device)  Essential hypertension  Type 2 diabetes mellitus without complication, without long-term current use  of insulin (HCC) - Plan: HgB A1c  Class 1 obesity without serious comorbidity with body mass index (BMI) of 31.0 to 31.9 in adult, unspecified obesity type  Bipolar affective disorder, remission status unspecified ([place])  Dyslipidemia

## 2017-08-10 NOTE — Progress Notes (Signed)
Subjective:  Patient ID: Deborah Haney, female DOB: Nov 29, 1965, 52 y.o. MRN: [id number]  HPI      Chief Complaint  Patient presents with  . physical    physical. and needs a1c checked. eye exam done within a year   She is here for a complete physical exam and to follow up on chronic health conditions.  Last CPE: 01/2016  HTN- does not check BP at home. Reports good compliance.  Diabetes- Hgb A1c 6.6% in 01/2017  Other providers:  Dr. Hilarie Fredrickson- GI  Dr. Bettina Gavia- cardiologist    States her face has been breaking out with a pruritic rash for the past 2 months but worse over the past 2 weeks. States she is using a mild soap and unscented lotion but not helping.    Social history: Lives alone, works as Tourist information centre manager at [employer]  Denies smoking, drinking alcohol, drug use  Diet: fairly healthy  Excerise: some days Immunizations:  Health maintenance:  Mammogram: 03/2017 and normal  Colonoscopy: due and has appt Last Gynecological Exam: hysterectomy in 2013-2014. pap smear in 11/2014. Normal and neg for HPV  Last Dental Exam: overdue  Last Eye Exam: up to date   Wears seatbelt always, uses sunscreen, smoke detectors in home and functioning, does not text while driving and feels safe in home environment.   Reviewed allergies, medications, past medical, surgical, family, and social history.   Review of Systems   Constitutional: -fever, -chills, -sweats, -unexpected weight change,-fatigue  ENT: -runny nose, -ear pain, -sore throat  Cardiology: -chest pain, -palpitations, -edema  Respiratory: -cough, -shortness of breath, -wheezing  Gastroenterology: -abdominal pain, -nausea, -vomiting, -diarrhea, -constipation  Hematology: -bleeding or bruising problems  Musculoskeletal: -arthralgias, -myalgias, -joint swelling, -back pain  Ophthalmology: -vision changes  Urology: -dysuria, -difficulty urinating, -hematuria, -urinary frequency, -urgency  Neurology: -headache, -weakness,  -tingling, -numbness   Objective:  Physical Exam  BP 120/70  Pulse 70  LMP 04/25/2011  General Appearance:  Alert, cooperative, no distress, appears stated age  Head:  Normocephalic, without obvious abnormality, atraumatic  Eyes:  PERRL, conjunctiva/corneas clear, EOM's intact, fundi  benign  Ears:  Normal TM's and external ear canals  Nose: Nares normal, mucosa normal, no drainage or sinus tenderness  Throat: Lips, mucosa, and tongue normal; teeth and gums normal  Neck: Supple, no lymphadenopathy; thyroid: no enlargement/tenderness/nodules; no carotid  bruit or JVD  Back:  Spine nontender, no curvature, ROM normal, no CVA tenderness  Lungs:  Clear to auscultation bilaterally without wheezes, rales or ronchi; respirations unlabored  Chest Wall:  No tenderness or deformity  Heart:  Regular rate and rhythm, S1 and S2 normal, no murmur, rub  or gallop  Breast Exam:  No tenderness, masses, or nipple discharge or inversion. No axillary lymphadenopathy  Abdomen:  Soft, non-tender, nondistended, normoactive bowel sounds,  no masses, no hepatosplenomegaly  Genitalia:  Normal external genitalia without lesions. BUS and vagina normal;  No abnormal vaginal discharge. adnexa not enlarged, nontender, no masses. Pap not performed-hysterectomy      Extremities: No clubbing, cyanosis or edema  Pulses: 2+ and symmetric all extremities  Skin: Skin color, texture, turgor normal, no rashes or lesions. Dry scaling rash on bilateral cheeks, pruritic. No sign of infection.   Lymph nodes: Cervical, supraclavicular, and axillary nodes normal  Neurologic: CNII-XII intact, normal strength, sensation and gait; reflexes 2+ and symmetric throughout   Psych: Normal mood, affect, hygiene and grooming.   Urinalysis dipstick: negative     Assessment & Plan:  Routine general medical examination at a health care facility - Plan: POCT Urinalysis DIP (Proadvantage Device)  Essential hypertension -well  controlled. Continue on current medication regimen.  Type 2 diabetes mellitus without complication, without long-term current use of insulin (HCC) - Plan: HgB A1c 6.3% and well controlled with diet and exercise  Class 1 obesity without serious comorbidity with body mass index (BMI) of 31.0 to 31.9 in adult, unspecified obesity type -counseling done on healthy lifestyle modifications with diet and exercise. She has lost a couple of lbs and is pleased.  Bipolar affective disorder, remission status unspecified ([place]) - stable. Plans to start seeing a new counselor. A list was provided for her.  Dyslipidemia -check fasting lipids  Immunizations up to date Mammogram up to date.  She will call her GI to follow up as recommended  Follow up pending labs.

## 2017-08-10 NOTE — Patient Instructions (Addendum)
Your hemoglobin A1c is 6.3% and your diabetes is well controlled.   Your blood pressure is norma today at 120/70   You can call to schedule your appointment with the Counselor. A few offices are listed below for you to call.   [facility] [address] [place], [place] [address] [phone]  [facility]  [place], [place], [place] [address]  Phone: [phone]  [place] [address] [place], [place] [address]  Phone: [phone]  [facility]  [address] [place],  [address] Phone: [phone]       Preventative Care for Adults - Female      [place]:  A routine yearly physical is a good way to check in with your primary care provider about your health and preventive screening. It is also an opportunity to share updates about your health and any concerns you have, and receive a thorough all-over exam.   Most health insurance companies pay for at least some preventative services.  Check with your health plan for specific coverages.  WHAT PREVENTATIVE SERVICES DO WOMEN NEED?  Adult women should have their weight and blood pressure checked regularly.   Women age 11 and older should have their cholesterol levels checked regularly.  Women should be screened for cervical cancer with a Pap smear and pelvic exam beginning at either age 66, or 3 years after they become sexually activity.    Breast cancer screening generally begins at age 37 with a mammogram and breast exam by your primary care provider.    Beginning at age 109 and continuing to age 74, women should be screened for colorectal cancer.  Certain people may need continued testing until age 68.  Updating vaccinations is part of preventative care.  Vaccinations help protect against diseases such as the flu.  Osteoporosis is a disease in  which the bones lose minerals and strength as we age. Women ages 36 and over should discuss this with their caregivers, as should women after menopause who have other risk factors.  Lab tests are generally done as part of preventative care to screen for anemia and blood disorders, to screen for problems with the kidneys and liver, to screen for bladder problems, to check blood sugar, and to check your cholesterol level.  Preventative services generally include counseling about diet, exercise, avoiding tobacco, drugs, excessive alcohol consumption, and sexually transmitted infections.    GENERAL RECOMMENDATIONS FOR GOOD HEALTH:  Healthy diet:  Eat a variety of foods, including fruit, vegetables, animal or vegetable protein, such as meat, fish, chicken, and eggs, or beans, lentils, tofu, and grains, such as rice.  Drink plenty of water daily.  Decrease saturated fat in the diet, avoid lots of red meat, processed foods, sweets, fast foods, and fried foods.  Exercise:  Aerobic exercise helps maintain good heart health. At least 30-40 minutes of moderate-intensity exercise is recommended. For example, a brisk walk that increases your heart rate and breathing. This should be done on most days of the week.   Find a type of exercise or a variety of exercises that you enjoy so that it becomes a part of your daily life.  Examples are running, walking, swimming, water aerobics, and biking.  For motivation and support, explore group exercise such as aerobic class, spin class, Zumba, Yoga,or  martial arts, etc.    Set exercise goals for  yourself, such as a certain weight goal, walk or run in a race such as a 5k walk/run.  Speak to your primary care provider about exercise goals.  Disease prevention:  If you smoke or chew tobacco, find out from your caregiver how to quit. It can literally save your life, no matter how long you have been a tobacco user. If you do not use tobacco, never begin.   Maintain  a healthy diet and normal weight. Increased weight leads to problems with blood pressure and diabetes.   The Body Mass Index or BMI is a way of measuring how much of your body is fat. Having a BMI above 27 increases the risk of heart disease, diabetes, hypertension, stroke and other problems related to obesity. Your caregiver can help determine your BMI and based on it develop an exercise and dietary program to help you achieve or maintain this important measurement at a healthful level.  High blood pressure causes heart and blood vessel problems.  Persistent high blood pressure should be treated with medicine if weight loss and exercise do not work.   Fat and cholesterol leaves deposits in your arteries that can block them. This causes heart disease and vessel disease elsewhere in your body.  If your cholesterol is found to be high, or if you have heart disease or certain other medical conditions, then you may need to have your cholesterol monitored frequently and be treated with medication.   Ask if you should have a cardiac stress test if your history suggests this. A stress test is a test done on a treadmill that looks for heart disease. This test can find disease prior to there being a problem.  Menopause can be associated with physical symptoms and risks. Hormone replacement therapy is available to decrease these. You should talk to your caregiver about whether starting or continuing to take hormones is right for you.   Osteoporosis is a disease in which the bones lose minerals and strength as we age. This can result in serious bone fractures. Risk of osteoporosis can be identified using a bone density scan. Women ages 59 and over should discuss this with their caregivers, as should women after menopause who have other risk factors. Ask your caregiver whether you should be taking a calcium supplement and Vitamin D, to reduce the rate of osteoporosis.   Avoid drinking alcohol in excess (more than  two drinks per day).  Avoid use of street drugs. Do not share needles with anyone. Ask for professional help if you need assistance or instructions on stopping the use of alcohol, cigarettes, and/or drugs.  Brush your teeth twice a day with fluoride toothpaste, and floss once a day. Good oral hygiene prevents tooth decay and gum disease. The problems can be painful, unattractive, and can cause other health problems. Visit your dentist for a routine oral and dental check up and preventive care every 6-12 months.   Look at your skin regularly.  Use a mirror to look at your back. Notify your caregivers of changes in moles, especially if there are changes in shapes, colors, a size larger than a pencil eraser, an irregular border, or development of new moles.  Safety:  Use seatbelts 100% of the time, whether driving or as a passenger.  Use safety devices such as hearing protection if you work in environments with loud noise or significant background noise.  Use safety glasses when doing any work that could send debris in to the eyes.  Use a  helmet if you ride a bike or motorcycle.  Use appropriate safety gear for contact sports.  Talk to your caregiver about gun safety.  Use sunscreen with a SPF (or skin protection factor) of 15 or greater.  Lighter skinned people are at a greater risk of skin cancer. Don't forget to also wear sunglasses in order to protect your eyes from too much damaging sunlight. Damaging sunlight can accelerate cataract formation.   Practice safe sex. Use condoms. Condoms are used for birth control and to help reduce the spread of sexually transmitted infections (or STIs).  Some of the STIs are gonorrhea (the clap), chlamydia, syphilis, trichomonas, herpes, HPV (human papilloma virus) and HIV (human immunodeficiency virus) which causes AIDS. The herpes, HIV and HPV are viral illnesses that have no cure. These can result in disability, cancer and death.   Keep carbon monoxide and smoke  detectors in your home functioning at all times. Change the batteries every 6 months or use a model that plugs into the wall.   Vaccinations:  Stay up to date with your tetanus shots and other required immunizations. You should have a booster for tetanus every 10 years. Be sure to get your flu shot every year, since 5%-20% of the [place] population comes down with the flu. The flu vaccine changes each year, so being vaccinated once is not enough. Get your shot in the fall, before the flu season peaks.   Other vaccines to consider:  Human Papilloma Virus or HPV causes cancer of the cervix, and other infections that can be transmitted from person to person. There is a vaccine for HPV, and females should get immunized between the ages of 52 and 12. It requires a series of 3 shots.   Pneumococcal vaccine to protect against certain types of pneumonia.  This is normally recommended for adults age 61 or older.  However, adults younger than 51 years old with certain underlying conditions such as diabetes, heart or lung disease should also receive the vaccine.  Shingles vaccine to protect against Varicella Zoster if you are older than age 60, or younger than 50 years old with certain underlying illness.  Hepatitis A vaccine to protect against a form of infection of the liver by a virus acquired from food.  Hepatitis B vaccine to protect against a form of infection of the liver by a virus acquired from blood or body fluids, particularly if you work in health care.  If you plan to travel internationally, check with your local health department for specific vaccination recommendations.  Cancer Screening:  Breast cancer screening is essential to preventive care for women. All women age 38 and older should perform a breast self-exam every month. At age 65 and older, women should have their caregiver complete a breast exam each year. Women at ages 43 and older should have a mammogram (x-ray film) of the breasts.  Your caregiver can discuss how often you need mammograms.    Cervical cancer screening includes taking a Pap smear (sample of cells examined under a microscope) from the cervix (end of the uterus). It also includes testing for HPV (Human Papilloma Virus, which can cause cervical cancer). Screening and a pelvic exam should begin at age 54, or 3 years after a woman becomes sexually active. Screening should occur every year, with a Pap smear but no HPV testing, up to age 64. After age 86, you should have a Pap smear every 3 years with HPV testing, if no HPV was found previously.  Most routine colon cancer screening begins at the age of 80. On a yearly basis, doctors may provide special easy to use take-home tests to check for hidden blood in the stool. Sigmoidoscopy or colonoscopy can detect the earliest forms of colon cancer and is life saving. These tests use a small camera at the end of a tube to directly examine the colon. Speak to your caregiver about this at age 28, when routine screening begins (and is repeated every 5 years unless early forms of pre-cancerous polyps or small growths are found).

## 2017-08-11 LAB — CBC WITH DIFFERENTIAL/PLATELET
Basophils Absolute: 0 10*3/uL (ref 0.0–0.2)
Basos: 1 %
EOS (ABSOLUTE): 0.2 10*3/uL (ref 0.0–0.4)
Eos: 3 %
Hematocrit: 35.8 % (ref 34.0–46.6)
Hemoglobin: 11.8 g/dL (ref 11.1–15.9)
Immature Grans (Abs): 0 10*3/uL (ref 0.0–0.1)
Immature Granulocytes: 0 %
Lymphocytes Absolute: 2.8 10*3/uL (ref 0.7–3.1)
Lymphs: 48 %
MCH: 28.6 pg (ref 26.6–33.0)
MCHC: 33 g/dL (ref 31.5–35.7)
MCV: 87 fL (ref 79–97)
Monocytes Absolute: 0.2 10*3/uL (ref 0.1–0.9)
Monocytes: 4 %
Neutrophils Absolute: 2.5 10*3/uL (ref 1.4–7.0)
Neutrophils: 44 %
Platelets: 297 10*3/uL (ref 150–450)
RBC: 4.12 x10E6/uL (ref 3.77–5.28)
RDW: 15.4 % (ref 12.3–15.4)
WBC: 5.6 10*3/uL (ref 3.4–10.8)

## 2017-08-11 LAB — RPR: RPR Ser Ql: NONREACTIVE

## 2017-08-11 LAB — LIPID PANEL
Chol/HDL Ratio: 5.1 ratio — ABNORMAL HIGH (ref 0.0–4.4)
Cholesterol, Total: 184 mg/dL (ref 100–199)
HDL: 36 mg/dL — ABNORMAL LOW (ref >39)
LDL Calculated: 119 mg/dL — ABNORMAL HIGH (ref 0–99)
Triglycerides: 144 mg/dL (ref 0–149)
VLDL Cholesterol Cal: 29 mg/dL (ref 5–40)

## 2017-08-11 LAB — COMPREHENSIVE METABOLIC PANEL
ALT: 12 IU/L (ref 0–32)
AST: 13 IU/L (ref 0–40)
Albumin/Globulin Ratio: 1.3 (ref 1.2–2.2)
Albumin: 4.2 g/dL (ref 3.5–5.5)
Alkaline Phosphatase: 49 IU/L (ref 39–117)
BUN/Creatinine Ratio: 18 (ref 9–23)
BUN: 14 mg/dL (ref 6–24)
Bilirubin Total: 0.7 mg/dL (ref 0.0–1.2)
CO2: 23 mmol/L (ref 20–29)
Calcium: 9.5 mg/dL (ref 8.7–10.2)
Chloride: 102 mmol/L (ref 96–106)
Creatinine, Ser: 0.78 mg/dL (ref 0.57–1.00)
GFR calc Af Amer: 103 mL/min/{1.73_m2} (ref >59)
GFR calc non Af Amer: 90 mL/min/{1.73_m2} (ref >59)
Globulin, Total: 3.3 g/dL (ref 1.5–4.5)
Glucose: 97 mg/dL (ref 65–99)
Potassium: 4.4 mmol/L (ref 3.5–5.2)
Sodium: 139 mmol/L (ref 134–144)
Total Protein: 7.5 g/dL (ref 6.0–8.5)

## 2017-08-11 LAB — HIV ANTIBODY (ROUTINE TESTING W REFLEX): HIV Screen 4th Generation wRfx: NONREACTIVE

## 2017-08-11 LAB — TSH: TSH: 1.1 u[IU]/mL (ref 0.450–4.500)

## 2017-08-12 LAB — GC/CHLAMYDIA PROBE AMP
Chlamydia trachomatis, NAA: NEGATIVE
Neisseria gonorrhoeae by PCR: NEGATIVE

## 2017-08-14 DIAGNOSIS — L218 Other seborrheic dermatitis: Secondary | ICD-10-CM | POA: Diagnosis not present

## 2017-08-16 ENCOUNTER — Encounter: Payer: Self-pay | Admitting: Family Medicine

## 2017-09-13 ENCOUNTER — Other Ambulatory Visit: Payer: Self-pay | Admitting: Family Medicine

## 2017-09-13 DIAGNOSIS — I10 Essential (primary) hypertension: Secondary | ICD-10-CM

## 2017-09-13 DIAGNOSIS — E119 Type 2 diabetes mellitus without complications: Secondary | ICD-10-CM

## 2017-10-15 ENCOUNTER — Encounter: Payer: Self-pay | Admitting: Family Medicine

## 2017-11-26 ENCOUNTER — Encounter: Payer: Self-pay | Admitting: Family Medicine

## 2017-11-28 ENCOUNTER — Ambulatory Visit (INDEPENDENT_AMBULATORY_CARE_PROVIDER_SITE_OTHER): Payer: BLUE CROSS/BLUE SHIELD | Admitting: Medical

## 2017-11-28 ENCOUNTER — Encounter: Payer: Self-pay | Admitting: Medical

## 2017-11-28 VITALS — BP 120/70 | HR 65 | Temp 97.8°F | Resp 16 | Ht 66.0 in | Wt 199.0 lb

## 2017-11-28 DIAGNOSIS — J029 Acute pharyngitis, unspecified: Secondary | ICD-10-CM | POA: Diagnosis not present

## 2017-11-28 DIAGNOSIS — J069 Acute upper respiratory infection, unspecified: Secondary | ICD-10-CM

## 2017-11-28 LAB — POCT RAPID STREP A (OFFICE): Rapid Strep A Screen: NEGATIVE

## 2017-11-28 NOTE — Patient Instructions (Signed)
Thank you for giving me the opportunity to serve you today.   Your diagnosis today includes: Encounter Diagnoses  Name Primary?  . Sore throat Yes  . Upper respiratory tract infection, unspecified type      Specific home care recommendations today include:  You can continue Dayquil and Nyquil for symptoms as long as its not increasing your blood pressure  Sore throat remedies:  You may use salt water gargles, warm fluids such as coffee or hot tea, or honey/tea/lemon mixture to sooth sore throat pain.  You may use OTC sore throat remedies such as Cepacol lozenges or Chloraseptic spray for sore throat pain.  Runny nose and sneezing remedies: You may use OTC antihistamine such as Zyrtec or Benadryl, but caution as these can cause drowsiness.    Pain/fever relief: You may use over-the-counter Tylenol for pain or fever  Drink extra fluids. Fluids help thin the mucus so your sinuses can drain more easily.   Applying either moist heat or ice packs to the sinus areas may help relieve discomfort.  Use saline nasal sprays to help moisten your sinuses. The sprays can be found at your local drugstore.    Please call or return if worse or not improving in the next few days.      Using Saline Nose Drops with Bulb Syringe A bulb syringe is used to clear your nose. You may use it when you have a stuffy nose, nasal congestion, sinus pressure, or sneezing.   SALINE SOLUTION You can buy nose drops at your local drug store. You can also make nose drops yourself. Mix 1 cup of water with  teaspoon of salt. Stir. Store this mixture at room temperature. Make a new batch daily.  USE THE BULB IN COMBINATION WITH SALINE NOSE DROPS  Squeeze the air out of the bulb before suctioning the saline mixture.  While still squeezing the bulb flat, place the tip of the bulb into the saline mixture.  Let air come back into the bulb.  This will suction up the saline mixture.  Gently flush one nostril at a  time.  Salt water nose drops will then moisten your  congested nose and loosen secretions before suctioning.  Use the bulb syringe as directed below to suction.  USING THE BULB SYRINGE TO SUCTION  While still squeezing the bulb flat, place the tip of the bulb into a nostril. Let air come back into the bulb. The suction will pull snot out of the nose and into the bulb.  Repeat on the other nostril.  Squeeze syringe several times into a tissue.  CLEANING THE BULB SYRINGE  Clean the bulb syringe every day with hot soapy water.  Clean the inside of the bulb by squeezing the bulb while the tip is in soapy water.  Rinse by squeezing the bulb while the tip is in clean hot water.  Store the bulb with the tip side down on paper towel.  HOME CARE INSTRUCTIONS   Use saline nose drops often to keep the nose open and not stuffy.  Throw away used salt water. Make a new solution every time.  Do not use the same solution and dropper for another person  If you do not prefer to use nasal saline flush, other options include nasal saline spray or the [employer], both of which are available over the counter at your pharmacy.       I have included other useful information below for your review.   Upper Respiratory  Infection, Adult An upper respiratory infection (URI) is also known as the common cold. It is often caused by a type of germ (virus). Colds are easily spread (contagious). You can pass it to others by kissing, coughing, sneezing, or drinking out of the same glass. Usually, you get better in 1 or 2 weeks.  HOME CARE   Only take medicine as told by your doctor.   Use a warm mist humidifier or breathe in steam from a hot shower.   Drink enough water and fluids to keep your pee (urine) clear or pale yellow.   Get plenty of rest.   Return to work when your temperature is back to normal or as told by your doctor. You may use a face mask and wash your hands to stop your cold from  spreading.  GET HELP RIGHT AWAY IF:   After the first few days, you feel you are getting worse.   You have questions about your medicine.   You have chills, shortness of breath, or brown or red spit (mucus).   You have yellow or brown snot (nasal discharge) or pain in the face, especially when you bend forward.   You have a fever, puffy (swollen) neck, pain when you swallow, or white spots in the back of your throat.   You have a bad headache, ear pain, sinus pain, or chest pain.   You have a high-pitched whistling sound when you breathe in and out (wheezing).   You have a lasting cough or cough up blood.   You have sore muscles or a stiff neck.  MAKE SURE YOU:   Understand these instructions.   Will watch your condition.   Will get help right away if you are not doing well or get worse.  Document Released: 08/10/2007 Document Revised: 11/03/2010 Document Reviewed: 06/28/2010 [facility] Patient Information 2012 [place].

## 2017-11-28 NOTE — Progress Notes (Signed)
Subjective: Chief Complaint  Patient presents with  . sore throat    sore throat, achey, fever, chills X sunday   Here for illness x 3 days with sore throat, chills, achy, fever, not feeling well, weak, headache.   Has some sick contacts.   No cough.   Has had some nausea, but no vomiting, some achy stomach, but no back pain.   Fever is subjective.   Using metamucil.   Used some dayquil, drinking plenty of water.  Has nose stopped up.   No other aggravating or relieving factors. No other complaint.   Past Medical History:  Diagnosis Date  . Acute cholecystitis 07/14/2013  . Anemia    has history, takes iron  . Bipolar affective disorder ([place]) 10/26/2015   Has seen counselor, declined medications per medical record from [facility]. hypermanic  . Chest pain 07/14/2013  . Chronic vaginitis 10/26/2015   Has h/o vag dryness and tried Estrace per [place] records  . Diabetes ([employer])   . Diverticulitis    no problems  . Dysfunctional uterine bleeding 03/02/2011  . Elevated hemoglobin A1c 10/26/2015  . GERD (gastroesophageal reflux disease)    tums prn  . Headache(784.0)   . Hemorrhoids   . HTN (hypertension)    BP meds since fall 2016  . Internal hemorrhoids   . Nausea & vomiting 07/14/2013  . Obesity 10/26/2015  . Rectal bleeding 03/15/2017   Current Outpatient Medications on File Prior to Visit  Medication Sig Dispense Refill  . lisinopril-hydrochlorothiazide (PRINZIDE,ZESTORETIC) 10-12.5 MG tablet TAKE 1 TABLET BY MOUTH ONCE DAILY 30 tablet 5  . meloxicam (MOBIC) 15 MG tablet Take 1 tablet (15 mg total) by mouth daily. 30 tablet 1  . Multiple Vitamins-Minerals (MULTI-VITAMIN GUMMIES PO) Take 1 each by mouth. [employer] food gummies     No current facility-administered medications on file prior to visit.    ROS as in subjective     Objective: BP 120/70   Pulse 65   Temp 97.8 F (36.6 C) (Oral)   Resp 16   Ht 5\' 6"  (1.676 m)   Wt 199 lb (90.3 kg)   LMP 04/25/2011   SpO2 98%    BMI 32.12 kg/m   Wt Readings from Last 3 Encounters:  11/28/17 199 lb (90.3 kg)  08/10/17 199 lb 9.6 oz (90.5 kg)  05/01/17 201 lb 1.9 oz (91.2 kg)   General appearance: Alert, WD/WN, no distress,somewhat ill appearing                             Skin: warm, no rash, no diaphoresis                           Head: no sinus tenderness                            Eyes: conjunctiva normal, corneas clear, PERRLA                            Ears: pearly TMs, external ear canals normal                          Nose: septum midline, turbinates swollen, with erythema and clear discharge             Mouth/throat: MMM,  tongue normal, mild pharyngeal erythema                           Neck: supple, no adenopathy, no thyromegaly, non tender                          Heart: RRR, normal S1, S2, no murmurs                         Lungs: clear,no rhonchi, no wheezes, no rales                Extremities: no edema, non tender       Assessment: Encounter Diagnoses  Name Primary?  . Sore throat Yes  . Upper respiratory tract infection, unspecified type      Plan: Symptoms and exam suggest viral URI.   Strep negative. Discussed supportive care, rest, hydration, and discussed preventative measures.      Specific home care recommendations today include:  You can continue Dayquil and Nyquil for symptoms as long as its not increasing your blood pressure  Sore throat remedies:  You may use salt water gargles, warm fluids such as coffee or hot tea, or honey/tea/lemon mixture to sooth sore throat pain.  You may use OTC sore throat remedies such as Cepacol lozenges or Chloraseptic spray for sore throat pain.  Runny nose and sneezing remedies: You may use OTC antihistamine such as Zyrtec or Benadryl, but caution as these can cause drowsiness.    Pain/fever relief: You may use over-the-counter Tylenol for pain or fever  Drink extra fluids. Fluids help thin the mucus so your sinuses can drain more easily.    Applying either moist heat or ice packs to the sinus areas may help relieve discomfort.  Use saline nasal sprays to help moisten your sinuses. The sprays can be found at your local drugstore.     Deborah Haney was seen today for sore throat.  Diagnoses and all orders for this visit:  Sore throat -     Rapid Strep A  Upper respiratory tract infection, unspecified type

## 2018-01-01 ENCOUNTER — Ambulatory Visit
Admission: RE | Admit: 2018-01-01 | Discharge: 2018-01-01 | Disposition: A | Discharge status: Home / Self Care | Payer: BLUE CROSS/BLUE SHIELD | Source: Ambulatory Visit | Attending: Family Medicine | Admitting: Family Medicine

## 2018-01-01 ENCOUNTER — Ambulatory Visit: Payer: BLUE CROSS/BLUE SHIELD | Admitting: Family Medicine

## 2018-01-01 ENCOUNTER — Encounter: Payer: Self-pay | Admitting: Family Medicine

## 2018-01-01 VITALS — BP 124/74 | HR 74 | Temp 97.6°F | Resp 16 | Wt 199.2 lb

## 2018-01-01 DIAGNOSIS — M5431 Sciatica, right side: Secondary | ICD-10-CM

## 2018-01-01 DIAGNOSIS — M48061 Spinal stenosis, lumbar region without neurogenic claudication: Secondary | ICD-10-CM | POA: Diagnosis not present

## 2018-01-01 MED ORDER — PREDNISONE 10 MG (21) PO TBPK: ORAL_TABLET | Freq: Every day | ORAL | 0 refills | Status: DC

## 2018-01-01 NOTE — Progress Notes (Signed)
   Subjective:    Patient ID: [name], female    DOB: 09/29/1965, 52 y.o.   MRN: [id number]  HPI Chief Complaint  Patient presents with  . leg pain    leg pain on saturday. pain from buttocks to bottom of foot on right side. sharp pain, tingling. couldn't walk much   Complains of right buttock pain that shoots down her posterior leg. No known injury. Pain onset was acute and started while she was driving 3 days ago. Since then the pain has been intermittent. Pain is worse with certain movements and with walking.  Some numbness at times. Questionable weakness. States she feels her leg will give out.  Reports she has worse pain when laying on her right side.  No loss of control of bowels or bladder. No saddle anesthesia.   Denies fever, chills, unexplained weight loss, chest pain, palpitations, shortness of breath, cough, abdominal pain, N/V/D, LE Edema.  Denies personal or family history of blood clot. No recent surgery or immobilization.   She tried Ibuprofen 200 mg yesterday and last night states she used CBD oil under her tongue.   Reviewed allergies, medications, past medical, surgical, family, and social history.   Review of Systems Pertinent positives and negatives in the history of present illness.     Objective:   Physical Exam  Constitutional: She is oriented to person, place, and time. She appears well-developed and well-nourished. No distress.  HENT:  Mouth/Throat: Oropharynx is clear and moist.  Eyes: Pupils are equal, round, and reactive to light. Conjunctivae are normal.  Neck: Normal range of motion. Neck supple.  Cardiovascular: Normal rate, regular rhythm, normal heart sounds and intact distal pulses.  Pulmonary/Chest: Effort normal and breath sounds normal.  Musculoskeletal:       Cervical back: Normal.       Thoracic back: Normal.       Lumbar back: She exhibits pain. She exhibits normal range of motion, no tenderness, no bony tenderness and no spasm.    Right low back pain with extension and right lateral bending and twisting. Reports pain shoots down her right posterior leg to her knee.  No tenderness over SI joint.  Normal sensation and motion of low back.  Bilateral LE are neurovascularly intact.  Negative straight leg raise.  Symmetric, soft and non tender calves. Negative Homan's test.   Neurological: She is alert and oriented to person, place, and time. She has normal strength. She displays normal reflexes. No cranial nerve deficit or sensory deficit. Coordination and gait normal.  Skin: Skin is warm and dry. Capillary refill takes less than 2 seconds.  Psychiatric: She has a normal mood and affect. Her behavior is normal. Thought content normal.   BP 124/74   Pulse 74   Temp 97.6 F (36.4 C) (Oral)   Resp 16   Wt 199 lb 3.2 oz (90.4 kg)   LMP 04/25/2011   SpO2 98%   BMI 32.15 kg/m       Assessment & Plan:  Sciatica of right side - Plan: DG Lumbar Spine Complete, predniSONE (STERAPRED UNI-PAK 21 TAB) 10 MG (21) TBPK tablet  No sign of infectious process or DVT. No neurological deficits.  Plan to send her for lumbar XR and treat conservatively. Cannot tolerate NSAIDs due to GI bleeding history. Steroid dose pak prescribed. She will also try heat or ice and topical analgesic.  Follow up pending XR. Consider PT or ortho referral.  Treat conservatively.

## 2018-01-02 ENCOUNTER — Other Ambulatory Visit: Payer: Self-pay | Admitting: Internal Medicine

## 2018-01-02 DIAGNOSIS — M5431 Sciatica, right side: Secondary | ICD-10-CM

## 2018-01-04 DIAGNOSIS — M545 Low back pain: Secondary | ICD-10-CM | POA: Diagnosis not present

## 2018-01-04 DIAGNOSIS — M6281 Muscle weakness (generalized): Secondary | ICD-10-CM | POA: Diagnosis not present

## 2018-01-15 ENCOUNTER — Encounter: Payer: Self-pay | Admitting: Family Medicine

## 2018-01-15 NOTE — Telephone Encounter (Signed)
This is a patient of Dr. Ardis Hughs I saw her for hemorrhoidal banding, but it does not appear she completed the protocol That said, he had previously recommended repeat colonoscopy at this time due to suboptimal prep 5 years ago Thus, based on his previous recommendation and her report of bleeding, please schedule her for colonoscopy with Dr. Ardis Hughs

## 2018-01-17 ENCOUNTER — Encounter: Payer: Self-pay | Admitting: Gastroenterology

## 2018-01-18 DIAGNOSIS — M545 Low back pain: Secondary | ICD-10-CM | POA: Diagnosis not present

## 2018-01-18 DIAGNOSIS — M6281 Muscle weakness (generalized): Secondary | ICD-10-CM | POA: Diagnosis not present

## 2018-01-29 ENCOUNTER — Encounter: Payer: Self-pay | Admitting: Family Medicine

## 2018-01-29 ENCOUNTER — Ambulatory Visit (INDEPENDENT_AMBULATORY_CARE_PROVIDER_SITE_OTHER): Payer: BLUE CROSS/BLUE SHIELD | Admitting: Family Medicine

## 2018-01-29 VITALS — BP 124/80 | HR 70 | Temp 98.1°F | Wt 203.2 lb

## 2018-01-29 DIAGNOSIS — N898 Other specified noninflammatory disorders of vagina: Secondary | ICD-10-CM | POA: Diagnosis not present

## 2018-01-29 DIAGNOSIS — R7303 Prediabetes: Secondary | ICD-10-CM

## 2018-01-29 DIAGNOSIS — B9689 Other specified bacterial agents as the cause of diseases classified elsewhere: Secondary | ICD-10-CM

## 2018-01-29 DIAGNOSIS — N9089 Other specified noninflammatory disorders of vulva and perineum: Secondary | ICD-10-CM | POA: Diagnosis not present

## 2018-01-29 DIAGNOSIS — N760 Acute vaginitis: Secondary | ICD-10-CM

## 2018-01-29 DIAGNOSIS — B373 Candidiasis of vulva and vagina: Secondary | ICD-10-CM | POA: Diagnosis not present

## 2018-01-29 DIAGNOSIS — B3731 Acute candidiasis of vulva and vagina: Secondary | ICD-10-CM

## 2018-01-29 LAB — POCT WET PREP (WET MOUNT)
Clue Cells Wet Prep Whiff POC: POSITIVE
Trichomonas Wet Prep HPF POC: ABSENT

## 2018-01-29 MED ORDER — MUPIROCIN 2 % EX OINT: TOPICAL_OINTMENT | CUTANEOUS | 0 refills | Status: DC

## 2018-01-29 MED ORDER — METRONIDAZOLE 0.75 % VA GEL: 1.0000 | Freq: Every day | VAGINAL | 0 refills | Status: DC

## 2018-01-29 MED ORDER — FLUCONAZOLE 150 MG PO TABS: 150.0000 mg | ORAL_TABLET | Freq: Once | ORAL | 0 refills | Status: AC

## 2018-01-29 NOTE — Patient Instructions (Signed)
Use the metronidazole as directed and take the diflucan for yeast.   Use the mupirocin ointment and do warm compresses. If the area is getting worse you should be seen again.   We will call you with your lab results.

## 2018-01-29 NOTE — Progress Notes (Signed)
   Subjective:    Patient ID: Deborah Haney, female    DOB: 1965/09/08, 52 y.o.   MRN: [id number]  HPI Chief Complaint  Patient presents with  . vaginal issues    discharge is heavy, boil in the vaginal area and then side,  light green stool. going on about a week   She is here with complaints of vaginal discharge x 1 week as well as a gradually improving sore on her right labia. States she has been sexually active but with her same partner.  +vaginal irritation and itching.   States initially she had 2 sores externally and one felt like an abscess so she used heat and vinegar and the left sided sore cleared up.    Denies fever, chills, nausea, vomiting, abdominal pain, back pain, urinary symptoms.   Reviewed allergies, medications, past medical, surgical, family, and social history.    Review of Systems Pertinent positives and negatives in the history of present illness.     Objective:   Physical Exam  Constitutional: She is oriented to person, place, and time. She appears well-developed and well-nourished. No distress.  HENT:  Mouth/Throat: Oropharynx is clear and moist.  Eyes: Pupils are equal, round, and reactive to light. Conjunctivae are normal.  Neck: Normal range of motion. Neck supple.  Abdominal: Soft. She exhibits no distension. There is no tenderness.  Genitourinary: There is lesion on the right labia.  Genitourinary Comments: 0.5 cm area of firmness with an open lesion to her right superior labia majora.  No induration or fluctuance. No surrounding erythema.  Small amount of serosanguinous fluid expelled upon exam.   Lymphadenopathy: No inguinal adenopathy noted on the right or left side.  Neurological: She is alert and oriented to person, place, and time.  Skin: Skin is warm and dry.  Psychiatric: She has a normal mood and affect. Her behavior is normal. Thought content normal.   BP 124/80   Pulse 70   Temp 98.1 F (36.7 C) (Oral)   Wt 203 lb 3.2 oz (92.2  kg)   LMP 04/25/2011   SpO2 98%   BMI 32.80 kg/m        Assessment & Plan:  Vaginal discharge - Plan: CBC with Differential/Platelet, RPR, HIV Antibody (routine testing w rflx), NuSwab Vaginitis Plus (VG+), POCT Wet Prep (Wet Mount)  Labial lesion - Plan: CBC with Differential/Platelet, NuSwab Vaginitis Plus (VG+), Herpes simplex virus culture, mupirocin ointment (BACTROBAN) 2 %  BV (bacterial vaginosis) - Plan: metroNIDAZOLE (METROGEL VAGINAL) 0.75 % vaginal gel  Candida vaginitis - Plan: fluconazole (DIFLUCAN) 150 MG tablet  Prediabetes - Plan: CBC with Differential/Platelet, Hemoglobin A1c  Wet mount +BV, yeast. Negative trichomonas. She prefers topical Metrogel. Diflucan also ordered.  Screen for STDs including HIV, RPR. Viral herpes swab obtained. No known history of MRSA.  Check CBC and Hgb A1c due to labial lesions and history of prediabetes.  She is aware that if the area does not continue improving or if it gets much worse that she should be seen again. No indication for I & D today. Continue warm compresses and use the mupirocin ointment.

## 2018-01-30 LAB — CBC WITH DIFFERENTIAL/PLATELET
Basophils Absolute: 0 10*3/uL (ref 0.0–0.2)
Basos: 1 %
EOS (ABSOLUTE): 0.2 10*3/uL (ref 0.0–0.4)
Eos: 3 %
Hematocrit: 34.8 % (ref 34.0–46.6)
Hemoglobin: 11.4 g/dL (ref 11.1–15.9)
Immature Grans (Abs): 0 10*3/uL (ref 0.0–0.1)
Immature Granulocytes: 0 %
Lymphocytes Absolute: 2.5 10*3/uL (ref 0.7–3.1)
Lymphs: 39 %
MCH: 28.4 pg (ref 26.6–33.0)
MCHC: 32.8 g/dL (ref 31.5–35.7)
MCV: 87 fL (ref 79–97)
Monocytes Absolute: 0.4 10*3/uL (ref 0.1–0.9)
Monocytes: 6 %
Neutrophils Absolute: 3.3 10*3/uL (ref 1.4–7.0)
Neutrophils: 51 %
Platelets: 291 10*3/uL (ref 150–450)
RBC: 4.01 x10E6/uL (ref 3.77–5.28)
RDW: 14.2 % (ref 12.3–15.4)
WBC: 6.5 10*3/uL (ref 3.4–10.8)

## 2018-01-30 LAB — HEMOGLOBIN A1C
Est. average glucose Bld gHb Est-mCnc: 137 mg/dL
Hgb A1c MFr Bld: 6.4 % — ABNORMAL HIGH (ref 4.8–5.6)

## 2018-01-30 LAB — RPR: RPR Ser Ql: NONREACTIVE

## 2018-01-30 LAB — HIV ANTIBODY (ROUTINE TESTING W REFLEX): HIV Screen 4th Generation wRfx: NONREACTIVE

## 2018-02-02 LAB — NUSWAB VAGINITIS PLUS (VG+)
BVAB 2: HIGH Score — AB
Candida albicans, NAA: NEGATIVE
Candida glabrata, NAA: NEGATIVE
Chlamydia trachomatis, NAA: NEGATIVE
Megasphaera 1: HIGH Score — AB
Neisseria gonorrhoeae, NAA: NEGATIVE
Trich vag by NAA: NEGATIVE

## 2018-02-02 LAB — HERPES SIMPLEX VIRUS CULTURE

## 2018-02-20 ENCOUNTER — Ambulatory Visit (AMBULATORY_SURGERY_CENTER): Payer: Self-pay | Admitting: *Deleted

## 2018-02-20 ENCOUNTER — Encounter: Payer: Self-pay | Admitting: Gastroenterology

## 2018-02-20 VITALS — Ht 66.0 in | Wt 203.0 lb

## 2018-02-20 DIAGNOSIS — Z1211 Encounter for screening for malignant neoplasm of colon: Secondary | ICD-10-CM

## 2018-02-20 MED ORDER — PEG 3350-KCL-NA BICARB-NACL 420 G PO SOLR: 4000.0000 mL | Freq: Once | ORAL | 0 refills | Status: AC

## 2018-02-20 NOTE — Progress Notes (Signed)
No egg or soy allergy known to patient  No issues with past sedation with any surgeries  or procedures, no intubation problems  No diet pills per patient No home 02 use per patient  No blood thinners per patient  Pt denies issues with constipation  No A fib or A flutter  EMMI video sent to pt's e mail --  2 day prep- last colon prep was fair-  Pt was scheduled for 12-23 but will be OOT and having to travel- RS pt to 12-27 Friday with pt in [place] today

## 2018-02-26 ENCOUNTER — Encounter: Payer: BLUE CROSS/BLUE SHIELD | Admitting: Gastroenterology

## 2018-03-02 ENCOUNTER — Encounter: Payer: BLUE CROSS/BLUE SHIELD | Admitting: Gastroenterology

## 2018-03-04 ENCOUNTER — Encounter: Payer: Self-pay | Admitting: Family Medicine

## 2018-03-04 DIAGNOSIS — R300 Dysuria: Secondary | ICD-10-CM | POA: Diagnosis not present

## 2018-03-05 ENCOUNTER — Ambulatory Visit: Payer: BLUE CROSS/BLUE SHIELD | Admitting: Medical

## 2018-03-05 VITALS — BP 110/72 | HR 64 | Temp 97.9°F | Resp 16 | Wt 204.2 lb

## 2018-03-05 DIAGNOSIS — R319 Hematuria, unspecified: Secondary | ICD-10-CM | POA: Diagnosis not present

## 2018-03-05 DIAGNOSIS — N3001 Acute cystitis with hematuria: Secondary | ICD-10-CM | POA: Diagnosis not present

## 2018-03-05 DIAGNOSIS — R350 Frequency of micturition: Secondary | ICD-10-CM

## 2018-03-05 DIAGNOSIS — R300 Dysuria: Secondary | ICD-10-CM

## 2018-03-05 LAB — POCT URINALYSIS DIP (PROADVANTAGE DEVICE)
Bilirubin, UA: NEGATIVE
Glucose, UA: NEGATIVE mg/dL
Ketones, POC UA: NEGATIVE mg/dL
Nitrite, UA: NEGATIVE
Protein Ur, POC: 30 mg/dL — AB
Specific Gravity, Urine: 1.02
Urobilinogen, Ur: NEGATIVE
pH, UA: 6 (ref 5.0–8.0)

## 2018-03-05 MED ORDER — CEFTRIAXONE SODIUM 1 G IJ SOLR
1.0000 g | Freq: Once | INTRAMUSCULAR | Status: AC
  Administered 2018-03-05: 1 g via INTRAMUSCULAR

## 2018-03-05 MED ORDER — SULFAMETHOXAZOLE-TRIMETHOPRIM 800-160 MG PO TABS: 1.0000 | ORAL_TABLET | Freq: Two times a day (BID) | ORAL | 0 refills | Status: DC

## 2018-03-05 NOTE — Progress Notes (Signed)
Subjective: Chief Complaint  Patient presents with  . urine issue    urinary issue- low back pain, burning, frequency. found blood in urine this morning   Here for possible urinary tract infection.  She notes symptoms started 3 days ago with burning when she urinates, urinary frequency, urinary urgency, has felt nauseated, has had some lower belly pain but in the last 24 hours has seen visible blood in the urine some, has felt worse nauseated and some back pain.  She denies vomiting, no fever, no rash, no vaginal discharge although she has been treated for bacterial vaginosis recently with metronidazole.  She has a same sexual partner for the past year.  Is sexually active.  She is status post hysterectomy.  No other aggravating or relieving factors. No other complaint.  Past Medical History:  Diagnosis Date  . Acute cholecystitis 07/14/2013  . Anemia    has history, takes iron  . Anxiety   . Bipolar affective disorder ([place]) 10/26/2015   Has seen counselor, declined medications per medical record from [facility]. hypermanic  . Chest pain 07/14/2013  . Chronic vaginitis 10/26/2015   Has h/o vag dryness and tried Estrace per [place] records  . Depression   . Diabetes ([place])   . Diverticulitis    no problems  . Dysfunctional uterine bleeding 03/02/2011  . Elevated hemoglobin A1c 10/26/2015  . GERD (gastroesophageal reflux disease)    tums prn  . Headache(784.0)   . Hemorrhoids   . HTN (hypertension)    BP meds since fall 2016  . Internal hemorrhoids   . Nausea & vomiting 07/14/2013  . Obesity 10/26/2015  . Rectal bleeding 03/15/2017   Current Outpatient Medications on File Prior to Visit  Medication Sig Dispense Refill  . lisinopril-hydrochlorothiazide (PRINZIDE,ZESTORETIC) 10-12.5 MG tablet TAKE 1 TABLET BY MOUTH ONCE DAILY 30 tablet 5  . metroNIDAZOLE (METROGEL VAGINAL) 0.75 % vaginal gel Place 1 Applicatorful vaginally at bedtime. 70 g 0  . mupirocin ointment (BACTROBAN) 2 % Apply thin layer  to affected area 3 times daily x 7 days. 22 g 0   No current facility-administered medications on file prior to visit.    ROS as in subjective   Objective: BP 110/72   Pulse 64   Temp 97.9 F (36.6 C) (Oral)   Resp 16   Wt 204 lb 3.2 oz (92.6 kg)   LMP 04/25/2011   SpO2 98%   BMI 32.96 kg/m   General appearance: alert, no distress, WD/WN,  Oral cavity: MMM, no lesions Lungs: CTA bilaterally, no wheezes, rhonchi, or rales Abdomen: +bs, soft, mild lower abdominal tenderness, otherwise non tender, non distended, no masses, no hepatomegaly, no splenomegaly Back: mild left CVA tenderness Pulses: 2+ symmetric, upper and lower extremities, normal cap refill Ext: no edema   Assessment: Encounter Diagnoses  Name Primary?  . Frequency of urination Yes  . Burning with urination   . Hematuria, unspecified type   . Acute cystitis with hematuria      Plan: We discussed symptoms and exam suggestive of urinary tract infection versus early pyelonephritis.  1 g of Rocephin given IM in the office, begin medication below, will await culture done in urgent care 2 days ago.  Hydrate well, rest, advised to call, return, or go to the emergency department if worsening.  Devanshi was seen today for urine issue.  Diagnoses and all orders for this visit:  Frequency of urination -     POCT Urinalysis DIP (Proadvantage Device) -  Cancel: CBC with Differential/Platelet  Burning with urination -     POCT Urinalysis DIP (Proadvantage Device) -     Cancel: CBC with Differential/Platelet  Hematuria, unspecified type -     POCT Urinalysis DIP (Proadvantage Device) -     Cancel: CBC with Differential/Platelet  Acute cystitis with hematuria -     cefTRIAXone (ROCEPHIN) injection 1 g  Other orders -     sulfamethoxazole-trimethoprim (BACTRIM DS,SEPTRA DS) 800-160 MG tablet; Take 1 tablet by mouth 2 (two) times daily.

## 2018-03-06 ENCOUNTER — Ambulatory Visit: Payer: BLUE CROSS/BLUE SHIELD | Admitting: Medical

## 2018-03-30 ENCOUNTER — Other Ambulatory Visit: Payer: Self-pay | Admitting: Family Medicine

## 2018-03-30 ENCOUNTER — Encounter: Payer: Self-pay | Admitting: Family Medicine

## 2018-03-30 DIAGNOSIS — E119 Type 2 diabetes mellitus without complications: Secondary | ICD-10-CM

## 2018-03-30 DIAGNOSIS — I10 Essential (primary) hypertension: Secondary | ICD-10-CM

## 2018-04-02 ENCOUNTER — Emergency Department (HOSPITAL_BASED_OUTPATIENT_CLINIC_OR_DEPARTMENT_OTHER): Payer: BLUE CROSS/BLUE SHIELD

## 2018-04-02 ENCOUNTER — Other Ambulatory Visit: Payer: Self-pay

## 2018-04-02 ENCOUNTER — Encounter (HOSPITAL_BASED_OUTPATIENT_CLINIC_OR_DEPARTMENT_OTHER): Payer: Self-pay | Admitting: *Deleted

## 2018-04-02 ENCOUNTER — Encounter: Payer: Self-pay | Admitting: Family Medicine

## 2018-04-02 ENCOUNTER — Emergency Department (HOSPITAL_BASED_OUTPATIENT_CLINIC_OR_DEPARTMENT_OTHER)
Admission: EM | Admit: 2018-04-02 | Discharge: 2018-04-02 | Disposition: A | Discharge status: Home / Self Care | Payer: BLUE CROSS/BLUE SHIELD | Attending: Emergency Medicine | Admitting: Emergency Medicine

## 2018-04-02 ENCOUNTER — Ambulatory Visit: Payer: BLUE CROSS/BLUE SHIELD | Admitting: Family Medicine

## 2018-04-02 VITALS — BP 120/80 | HR 71 | Temp 98.2°F | Resp 16 | Wt 206.0 lb

## 2018-04-02 DIAGNOSIS — Z87891 Personal history of nicotine dependence: Secondary | ICD-10-CM | POA: Diagnosis not present

## 2018-04-02 DIAGNOSIS — I10 Essential (primary) hypertension: Secondary | ICD-10-CM | POA: Insufficient documentation

## 2018-04-02 DIAGNOSIS — R053 Chronic cough: Secondary | ICD-10-CM

## 2018-04-02 DIAGNOSIS — R2241 Localized swelling, mass and lump, right lower limb: Secondary | ICD-10-CM | POA: Insufficient documentation

## 2018-04-02 DIAGNOSIS — M79604 Pain in right leg: Secondary | ICD-10-CM | POA: Diagnosis not present

## 2018-04-02 DIAGNOSIS — Z79899 Other long term (current) drug therapy: Secondary | ICD-10-CM | POA: Diagnosis not present

## 2018-04-02 DIAGNOSIS — M79661 Pain in right lower leg: Secondary | ICD-10-CM

## 2018-04-02 DIAGNOSIS — R809 Proteinuria, unspecified: Secondary | ICD-10-CM | POA: Diagnosis not present

## 2018-04-02 DIAGNOSIS — Z111 Encounter for screening for respiratory tuberculosis: Secondary | ICD-10-CM

## 2018-04-02 DIAGNOSIS — J9809 Other diseases of bronchus, not elsewhere classified: Secondary | ICD-10-CM | POA: Diagnosis not present

## 2018-04-02 DIAGNOSIS — R0602 Shortness of breath: Secondary | ICD-10-CM | POA: Diagnosis not present

## 2018-04-02 DIAGNOSIS — M7989 Other specified soft tissue disorders: Secondary | ICD-10-CM | POA: Diagnosis not present

## 2018-04-02 DIAGNOSIS — E119 Type 2 diabetes mellitus without complications: Secondary | ICD-10-CM | POA: Insufficient documentation

## 2018-04-02 DIAGNOSIS — R05 Cough: Secondary | ICD-10-CM | POA: Diagnosis not present

## 2018-04-02 DIAGNOSIS — R0600 Dyspnea, unspecified: Secondary | ICD-10-CM

## 2018-04-02 DIAGNOSIS — R0609 Other forms of dyspnea: Secondary | ICD-10-CM | POA: Diagnosis not present

## 2018-04-02 DIAGNOSIS — R109 Unspecified abdominal pain: Secondary | ICD-10-CM | POA: Diagnosis not present

## 2018-04-02 DIAGNOSIS — J40 Bronchitis, not specified as acute or chronic: Secondary | ICD-10-CM

## 2018-04-02 LAB — CBC WITH DIFFERENTIAL/PLATELET
Abs Immature Granulocytes: 0.02 10*3/uL (ref 0.00–0.07)
Basophils Absolute: 0 10*3/uL (ref 0.0–0.1)
Basophils Relative: 1 %
Eosinophils Absolute: 0.2 10*3/uL (ref 0.0–0.5)
Eosinophils Relative: 4 %
HCT: 37.7 % (ref 36.0–46.0)
Hemoglobin: 12.3 g/dL (ref 12.0–15.0)
Immature Granulocytes: 0 %
Lymphocytes Relative: 55 %
Lymphs Abs: 3.2 10*3/uL (ref 0.7–4.0)
MCH: 29.2 pg (ref 26.0–34.0)
MCHC: 32.6 g/dL (ref 30.0–36.0)
MCV: 89.5 fL (ref 80.0–100.0)
Monocytes Absolute: 0.4 10*3/uL (ref 0.1–1.0)
Monocytes Relative: 7 %
Neutro Abs: 1.9 10*3/uL (ref 1.7–7.7)
Neutrophils Relative %: 33 %
Platelets: 304 10*3/uL (ref 150–400)
RBC: 4.21 MIL/uL (ref 3.87–5.11)
RDW: 14.6 % (ref 11.5–15.5)
WBC: 5.7 10*3/uL (ref 4.0–10.5)
nRBC: 0 % (ref 0.0–0.2)

## 2018-04-02 LAB — POCT URINALYSIS DIP (PROADVANTAGE DEVICE)
Bilirubin, UA: NEGATIVE
Blood, UA: NEGATIVE
Glucose, UA: NEGATIVE mg/dL
Ketones, POC UA: NEGATIVE mg/dL
Leukocytes, UA: NEGATIVE
Nitrite, UA: NEGATIVE
Protein Ur, POC: NEGATIVE mg/dL
Specific Gravity, Urine: 1.015
Urobilinogen, Ur: NEGATIVE
pH, UA: 7 (ref 5.0–8.0)

## 2018-04-02 LAB — COMPREHENSIVE METABOLIC PANEL
ALT: 18 U/L (ref 0–44)
AST: 17 U/L (ref 15–41)
Albumin: 3.6 g/dL (ref 3.5–5.0)
Alkaline Phosphatase: 41 U/L (ref 38–126)
Anion gap: 7 (ref 5–15)
BUN: 12 mg/dL (ref 6–20)
CO2: 27 mmol/L (ref 22–32)
Calcium: 8.9 mg/dL (ref 8.9–10.3)
Chloride: 102 mmol/L (ref 98–111)
Creatinine, Ser: 0.81 mg/dL (ref 0.44–1.00)
GFR calc Af Amer: >60 mL/min (ref >60)
GFR calc non Af Amer: >60 mL/min (ref >60)
Glucose, Bld: 111 mg/dL — ABNORMAL HIGH (ref 70–99)
Potassium: 3.3 mmol/L — ABNORMAL LOW (ref 3.5–5.1)
Sodium: 136 mmol/L (ref 135–145)
Total Bilirubin: 0.6 mg/dL (ref 0.3–1.2)
Total Protein: 7.9 g/dL (ref 6.5–8.1)

## 2018-04-02 LAB — TROPONIN I: Troponin I: <0.03 ng/mL (ref <0.03)

## 2018-04-02 MED ORDER — AZITHROMYCIN 250 MG PO TABS: 500.0000 mg | ORAL_TABLET | Freq: Every day | ORAL | 0 refills | Status: AC

## 2018-04-02 NOTE — ED Triage Notes (Signed)
She was seen by her MD today for pain in her right leg and sob for 2 months. She was told to come here to see if she had a DVT.

## 2018-04-02 NOTE — Progress Notes (Signed)
Subjective:    Patient ID: Deborah Haney, female    DOB: 08/07/1965, 53 y.o.   MRN: [id number]  HPI Chief Complaint  Patient presents with  . pain on right    follow-up on protein urine. having trouble catching breath and thinks its side effect from bp meds- coughing as well   She is here to follow up on recent UTI and proteinuria however, she has new complaints of DOE, dry cough and right calf pain x 2 weeks.   She was diagnosed with acute cystitis with hematuria on 12/230/2019. She was treated and reports urinary symptoms resolved.  Apparently she had protein in her urine on that day and would like to follow up.   Intermittent "nagging" dry cough. At times she is coughing at night and at times during the day. Does not think it related to cold symptoms. No other URI symptoms. She questions whether this might be related to lisinopril.    Complains of shortness of breath with exertion for the past 2 weeks. She sits most of the time. Notices this mostly when going up the stairs or even shopping. For the past 3 days she has become short of breath even walking short distances   Right side pain for the past 2 weeks. No known injury. Pain with deep breathing.   Reports taking a long car ride to [place], approximately 5 hours but she did stop a few times. She then drove back after a couple of days later.  No recent surgery. Denies history of DVT or PE.   Denies unexplained weight loss, chest pain, palpitations, wheezing, URI symptoms, abdominal pain, N/V/D.  Questionable fever, chills.   Reviewed allergies, medications, past medical, surgical, family, and social history.   Review of Systems Pertinent positives and negatives in the history of present illness.     Objective:   Physical Exam Constitutional:      General: She is not in acute distress.    Appearance: Normal appearance.  HENT:     Nose: Nose normal.     Mouth/Throat:     Mouth: Mucous membranes are moist.   Pharynx: Oropharynx is clear.  Eyes:     Conjunctiva/sclera: Conjunctivae normal.     Pupils: Pupils are equal, round, and reactive to light.  Neck:     Musculoskeletal: Normal range of motion and neck supple.  Cardiovascular:     Rate and Rhythm: Normal rate and regular rhythm.     Pulses: Normal pulses.     Heart sounds: Normal heart sounds.     Comments: Trace right calf edema, non pitting. Right calf with TTP. No erythema, increased warmth. Pedal pulses intact. Negative Homans Pulmonary:     Effort: Pulmonary effort is normal.     Breath sounds: Normal breath sounds.     Comments: Normal work of breathing. Speaking in complete sentences  Abdominal:     General: Abdomen is flat. Bowel sounds are normal. There is no distension.     Palpations: Abdomen is soft.     Tenderness: There is abdominal tenderness in the right lower quadrant. There is no guarding or rebound. Negative signs include Rovsing's sign and psoas sign.  Skin:    General: Skin is warm and dry.     Capillary Refill: Capillary refill takes less than 2 seconds.  Neurological:     Mental Status: She is alert.     Cranial Nerves: Cranial nerves are intact.     Motor: Motor function is intact.  Coordination: Coordination is intact.  Psychiatric:        Attention and Perception: Attention normal.        Mood and Affect: Mood normal.        Speech: Speech normal.        Behavior: Behavior normal.        Thought Content: Thought content normal.    BP 120/80   Pulse 71   Temp 98.2 F (36.8 C) (Oral)   Resp 16   Wt 206 lb (93.4 kg)   LMP 04/25/2011   SpO2 98%   BMI 33.25 kg/m       Assessment & Plan:  DOE (dyspnea on exertion) - Plan: EKG 12-Lead  Proteinuria, unspecified type - Plan: POCT Urinalysis DIP (Proadvantage Device)  Persistent dry cough  Side pain  Tenderness of right calf  Urinalysis is negative today. No further work up needed for this.  ECG shows NSR with normal rate. No acute ST  changes.   Discussed that DVT/PE is on my list of differential diagnoses and I am unfortunately unable to get stat labs or send her for venous dopplers or even an XR this late in the day. I recommend that she go to the ED for further evaluation to rule out DVT/PE due to a 2 week history of worsening DOE, recent long car ride. Does not appear to have an acute infectious process. She is not in any acute distress. She is safe to go by private car.  Unclear if cough is related to lisinopril or not. We will follow up on this after her work up.

## 2018-04-02 NOTE — Discharge Instructions (Addendum)
All your blood work today was within normal limits. I have prescribed antibiotics to help with your symptoms, please two tablets daily for the next 3 days. Please follow up with your primary care physician after completion of therapy.

## 2018-04-02 NOTE — Patient Instructions (Signed)
Go to the emergency department for further work up for shortness of breath with exertion, right calf pain and swelling.

## 2018-04-02 NOTE — ED Provider Notes (Addendum)
[facility] EMERGENCY DEPARTMENT Provider Note   CSN: [id number] Arrival date & time: 04/02/18  1717     History   Chief Complaint Chief Complaint  Patient presents with  . Leg Pain  . Shortness of Breath    HPI Deborah Haney is a 53 y.o. female.  53 y.o female with a PMH of Anemia, GERD, DM presents to the ED with a chief complaint of shortness of breath and right leg swelling x 3 weeks.  She reports short of breath for the past 3 weeks but worsening recently, stating she has a hard time with deep inspiration.  Also reports she is had swelling to her right leg but is unsure of when this began.  Reports she was in bed all weekend as she felt unwell, states that she missed church because she felt like there was something going on.  Was seen by PCP today who advised patient be seen in the ED to rule out a DVT.  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  Denies any previous history of blood clots, use of estrogen, fever, chest pain.     Past Medical History:  Diagnosis Date  . Acute cholecystitis 07/14/2013  . Anemia    has history, takes iron  . Anxiety   . Bipolar affective disorder ([place]) 10/26/2015   Has seen counselor, declined medications per medical record from [facility]. hypermanic  . Chest pain 07/14/2013  . Chronic vaginitis 10/26/2015   Has h/o vag dryness and tried Estrace per [place] records  . Depression   . Diabetes ([place])   . Diverticulitis    no problems  . Dysfunctional uterine bleeding 03/02/2011  . Elevated hemoglobin A1c 10/26/2015  . GERD (gastroesophageal reflux disease)    tums prn  . Headache(784.0)   . Hemorrhoids   . HTN (hypertension)    BP meds since fall 2016  . Internal hemorrhoids   . Nausea & vomiting 07/14/2013  . Obesity 10/26/2015  . Rectal bleeding 03/15/2017     Patient Active Problem List   Diagnosis Date Noted  . Acute cystitis with hematuria 03/05/2018  . Dyslipidemia 06/05/2017  . Diabetes ([place])   . Hemorrhoids 03/15/2017  . Rectal bleeding 03/15/2017  . HTN (hypertension) 10/26/2015  . Obesity 10/26/2015  . Chronic vaginitis 10/26/2015  . Bipolar affective disorder ([place]) 10/26/2015  . Nausea & vomiting 07/14/2013  . Acute cholecystitis 07/14/2013  . Chest pain 07/14/2013  . Dysfunctional uterine bleeding 03/02/2011    Past Surgical History:  Procedure Laterality Date  . CHOLECYSTECTOMY N/A 07/15/2013   Procedure: LAPAROSCOPIC CHOLECYSTECTOMY;  Surgeon: Rolm Bookbinder, MD;  Location: [place];  Service: General;  Laterality: N/A;  . COLONOSCOPY    . ECTOPIC PREGNANCY SURGERY     laparotomy  . ROBOTIC ASSISTED LAP VAGINAL HYSTERECTOMY       OB History    Gravida  1   Para  0   Term  0   Preterm  0   AB  1   Living  0     SAB  0   TAB  0   Ectopic  1   Multiple  0   Live Births               Home Medications    Prior to Admission  medications   Medication Sig Start Date End Date Taking? Authorizing Provider  lisinopril-hydrochlorothiazide (PRINZIDE,ZESTORETIC) 10-12.5 MG tablet TAKE 1 TABLET BY MOUTH ONCE DAILY 03/30/18  Yes Henson, Vickie L, NP-C  mupirocin ointment (BACTROBAN) 2 % Apply thin layer to affected area 3 times daily x 7 days. 01/29/18  Yes Henson, Vickie L, NP-C  azithromycin (ZITHROMAX) 250 MG tablet Take 2 tablets (500 mg total) by mouth daily for 3 days. Take first 2 tablets together, then 1 every day until finished. 04/02/18 04/05/18  Janeece Fitting, PA-C    Family History Family History  Problem Relation Age of Onset  . Diabetes Mother   . Hypertension Mother   . Arthritis Father   . Hypertension Brother   . Cancer Brother        "in his blood"  . Diabetes Brother        diet controlled  . Cancer Brother        unsure kind  . CVA Other   . Colon cancer Neg Hx   . Colon  polyps Neg Hx   . Esophageal cancer Neg Hx   . Rectal cancer Neg Hx   . Stomach cancer Neg Hx     Social History Social History   Tobacco Use  . Smoking status: Former Smoker    Types: Cigarettes    Last attempt to quit: 04/18/2008    Years since quitting: 9.9  . Smokeless tobacco: Never Used  Substance Use Topics  . Alcohol use: Yes    Comment: 1 glass of wine rarely, occasionally  . Drug use: No    Types: Cocaine    Comment: Former cocaine use since 2012     Allergies   Patient has no known allergies.   Review of Systems Review of Systems  Constitutional: Negative for chills and fever.  HENT: Negative for ear pain and sore throat.   Eyes: Negative for pain and visual disturbance.  Respiratory: Positive for shortness of breath. Negative for cough.   Cardiovascular: Positive for leg swelling. Negative for chest pain and palpitations.  Gastrointestinal: Negative for abdominal pain and vomiting.  Genitourinary: Negative for dysuria and hematuria.  Musculoskeletal: Negative for arthralgias and back pain.  Skin: Negative for color change and rash.  Neurological: Negative for seizures and syncope.  All other systems reviewed and are negative.    Physical Exam Updated Vital Signs BP 134/90 (BP Location: Right Arm)   Pulse 74   Temp 97.6 F (36.4 C) (Oral)   Resp 16   Ht 5\' 6"  (1.676 m)   LMP 04/25/2011   SpO2 98%   BMI 33.25 kg/m   Physical Exam Vitals signs and nursing note reviewed.  Constitutional:      General: She is not in acute distress.    Appearance: She is well-developed.  HENT:     Head: Normocephalic and atraumatic.     Mouth/Throat:     Pharynx: No oropharyngeal exudate.  Eyes:     Pupils: Pupils are equal, round, and reactive to light.  Neck:     Musculoskeletal: Normal range of motion.  Cardiovascular:     Rate and Rhythm: Regular rhythm.     Pulses:          Posterior tibial pulses are 2+ on the right side and 2+ on the left side.      Heart sounds: Normal heart sounds.  Pulmonary:     Effort: Pulmonary effort is normal. No respiratory distress.     Breath  sounds: Examination of the right-lower field reveals decreased breath sounds. Examination of the left-lower field reveals decreased breath sounds. Decreased breath sounds present. No wheezing.     Comments: Swallow breathing during inspiration. Diminished breath sounds on lower fields.  Abdominal:     General: Bowel sounds are normal. There is no distension.     Palpations: Abdomen is soft.     Tenderness: There is no abdominal tenderness.  Musculoskeletal:        General: No deformity.     Right lower leg: She exhibits tenderness. No edema.     Left lower leg: No edema.       Legs:  Skin:    General: Skin is warm and dry.  Neurological:     Mental Status: She is alert and oriented to person, place, and time.     Comments: RLE- KF,KE 5/5 strength LLE- HF, HE 5/5 strength Normal gait. No pronator drift. No leg drop.  Patellar reflexes present and symmetric. CN I, II and VIII not tested. CN II-XII grossly intact bilaterally.         ED Treatments / Results  Labs (all labs ordered are listed, but only abnormal results are displayed) Labs Reviewed  COMPREHENSIVE METABOLIC PANEL - Abnormal; Notable for the following components:      Result Value   Potassium 3.3 (*)    Glucose, Bld 111 (*)    All other components within normal limits  CBC WITH DIFFERENTIAL/PLATELET  TROPONIN I    EKG EKG Interpretation  Date/Time:  Monday April 02 2018 21:19:51 EST Ventricular Rate:  68 PR Interval:    QRS Duration: 105 QT Interval:  409 QTC Calculation: 435 R Axis:   71 Text Interpretation:  Sinus rhythm Prolonged PR interval Probable left ventricular hypertrophy Confirmed by Lennice Sites [phone]) on 04/02/2018 9:24:30 PM   Radiology Dg Chest 2 View  Result Date: 04/02/2018 CLINICAL DATA:  Shortness of breath, central chest pain, right leg pain. EXAM: CHEST  - 2 VIEW COMPARISON:  Radiograph 04/17/2017 FINDINGS: The cardiomediastinal contours are normal, heart size upper normal. Mild peribronchial thickening, new from prior. Minor subsegmental atelectasis or scarring at the bases. Pulmonary vasculature is normal. No consolidation, pleural effusion, or pneumothorax. No acute osseous abnormalities are seen. IMPRESSION: Mild peribronchial thickening, may reflect bronchitis or asthma. Minor subsegmental atelectasis or scarring at the bases. Electronically Signed   By: Keith Rake M.D.   On: 04/02/2018 19:02   US Venous Img Lower Unilateral Right  Result Date: 04/02/2018 CLINICAL DATA:  Pain, swelling in right lower extremity EXAM: RIGHT LOWER EXTREMITY VENOUS DOPPLER ULTRASOUND TECHNIQUE: Gray-scale sonography with graded compression, as well as color Doppler and duplex ultrasound were performed to evaluate the lower extremity deep venous systems from the level of the common femoral vein and including the common femoral, femoral, profunda femoral, popliteal and calf veins including the posterior tibial, peroneal and gastrocnemius veins when visible. The superficial great saphenous vein was also interrogated. Spectral Doppler was utilized to evaluate flow at rest and with distal augmentation maneuvers in the common femoral, femoral and popliteal veins. COMPARISON:  None. FINDINGS: Contralateral Common Femoral Vein: Respiratory phasicity is normal and symmetric with the symptomatic side. No evidence of thrombus. Normal compressibility. Common Femoral Vein: No evidence of thrombus. Normal compressibility, respiratory phasicity and response to augmentation. Saphenofemoral Junction: No evidence of thrombus. Normal compressibility and flow on color Doppler imaging. Profunda Femoral Vein: No evidence of thrombus. Normal compressibility and flow on color Doppler imaging. Femoral  Vein: No evidence of thrombus. Normal compressibility, respiratory phasicity and response to  augmentation. Popliteal Vein: No evidence of thrombus. Normal compressibility, respiratory phasicity and response to augmentation. Calf Veins: No evidence of thrombus. Normal compressibility and flow on color Doppler imaging. Superficial Great Saphenous Vein: No evidence of thrombus. Normal compressibility. Venous Reflux:  None. Other Findings:  None. IMPRESSION: No evidence of deep venous thrombosis. Electronically Signed   By: Rolm Baptise M.D.   On: 04/02/2018 20:27    Procedures Procedures (including critical care time)  Medications Ordered in ED Medications - No data to display   Initial Impression / Assessment and Plan / ED Course  I have reviewed the triage vital signs and the nursing notes.  Pertinent labs & imaging results that were available during my care of the patient were reviewed by me and considered in my medical decision making (see chart for details).    Resents with shortness of breath times a few weeks, seen by PCP today and sent to ED for further evaluation to rule out DVT.  Patient has no risk factors, most recent travel was 5 hours.  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  Evaluation no swelling is noted to the right leg, she is however reporting tenderness along the right calf.  Sound of the right lower extremity was obtained which showed no changes consistent with a deep venous thrombosis.  Chest x-ray was also obtained to rule out any abnormality as patient reports shortness of breath for the past 2 months, she is currently satting at 99% with a stable heart rate.  Chest 2 view showed: Mild peribronchial thickening, may reflect bronchitis or asthma.  Minor subsegmental atelectasis or scarring at the bases.    EKG obtained although patient denies any chest pain during evaluation. CBC and CMP were unremarkable.  Slight Elevation of white blood cell count, CMP showed no electrolyte abnormality, slight decrease in potassium,  creatinine stable. Has been dealing with the symptoms for the past 3 weeks, will treat with Z-Pak due to her length and symptoms.  Low suspicion for pulmonary embolism as patient not tachycardic, no hypoxia, no risk factors at this time.  Have her follow-up with PCP for completion of Z-Pak for improvement in symptoms.  Understands and agrees with treatment, return precautions provided at length.  Patient's vitals stable for discharge afebrile.  Patient stable for discharge.   Final Clinical Impressions(s) / ED Diagnoses   Final diagnoses:  Bronchitis    ED Discharge Orders         Ordered    azithromycin (ZITHROMAX) 250 MG tablet  Daily     04/02/18 2059           Janeece Fitting, PA-C 04/02/18 2134    Janeece Fitting, PA-C 04/02/18 2333    Lennice Sites, DO 04/03/18 0101

## 2018-04-03 NOTE — Addendum Note (Signed)
Addended by: Minette Headland A on: 04/03/2018 09:20 AM   Modules accepted: Orders

## 2018-04-05 ENCOUNTER — Other Ambulatory Visit: Payer: BLUE CROSS/BLUE SHIELD

## 2018-04-05 LAB — TB SKIN TEST
Induration: 0 mm
TB Skin Test: NEGATIVE

## 2018-05-22 ENCOUNTER — Encounter: Payer: Self-pay | Admitting: Family Medicine

## 2018-05-22 ENCOUNTER — Telehealth: Payer: Self-pay

## 2018-05-22 NOTE — Telephone Encounter (Signed)
Spoke to pt about being tested for Covid-19. Pt advised she has had chest pain, runny nose, dry cough and not sure if she has been in contact with infected person. Pt has been in large groups of people. Pt is not sure if fever is present and will call back to advise of any other symptoms and or fever is present. Pt symptoms started Sunday and has worsen. Awaiting a call back . [place]

## 2018-06-11 ENCOUNTER — Encounter: Payer: Self-pay | Admitting: Family Medicine

## 2018-06-12 ENCOUNTER — Encounter: Payer: Self-pay | Admitting: Medical

## 2018-06-12 ENCOUNTER — Other Ambulatory Visit: Payer: Self-pay

## 2018-06-12 ENCOUNTER — Ambulatory Visit (INDEPENDENT_AMBULATORY_CARE_PROVIDER_SITE_OTHER): Payer: BLUE CROSS/BLUE SHIELD | Admitting: Medical

## 2018-06-12 VITALS — BP 130/87 | HR 70 | Temp 96.9°F | Ht 66.0 in | Wt 210.0 lb

## 2018-06-12 DIAGNOSIS — R52 Pain, unspecified: Secondary | ICD-10-CM | POA: Diagnosis not present

## 2018-06-12 DIAGNOSIS — R051 Acute cough: Secondary | ICD-10-CM | POA: Insufficient documentation

## 2018-06-12 DIAGNOSIS — J301 Allergic rhinitis due to pollen: Secondary | ICD-10-CM

## 2018-06-12 DIAGNOSIS — R0602 Shortness of breath: Secondary | ICD-10-CM | POA: Diagnosis not present

## 2018-06-12 DIAGNOSIS — R05 Cough: Secondary | ICD-10-CM | POA: Diagnosis not present

## 2018-06-12 DIAGNOSIS — R059 Cough, unspecified: Secondary | ICD-10-CM

## 2018-06-12 MED ORDER — BENZONATATE 200 MG PO CAPS: 200.0000 mg | ORAL_CAPSULE | Freq: Three times a day (TID) | ORAL | 0 refills | Status: DC | PRN

## 2018-06-12 NOTE — Progress Notes (Signed)
Subjective:     Patient ID: Deborah Haney, female   DOB: 1965/07/30, 53 y.o.   MRN: [id number]  Documentation for virtual audio and video telecommunications through [place] encounter:  The patient was located at home. The provider was located in the office. The patient did consent to this visit and is aware of possible charges through their insurance for this visit.  The other persons participating in this telemedicine service were none. Time spent on call was 20 minutes and in review of previous records >25 minutes total.  This virtual service is not related to other E/M service within previous 7 days.   HPI Chief Complaint  Patient presents with  . sore throat    sore throat, weakness, headache, sob X 3 days  leg pain   Virtual visit today for illness.    She notes headache, sore throat, not feeling well, no energy despite sleep.   Tired.  Having some SOB.  Started with these symptoms 3 days ago.  Has hx/o headaches ongoing, some sore throat for weeks though.   She had allergies this past spring, but in the past hasn't had allergies.   The SOB is at rest or with activity the last few days.   No chest pain, no leg edema.   No hx/o heart failure.  Has occasional cough.  Yes some itchy eyes.  Lots of drainage.  No hx/o asthma. Nonsmoker.  She does recall having some body aches this past weekend when the symptoms started. No chills, no fever. Felt terrible this past Saturday, body , legs .  +sick contacts.  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  Has gloves but only has a few masks.  There have been sick people in the homeless shelter.   Past Medical History:  Diagnosis Date  . Acute cholecystitis 07/14/2013  . Anemia    has history, takes iron  . Anxiety   . Bipolar affective disorder ([place]) 10/26/2015   Has seen counselor, declined medications per medical record from [facility]. hypermanic  . Chest pain 07/14/2013  . Chronic vaginitis 10/26/2015   Has h/o vag dryness and tried Estrace  per [place] records  . Depression   . Diabetes ([place])   . Diverticulitis    no problems  . Dysfunctional uterine bleeding 03/02/2011  . Elevated hemoglobin A1c 10/26/2015  . GERD (gastroesophageal reflux disease)    tums prn  . Headache(784.0)   . Hemorrhoids   . HTN (hypertension)    BP meds since fall 2016  . Internal hemorrhoids   . Nausea & vomiting 07/14/2013  . Obesity 10/26/2015  . Rectal bleeding 03/15/2017   Current Outpatient Medications on File Prior to Visit  Medication Sig Dispense Refill  . lisinopril-hydrochlorothiazide (PRINZIDE,ZESTORETIC) 10-12.5 MG tablet TAKE 1 TABLET BY MOUTH ONCE DAILY 30 tablet 2  . mupirocin ointment (BACTROBAN) 2 % Apply thin layer to affected area 3 times daily x 7 days. 22 g 0   No current facility-administered medications on file prior to visit.     Review of Systems As in subjective    Objective:   Physical Exam Due to coronavirus pandemic stay at home measures, patient visit was virtual and they were not examined in person.    BP 130/87   Pulse 70   Temp (!) 96.9 F (36.1 C) (Oral)   Ht 5\' 6"  (1.676 m)   Wt 210 lb (95.3 kg)   LMP 04/25/2011   BMI 33.89 kg/m   General: No acute distress, no  coughing during our conversation, does not sound particularly ill during the virtual visit.      Assessment:     Encounter Diagnoses  Name Primary?  . Cough Yes  . SOB (shortness of breath)   . Allergic rhinitis due to pollen, unspecified seasonality   . Body aches        Plan:     We discussed her symptoms and concerns.  It sounds like she has underlying allergies that started with the pollen a few weeks ago given the headache and congestion but then over the past 3 days systolic she may have had a mild respiratory tract infection with the body aches and fatigue and headache and congestion although not much cough.  At this point I recommended she treat her symptoms as a cold along with underlying allergic rhinitis.  I advise she  continue the Claritin she just started, she can use the [employer] as below, rest, hydrate well, but advised self quarantine in the off chance that the symptoms could be early signs of the COVID-19.  I will write her a note out this week from work.  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  I advise she talk with her supervisor today about having some other plans to reduce or minimize exposure to COVID-19.  Otherwise the whole facility is at risk for quick spread of this disease.  We talked at length about this.  We will email her a work note.  Follow-up PRN she voiced understanding and agreement of plan   Lanyla was seen today for sore throat.  Diagnoses and all orders for this visit:  Cough  SOB (shortness of breath)  Allergic rhinitis due to pollen, unspecified seasonality  Body aches  Other orders -     benzonatate (TESSALON) 200 MG capsule; Take 1 capsule (200 mg total) by mouth 3 (three) times daily as needed for cough.

## 2018-06-12 NOTE — Progress Notes (Signed)
done

## 2018-06-22 ENCOUNTER — Encounter: Payer: Self-pay | Admitting: Gastroenterology

## 2018-06-30 ENCOUNTER — Other Ambulatory Visit: Payer: Self-pay | Admitting: Family Medicine

## 2018-06-30 DIAGNOSIS — E119 Type 2 diabetes mellitus without complications: Secondary | ICD-10-CM

## 2018-06-30 DIAGNOSIS — I10 Essential (primary) hypertension: Secondary | ICD-10-CM

## 2018-07-02 ENCOUNTER — Encounter: Payer: Self-pay | Admitting: Family Medicine

## 2018-07-02 ENCOUNTER — Other Ambulatory Visit: Payer: Self-pay

## 2018-07-02 ENCOUNTER — Ambulatory Visit (INDEPENDENT_AMBULATORY_CARE_PROVIDER_SITE_OTHER): Payer: BLUE CROSS/BLUE SHIELD | Admitting: Family Medicine

## 2018-07-02 VITALS — BP 142/82 | HR 66 | Temp 96.5°F | Resp 16 | Wt 210.0 lb

## 2018-07-02 DIAGNOSIS — E119 Type 2 diabetes mellitus without complications: Secondary | ICD-10-CM | POA: Diagnosis not present

## 2018-07-02 DIAGNOSIS — I10 Essential (primary) hypertension: Secondary | ICD-10-CM | POA: Diagnosis not present

## 2018-07-02 MED ORDER — MICROLET LANCETS MISC: 1 refills | Status: DC

## 2018-07-02 MED ORDER — GLUCOSE BLOOD VI STRP: ORAL_STRIP | 1 refills | Status: DC

## 2018-07-02 MED ORDER — CONTOUR MONITOR W/DEVICE KIT: 1.0000 | PACK | Freq: Two times a day (BID) | 0 refills | Status: AC

## 2018-07-02 MED ORDER — LISINOPRIL-HYDROCHLOROTHIAZIDE 10-12.5 MG PO TABS: 1.0000 | ORAL_TABLET | Freq: Every day | ORAL | 4 refills | Status: DC

## 2018-07-02 NOTE — Progress Notes (Signed)
   Subjective:   Documentation for virtual audio and video telecommunications through [facility] encounter:  The patient was located at home. 2 patient identifiers used.  The provider was located in the office. The patient did consent to this visit and is aware of possible charges through their insurance for this visit.  The other persons participating in this telemedicine service were none.    Patient ID: Deborah Haney, female    DOB: January 04, 1966, 53 y.o.   MRN: [id number]  HPI Chief Complaint  Patient presents with  . med check   Follow up on HTN and prediabetes.  Approximately 3 weeks ago she was having URI symptoms and she reports all of those symptoms resolved.  Reports feeling back to normal.  HTN- reports good daily compliance with her medication and no side effects. Does not check her BP often.   Reports taking Lisinopril- HCTZ daily.  Her last hemoglobin A1c was 6.4%.  She has not been checking her blood sugars.  She has not been on medication for this either.  She requests a meter and testing supplies to start checking her blood sugar at home.  Denies polyuria, polydipsia, vision changes or unexplained weight loss.  Diet - has been fairly unhealthy for the past month. Starting back on healthy regimen. Has been eating more sweets.  Exercise- using her Fit bit again and started recently. Doing 50,000 steps per week.   Other providers:  Dr. Hilarie Fredrickson- GI  Dr. Bettina Gavia- cardiologist   Denies fever, chills, dizziness, chest pain, palpitations, shortness of breath, abdominal pain, N/V/D, urinary symptoms, LE edema.    Reviewed allergies, medications, past medical, surgical, family, and social history.    Review of Systems Pertinent positives and negatives in the history of present illness.     Objective:   Physical Exam BP (!) 142/82   Pulse 66   Temp (!) 96.5 F (35.8 C) (Oral)   Resp 16   Wt 210 lb (95.3 kg)   LMP 04/25/2011   BMI 33.89 kg/m   Alert and oriented and  in no acute distress.  Speaking in complete sentences without difficulty.  Normal speech, mood and thought process.      Assessment & Plan:  Essential hypertension - Plan: lisinopril-hydrochlorothiazide (ZESTORETIC) 10-12.5 MG tablet  Controlled type 2 diabetes mellitus without complication, without long-term current use of insulin (HCC) - Plan: lisinopril-hydrochlorothiazide (ZESTORETIC) 10-12.5 MG tablet  She is in her usual state of health.  Reviewed recent labs and office notes.  Symptoms she was having 3 weeks ago have completely resolved. BP close to goal range. Continue on current medication.  Counseling done on healthy diet and exercise to help control blood pressure and blood sugar. DASH diet handout will be mailed to patient. She requests a meter and supplies to start checking her blood sugars.  I think this is a great idea.  Previous 2 hemoglobin A1c's have been in goal range at 6.3% and 6.4%.  Counseling on cutting back on sweets and carbohydrates. Encouraged her to keep a record of her blood pressures and blood sugars and if she is not seeing them in goal range consistently then follow-up with me sooner than August.  Otherwise she will return in August for fasting CPE and follow-up on chronic health conditions.  Time spent on call was 20 minutes and in review of previous records 4 minutes total.  This virtual service is not related to other E/M service within previous 7 days.

## 2018-07-02 NOTE — Patient Instructions (Signed)
Goal blood pressure readings are less than 130/80.  Keep an eye on your blood pressure at home and if you see readings consistently higher than this, let me know. See the Dash eating plan below to help you with better eating habits to help with your blood pressure. Continue on the lisinopril/HCTZ.  Start checking your blood sugars daily or at least 2-3 times per week.  Keep a record of these readings.  Bring them into your next visit. Goal fasting blood sugars are 80-120. Goal blood sugars for 2 hours after eating are 1 30-160.  Make sure you are limiting your sweets, carbohydrates such as potatoes, rice, pasta, bread. Try to get at least 7000 steps per day if not 10,000.  Return in August for a fasting physical exam and to follow-up on chronic health conditions.  Let me know sooner if your blood sugars or blood pressures are higher than goal.   DASH Eating Plan DASH stands for "Dietary Approaches to Stop Hypertension." The DASH eating plan is a healthy eating plan that has been shown to reduce high blood pressure (hypertension). It may also reduce your risk for type 2 diabetes, heart disease, and stroke. The DASH eating plan may also help with weight loss. What are tips for following this plan?  General guidelines  Avoid eating more than 2,300 mg (milligrams) of salt (sodium) a day. If you have hypertension, you may need to reduce your sodium intake to 1,500 mg a day.  Limit alcohol intake to no more than 1 drink a day for nonpregnant women and 2 drinks a day for men. One drink equals 12 oz of beer, 5 oz of wine, or 1 oz of hard liquor.  Work with your health care provider to maintain a healthy body weight or to lose weight. Ask what an ideal weight is for you.  Get at least 30 minutes of exercise that causes your heart to beat faster (aerobic exercise) most days of the week. Activities may include walking, swimming, or biking.  Work with your health care provider or diet and  nutrition specialist (dietitian) to adjust your eating plan to your individual calorie needs. Reading food labels   Check food labels for the amount of sodium per serving. Choose foods with less than 5 percent of the Daily Value of sodium. Generally, foods with less than 300 mg of sodium per serving fit into this eating plan.  To find whole grains, look for the word "whole" as the first word in the ingredient list. Shopping  Buy products labeled as "low-sodium" or "no salt added."  Buy fresh foods. Avoid canned foods and premade or frozen meals. Cooking  Avoid adding salt when cooking. Use salt-free seasonings or herbs instead of table salt or sea salt. Check with your health care provider or pharmacist before using salt substitutes.  Do not fry foods. Cook foods using healthy methods such as baking, boiling, grilling, and broiling instead.  Cook with heart-healthy oils, such as olive, canola, soybean, or sunflower oil. Meal planning  Eat a balanced diet that includes: ? 5 or more servings of fruits and vegetables each day. At each meal, try to fill half of your plate with fruits and vegetables. ? Up to 6-8 servings of whole grains each day. ? Less than 6 oz of lean meat, poultry, or fish each day. A 3-oz serving of meat is about the same size as a deck of cards. One egg equals 1 oz. ? 2 servings of low-fat dairy  each day. ? A serving of nuts, seeds, or beans 5 times each week. ? Heart-healthy fats. Healthy fats called Omega-3 fatty acids are found in foods such as flaxseeds and coldwater fish, like sardines, salmon, and mackerel.  Limit how much you eat of the following: ? Canned or prepackaged foods. ? Food that is high in trans fat, such as fried foods. ? Food that is high in saturated fat, such as fatty meat. ? Sweets, desserts, sugary drinks, and other foods with added sugar. ? Full-fat dairy products.  Do not salt foods before eating.  Try to eat at least 2 vegetarian  meals each week.  Eat more home-cooked food and less restaurant, buffet, and fast food.  When eating at a restaurant, ask that your food be prepared with less salt or no salt, if possible. What foods are recommended? The items listed may not be a complete list. Talk with your dietitian about what dietary choices are best for you. Grains Whole-grain or whole-wheat bread. Whole-grain or whole-wheat pasta. Brown rice. [place]. Bulgur. Whole-grain and low-sodium cereals. Pita bread. Low-fat, low-sodium crackers. Whole-wheat flour tortillas. Vegetables Fresh or frozen vegetables (raw, steamed, roasted, or grilled). Low-sodium or reduced-sodium tomato and vegetable juice. Low-sodium or reduced-sodium tomato sauce and tomato paste. Low-sodium or reduced-sodium canned vegetables. Fruits All fresh, dried, or frozen fruit. Canned fruit in natural juice (without added sugar). Meat and other protein foods Skinless chicken or [place]. Ground chicken or [place]. Pork with fat trimmed off. Fish and seafood. Egg whites. Dried beans, peas, or lentils. Unsalted nuts, nut butters, and seeds. Unsalted canned beans. Lean cuts of beef with fat trimmed off. Low-sodium, lean deli meat. Dairy Low-fat (1%) or fat-free (skim) milk. Fat-free, low-fat, or reduced-fat cheeses. Nonfat, low-sodium ricotta or cottage cheese. Low-fat or nonfat yogurt. Low-fat, low-sodium cheese. Fats and oils Soft margarine without trans fats. Vegetable oil. Low-fat, reduced-fat, or light mayonnaise and salad dressings (reduced-sodium). Canola, safflower, olive, soybean, and sunflower oils. Avocado. Seasoning and other foods Herbs. Spices. Seasoning mixes without salt. Unsalted popcorn and pretzels. Fat-free sweets. What foods are not recommended? The items listed may not be a complete list. Talk with your dietitian about what dietary choices are best for you. Grains Baked goods made with fat, such as croissants, muffins, or some  breads. Dry pasta or rice meal packs. Vegetables Creamed or fried vegetables. Vegetables in a cheese sauce. Regular canned vegetables (not low-sodium or reduced-sodium). Regular canned tomato sauce and paste (not low-sodium or reduced-sodium). Regular tomato and vegetable juice (not low-sodium or reduced-sodium). Angie Fava. Olives. Fruits Canned fruit in a light or heavy syrup. Fried fruit. Fruit in cream or butter sauce. Meat and other protein foods Fatty cuts of meat. Ribs. Fried meat. Berniece Salines. Sausage. Bologna and other processed lunch meats. Salami. Fatback. Hotdogs. Bratwurst. Salted nuts and seeds. Canned beans with added salt. Canned or smoked fish. Whole eggs or egg yolks. Chicken or [place] with skin. Dairy Whole or 2% milk, cream, and half-and-half. Whole or full-fat cream cheese. Whole-fat or sweetened yogurt. Full-fat cheese. Nondairy creamers. Whipped toppings. Processed cheese and cheese spreads. Fats and oils Butter. Stick margarine. Lard. Shortening. Ghee. Bacon fat. Tropical oils, such as coconut, palm kernel, or palm oil. Seasoning and other foods Salted popcorn and pretzels. Onion salt, garlic salt, seasoned salt, table salt, and sea salt. Worcestershire sauce. Tartar sauce. Barbecue sauce. Teriyaki sauce. Soy sauce, including reduced-sodium. Steak sauce. Canned and packaged gravies. Fish sauce. Oyster sauce. Cocktail sauce. Horseradish that you find on  the shelf. Ketchup. Mustard. Meat flavorings and tenderizers. Bouillon cubes. Hot sauce and Tabasco sauce. Premade or packaged marinades. Premade or packaged taco seasonings. Relishes. Regular salad dressings. Where to find more information:  National Heart, Lung, and [place]: https://wilson-eaton.com/  American Heart Association: www.heart.org Summary  The DASH eating plan is a healthy eating plan that has been shown to reduce high blood pressure (hypertension). It may also reduce your risk for type 2 diabetes, heart disease, and  stroke.  With the DASH eating plan, you should limit salt (sodium) intake to 2,300 mg a day. If you have hypertension, you may need to reduce your sodium intake to 1,500 mg a day.  When on the DASH eating plan, aim to eat more fresh fruits and vegetables, whole grains, lean proteins, low-fat dairy, and heart-healthy fats.  Work with your health care provider or diet and nutrition specialist (dietitian) to adjust your eating plan to your individual calorie needs. This information is not intended to replace advice given to you by your health care provider. Make sure you discuss any questions you have with your health care provider. Document Released: 02/10/2011 Document Revised: 02/15/2016 Document Reviewed: 02/15/2016 Elsevier Interactive Patient Education  2019 [employer].

## 2018-07-02 NOTE — Addendum Note (Signed)
Addended by: Minette Headland A on: 07/02/2018 01:09 PM   Modules accepted: Orders

## 2018-08-13 ENCOUNTER — Other Ambulatory Visit: Payer: Self-pay | Admitting: *Deleted

## 2018-08-13 DIAGNOSIS — Z20822 Contact with and (suspected) exposure to covid-19: Secondary | ICD-10-CM

## 2018-08-16 LAB — NOVEL CORONAVIRUS, NAA: SARS-CoV-2, NAA: NOT DETECTED

## 2018-08-20 ENCOUNTER — Encounter: Payer: Self-pay | Admitting: Family Medicine

## 2018-08-21 ENCOUNTER — Other Ambulatory Visit: Payer: BLUE CROSS/BLUE SHIELD

## 2018-08-21 ENCOUNTER — Telehealth: Payer: Self-pay

## 2018-08-21 DIAGNOSIS — Z20822 Contact with and (suspected) exposure to covid-19: Secondary | ICD-10-CM

## 2018-08-21 DIAGNOSIS — R6889 Other general symptoms and signs: Secondary | ICD-10-CM | POA: Diagnosis not present

## 2018-08-21 NOTE — Telephone Encounter (Signed)
Patient referred for Covid Testing by Harland Dingwall NP  Telephone call to Pt.  scheduled for testing  Later today @6 /16/16 @12  noon. Patient voices understanding.

## 2018-08-22 LAB — NOVEL CORONAVIRUS, NAA: SARS-CoV-2, NAA: DETECTED — AB

## 2018-08-23 ENCOUNTER — Encounter (INDEPENDENT_AMBULATORY_CARE_PROVIDER_SITE_OTHER): Payer: Self-pay

## 2018-08-23 ENCOUNTER — Other Ambulatory Visit: Payer: Self-pay | Admitting: Family Medicine

## 2018-08-23 ENCOUNTER — Telehealth: Payer: Self-pay | Admitting: Internal Medicine

## 2018-08-23 DIAGNOSIS — U071 COVID-19: Secondary | ICD-10-CM

## 2018-08-23 NOTE — Telephone Encounter (Signed)
Pt was advised of covid- 19 test positive and to self quarantine for the next 14 days- if any symptoms gets worse like SOB, then go to ER to be seen.

## 2018-08-24 ENCOUNTER — Emergency Department (HOSPITAL_COMMUNITY): Payer: BC Managed Care – PPO

## 2018-08-24 ENCOUNTER — Other Ambulatory Visit: Payer: Self-pay

## 2018-08-24 ENCOUNTER — Encounter (HOSPITAL_COMMUNITY): Payer: Self-pay | Admitting: Emergency Medicine

## 2018-08-24 ENCOUNTER — Emergency Department (HOSPITAL_COMMUNITY)
Admission: EM | Admit: 2018-08-24 | Discharge: 2018-08-24 | Disposition: A | Discharge status: Home / Self Care | Payer: BC Managed Care – PPO | Attending: Emergency Medicine | Admitting: Emergency Medicine

## 2018-08-24 ENCOUNTER — Telehealth: Payer: Self-pay | Admitting: *Deleted

## 2018-08-24 ENCOUNTER — Encounter (INDEPENDENT_AMBULATORY_CARE_PROVIDER_SITE_OTHER): Payer: Self-pay

## 2018-08-24 DIAGNOSIS — Z87891 Personal history of nicotine dependence: Secondary | ICD-10-CM | POA: Diagnosis not present

## 2018-08-24 DIAGNOSIS — I10 Essential (primary) hypertension: Secondary | ICD-10-CM | POA: Diagnosis not present

## 2018-08-24 DIAGNOSIS — R0602 Shortness of breath: Secondary | ICD-10-CM | POA: Diagnosis not present

## 2018-08-24 DIAGNOSIS — R0902 Hypoxemia: Secondary | ICD-10-CM

## 2018-08-24 DIAGNOSIS — Z79899 Other long term (current) drug therapy: Secondary | ICD-10-CM | POA: Diagnosis not present

## 2018-08-24 DIAGNOSIS — U071 COVID-19: Secondary | ICD-10-CM | POA: Diagnosis not present

## 2018-08-24 DIAGNOSIS — E119 Type 2 diabetes mellitus without complications: Secondary | ICD-10-CM | POA: Diagnosis not present

## 2018-08-24 HISTORY — DX: COVID-19: U07.1

## 2018-08-24 LAB — URINALYSIS, ROUTINE W REFLEX MICROSCOPIC
Bilirubin Urine: NEGATIVE
Glucose, UA: NEGATIVE mg/dL
Hgb urine dipstick: NEGATIVE
Ketones, ur: 5 mg/dL — AB
Leukocytes,Ua: NEGATIVE
Nitrite: NEGATIVE
Protein, ur: NEGATIVE mg/dL
Specific Gravity, Urine: 1.011 (ref 1.005–1.030)
pH: 5 (ref 5.0–8.0)

## 2018-08-24 LAB — CBG MONITORING, ED: Glucose-Capillary: 110 mg/dL — ABNORMAL HIGH (ref 70–99)

## 2018-08-24 LAB — CBC WITH DIFFERENTIAL/PLATELET
Abs Immature Granulocytes: 0.01 10*3/uL (ref 0.00–0.07)
Basophils Absolute: 0 10*3/uL (ref 0.0–0.1)
Basophils Relative: 0 %
Eosinophils Absolute: 0 10*3/uL (ref 0.0–0.5)
Eosinophils Relative: 0 %
HCT: 41 % (ref 36.0–46.0)
Hemoglobin: 13 g/dL (ref 12.0–15.0)
Immature Granulocytes: 0 %
Lymphocytes Relative: 40 %
Lymphs Abs: 1.7 10*3/uL (ref 0.7–4.0)
MCH: 28.7 pg (ref 26.0–34.0)
MCHC: 31.7 g/dL (ref 30.0–36.0)
MCV: 90.5 fL (ref 80.0–100.0)
Monocytes Absolute: 0.3 10*3/uL (ref 0.1–1.0)
Monocytes Relative: 8 %
Neutro Abs: 2.2 10*3/uL (ref 1.7–7.7)
Neutrophils Relative %: 52 %
Platelets: 167 10*3/uL (ref 150–400)
RBC: 4.53 MIL/uL (ref 3.87–5.11)
RDW: 15.2 % (ref 11.5–15.5)
WBC: 4.2 10*3/uL (ref 4.0–10.5)
nRBC: 0 % (ref 0.0–0.2)

## 2018-08-24 LAB — COMPREHENSIVE METABOLIC PANEL
ALT: 26 U/L (ref 0–44)
AST: 30 U/L (ref 15–41)
Albumin: 3.5 g/dL (ref 3.5–5.0)
Alkaline Phosphatase: 44 U/L (ref 38–126)
Anion gap: 14 (ref 5–15)
BUN: 11 mg/dL (ref 6–20)
CO2: 22 mmol/L (ref 22–32)
Calcium: 8.4 mg/dL — ABNORMAL LOW (ref 8.9–10.3)
Chloride: 97 mmol/L — ABNORMAL LOW (ref 98–111)
Creatinine, Ser: 0.96 mg/dL (ref 0.44–1.00)
GFR calc Af Amer: >60 mL/min (ref >60)
GFR calc non Af Amer: >60 mL/min (ref >60)
Glucose, Bld: 115 mg/dL — ABNORMAL HIGH (ref 70–99)
Potassium: 3.4 mmol/L — ABNORMAL LOW (ref 3.5–5.1)
Sodium: 133 mmol/L — ABNORMAL LOW (ref 135–145)
Total Bilirubin: 0.6 mg/dL (ref 0.3–1.2)
Total Protein: 7.7 g/dL (ref 6.5–8.1)

## 2018-08-24 MED ORDER — POTASSIUM CHLORIDE CRYS ER 20 MEQ PO TBCR
40.0000 meq | EXTENDED_RELEASE_TABLET | Freq: Once | ORAL | Status: AC
  Administered 2018-08-24: 40 meq via ORAL
  Filled 2018-08-24: qty 2

## 2018-08-24 MED ORDER — ALBUTEROL SULFATE HFA 108 (90 BASE) MCG/ACT IN AERS
2.0000 | INHALATION_SPRAY | Freq: Once | RESPIRATORY_TRACT | Status: AC
  Administered 2018-08-24: 2 via RESPIRATORY_TRACT
  Filled 2018-08-24: qty 6.7

## 2018-08-24 MED ORDER — ACETAMINOPHEN 500 MG PO TABS
1000.0000 mg | ORAL_TABLET | Freq: Once | ORAL | Status: AC
  Administered 2018-08-24: 1000 mg via ORAL
  Filled 2018-08-24: qty 2

## 2018-08-24 MED ORDER — AEROCHAMBER Z-STAT PLUS/MEDIUM MISC
1.0000 | Freq: Once | Status: AC
  Administered 2018-08-24: 1
  Filled 2018-08-24 (×2): qty 1

## 2018-08-24 MED ORDER — ONDANSETRON HCL 4 MG PO TABS: 4.0000 mg | ORAL_TABLET | Freq: Three times a day (TID) | ORAL | 0 refills | Status: DC | PRN

## 2018-08-24 MED ORDER — ONDANSETRON 4 MG PO TBDP
4.0000 mg | ORAL_TABLET | Freq: Once | ORAL | Status: AC
  Administered 2018-08-24: 4 mg via ORAL
  Filled 2018-08-24: qty 1

## 2018-08-24 NOTE — ED Notes (Signed)
X-ray at bedside

## 2018-08-24 NOTE — ED Triage Notes (Signed)
Pt POV reports SHOB which has been gradually worsening over the past week, generalized weakness, body aches, lack of taste.  Pt tested Monday for COVID, informed she was positive Wednesday.  Intermittent chest pain, denies CP at time of assessment.

## 2018-08-24 NOTE — ED Notes (Signed)
Per Patient Engagement, patient tested positive for Covid in 3 days ago-having increased SOB

## 2018-08-24 NOTE — ED Notes (Signed)
Pt ambulatory to ED exit.  

## 2018-08-24 NOTE — ED Notes (Signed)
Discharge paperwork reviewed with pt, emphasis placed on paying attention to breathing and hypoxia symptoms.  Pt verbalized understanding.

## 2018-08-24 NOTE — Discharge Instructions (Signed)
You have been seen here in the emergency department for your coronavirus.  You will need to self isolate in your home.  You will also need to isolate from family members as much as possible.  I am giving you the CDC isolation home guidelines which include cleaning precautions. I consulted with our hospitalist because when we walked you your oxygen saturation dropped mildly.  He has declined to admit you at this time however it is imperative that you return immediately if you are having any increasing difficulty with breathing.  Please come at any hour we are open 24 hours a day. May also return for any new or concerning symptoms or worsening in your current symptoms. It is important that you continue to eat food and especially to sip fluids all day long and maintain your hydration status. It is very easy to become dehydrated when you are running a fever and have an increased respiratory rate.

## 2018-08-24 NOTE — Consult Note (Addendum)
Medical Consultation   Deborah Haney  ZJQ:[id number]  DOB: 08-18-1965  DOA: 08/24/2018  PCP: Girtha Rm, NP-C    Requesting physician:Abigail Harris,PA   Reason for consultation: Covid-19   History of Present Illness: Patient is a 53 year old female with past medical history of hypertension, diabetes who presents to the emergency part with complaints of generalized weakness, headache, chills.  She was diagnosed with COVID-19 on last Wednesday.  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  She did not know how she was exposed to COVID-19.  She actually started becoming sick on Saturday night when she developed headache, shakiness.She lives alone. Patient was staying at home but started became more weak, started having fever, headache, body ache.  Also had mild grade fever.  Also has some cough since last 2 days.  She gets short of breath when she ambulates and has chest pain while taking deep breath. Work-up done in the emergency department shows mild hyponatremia of 133, potassium of 3.4.  Chest x-ray did not show any pneumonia. She is saturating fine with 99% on room air.  As per the report given by PA and RN, she desaturated to 88% when ambulating in the room. Patient seen and examined the bedside in the emergency department.  She was hemodynamically stable and comfortable during my evaluation.  Was not coughing.  Not in any kind of respiratory distress.  Oxygenation was 99% on room air. Patient denied any chest pain, abdomen pain, dysuria, nausea, vomiting, diarrhea.   Review of Systems:  ROS As per HPI otherwise 10 point review of systems negative.     Past Medical History: Past Medical History:  Diagnosis Date  . Acute cholecystitis 07/14/2013  . Anemia    has history, takes iron  . Anxiety   . Bipolar affective disorder ([place]) 10/26/2015   Has seen counselor, declined medications per medical record from [facility]. hypermanic  . Chest pain 07/14/2013   . Chronic vaginitis 10/26/2015   Has h/o vag dryness and tried Estrace per [facility] records  . Depression   . Diabetes ([place])   . Diverticulitis    no problems  . Dysfunctional uterine bleeding 03/02/2011  . Elevated hemoglobin A1c 10/26/2015  . GERD (gastroesophageal reflux disease)    tums prn  . Headache(784.0)   . Hemorrhoids   . HTN (hypertension)    BP meds since fall 2016  . Internal hemorrhoids   . Nausea & vomiting 07/14/2013  . Obesity 10/26/2015  . Rectal bleeding 03/15/2017    Past Surgical History: Past Surgical History:  Procedure Laterality Date  . CHOLECYSTECTOMY N/A 07/15/2013   Procedure: LAPAROSCOPIC CHOLECYSTECTOMY;  Surgeon: Rolm Bookbinder, MD;  Location: [place];  Service: General;  Laterality: N/A;  . COLONOSCOPY    . ECTOPIC PREGNANCY SURGERY     laparotomy  . ROBOTIC ASSISTED LAP VAGINAL HYSTERECTOMY       Allergies:  No Known Allergies   Social History:  reports that she quit smoking about 10 years ago. Her smoking use included cigarettes. She has never used smokeless tobacco. She reports current alcohol use. She reports that she does not use drugs.   Family History: Family History  Problem Relation Age of Onset  . Diabetes Mother   . Hypertension Mother   . Arthritis Father   . Hypertension Brother   . Cancer Brother        "in his blood"  .  Diabetes Brother        diet controlled  . Cancer Brother        unsure kind  . CVA Other   . Colon cancer Neg Hx   . Colon polyps Neg Hx   . Esophageal cancer Neg Hx   . Rectal cancer Neg Hx   . Stomach cancer Neg Hx       Physical Exam: Vitals:   08/24/18 1345 08/24/18 1430 08/24/18 1500 08/24/18 1600  BP:  106/77 114/72 112/79  Pulse: 89 82 84 87  Resp: (!) 35 (!) 28 20 19   Temp:      TempSrc:      SpO2: 99% 95% 96% 96%  Weight:      Height:        Constitutional:  Alert and awake, oriented x3, not in any acute distress. Eyes: PERLA, EOMI, irises appear normal, anicteric sclera,   ENMT: external ears and nose appear normal           Lips appears normal, oropharynx mucosa, tongue, posterior pharynx appear normal  Neck: neck appears normal, no masses, normal ROM, no thyromegaly, no JVD  CVS: S1-S2 clear, no murmur rubs or gallops, no LE edema, normal pedal pulses  Respiratory:  clear to auscultation bilaterally, no wheezing, rales or rhonchi. Respiratory effort normal. No accessory muscle use.  Abdomen: soft nontender, nondistended, normal bowel sounds, no hepatosplenomegaly, no hernias  Musculoskeletal: : no cyanosis, clubbing or edema noted bilaterally Neuro: Cranial nerves II-XII intact, strength, sensation, reflexes Psych: judgement and insight appear normal, stable mood and affect, mental status Skin: no rashes or lesions or ulcers, no induration or nodules   Data reviewed:  I have personally reviewed following labs and imaging studies Labs:  CBC: Recent Labs  Lab 08/24/18 1230  WBC 4.2  NEUTROABS 2.2  HGB 13.0  HCT 41.0  MCV 90.5  PLT 740    Basic Metabolic Panel: Recent Labs  Lab 08/24/18 1251  NA 133*  K 3.4*  CL 97*  CO2 22  GLUCOSE 115*  BUN 11  CREATININE 0.96  CALCIUM 8.4*   GFR Estimated Creatinine Clearance: 80.6 mL/min (by C-G formula based on SCr of 0.96 mg/dL). Liver Function Tests: Recent Labs  Lab 08/24/18 1251  AST 30  ALT 26  ALKPHOS 44  BILITOT 0.6  PROT 7.7  ALBUMIN 3.5   No results for input(s): LIPASE, AMYLASE in the last 168 hours. No results for input(s): AMMONIA in the last 168 hours. Coagulation profile No results for input(s): INR, PROTIME in the last 168 hours.  Cardiac Enzymes: No results for input(s): CKTOTAL, CKMB, CKMBINDEX, TROPONINI in the last 168 hours. BNP: Invalid input(s): POCBNP CBG: Recent Labs  Lab 08/24/18 1228  GLUCAP 110*   D-Dimer No results for input(s): DDIMER in the last 72 hours. Hgb A1c No results for input(s): HGBA1C in the last 72 hours. Lipid Profile No results for  input(s): CHOL, HDL, LDLCALC, TRIG, CHOLHDL, LDLDIRECT in the last 72 hours. Thyroid function studies No results for input(s): TSH, T4TOTAL, T3FREE, THYROIDAB in the last 72 hours.  Invalid input(s): FREET3 Anemia work up No results for input(s): VITAMINB12, FOLATE, FERRITIN, TIBC, IRON, RETICCTPCT in the last 72 hours. Urinalysis    Component Value Date/Time   COLORURINE YELLOW 08/24/2018 1411   APPEARANCEUR CLEAR 08/24/2018 1411   LABSPEC 1.011 08/24/2018 1411   LABSPEC 1.015 04/02/2018 1628   PHURINE 5.0 08/24/2018 1411   GLUCOSEU NEGATIVE 08/24/2018 1411   HGBUR NEGATIVE 08/24/2018  [place] 08/24/2018 1411   BILIRUBINUR negative 04/02/2018 1628   BILIRUBINUR n 01/18/2016 1611   KETONESUR 5 (A) 08/24/2018 1411   PROTEINUR NEGATIVE 08/24/2018 1411   UROBILINOGEN negative 01/18/2016 1611   UROBILINOGEN 0.2 07/14/2013 0253   NITRITE NEGATIVE 08/24/2018 1411   LEUKOCYTESUR NEGATIVE 08/24/2018 1411     Microbiology Recent Results (from the past 240 hour(s))  Novel Coronavirus, NAA ([facility])     Status: Abnormal   Collection Time: 08/21/18 11:16 AM  Result Value Ref Range Status   SARS-CoV-2, NAA Detected (A) Not Detected Final    Comment: This test was developed and its performance characteristics determined by [employer]. This test has not been FDA cleared or approved. This test has been authorized by FDA under an Emergency Use Authorization (EUA). This test is only authorized for the duration of time the declaration that circumstances exist justifying the authorization of the emergency use of in vitro diagnostic tests for detection of SARS-CoV-2 virus and/or diagnosis of COVID-19 infection under section 564(b)(1) of the Act, 21 U.S.C. 818HUD-1(S)(9), unless the authorization is terminated or revoked sooner. When diagnostic testing is negative, the possibility of a false negative result should be considered in the context of a patient's recent  exposures and the presence of clinical signs and symptoms consistent with COVID-19. An individual without symptoms of COVID-19 and who is not shedding SARS-CoV-2 virus would expect to have a negative (not detected) result in this assay.        Inpatient Medications:   Scheduled Meds: Continuous Infusions:   Radiological Exams on Admission: Dg Chest Port 1 View  Result Date: 08/24/2018 CLINICAL DATA:  covid + with increasing symptoms over last week EXAM: PORTABLE CHEST 1 VIEW COMPARISON:  Chest x-rays dated 04/02/2018 and 04/17/2017. FINDINGS: The heart size and mediastinal contours are within normal limits. Both lungs are clear. The visualized skeletal structures are unremarkable. IMPRESSION: No active disease. Electronically Signed   By: Franki Cabot M.D.   On: 08/24/2018 12:29    Impression/Recommendations Principal Problem:   COVID-19 virus infection Active Problems:   HTN (hypertension)    COVID-19: Currently hemodynamically stable.  Saturating 99% on room air.  Reported that she desaturated to 88% on  Ambulation.  She did not qualify for oxygen.  Lungs are essentially clear on auscultation.  Chest x-ray did not show any pneumonia. She does not need admission at this point.  She can be discharged. I have requested her to isolate herself for next 2 weeks.  I have explained that this is a viral illness and it will take it course and should resolve by itself.  She does not need any medications at this point. If her respiratory status deteriorates, I have requested her to present herself to the emergency department again.  Hypertension: Current blood pressure stable.  Continue her home meds   Thank you for this consultation.    Time Spent: 50 mins.  Shelly Coss M.D. [facility] [phone] 08/24/2018, 4:16 PM

## 2018-08-24 NOTE — ED Notes (Signed)
ED Provider at bedside. 

## 2018-08-24 NOTE — Telephone Encounter (Signed)
Please let her know that we are aware of her symptoms and that she is going to the [facility] ED. We are thinking of her.

## 2018-08-24 NOTE — Telephone Encounter (Signed)
Pt was notified.  

## 2018-08-24 NOTE — ED Notes (Signed)
Patient ambulated around room. Patient sat dropped to 88% on room air. Patient stated she felt dizzy. This Probation officer assisted into the bed. This Probation officer informed [place], [place].

## 2018-08-24 NOTE — ED Notes (Signed)
Bed: HQ01 Expected date:  Expected time:  Means of arrival:  Comments: Covid + pt

## 2018-08-24 NOTE — Telephone Encounter (Signed)
Contacted pt due to my chart companion response of shortness of breath on 08/24/2018; she says that she has SOB with exertion, and laying down which is worse when she is on her right side; recommended: 5: [place] (ED):  * You will need to go to a nearby ED.  * Do not leave until I've called and talked with the ED. The ED may have special instructions on how best to get you there. I will call you back (or place you on hold).    6: YOU SHOULD TELL HEALTHCARE PERSONNEL THAT YOUR MIGHT HAVE COVID-19:  * Tell the first healthcare worker you meet that you may have COVID-19.  * Tell them you have symptoms and have been sent for COVID-19 testing.   7: YOU SHOULD COVER YOUR MOUTH AND NOSE, WEAR A MASK:  * Cover your mouth and nose with a disposable tissue (e.g., Kleenex, toilet paper, paper towel) or wash cloth.  * Ask for a mask to wear over your mouth and nose.   8: DRIVING: Another adult should drive.  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

## 2018-08-24 NOTE — ED Provider Notes (Signed)
[facility] DEPT Provider Note   CSN: [id number] Arrival date & time: 08/24/18  1104     History   Chief Complaint Chief Complaint  Patient presents with   Shortness of Breath    Covid positive   Weakness    HPI Deborah Haney is a 53 y.o. female with a past medical history of diabetes who presents emergency department with flulike symptoms.  She has been told she is currently positive for the coronavirus.  Symptoms began 5 days ago and she was told she was +2 days ago.  She has symptoms of fever, chills, body aches, loss of sense of taste and smell, decreased appetite, cough.  She denies shortness of breath.  She has no history of asthma.     HPI  Past Medical History:  Diagnosis Date   Acute cholecystitis 07/14/2013   Anemia    has history, takes iron   Anxiety    Bipolar affective disorder ([place]) 10/26/2015   Has seen counselor, declined medications per medical record from [facility]. hypermanic   Chest pain 07/14/2013   Chronic vaginitis 10/26/2015   Has h/o vag dryness and tried Estrace per [facility] records   Depression    Diabetes ([place])    Diverticulitis    no problems   Dysfunctional uterine bleeding 03/02/2011   Elevated hemoglobin A1c 10/26/2015   GERD (gastroesophageal reflux disease)    tums prn   Headache(784.0)    Hemorrhoids    HTN (hypertension)    BP meds since fall 2016   Internal hemorrhoids    Nausea & vomiting 07/14/2013   Obesity 10/26/2015   Rectal bleeding 03/15/2017    Patient Active Problem List   Diagnosis Date Noted   Cough 06/12/2018   SOB (shortness of breath) 06/12/2018   Allergic rhinitis due to pollen 06/12/2018   Body aches 06/12/2018   Acute cystitis with hematuria 03/05/2018   Dyslipidemia 06/05/2017   Diabetes ([place])    Hemorrhoids 03/15/2017   Rectal bleeding 03/15/2017   HTN (hypertension) 10/26/2015   Obesity 10/26/2015   Chronic vaginitis 10/26/2015   Bipolar  affective disorder ([place]) 10/26/2015   Nausea & vomiting 07/14/2013   Acute cholecystitis 07/14/2013   Chest pain 07/14/2013   Dysfunctional uterine bleeding 03/02/2011    Past Surgical History:  Procedure Laterality Date   CHOLECYSTECTOMY N/A 07/15/2013   Procedure: LAPAROSCOPIC CHOLECYSTECTOMY;  Surgeon: Rolm Bookbinder, MD;  Location: [facility] OR;  Service: General;  Laterality: N/A;   COLONOSCOPY     ECTOPIC PREGNANCY SURGERY     laparotomy   ROBOTIC ASSISTED LAP VAGINAL HYSTERECTOMY       OB History    Gravida  1   Para  0   Term  0   Preterm  0   AB  1   Living  0     SAB  0   TAB  0   Ectopic  1   Multiple  0   Live Births               Home Medications    Prior to Admission medications   Medication Sig Start Date End Date Taking? Authorizing Provider  Blood Glucose Monitoring Suppl (CONTOUR MONITOR) w/Device KIT 1 kit by Does not apply route 2 (two) times a day. 07/02/18   Henson, Vickie L, NP-C  glucose blood (CONTOUR NEXT TEST) test strip Test twice a day 07/02/18   Harland Dingwall L, NP-C  lisinopril-hydrochlorothiazide (ZESTORETIC) 10-12.5 MG tablet Take 1  tablet by mouth daily. 07/02/18   Girtha Rm, NP-C  Microlet Lancets MISC Test blood sugar twice a day 07/02/18   Henson, Vickie L, NP-C  mupirocin ointment (BACTROBAN) 2 % Apply thin layer to affected area 3 times daily x 7 days. 01/29/18   Girtha Rm, NP-C    Family History Family History  Problem Relation Age of Onset   Diabetes Mother    Hypertension Mother    Arthritis Father    Hypertension Brother    Cancer Brother        "in his blood"   Diabetes Brother        diet controlled   Cancer Brother        unsure kind   CVA Other    Colon cancer Neg Hx    Colon polyps Neg Hx    Esophageal cancer Neg Hx    Rectal cancer Neg Hx    Stomach cancer Neg Hx     Social History Social History   Tobacco Use   Smoking status: Former Smoker    Types:  Cigarettes    Quit date: 04/18/2008    Years since quitting: 10.3   Smokeless tobacco: Never Used  Substance Use Topics   Alcohol use: Yes    Comment: 1 glass of wine rarely, occasionally   Drug use: No    Types: Cocaine    Comment: Former cocaine use since 2012     Allergies   Patient has no known allergies.   Review of Systems Review of Systems Ten systems reviewed and are negative for acute change, except as noted in the HPI.   Physical Exam Updated Vital Signs BP 104/75 (BP Location: Left Arm)    Pulse 85    Temp 100.1 F (37.8 C) (Oral)    Resp 14    Ht '5\' 6"'  (1.676 m)    Wt 93 kg    LMP 04/25/2011    SpO2 98%    BMI 33.09 kg/m   Physical Exam Vitals signs and nursing note reviewed.  Constitutional:      General: She is not in acute distress.    Appearance: She is well-developed. She is ill-appearing. She is not toxic-appearing or diaphoretic.  HENT:     Head: Normocephalic and atraumatic.  Eyes:     General: No scleral icterus.    Conjunctiva/sclera: Conjunctivae normal.  Neck:     Musculoskeletal: Normal range of motion.  Cardiovascular:     Rate and Rhythm: Normal rate and regular rhythm.     Heart sounds: Normal heart sounds. No murmur. No friction rub. No gallop.   Pulmonary:     Effort: Pulmonary effort is normal. No respiratory distress.     Breath sounds: Wheezing (Wheezing with cough) present.  Abdominal:     General: Bowel sounds are normal. There is no distension.     Palpations: Abdomen is soft. There is no mass.     Tenderness: There is no abdominal tenderness. There is no guarding.  Skin:    General: Skin is warm and dry.  Neurological:     Mental Status: She is alert and oriented to person, place, and time.  Psychiatric:        Behavior: Behavior normal.      ED Treatments / Results  Labs (all labs ordered are listed, but only abnormal results are displayed) Labs Reviewed - No data to display  EKG EKG  Interpretation  Date/Time:  Friday August 24 2018 11:16:20  EDT Ventricular Rate:  85 PR Interval:    QRS Duration: 96 QT Interval:  357 QTC Calculation: 425 R Axis:   61 Text Interpretation:  Sinus rhythm Probable left ventricular hypertrophy When compared with ECG of 04/02/2018 No significant change was found Confirmed by Francine Graven [phone]) on 08/24/2018 11:47:23 AM   Radiology No results found.  Procedures Procedures (including critical care time)  Medications Ordered in ED Medications  ondansetron (ZOFRAN-ODT) disintegrating tablet 4 mg (has no administration in time range)     Initial Impression / Assessment and Plan / ED Course  I have reviewed the triage vital signs and the nursing notes.  Pertinent labs & imaging results that were available during my care of the patient were reviewed by me and considered in my medical decision making (see chart for details).  Clinical Course as of Aug 23 1625  Fri Aug 24, 2018  1421 Resp(!): 35 [AH]  1445 I spoke with the patient who states that she feels improved with her breathing.  I have been watching her respiratory rate which does seem to be increasing and her oxygen saturation is slightly lower however she denies any current shortness of breath.  We will ambulate the patient with a pulse ox.   [AH]  9211 Patient ambulated within the room on pulse ox and desaturated to 88% on room air.  Her respiratory rate and oxygen saturations have been trending in and inverse proportion with respiratory rate increasing and oxygen saturations decreasing.  Think she will need at least an observation admission for her coronavirus.   [AH]    Clinical Course User Index [AH] Margarita Mail, PA-C      4:53 PM BP 121/67    Pulse 84    Temp 100.1 F (37.8 C) (Oral)    Resp 17    Ht '5\' 6"'  (1.676 m)    Wt 93 kg    LMP 04/25/2011    SpO2 100%    BMI 33.55 kg/m  53 year old African-American female with history of diabetes who presented for  evaluation of worsening flulike symptoms and was found to be coronavirus +2 days ago.  I personally reviewed the patient's lab values which are remarkably benign.  Her UA negative for infection.  She has mild hyponatremia in the setting of elevated blood glucose of 115.  CBC is without leukocytosis or lymphopenia.  She has an unremarkable chest x-ray and her EKG shows no abnormalities.  During the course of her time here in the emergency department awaiting evaluation she had increasing respiratory rate and decreasing oxygen saturation.  I had the patient ambulate in the room with pulse ox monitoring and she desaturated to 88% on room air although she did not claim to feel dyspneic.  Given her new oxygen requirement with ambulation I felt it was necessary to admit the patient.  I asked Dr. Tawanna Solo of the hospitalist service to consult on the patient for admission, however he does feel that she needs admission despite her new oxygen requirement with ambulation.  I expressed to the patient that he and I had disagreement about her disposition however she will be discharged at this point.  I had a long discussion about reasons for her to seek immediate medical care and that she should not hesitate under any circumstance to return immediately should she feel any worse or have any difficulty breathing.  We had a long discussion about her need to continue to control her temperatures with fever reducing medication and her  need to continue taking fluids and food by mouth.  The patient understands that she will be discharged.  She is currently comfortable with the plan and I believe after prolonged discussion is reliable to return immediately given her comorbid factors and risk for decompensation.   Final Clinical Impressions(s) / ED Diagnoses   Final diagnoses:  COVID-19 virus infection  Hypoxia    ED Discharge Orders    None       Margarita Mail, PA-C 08/24/18 [place], [place], DO 08/27/18  1444

## 2018-08-25 ENCOUNTER — Encounter (HOSPITAL_COMMUNITY): Payer: Self-pay | Admitting: Emergency Medicine

## 2018-08-25 ENCOUNTER — Telehealth: Payer: Self-pay

## 2018-08-25 ENCOUNTER — Encounter (INDEPENDENT_AMBULATORY_CARE_PROVIDER_SITE_OTHER): Payer: Self-pay

## 2018-08-25 NOTE — Telephone Encounter (Signed)
Pt c/o new onset diarrhea. Pt with 2 watery stools today, denies abdominal pain. Pt stated that she has a decreased appetite but is tolerating Gatorade and water. Encourage to continue drinking water and to dilute Gatorade half water half Gatorade. Pt encourage to follow BRAT diet. Pt asked to go to [facility] for increasing or constant abd pain, dry mouth or no urination for 12 hours.  Pt verbalized understanding.

## 2018-08-26 ENCOUNTER — Encounter (HOSPITAL_COMMUNITY): Payer: Self-pay | Admitting: Emergency Medicine

## 2018-08-26 ENCOUNTER — Emergency Department (HOSPITAL_COMMUNITY)
Admission: EM | Admit: 2018-08-26 | Discharge: 2018-08-26 | Disposition: A | Discharge status: Home / Self Care | Payer: BC Managed Care – PPO | Attending: Emergency Medicine | Admitting: Emergency Medicine

## 2018-08-26 ENCOUNTER — Emergency Department (HOSPITAL_COMMUNITY): Payer: BC Managed Care – PPO

## 2018-08-26 ENCOUNTER — Telehealth: Payer: Self-pay

## 2018-08-26 ENCOUNTER — Encounter (INDEPENDENT_AMBULATORY_CARE_PROVIDER_SITE_OTHER): Payer: Self-pay

## 2018-08-26 ENCOUNTER — Other Ambulatory Visit: Payer: Self-pay

## 2018-08-26 DIAGNOSIS — U071 COVID-19: Secondary | ICD-10-CM | POA: Diagnosis not present

## 2018-08-26 DIAGNOSIS — J22 Unspecified acute lower respiratory infection: Secondary | ICD-10-CM | POA: Insufficient documentation

## 2018-08-26 DIAGNOSIS — Z79899 Other long term (current) drug therapy: Secondary | ICD-10-CM | POA: Diagnosis not present

## 2018-08-26 DIAGNOSIS — E119 Type 2 diabetes mellitus without complications: Secondary | ICD-10-CM | POA: Insufficient documentation

## 2018-08-26 DIAGNOSIS — R05 Cough: Secondary | ICD-10-CM | POA: Diagnosis not present

## 2018-08-26 DIAGNOSIS — I10 Essential (primary) hypertension: Secondary | ICD-10-CM | POA: Insufficient documentation

## 2018-08-26 DIAGNOSIS — Z87891 Personal history of nicotine dependence: Secondary | ICD-10-CM | POA: Diagnosis not present

## 2018-08-26 DIAGNOSIS — R0602 Shortness of breath: Secondary | ICD-10-CM | POA: Diagnosis not present

## 2018-08-26 LAB — COMPREHENSIVE METABOLIC PANEL
ALT: 56 U/L — ABNORMAL HIGH (ref 0–44)
AST: 60 U/L — ABNORMAL HIGH (ref 15–41)
Albumin: 3.7 g/dL (ref 3.5–5.0)
Alkaline Phosphatase: 48 U/L (ref 38–126)
Anion gap: 11 (ref 5–15)
BUN: 9 mg/dL (ref 6–20)
CO2: 26 mmol/L (ref 22–32)
Calcium: 8.7 mg/dL — ABNORMAL LOW (ref 8.9–10.3)
Chloride: 98 mmol/L (ref 98–111)
Creatinine, Ser: 0.85 mg/dL (ref 0.44–1.00)
GFR calc Af Amer: >60 mL/min (ref >60)
GFR calc non Af Amer: >60 mL/min (ref >60)
Glucose, Bld: 105 mg/dL — ABNORMAL HIGH (ref 70–99)
Potassium: 3.7 mmol/L (ref 3.5–5.1)
Sodium: 135 mmol/L (ref 135–145)
Total Bilirubin: 0.6 mg/dL (ref 0.3–1.2)
Total Protein: 8.1 g/dL (ref 6.5–8.1)

## 2018-08-26 LAB — LACTIC ACID, PLASMA: Lactic Acid, Venous: 0.9 mmol/L (ref 0.5–1.9)

## 2018-08-26 LAB — CBC WITH DIFFERENTIAL/PLATELET
Abs Immature Granulocytes: 0.01 10*3/uL (ref 0.00–0.07)
Basophils Absolute: 0 10*3/uL (ref 0.0–0.1)
Basophils Relative: 0 %
Eosinophils Absolute: 0 10*3/uL (ref 0.0–0.5)
Eosinophils Relative: 0 %
HCT: 38.9 % (ref 36.0–46.0)
Hemoglobin: 12.4 g/dL (ref 12.0–15.0)
Immature Granulocytes: 0 %
Lymphocytes Relative: 33 %
Lymphs Abs: 1.2 10*3/uL (ref 0.7–4.0)
MCH: 28.6 pg (ref 26.0–34.0)
MCHC: 31.9 g/dL (ref 30.0–36.0)
MCV: 89.8 fL (ref 80.0–100.0)
Monocytes Absolute: 0.2 10*3/uL (ref 0.1–1.0)
Monocytes Relative: 5 %
Neutro Abs: 2.1 10*3/uL (ref 1.7–7.7)
Neutrophils Relative %: 62 %
Platelets: 142 10*3/uL — ABNORMAL LOW (ref 150–400)
RBC: 4.33 MIL/uL (ref 3.87–5.11)
RDW: 14.6 % (ref 11.5–15.5)
WBC: 3.5 10*3/uL — ABNORMAL LOW (ref 4.0–10.5)
nRBC: 0 % (ref 0.0–0.2)

## 2018-08-26 LAB — FIBRINOGEN: Fibrinogen: 552 mg/dL — ABNORMAL HIGH (ref 210–475)

## 2018-08-26 LAB — PROCALCITONIN: Procalcitonin: 0.1 ng/mL

## 2018-08-26 LAB — TRIGLYCERIDES: Triglycerides: 174 mg/dL — ABNORMAL HIGH (ref <150)

## 2018-08-26 LAB — C-REACTIVE PROTEIN: CRP: 3.6 mg/dL — ABNORMAL HIGH (ref <1.0)

## 2018-08-26 LAB — D-DIMER, QUANTITATIVE: D-Dimer, Quant: 0.34 ug/mL-FEU (ref 0.00–0.50)

## 2018-08-26 LAB — LACTATE DEHYDROGENASE: LDH: 231 U/L — ABNORMAL HIGH (ref 98–192)

## 2018-08-26 LAB — FERRITIN: Ferritin: 212 ng/mL (ref 11–307)

## 2018-08-26 MED ORDER — HYDROCOD POLST-CPM POLST ER 10-8 MG/5ML PO SUER
5.0000 mL | Freq: Once | ORAL | Status: AC
  Administered 2018-08-26: 5 mL via ORAL
  Filled 2018-08-26: qty 5

## 2018-08-26 MED ORDER — SODIUM CHLORIDE 0.9 % IV SOLN
1000.0000 mL | INTRAVENOUS | Status: DC
  Administered 2018-08-26: 1000 mL via INTRAVENOUS

## 2018-08-26 MED ORDER — HYDROCODONE-CHLORPHENIRAMINE 5-4 MG/5ML PO SOLN: 5.0000 mL | Freq: Two times a day (BID) | ORAL | 0 refills | Status: DC

## 2018-08-26 NOTE — ED Notes (Signed)
Pt ambulatory but requested wheelchair out for comfort.

## 2018-08-26 NOTE — ED Notes (Signed)
When attempting to ambulate patient, she used the restroom and stated that standing up made her lightheaded and she needed to sit back down. Pt assisted back into bed.

## 2018-08-26 NOTE — ED Provider Notes (Signed)
[facility] DEPT Provider Note   CSN: [id number] Arrival date & time: 08/26/18  1651    History   Chief Complaint Chief Complaint  Patient presents with  . covid poistive  . Cough  . Fatigue    HPI Deborah Haney is a 53 y.o. female.     HPI Patient presents the emergency room for evaluation of persistent respiratory symptoms.  The patient has been having trouble with cough and congestion.  She had a positive covid test on June 16.  Since then the patient has been coughing.  She was prescribed medications by her doctor but it does not seem to be helping.  She continues to cough.  Patient also started to feel little bit short of breath when she walks around.  She continues to feel feverish.  She denies any vomiting or diarrhea.  Patient called her doctor and was instructed to come to the ED because of worsening cough and wheezing. Past Medical History:  Diagnosis Date  . Acute cholecystitis 07/14/2013  . Anemia    has history, takes iron  . Anxiety   . Bipolar affective disorder ([place]) 10/26/2015   Has seen counselor, declined medications per medical record from [facility]. hypermanic  . Chest pain 07/14/2013  . Chronic vaginitis 10/26/2015   Has h/o vag dryness and tried Estrace per [facility] [facility] records  . Depression   . Diabetes ([place])   . Diverticulitis    no problems  . Dysfunctional uterine bleeding 03/02/2011  . Elevated hemoglobin A1c 10/26/2015  . GERD (gastroesophageal reflux disease)    tums prn  . Headache(784.0)   . Hemorrhoids   . HTN (hypertension)    BP meds since fall 2016  . Internal hemorrhoids   . Nausea & vomiting 07/14/2013  . Obesity 10/26/2015  . Rectal bleeding 03/15/2017    Patient Active Problem List   Diagnosis Date Noted  . COVID-19 virus infection 08/24/2018  . Cough 06/12/2018  . SOB (shortness of breath) 06/12/2018  . Allergic rhinitis due to pollen 06/12/2018  . Body aches 06/12/2018  . Acute cystitis with hematuria  03/05/2018  . Dyslipidemia 06/05/2017  . Diabetes ([place])   . Hemorrhoids 03/15/2017  . Rectal bleeding 03/15/2017  . HTN (hypertension) 10/26/2015  . Obesity 10/26/2015  . Chronic vaginitis 10/26/2015  . Bipolar affective disorder ([place]) 10/26/2015  . Nausea & vomiting 07/14/2013  . Acute cholecystitis 07/14/2013  . Chest pain 07/14/2013  . Dysfunctional uterine bleeding 03/02/2011    Past Surgical History:  Procedure Laterality Date  . CHOLECYSTECTOMY N/A 07/15/2013   Procedure: LAPAROSCOPIC CHOLECYSTECTOMY;  Surgeon: Rolm Bookbinder, MD;  Location: [facility];  Service: General;  Laterality: N/A;  . COLONOSCOPY    . ECTOPIC PREGNANCY SURGERY     laparotomy  . ROBOTIC ASSISTED LAP VAGINAL HYSTERECTOMY       OB History    Gravida  1   Para  0   Term  0   Preterm  0   AB  1   Living  0     SAB  0   TAB  0   Ectopic  1   Multiple  0   Live Births               Home Medications    Prior to Admission medications   Medication Sig Start Date End Date Taking? Authorizing Provider  Blood Glucose Monitoring Suppl (CONTOUR MONITOR) w/Device KIT 1 kit by Does not apply route 2 (two) times  a day. 07/02/18   Harland Dingwall L, NP-C  glucose blood (CONTOUR NEXT TEST) test strip Test twice a day 07/02/18   Girtha Rm, NP-C  HYDROcodone-Chlorpheniramine 5-4 MG/5ML SOLN Take 5 mLs by mouth 2 times daily at 12 noon and 4 pm. 08/26/18   Dorie Rank, MD  lisinopril-hydrochlorothiazide (ZESTORETIC) 10-12.5 MG tablet Take 1 tablet by mouth daily. 07/02/18   Girtha Rm, NP-C  Microlet Lancets MISC Test blood sugar twice a day 07/02/18   Henson, Vickie L, NP-C  mupirocin ointment (BACTROBAN) 2 % Apply thin layer to affected area 3 times daily x 7 days. Patient not taking: Reported on 08/24/2018 01/29/18   Harland Dingwall L, NP-C  ondansetron (ZOFRAN) 4 MG tablet Take 1 tablet (4 mg total) by mouth every 8 (eight) hours as needed for nausea or vomiting. 08/24/18   Margarita Mail, PA-C    Family History Family History  Problem Relation Age of Onset  . Diabetes Mother   . Hypertension Mother   . Arthritis Father   . Hypertension Brother   . Cancer Brother        "in his blood"  . Diabetes Brother        diet controlled  . Cancer Brother        unsure kind  . CVA Other   . Colon cancer Neg Hx   . Colon polyps Neg Hx   . Esophageal cancer Neg Hx   . Rectal cancer Neg Hx   . Stomach cancer Neg Hx     Social History Social History   Tobacco Use  . Smoking status: Former Smoker    Types: Cigarettes    Quit date: 04/18/2008    Years since quitting: 10.3  . Smokeless tobacco: Never Used  Substance Use Topics  . Alcohol use: Yes    Comment: 1 glass of wine rarely, occasionally  . Drug use: No    Types: Cocaine    Comment: Former cocaine use since 2012     Allergies   Patient has no known allergies.   Review of Systems Review of Systems  All other systems reviewed and are negative.    Physical Exam Updated Vital Signs BP 99/68   Pulse 87   Temp 99.4 F (37.4 C) (Oral)   Resp (!) 23   LMP 04/25/2011   SpO2 97%   Physical Exam Vitals signs and nursing note reviewed.  Constitutional:      General: She is not in acute distress.    Appearance: She is well-developed.  HENT:     Head: Normocephalic and atraumatic.     Right Ear: External ear normal.     Left Ear: External ear normal.  Eyes:     General: No scleral icterus.       Right eye: No discharge.        Left eye: No discharge.     Conjunctiva/sclera: Conjunctivae normal.  Neck:     Musculoskeletal: Neck supple.     Trachea: No tracheal deviation.  Cardiovascular:     Rate and Rhythm: Normal rate and regular rhythm.  Pulmonary:     Effort: Pulmonary effort is normal. No respiratory distress.     Breath sounds: Normal breath sounds. No stridor. No wheezing or rales.     Comments: Frequent coughing Abdominal:     General: Bowel sounds are normal. There is no  distension.     Palpations: Abdomen is soft.     Tenderness: There is  no abdominal tenderness. There is no guarding or rebound.  Musculoskeletal:        General: No tenderness.  Skin:    General: Skin is warm and dry.     Findings: No rash.  Neurological:     Mental Status: She is alert.     Cranial Nerves: No cranial nerve deficit (no facial droop, extraocular movements intact, no slurred speech).     Sensory: No sensory deficit.     Motor: No abnormal muscle tone or seizure activity.     Coordination: Coordination normal.      ED Treatments / Results  Labs (all labs ordered are listed, but only abnormal results are displayed) Labs Reviewed  CBC WITH DIFFERENTIAL/PLATELET - Abnormal; Notable for the following components:      Result Value   WBC 3.5 (*)    Platelets 142 (*)    All other components within normal limits  COMPREHENSIVE METABOLIC PANEL - Abnormal; Notable for the following components:   Glucose, Bld 105 (*)    Calcium 8.7 (*)    AST 60 (*)    ALT 56 (*)    All other components within normal limits  LACTATE DEHYDROGENASE - Abnormal; Notable for the following components:   LDH 231 (*)    All other components within normal limits  TRIGLYCERIDES - Abnormal; Notable for the following components:   Triglycerides 174 (*)    All other components within normal limits  FIBRINOGEN - Abnormal; Notable for the following components:   Fibrinogen 552 (*)    All other components within normal limits  C-REACTIVE PROTEIN - Abnormal; Notable for the following components:   CRP 3.6 (*)    All other components within normal limits  CULTURE, BLOOD (ROUTINE X 2)  CULTURE, BLOOD (ROUTINE X 2)  LACTIC ACID, PLASMA  D-DIMER, QUANTITATIVE (NOT AT [facility])  PROCALCITONIN  FERRITIN  LACTIC ACID, PLASMA  I-STAT BETA HCG BLOOD, ED ([facility], [facility], [facility] ONLY)    EKG EKG Interpretation  Date/Time:  Sunday August 26 2018 18:46:38 EDT Ventricular Rate:  86 PR Interval:    QRS Duration: 96 QT  Interval:  365 QTC Calculation: 437 R Axis:   53 Text Interpretation:  Sinus rhythm Borderline prolonged PR interval No significant change since last tracing Confirmed by Dorie Rank [phone]) on 08/26/2018 6:48:37 PM   Radiology Dg Chest Port 1 View  Result Date: 08/26/2018 CLINICAL DATA:  Worsening cough. Covid positive. EXAM: PORTABLE CHEST 1 VIEW COMPARISON:  08/24/2018 FINDINGS: Cardiomediastinal silhouette is normal. Mediastinal contours appear intact. Subtle patchy lower lobe predominant peripheral airspace opacities. Osseous structures are without acute abnormality. Soft tissues are grossly normal. IMPRESSION: Subtle patchy peripheral lower lobe predominant airspace opacities may represent early multifocal viral pneumonia. Electronically Signed   By: Fidela Salisbury M.D.   On: 08/26/2018 18:43    Procedures Procedures (including critical care time)  Medications Ordered in ED Medications  0.9 %  sodium chloride infusion (1,000 mLs Intravenous New Bag/Given 08/26/18 1839)  chlorpheniramine-HYDROcodone (TUSSIONEX) 10-8 MG/5ML suspension 5 mL (5 mLs Oral Given 08/26/18 1839)     Initial Impression / Assessment and Plan / ED Course  I have reviewed the triage vital signs and the nursing notes.  Pertinent labs & imaging results that were available during my care of the patient were reviewed by me and considered in my medical decision making (see chart for details).   Patient's laboratory tests are reassuring.  No signs of anemia.  Lactic acid levels not elevated.  She does have increase in her fibrinogen and CRP that would go along with her COVID-19 illness.  X-ray does show some subtle patchy airspace opacities.  Patient is not requiring any oxygen.  She is not tachypneic.  Her symptoms did improve with cough medicines.  She is able to eat and drink here in the emergency room.  She certainly is feeling rather ill from her COVID-19 but I do not think at this point she requires  hospitalization we can continue to try to manage as an outpatient.  Warning signs and precautions discussed.  Final Clinical Impressions(s) / ED Diagnoses   Final diagnoses:  Lower respiratory tract infection due to COVID-19 virus    ED Discharge Orders         Ordered    HYDROcodone-Chlorpheniramine 5-4 MG/5ML SOLN  2 times daily     08/26/18 2037           Dorie Rank, MD 08/26/18 2039

## 2018-08-26 NOTE — ED Triage Notes (Signed)
Pt covid positive with worsening cough that no relief with medications. Reports very tired. Denies pain.

## 2018-08-26 NOTE — ED Notes (Signed)
Bed: FQ72 Expected date:  Expected time:  Means of arrival:  Comments: COvid (AB)

## 2018-08-26 NOTE — Telephone Encounter (Signed)
Cough had increased today. Pt is taking benzonatate x 2 doses today. Pt with frequent productive cough with white phlegm. Pt is wheezing and inhalers for wheezing. No inhalers noted on profile but was prescribed Albuterol. Pt has been taking 2 puffs every four hours. Pt cannot tell that it is helping. Pt is SOB with exertion only.  Left VM with Reynolds Bowl FNP. Adonis Brook FNP called back and gave pt information and worsening cough. FNP reviewed chart and updated on above note. Asked to call pt and advise pt to go to ED for evaluation. Called pt and advised to go to [facility] ED. Called ED CN Patty at [phone]. Gave phone number to pt and name and asked pt to park car and call CN when she gets there. Pt verbalized understanding.

## 2018-08-26 NOTE — Discharge Instructions (Signed)
Continue the medication to help with cough, make sure to stay hydrated to try to eat eat, and as needed for worsening symptoms

## 2018-08-26 NOTE — ED Notes (Signed)
EDP at bedside  

## 2018-08-26 NOTE — Telephone Encounter (Signed)
Contacted patient due to complaint of worsening COVID symptoms.  Patient reports increased cough but denies any sputum production.  Advised patient to treat with OTC medications as well hard candy, cough drops, and warm fluids.  Also advised that 2 tsp of honey at bedtime could be beneficial.

## 2018-08-27 ENCOUNTER — Encounter (INDEPENDENT_AMBULATORY_CARE_PROVIDER_SITE_OTHER): Payer: Self-pay

## 2018-08-27 ENCOUNTER — Telehealth: Payer: Self-pay | Admitting: Family Medicine

## 2018-08-27 MED ORDER — PROMETHAZINE-DM 6.25-15 MG/5ML PO SYRP: 5.0000 mL | ORAL_SOLUTION | Freq: Four times a day (QID) | ORAL | 0 refills | Status: DC | PRN

## 2018-08-27 NOTE — Telephone Encounter (Signed)
Pt called and states that the hydrocodone cough syrup that was prescribed to her in the ed is not aviaiable at the pharmacy I called to confirm this and she is correct, she is needing something else called in is possible pt can be reached at [phone] pt need is to be sent in to [place], [place]

## 2018-08-27 NOTE — Telephone Encounter (Signed)
Please send in promethazine -DM for her to take. Let her know that this is not a narcotic but will make her sleepy so be careful and do not drive with it.

## 2018-08-27 NOTE — Telephone Encounter (Signed)
Pt was notified and I have sent in med

## 2018-08-28 ENCOUNTER — Telehealth: Payer: Self-pay | Admitting: *Deleted

## 2018-08-28 ENCOUNTER — Encounter (INDEPENDENT_AMBULATORY_CARE_PROVIDER_SITE_OTHER): Payer: Self-pay

## 2018-08-28 NOTE — Telephone Encounter (Signed)
Contacted pt regarding her symptoms; she states that she has been using the Phenergan -DM for cough, but she still has "break out coughing"; recommendations: COUGHING SPELLS:   * Drink warm fluids. Inhale warm mist. (Reason: both relax the airway and loosen up the phlegm)   * Suck on cough drops or hard candy to coat the irritated throat; pt also advised to contact her PCP for further recommendations; she verbalized understanding.

## 2018-08-28 NOTE — Telephone Encounter (Signed)
Attempted to contact pt due to my chart companion response of worsening cough on 08/28/2018; left message on voicemail.

## 2018-08-29 ENCOUNTER — Encounter: Payer: Self-pay | Admitting: Family Medicine

## 2018-08-29 ENCOUNTER — Telehealth: Payer: Self-pay | Admitting: *Deleted

## 2018-08-29 ENCOUNTER — Encounter (INDEPENDENT_AMBULATORY_CARE_PROVIDER_SITE_OTHER): Payer: Self-pay

## 2018-08-29 NOTE — Telephone Encounter (Signed)
Contacted pt due to my chart companion response of new diarrhea; the pt says that she had 4 episodes of watery diarrhea this morning; her last episode was at 0645; recommendations: 2: FLUID THERAPY DURING MILD-MODERATE DIARRHEA:  * Drink more fluids, at least 8-10 cups daily. One cup equals 8 oz (240 ml).  * WATER: For mild to moderate diarrhea, water is often the best liquid to drink. You should also eat some salty foods (e.g., potato chips, pretzels, saltine crackers). This is important to make sure you are getting enough salt, sugars, and fluids to meet your body's needs.  * SPORTS DRINKS: You can also drink a sports drinks (e.g., Gatorade, Powerade) to help treat and prevent dehydration. For it to work best, mix it half and half with water.  * Avoid caffeinated beverages (Reason: caffeine is mildly dehydrating).  * Avoid alcohol beverages (beer, wine, hard liquor).    3: FOOD AND NUTRITION DURING MILD-MODERATE DIARRHEA  * Maintaining some food intake during episodes of diarrhea is important.  * Begin with boiled starches / cereals (e.g., potatoes, rice, noodles, wheat, oats) with a small amount of salt to taste.  * Other foods that are OK include: bananas, yogurt, crackers, soup.  * As the diarrhea starts to get better, you can slowly return to a normal diet.   4: DIARRHEA MEDICINE - Loperamide (Imodium AD):  * This medicine helps decrease diarrhea. It is available over-the-counter (OTC) in a drug store.  * Adult dosage: 4 mg (2 capsules) is the recommended first dose. You may take an additional 2 mg (1 capsule) after each loose BM.  * Maximum dosage: 16 mg per day (8 capsules).  * Do not use for more than 2 days.   5: CAUTION - Loperamide (Imodium AD):  * DO NOT use if there is a fever over 100.5 F (38.1 C) or if there is blood or mucus in the stools.  * Read and follow the package instructions carefully.   6: DIARRHEA MEDICINE - Bismuth Subsalicylate (e.g., Kaopectate, Pepto-Bismol):  *  This medicine can help reduce diarrhea, vomiting, and abdominal cramping. It is available over-the-counter (OTC) in a drug store.  * Adult dosage: Take two tablets or two tablespoons by mouth every hour (if diarrhea continues) to a maximum of 8 doses in a 24 hour period.  * Do not use for more than 2 days.   7: CAUTION - Bismuth Subsalicylate (e.g., Kaopectate, Pepto-Bismol):  * May cause a temporary darkening of stool and tongue.  * Do not use if allergic to aspirin.  * Do not use in pregnancy.  * Read and follow the package instructions carefully.   8: CONTAGIOUSNESS:  * Be certain to wash your hands after using the restroom.  * If your work is cooking, Research scientist (medical), serving or preparing food, then you should not work until the diarrhea has completely stopped.   9: CALL BACK IF:  * Signs of dehydration occur (e.g., no urine over 12 hours, very dry mouth, lightheaded, etc.)  * Bloody stools  * Constant or severe abdominal pain  * You become worse.   10: CARE ADVICE given per Diarrhea (Adult) guideline.  Pt also advised to contact her PCP for further recommendations; she verbalized understanding.

## 2018-08-30 ENCOUNTER — Encounter (INDEPENDENT_AMBULATORY_CARE_PROVIDER_SITE_OTHER): Payer: Self-pay

## 2018-08-31 ENCOUNTER — Inpatient Hospital Stay (HOSPITAL_COMMUNITY)
Admission: EM | Admit: 2018-08-31 | Discharge: 2018-09-06 | DRG: 177 | Disposition: A | Discharge status: Home / Self Care | Payer: BC Managed Care – PPO | Attending: Internal Medicine | Admitting: Internal Medicine

## 2018-08-31 ENCOUNTER — Encounter: Payer: Self-pay | Admitting: Family Medicine

## 2018-08-31 ENCOUNTER — Emergency Department (HOSPITAL_COMMUNITY): Payer: BC Managed Care – PPO

## 2018-08-31 ENCOUNTER — Encounter (HOSPITAL_COMMUNITY): Payer: Self-pay

## 2018-08-31 ENCOUNTER — Other Ambulatory Visit: Payer: Self-pay

## 2018-08-31 ENCOUNTER — Encounter (INDEPENDENT_AMBULATORY_CARE_PROVIDER_SITE_OTHER): Payer: Self-pay

## 2018-08-31 DIAGNOSIS — T380X5A Adverse effect of glucocorticoids and synthetic analogues, initial encounter: Secondary | ICD-10-CM | POA: Diagnosis not present

## 2018-08-31 DIAGNOSIS — Z8261 Family history of arthritis: Secondary | ICD-10-CM | POA: Diagnosis not present

## 2018-08-31 DIAGNOSIS — F329 Major depressive disorder, single episode, unspecified: Secondary | ICD-10-CM | POA: Diagnosis not present

## 2018-08-31 DIAGNOSIS — Z8249 Family history of ischemic heart disease and other diseases of the circulatory system: Secondary | ICD-10-CM | POA: Diagnosis not present

## 2018-08-31 DIAGNOSIS — I10 Essential (primary) hypertension: Secondary | ICD-10-CM

## 2018-08-31 DIAGNOSIS — R0902 Hypoxemia: Secondary | ICD-10-CM | POA: Diagnosis not present

## 2018-08-31 DIAGNOSIS — J9601 Acute respiratory failure with hypoxia: Secondary | ICD-10-CM | POA: Diagnosis not present

## 2018-08-31 DIAGNOSIS — J9621 Acute and chronic respiratory failure with hypoxia: Secondary | ICD-10-CM | POA: Diagnosis not present

## 2018-08-31 DIAGNOSIS — K648 Other hemorrhoids: Secondary | ICD-10-CM | POA: Diagnosis not present

## 2018-08-31 DIAGNOSIS — Z823 Family history of stroke: Secondary | ICD-10-CM | POA: Diagnosis not present

## 2018-08-31 DIAGNOSIS — F419 Anxiety disorder, unspecified: Secondary | ICD-10-CM | POA: Diagnosis present

## 2018-08-31 DIAGNOSIS — R7303 Prediabetes: Secondary | ICD-10-CM | POA: Diagnosis not present

## 2018-08-31 DIAGNOSIS — Z833 Family history of diabetes mellitus: Secondary | ICD-10-CM

## 2018-08-31 DIAGNOSIS — R05 Cough: Secondary | ICD-10-CM | POA: Diagnosis not present

## 2018-08-31 DIAGNOSIS — R740 Nonspecific elevation of levels of transaminase and lactic acid dehydrogenase [LDH]: Secondary | ICD-10-CM | POA: Diagnosis not present

## 2018-08-31 DIAGNOSIS — E876 Hypokalemia: Secondary | ICD-10-CM | POA: Diagnosis not present

## 2018-08-31 DIAGNOSIS — K649 Unspecified hemorrhoids: Secondary | ICD-10-CM

## 2018-08-31 DIAGNOSIS — E669 Obesity, unspecified: Secondary | ICD-10-CM | POA: Diagnosis not present

## 2018-08-31 DIAGNOSIS — Z79899 Other long term (current) drug therapy: Secondary | ICD-10-CM

## 2018-08-31 DIAGNOSIS — K219 Gastro-esophageal reflux disease without esophagitis: Secondary | ICD-10-CM | POA: Diagnosis present

## 2018-08-31 DIAGNOSIS — D649 Anemia, unspecified: Secondary | ICD-10-CM | POA: Diagnosis present

## 2018-08-31 DIAGNOSIS — U071 COVID-19: Secondary | ICD-10-CM | POA: Diagnosis not present

## 2018-08-31 DIAGNOSIS — E1165 Type 2 diabetes mellitus with hyperglycemia: Secondary | ICD-10-CM | POA: Diagnosis not present

## 2018-08-31 DIAGNOSIS — K625 Hemorrhage of anus and rectum: Secondary | ICD-10-CM | POA: Diagnosis present

## 2018-08-31 DIAGNOSIS — R079 Chest pain, unspecified: Secondary | ICD-10-CM | POA: Diagnosis not present

## 2018-08-31 DIAGNOSIS — R0602 Shortness of breath: Secondary | ICD-10-CM | POA: Diagnosis not present

## 2018-08-31 LAB — FERRITIN: Ferritin: 338 ng/mL — ABNORMAL HIGH (ref 11–307)

## 2018-08-31 LAB — CULTURE, BLOOD (ROUTINE X 2)
Culture: NO GROWTH
Culture: NO GROWTH

## 2018-08-31 LAB — URINALYSIS, ROUTINE W REFLEX MICROSCOPIC
Bilirubin Urine: NEGATIVE
Glucose, UA: NEGATIVE mg/dL
Hgb urine dipstick: NEGATIVE
Ketones, ur: 5 mg/dL — AB
Leukocytes,Ua: NEGATIVE
Nitrite: NEGATIVE
Protein, ur: NEGATIVE mg/dL
Specific Gravity, Urine: 1.006 (ref 1.005–1.030)
pH: 6 (ref 5.0–8.0)

## 2018-08-31 LAB — CBC WITH DIFFERENTIAL/PLATELET
Abs Immature Granulocytes: 0.04 10*3/uL (ref 0.00–0.07)
Basophils Absolute: 0 10*3/uL (ref 0.0–0.1)
Basophils Relative: 0 %
Eosinophils Absolute: 0.1 10*3/uL (ref 0.0–0.5)
Eosinophils Relative: 1 %
HCT: 35.8 % — ABNORMAL LOW (ref 36.0–46.0)
Hemoglobin: 11.7 g/dL — ABNORMAL LOW (ref 12.0–15.0)
Immature Granulocytes: 1 %
Lymphocytes Relative: 24 %
Lymphs Abs: 1.5 10*3/uL (ref 0.7–4.0)
MCH: 29.3 pg (ref 26.0–34.0)
MCHC: 32.7 g/dL (ref 30.0–36.0)
MCV: 89.7 fL (ref 80.0–100.0)
Monocytes Absolute: 0.4 10*3/uL (ref 0.1–1.0)
Monocytes Relative: 6 %
Neutro Abs: 4.3 10*3/uL (ref 1.7–7.7)
Neutrophils Relative %: 68 %
Platelets: 385 10*3/uL (ref 150–400)
RBC: 3.99 MIL/uL (ref 3.87–5.11)
RDW: 14.6 % (ref 11.5–15.5)
WBC: 6.3 10*3/uL (ref 4.0–10.5)
nRBC: 0 % (ref 0.0–0.2)

## 2018-08-31 LAB — TRIGLYCERIDES: Triglycerides: 185 mg/dL — ABNORMAL HIGH (ref <150)

## 2018-08-31 LAB — COMPREHENSIVE METABOLIC PANEL
ALT: 91 U/L — ABNORMAL HIGH (ref 0–44)
AST: 58 U/L — ABNORMAL HIGH (ref 15–41)
Albumin: 3.2 g/dL — ABNORMAL LOW (ref 3.5–5.0)
Alkaline Phosphatase: 51 U/L (ref 38–126)
Anion gap: 14 (ref 5–15)
BUN: 9 mg/dL (ref 6–20)
CO2: 24 mmol/L (ref 22–32)
Calcium: 8.7 mg/dL — ABNORMAL LOW (ref 8.9–10.3)
Chloride: 100 mmol/L (ref 98–111)
Creatinine, Ser: 0.9 mg/dL (ref 0.44–1.00)
GFR calc Af Amer: >60 mL/min (ref >60)
GFR calc non Af Amer: >60 mL/min (ref >60)
Glucose, Bld: 109 mg/dL — ABNORMAL HIGH (ref 70–99)
Potassium: 3.4 mmol/L — ABNORMAL LOW (ref 3.5–5.1)
Sodium: 138 mmol/L (ref 135–145)
Total Bilirubin: 0.7 mg/dL (ref 0.3–1.2)
Total Protein: 8.3 g/dL — ABNORMAL HIGH (ref 6.5–8.1)

## 2018-08-31 LAB — LACTATE DEHYDROGENASE: LDH: 284 U/L — ABNORMAL HIGH (ref 98–192)

## 2018-08-31 LAB — CK: Total CK: 91 U/L (ref 38–234)

## 2018-08-31 LAB — C-REACTIVE PROTEIN: CRP: 15.3 mg/dL — ABNORMAL HIGH (ref <1.0)

## 2018-08-31 LAB — FIBRINOGEN: Fibrinogen: 745 mg/dL — ABNORMAL HIGH (ref 210–475)

## 2018-08-31 LAB — POC OCCULT BLOOD, ED: Fecal Occult Bld: POSITIVE — AB

## 2018-08-31 LAB — I-STAT BETA HCG BLOOD, ED (MC, WL, AP ONLY): I-stat hCG, quantitative: <5 m[IU]/mL (ref <5)

## 2018-08-31 LAB — CBG MONITORING, ED: Glucose-Capillary: 88 mg/dL (ref 70–99)

## 2018-08-31 LAB — PROCALCITONIN: Procalcitonin: <0.1 ng/mL

## 2018-08-31 LAB — ABO/RH: ABO/RH(D): A POS

## 2018-08-31 LAB — D-DIMER, QUANTITATIVE: D-Dimer, Quant: 0.98 ug/mL-FEU — ABNORMAL HIGH (ref 0.00–0.50)

## 2018-08-31 LAB — LACTIC ACID, PLASMA: Lactic Acid, Venous: 1.1 mmol/L (ref 0.5–1.9)

## 2018-08-31 MED ORDER — POTASSIUM CHLORIDE CRYS ER 20 MEQ PO TBCR
40.0000 meq | EXTENDED_RELEASE_TABLET | Freq: Once | ORAL | Status: AC
  Administered 2018-08-31: 40 meq via ORAL
  Filled 2018-08-31: qty 2

## 2018-08-31 MED ORDER — SODIUM CHLORIDE 0.9 % IV SOLN
Freq: Once | INTRAVENOUS | Status: AC
  Administered 2018-08-31: 20:00:00 via INTRAVENOUS

## 2018-08-31 MED ORDER — INSULIN ASPART 100 UNIT/ML ~~LOC~~ SOLN
0.0000 [IU] | Freq: Every day | SUBCUTANEOUS | Status: DC
  Administered 2018-09-01 – 2018-09-04 (×2): 2 [IU] via SUBCUTANEOUS
  Administered 2018-09-05: 3 [IU] via SUBCUTANEOUS

## 2018-08-31 MED ORDER — SODIUM CHLORIDE 0.9 % IV BOLUS
1000.0000 mL | Freq: Once | INTRAVENOUS | Status: AC
  Administered 2018-08-31: 1000 mL via INTRAVENOUS

## 2018-08-31 MED ORDER — ONDANSETRON HCL 4 MG/2ML IJ SOLN: 4.0000 mg | Freq: Four times a day (QID) | INTRAMUSCULAR | Status: DC | PRN

## 2018-08-31 MED ORDER — INSULIN ASPART 100 UNIT/ML ~~LOC~~ SOLN
0.0000 [IU] | Freq: Three times a day (TID) | SUBCUTANEOUS | Status: DC
  Administered 2018-09-02: 3 [IU] via SUBCUTANEOUS
  Administered 2018-09-02: 5 [IU] via SUBCUTANEOUS
  Administered 2018-09-02 – 2018-09-03 (×2): 3 [IU] via SUBCUTANEOUS
  Administered 2018-09-03: 2 [IU] via SUBCUTANEOUS
  Administered 2018-09-03: 3 [IU] via SUBCUTANEOUS
  Administered 2018-09-04: 8 [IU] via SUBCUTANEOUS
  Administered 2018-09-04: 11 [IU] via SUBCUTANEOUS
  Administered 2018-09-04: 3 [IU] via SUBCUTANEOUS
  Administered 2018-09-05: 5 [IU] via SUBCUTANEOUS
  Administered 2018-09-05: 11 [IU] via SUBCUTANEOUS
  Administered 2018-09-06: 2 [IU] via SUBCUTANEOUS

## 2018-08-31 MED ORDER — ONDANSETRON HCL 4 MG PO TABS
4.0000 mg | ORAL_TABLET | Freq: Four times a day (QID) | ORAL | Status: DC | PRN
  Administered 2018-09-02: 4 mg via ORAL
  Filled 2018-08-31: qty 1

## 2018-08-31 MED ORDER — ORAL CARE MOUTH RINSE
15.0000 mL | Freq: Two times a day (BID) | OROMUCOSAL | Status: DC
  Administered 2018-09-01 – 2018-09-06 (×11): 15 mL via OROMUCOSAL

## 2018-08-31 MED ORDER — ENSURE ENLIVE PO LIQD
237.0000 mL | Freq: Two times a day (BID) | ORAL | Status: DC
  Administered 2018-09-01 – 2018-09-06 (×10): 237 mL via ORAL

## 2018-08-31 MED ORDER — ALBUTEROL SULFATE HFA 108 (90 BASE) MCG/ACT IN AERS
8.0000 | INHALATION_SPRAY | Freq: Once | RESPIRATORY_TRACT | Status: AC
  Administered 2018-08-31: 8 via RESPIRATORY_TRACT
  Filled 2018-08-31: qty 6.7

## 2018-08-31 MED ORDER — ENOXAPARIN SODIUM 40 MG/0.4ML ~~LOC~~ SOLN
40.0000 mg | SUBCUTANEOUS | Status: DC
  Administered 2018-08-31: 40 mg via SUBCUTANEOUS
  Filled 2018-08-31: qty 0.4

## 2018-08-31 MED ORDER — HYDROCHLOROTHIAZIDE 12.5 MG PO CAPS
12.5000 mg | ORAL_CAPSULE | Freq: Every day | ORAL | Status: DC
  Filled 2018-08-31: qty 1

## 2018-08-31 MED ORDER — LISINOPRIL 5 MG PO TABS
10.0000 mg | ORAL_TABLET | Freq: Every day | ORAL | Status: DC
  Filled 2018-08-31: qty 1

## 2018-08-31 MED ORDER — PANTOPRAZOLE SODIUM 40 MG IV SOLR
40.0000 mg | Freq: Two times a day (BID) | INTRAVENOUS | Status: DC
  Administered 2018-08-31 – 2018-09-01 (×2): 40 mg via INTRAVENOUS
  Filled 2018-08-31 (×2): qty 40

## 2018-08-31 MED ORDER — PANTOPRAZOLE SODIUM 40 MG IV SOLR
40.0000 mg | Freq: Once | INTRAVENOUS | Status: AC
  Administered 2018-08-31: 40 mg via INTRAVENOUS
  Filled 2018-08-31: qty 40

## 2018-08-31 MED ORDER — LISINOPRIL-HYDROCHLOROTHIAZIDE 10-12.5 MG PO TABS: 1.0000 | ORAL_TABLET | Freq: Every day | ORAL | Status: DC

## 2018-08-31 MED ORDER — ACETAMINOPHEN 325 MG PO TABS
650.0000 mg | ORAL_TABLET | Freq: Four times a day (QID) | ORAL | Status: DC | PRN
  Administered 2018-09-03: 650 mg via ORAL
  Filled 2018-08-31: qty 2

## 2018-08-31 MED ORDER — AEROCHAMBER PLUS FLO-VU MEDIUM MISC: 1.0000 | Freq: Once | Status: DC

## 2018-08-31 MED ORDER — HYDROCHLOROTHIAZIDE 25 MG PO TABS
12.5000 mg | ORAL_TABLET | Freq: Every day | ORAL | Status: DC
  Administered 2018-09-01: 12.5 mg via ORAL
  Filled 2018-08-31: qty 0.5

## 2018-08-31 MED ORDER — LISINOPRIL 5 MG PO TABS
10.0000 mg | ORAL_TABLET | Freq: Every day | ORAL | Status: DC
  Administered 2018-09-01 – 2018-09-06 (×6): 10 mg via ORAL
  Filled 2018-08-31 (×6): qty 2

## 2018-08-31 NOTE — ED Notes (Signed)
Bed: GJ15 Expected date:  Expected time:  Means of arrival:  Comments: EMS-Covid pos-nonrebreather

## 2018-08-31 NOTE — ED Notes (Signed)
[facility] has been notified regarding patient transport.

## 2018-08-31 NOTE — ED Notes (Signed)
ED TO INPATIENT HANDOFF REPORT  ED Nurse Name and Phone #: [phone]  S Name/Age/Gender Deborah Haney 53 y.o. female Room/Bed: WA12/WA12  Code Status   Code Status: Prior  Home/SNF/Other Home Patient oriented to: self, place, time and situation Is this baseline? Yes   Triage Complete: Triage complete  Chief Complaint --  Triage Note Transported by [facility] from home-- recently tested positive for COVID19 2 weeks ago; presents today with shob, productive cough and fever x 2 days. EMS had to place patient on NRB as RA sats were 88%.   Vitals: 122/68 98 HR 20 RR 100 % with NRB    Allergies No Known Allergies  Level of Care/Admitting Diagnosis ED Disposition    ED Disposition Condition [facility] Area: [place] [[id number]]  Level of Care: Med-Surg [16]  Covid Evaluation: Confirmed COVID Positive  Isolation Risk Level: Low Risk/Droplet (Less than 4L [place] supplementation)  Diagnosis: COVID-19 virus infection [[id number]]  Admitting Physician: Darliss Cheney [[id number]]  Attending Physician: Darliss Cheney [phone]  Estimated length of stay: 3 - 4 days  Certification:: I certify this patient will need inpatient services for at least 2 midnights  PT Class (Do Not Modify): Inpatient [101]  PT Acc Code (Do Not Modify): Private [1]       B Medical/Surgery History Past Medical History:  Diagnosis Date  . Acute cholecystitis 07/14/2013  . Anemia    has history, takes iron  . Anxiety   . Bipolar affective disorder ([place]) 10/26/2015   Has seen counselor, declined medications per medical record from [facility]. hypermanic  . Chest pain 07/14/2013  . Chronic vaginitis 10/26/2015   Has h/o vag dryness and tried Estrace per [facility] records  . Depression   . Diabetes ([place])   . Diverticulitis    no problems  . Dysfunctional uterine bleeding 03/02/2011  . Elevated hemoglobin A1c 10/26/2015  . GERD (gastroesophageal reflux disease)    tums prn  .  Headache(784.0)   . Hemorrhoids   . HTN (hypertension)    BP meds since fall 2016  . Internal hemorrhoids   . Nausea & vomiting 07/14/2013  . Obesity 10/26/2015  . Rectal bleeding 03/15/2017   Past Surgical History:  Procedure Laterality Date  . CHOLECYSTECTOMY N/A 07/15/2013   Procedure: LAPAROSCOPIC CHOLECYSTECTOMY;  Surgeon: Rolm Bookbinder, MD;  Location: [facility];  Service: General;  Laterality: N/A;  . COLONOSCOPY    . ECTOPIC PREGNANCY SURGERY     laparotomy  . ROBOTIC ASSISTED LAP VAGINAL HYSTERECTOMY       A IV Location/Drains/Wounds Patient Lines/Drains/Airways Status   Active Line/Drains/Airways    Name:   Placement date:   Placement time:   Site:   Days:   Peripheral IV 08/31/18 Right Antecubital   08/31/18    0951    Antecubital   less than 1          Intake/Output Last 24 hours No intake or output data in the 24 hours ending 08/31/18 1450  Labs/Imaging Results for orders placed or performed during the hospital encounter of 08/31/18 (from the past 48 hour(s))  Ferritin     Status: Abnormal   Collection Time: 08/31/18  9:43 AM  Result Value Ref Range   Ferritin 338 (H) 11 - 307 ng/mL    Comment: Performed at [facility], [place] [address] [place], [place] [address]  Triglycerides     Status: Abnormal   Collection Time: 08/31/18  9:43 AM  Result Value Ref Range   Triglycerides 185 (H) <150 mg/dL    Comment: Performed at [facility], [place] [address] [place], [place] [address]  C-reactive protein     Status: Abnormal   Collection Time: 08/31/18  9:43 AM  Result Value Ref Range   CRP 15.3 (H) <1.0 mg/dL    Comment: Performed at [facility], [place] [address], [place], [place] [address]  Lactic acid, plasma     Status: None   Collection Time: 08/31/18  9:50 AM  Result Value Ref Range   Lactic Acid, Venous 1.1 0.5 - 1.9 mmol/L    Comment: Performed at [facility], [place] [address],  [place], [place] [address]  CBC WITH DIFFERENTIAL     Status: Abnormal   Collection Time: 08/31/18  9:50 AM  Result Value Ref Range   WBC 6.3 4.0 - 10.5 K/uL   RBC 3.99 3.87 - 5.11 MIL/uL   Hemoglobin 11.7 (L) 12.0 - 15.0 g/dL   HCT 35.8 (L) 36.0 - 46.0 %   MCV 89.7 80.0 - 100.0 fL   MCH 29.3 26.0 - 34.0 pg   MCHC 32.7 30.0 - 36.0 g/dL   RDW 14.6 11.5 - 15.5 %   Platelets 385 150 - 400 K/uL   nRBC 0.0 0.0 - 0.2 %   Neutrophils Relative % 68 %   Neutro Abs 4.3 1.7 - 7.7 K/uL   Lymphocytes Relative 24 %   Lymphs Abs 1.5 0.7 - 4.0 K/uL   Monocytes Relative 6 %   Monocytes Absolute 0.4 0.1 - 1.0 K/uL   Eosinophils Relative 1 %   Eosinophils Absolute 0.1 0.0 - 0.5 K/uL   Basophils Relative 0 %   Basophils Absolute 0.0 0.0 - 0.1 K/uL   Immature Granulocytes 1 %   Abs Immature Granulocytes 0.04 0.00 - 0.07 K/uL    Comment: Performed at [facility], [place] [address] [place], [place] [address]  Comprehensive metabolic panel     Status: Abnormal   Collection Time: 08/31/18  9:50 AM  Result Value Ref Range   Sodium 138 135 - 145 mmol/L   Potassium 3.4 (L) 3.5 - 5.1 mmol/L   Chloride 100 98 - 111 mmol/L   CO2 24 22 - 32 mmol/L   Glucose, Bld 109 (H) 70 - 99 mg/dL   BUN 9 6 - 20 mg/dL   Creatinine, Ser 0.90 0.44 - 1.00 mg/dL   Calcium 8.7 (L) 8.9 - 10.3 mg/dL   Total Protein 8.3 (H) 6.5 - 8.1 g/dL   Albumin 3.2 (L) 3.5 - 5.0 g/dL   AST 58 (H) 15 - 41 U/L   ALT 91 (H) 0 - 44 U/L   Alkaline Phosphatase 51 38 - 126 U/L   Total Bilirubin 0.7 0.3 - 1.2 mg/dL   GFR calc non Af Amer >60 >60 mL/min   GFR calc Af Amer >60 >60 mL/min   Anion gap 14 5 - 15    Comment: Performed at [facility], [place] [address] [place], [place] [address]  D-dimer, quantitative     Status: Abnormal   Collection Time: 08/31/18  9:50 AM  Result Value Ref Range   D-Dimer, Quant 0.98 (H) 0.00 - 0.50 ug/mL-FEU    Comment: (NOTE) At the manufacturer cut-off of 0.50 ug/mL FEU, this  assay has been documented to exclude PE with a sensitivity and negative predictive value of 97 to 99%.  At this time, this  assay has not been approved by the FDA to exclude DVT/VTE. Results should be correlated with clinical presentation. Performed at [facility], [place] [address], [place], [place] [address]   Procalcitonin     Status: None   Collection Time: 08/31/18  9:50 AM  Result Value Ref Range   Procalcitonin <0.10 ng/mL    Comment:        Interpretation: PCT (Procalcitonin) <= 0.5 ng/mL: Systemic infection (sepsis) is not likely. Local bacterial infection is possible. (NOTE)       Sepsis PCT Algorithm           Lower Respiratory Tract                                      Infection PCT Algorithm    ----------------------------     ----------------------------         PCT < 0.25 ng/mL                PCT < 0.10 ng/mL         Strongly encourage             Strongly discourage   discontinuation of antibiotics    initiation of antibiotics    ----------------------------     -----------------------------       PCT 0.25 - 0.50 ng/mL            PCT 0.10 - 0.25 ng/mL               OR       >80% decrease in PCT            Discourage initiation of                                            antibiotics      Encourage discontinuation           of antibiotics    ----------------------------     -----------------------------         PCT >= 0.50 ng/mL              PCT 0.26 - 0.50 ng/mL               AND        <80% decrease in PCT             Encourage initiation of                                             antibiotics       Encourage continuation           of antibiotics    ----------------------------     -----------------------------        PCT >= 0.50 ng/mL                  PCT > 0.50 ng/mL               AND         increase in PCT                  Strongly encourage  initiation of antibiotics    Strongly encourage  escalation           of antibiotics                                     -----------------------------                                           PCT <= 0.25 ng/mL                                                 OR                                        > 80% decrease in PCT                                     Discontinue / Do not initiate                                             antibiotics Performed at [place] [address] [place], [place] [address]   Lactate dehydrogenase     Status: Abnormal   Collection Time: 08/31/18  9:50 AM  Result Value Ref Range   LDH 284 (H) 98 - 192 U/L    Comment: Performed at [facility], [place] [address] [place], [place] [address]  Fibrinogen     Status: Abnormal   Collection Time: 08/31/18  9:50 AM  Result Value Ref Range   Fibrinogen 745 (H) 210 - 475 mg/dL    Comment: Performed at [facility], [place] [address] [place], [place] [address]  CK     Status: None   Collection Time: 08/31/18  9:50 AM  Result Value Ref Range   Total CK 91 38 - 234 U/L    Comment: Performed at [facility], [place] [address], [place], [place] [address]  I-Stat beta hCG blood, ED     Status: None   Collection Time: 08/31/18 10:02 AM  Result Value Ref Range   I-stat hCG, quantitative <5.0 <5 mIU/mL   Comment 3            Comment:   GEST. AGE      CONC.  (mIU/mL)   <=1 WEEK        5 - 50     2 WEEKS       50 - 500     3 WEEKS       100 - 10,000     4 WEEKS     1,000 - 30,000        FEMALE AND NON-PREGNANT FEMALE:     LESS THAN 5 mIU/mL   POC occult blood, ED Provider will collect     Status: Abnormal   Collection Time: 08/31/18 10:02 AM  Result  Value Ref Range   Fecal Occult Bld POSITIVE (A) NEGATIVE   Dg Chest Port 1 View  Result Date: 08/31/2018 CLINICAL DATA:  Shortness of breath, productive cough, fever. EXAM: PORTABLE CHEST 1 VIEW COMPARISON:  Radiograph of August 26, 2018.  FINDINGS: Stable cardiomediastinal silhouette. No pneumothorax is noted. Increased bibasilar opacities are noted concerning for worsening infiltrates or pneumonia with possible small pleural effusions. Bony thorax is unremarkable. IMPRESSION: Increased bibasilar opacities are noted concerning for worsening infiltrates or pneumonia. Electronically Signed   By: Marijo Conception M.D.   On: 08/31/2018 10:44    Pending Labs Unresulted Labs (From admission, onward)    Start     Ordered   08/31/18 0955  Urinalysis, Routine w reflex microscopic  Once,   STAT     08/31/18 0954   08/31/18 0943  Blood Culture (routine x 2)  BLOOD CULTURE X 2,   STAT     08/31/18 0946   Signed and Held  ABO/Rh  Once,   R     Signed and Held   Signed and Held  CBC  (enoxaparin (LOVENOX)    CrCl >/= 30 ml/min)  Once,   R    Comments: Baseline for enoxaparin therapy IF NOT ALREADY DRAWN.  Notify MD if PLT < 100 K.    Signed and Held   Signed and Held  Creatinine, serum  (enoxaparin (LOVENOX)    CrCl >/= 30 ml/min)  Once,   R    Comments: Baseline for enoxaparin therapy IF NOT ALREADY DRAWN.    Signed and Held   Signed and Held  Creatinine, serum  (enoxaparin (LOVENOX)    CrCl >/= 30 ml/min)  Weekly,   R    Comments: while on enoxaparin therapy    Signed and Held   Signed and Held  C-reactive protein  Once,   R     Signed and Held   Signed and Held  D-dimer, quantitative (not at [facility])  Once,   R     Signed and Held   Signed and Held  Ferritin  Once,   R     Signed and Held   Signed and Held  Lactate dehydrogenase  Once,   R     Signed and Held   Visual merchandiser and Held  Procalcitonin  Once,   R     Signed and Held   Signed and Held  Sedimentation rate  Once,   R     Signed and Held   Signed and Held  CBC with Differential/Platelet  Daily,   R     Signed and Held   Visual merchandiser and Held  Comprehensive metabolic panel  Daily,   R     Signed and Held   Signed and Held  CK  Daily,   R     Signed and Held   Signed and Held   Magnesium  Daily,   R     Signed and Held   Signed and Held  Strep pneumoniae urinary antigen  Once,   R     Signed and Held   Signed and Held  Hemoglobin and hematocrit, blood  Once,   R     Signed and Held          Vitals/Pain Today's Vitals   08/31/18 1230 08/31/18 1245 08/31/18 1328 08/31/18 1345  BP: 122/79 122/80 122/83 119/80  Pulse: 89 94 95 89  Resp: (!) 31 (!) 36 16 (!) 25  Temp:  TempSrc:      SpO2: 94% 93% 96% 95%  Weight:      Height:        Isolation Precautions Airborne and Contact precautions  Medications Medications  0.9 %  sodium chloride infusion (has no administration in time range)  AeroChamber Plus Flo-Vu Medium MISC 1 each (has no administration in time range)  potassium chloride SA (K-DUR) CR tablet 40 mEq (has no administration in time range)  sodium chloride 0.9 % bolus 1,000 mL (1,000 mLs Intravenous New Bag/Given 08/31/18 0951)  albuterol (VENTOLIN HFA) 108 (90 Base) MCG/ACT inhaler 8 puff (8 puffs Inhalation Given 08/31/18 0951)  pantoprazole (PROTONIX) injection 40 mg (40 mg Intravenous Given 08/31/18 1118)    Mobility walks Low fall risk   Focused Assessments Pulmonary Assessment Handoff:  Lung sounds:   O2 Device: Room Air O2 Flow Rate (L/min): 3 L/min      R Recommendations: See Admitting Provider Note  Report given to:   Additional Notes: Patient is on O2- 4 L

## 2018-08-31 NOTE — ED Notes (Signed)
ED Provider at bedside. 

## 2018-08-31 NOTE — ED Provider Notes (Signed)
[facility] DEPT Provider Note   CSN: [id number] Arrival date & time: 08/31/18  6160    History   Chief Complaint Chief Complaint  Patient presents with  . Shortness of Breath    HPI Deborah Haney is a 53 y.o. female.     HPI   Pt is a 53 y/o female with a h/o anemia, bipolar affective disorder, anxiety/depression, diverticulitis, GERD, hemorrhoids, hypertension, rectal bleeding, who presents to the ED today for eval of shortness of breath that has been present since she was diagnosed with COVID 2 weeks ago but seems to be worsening. She reports associated productive cough. Has chest pain with cough only. No persistent chest pain. States she has inhalers at home however they have not been improving her sxs.   States she has had diarrhea today and had some blood in it. States she had 2 episodes of this. States she has had this in that past when taking tylenol. Has been taking Excedrin for several days. Unable to quantify how much. Denies abd pain or vomiting.   She is also concerned because she states her urine is dark and she has had cramping in her hands. Denies cramping elsewhere. Denies dysuria. Has had decreased PO intake, but has been trying to hydrate with water.  States she is having mild body aches and fevers.   Past Medical History:  Diagnosis Date  . Acute cholecystitis 07/14/2013  . Anemia    has history, takes iron  . Anxiety   . Bipolar affective disorder ([place]) 10/26/2015   Has seen counselor, declined medications per medical record from [facility]. hypermanic  . Chest pain 07/14/2013  . Chronic vaginitis 10/26/2015   Has h/o vag dryness and tried Estrace per [place] records  . Depression   . Diabetes ([place])   . Diverticulitis    no problems  . Dysfunctional uterine bleeding 03/02/2011  . Elevated hemoglobin A1c 10/26/2015  . GERD (gastroesophageal reflux disease)    tums prn  . Headache(784.0)   . Hemorrhoids   . HTN (hypertension)    BP meds since fall 2016  . Internal hemorrhoids   . Nausea & vomiting 07/14/2013  . Obesity 10/26/2015  . Rectal bleeding 03/15/2017    Patient Active Problem List   Diagnosis Date Noted  . Prediabetes 08/31/2018  . Hypokalemia 08/31/2018  . COVID-19 virus infection 08/24/2018  . Cough 06/12/2018  . SOB (shortness of breath) 06/12/2018  . Allergic rhinitis due to pollen 06/12/2018  . Body aches 06/12/2018  . Acute cystitis with hematuria 03/05/2018  . Dyslipidemia 06/05/2017  . Diabetes ([place])   . Hemorrhoids 03/15/2017  . Rectal bleeding 03/15/2017  . HTN (hypertension) 10/26/2015  . Obesity 10/26/2015  . Chronic vaginitis 10/26/2015  . Bipolar affective disorder ([place]) 10/26/2015  . Nausea & vomiting 07/14/2013  . Acute cholecystitis 07/14/2013  . Chest pain 07/14/2013  . Dysfunctional uterine bleeding 03/02/2011    Past Surgical History:  Procedure Laterality Date  . CHOLECYSTECTOMY N/A 07/15/2013   Procedure: LAPAROSCOPIC CHOLECYSTECTOMY;  Surgeon: Rolm Bookbinder, MD;  Location: [place];  Service: General;  Laterality: N/A;  . COLONOSCOPY    . ECTOPIC PREGNANCY SURGERY     laparotomy  . ROBOTIC ASSISTED LAP VAGINAL HYSTERECTOMY       OB History    Gravida  1   Para  0   Term  0   Preterm  0   AB  1   Living  0  SAB  0   TAB  0   Ectopic  1   Multiple  0   Live Births               Home Medications    Prior to Admission medications   Medication Sig Start Date End Date Taking? Authorizing Provider  Blood Glucose Monitoring Suppl (CONTOUR MONITOR) w/Device KIT 1 kit by Does not apply route 2 (two) times a day. 07/02/18   Henson, Vickie L, NP-C  glucose blood (CONTOUR NEXT TEST) test strip Test twice a day 07/02/18   Harland Dingwall L, NP-C  HYDROcodone-Chlorpheniramine 5-4 MG/5ML SOLN Take 5 mLs by mouth 2 times daily at 12 noon and 4 pm. 08/26/18   Dorie Rank, MD  lisinopril-hydrochlorothiazide (ZESTORETIC) 10-12.5 MG tablet Take 1 tablet by  mouth daily. 07/02/18   Girtha Rm, NP-C  Microlet Lancets MISC Test blood sugar twice a day 07/02/18   Henson, Vickie L, NP-C  mupirocin ointment (BACTROBAN) 2 % Apply thin layer to affected area 3 times daily x 7 days. Patient not taking: Reported on 08/24/2018 01/29/18   Harland Dingwall L, NP-C  ondansetron (ZOFRAN) 4 MG tablet Take 1 tablet (4 mg total) by mouth every 8 (eight) hours as needed for nausea or vomiting. 08/24/18   Margarita Mail, PA-C  promethazine-dextromethorphan (PROMETHAZINE-DM) 6.25-15 MG/5ML syrup Take 5 mLs by mouth every 6 (six) hours as needed for cough. 08/27/18   Girtha Rm, NP-C    Family History Family History  Problem Relation Age of Onset  . Diabetes Mother   . Hypertension Mother   . Arthritis Father   . Hypertension Brother   . Cancer Brother        "in his blood"  . Diabetes Brother        diet controlled  . Cancer Brother        unsure kind  . CVA Other   . Colon cancer Neg Hx   . Colon polyps Neg Hx   . Esophageal cancer Neg Hx   . Rectal cancer Neg Hx   . Stomach cancer Neg Hx     Social History Social History   Tobacco Use  . Smoking status: Former Smoker    Types: Cigarettes    Quit date: 04/18/2008    Years since quitting: 10.3  . Smokeless tobacco: Never Used  Substance Use Topics  . Alcohol use: Yes    Comment: 1 glass of wine rarely, occasionally  . Drug use: No    Types: Cocaine    Comment: Former cocaine use since 2012     Allergies   Patient has no known allergies.   Review of Systems Review of Systems  Constitutional: Positive for appetite change, chills, diaphoresis and fever.  HENT: Negative for ear pain.   Eyes: Negative for visual disturbance.  Respiratory: Positive for cough and shortness of breath.   Cardiovascular: Positive for chest pain (with cough only).  Gastrointestinal: Positive for blood in stool and diarrhea. Negative for abdominal pain, constipation, nausea and vomiting.  Genitourinary:  Negative for dysuria and hematuria.       Dark urine  Musculoskeletal: Positive for myalgias. Negative for back pain.       Hand cramps  Skin: Negative for rash.  Neurological: Negative for headaches.  All other systems reviewed and are negative.   Physical Exam Updated Vital Signs BP 127/80   Pulse 93   Temp 98.9 F (37.2 C) (Oral)   Resp (!) 34  Ht '5\' 6"'$  (1.676 m)   Wt 88.5 kg   LMP 04/25/2011   SpO2 93%   BMI 31.47 kg/m   Physical Exam Vitals signs and nursing note reviewed.  Constitutional:      General: She is not in acute distress.    Appearance: She is well-developed. She is ill-appearing.  HENT:     Head: Normocephalic and atraumatic.  Eyes:     Conjunctiva/sclera: Conjunctivae normal.  Neck:     Musculoskeletal: Neck supple.  Cardiovascular:     Rate and Rhythm: Normal rate and regular rhythm.     Pulses: Normal pulses.     Heart sounds: Normal heart sounds. No murmur.  Pulmonary:     Effort: Pulmonary effort is normal. Tachypnea present. No respiratory distress.     Breath sounds: Examination of the right-upper field reveals decreased breath sounds. Examination of the left-upper field reveals decreased breath sounds. Examination of the right-middle field reveals decreased breath sounds. Examination of the left-middle field reveals decreased breath sounds. Examination of the right-lower field reveals decreased breath sounds and rales. Examination of the left-lower field reveals decreased breath sounds and rales. Decreased breath sounds and rales present.     Comments: Speaking in short sentences. Productive cough on exam.  Abdominal:     General: Bowel sounds are normal.     Palpations: Abdomen is soft.     Tenderness: There is no abdominal tenderness. There is no guarding or rebound.  Genitourinary:    Comments: Chaperone present during DRE. Multiple external hemorrhoids present without obvious bleeding. Small amount of light brown stool mixed with red blood  present on DRE. Skin:    General: Skin is warm and dry.  Neurological:     Mental Status: She is alert.     ED Treatments / Results  Labs (all labs ordered are listed, but only abnormal results are displayed) Labs Reviewed  CBC WITH DIFFERENTIAL/PLATELET - Abnormal; Notable for the following components:      Result Value   Hemoglobin 11.7 (*)    HCT 35.8 (*)    All other components within normal limits  COMPREHENSIVE METABOLIC PANEL - Abnormal; Notable for the following components:   Potassium 3.4 (*)    Glucose, Bld 109 (*)    Calcium 8.7 (*)    Total Protein 8.3 (*)    Albumin 3.2 (*)    AST 58 (*)    ALT 91 (*)    All other components within normal limits  D-DIMER, QUANTITATIVE (NOT AT [facility]) - Abnormal; Notable for the following components:   D-Dimer, Quant 0.98 (*)    All other components within normal limits  LACTATE DEHYDROGENASE - Abnormal; Notable for the following components:   LDH 284 (*)    All other components within normal limits  FERRITIN - Abnormal; Notable for the following components:   Ferritin 338 (*)    All other components within normal limits  TRIGLYCERIDES - Abnormal; Notable for the following components:   Triglycerides 185 (*)    All other components within normal limits  FIBRINOGEN - Abnormal; Notable for the following components:   Fibrinogen 745 (*)    All other components within normal limits  C-REACTIVE PROTEIN - Abnormal; Notable for the following components:   CRP 15.3 (*)    All other components within normal limits  POC OCCULT BLOOD, ED - Abnormal; Notable for the following components:   Fecal Occult Bld POSITIVE (*)    All other components within normal limits  CULTURE, BLOOD (ROUTINE X 2)  CULTURE, BLOOD (ROUTINE X 2)  LACTIC ACID, PLASMA  PROCALCITONIN  CK  URINALYSIS, ROUTINE W REFLEX MICROSCOPIC  I-STAT BETA HCG BLOOD, ED ([facility], [facility], [facility] ONLY)    EKG None  Radiology Dg Chest Port 1 View  Result Date: 08/31/2018 CLINICAL  DATA:  Shortness of breath, productive cough, fever. EXAM: PORTABLE CHEST 1 VIEW COMPARISON:  Radiograph of August 26, 2018. FINDINGS: Stable cardiomediastinal silhouette. No pneumothorax is noted. Increased bibasilar opacities are noted concerning for worsening infiltrates or pneumonia with possible small pleural effusions. Bony thorax is unremarkable. IMPRESSION: Increased bibasilar opacities are noted concerning for worsening infiltrates or pneumonia. Electronically Signed   By: Marijo Conception M.D.   On: 08/31/2018 10:44    Procedures Procedures (including critical care time)  Medications Ordered in ED Medications  0.9 %  sodium chloride infusion (has no administration in time range)  AeroChamber Plus Flo-Vu Medium MISC 1 each (has no administration in time range)  potassium chloride SA (K-DUR) CR tablet 40 mEq (has no administration in time range)  sodium chloride 0.9 % bolus 1,000 mL (1,000 mLs Intravenous New Bag/Given 08/31/18 0951)  albuterol (VENTOLIN HFA) 108 (90 Base) MCG/ACT inhaler 8 puff (8 puffs Inhalation Given 08/31/18 0951)  pantoprazole (PROTONIX) injection 40 mg (40 mg Intravenous Given 08/31/18 1118)     Initial Impression / Assessment and Plan / ED Course  I have reviewed the triage vital signs and the nursing notes.  Pertinent labs & imaging results that were available during my care of the patient were reviewed by me and considered in my medical decision making (see chart for details).    Final Clinical Impressions(s) / ED Diagnoses   Final diagnoses:  COVID-19  Acute respiratory failure with hypoxia ([place])   53 year old female presenting for evaluation of shortness of breath.  She was diagnosed with COVID on 6/16.  Also stating she had 2 episodes of rectal bleeding.  On arrival she is hypoxic to 85% on room air.  She is initially placed on 3 L by triage staff however on my evaluation she is still satting at 91% on room air.  Increased patient to 4 L and satting at  96%.  Her vital signs are otherwise reassuring.  Decreased lung sounds throughout with faint rales to the bilateral lower lobes.  Abdomen soft and nontender.  DRE completed with small amount of light brown stool with red blood present.  External hemorrhoids present.  Hemoccult obtained.  Labs reveal: CBC without leukocytosis, mild anemia present. CMP with mild hypokalemia.  AST/ALT slightly elevated consistent with history of coronavirus.  Creatinine normal. Ddimer slightly elevated Procalcitonin negative LDH elevated, slightly worse from prior Ferritin elevated, slightly worse from prior Triglycerides worse from prior Fibrinogen worse from prior CRP elevated, worse from prior Beta hcg negative CK is negative Lactic is negative  Hemoccult positive.  Patient given a dose of PPI.  Low suspicion for complicated bleed with stable hemoglobin and only 2 episodes.  CXR with increased bibasilar opacities are noted concerning for worsening infiltrates or pneumonia.  Patient continues to be hypoxic after being diagnosed with COVID 10 days ago.  Will require admission for further treatment of acute hypoxic respiratory failure.  11:42 AM CONSULT With Dr. Doristine Bosworth who accepts pt for admission.   ED Discharge Orders    None       Rodney Booze, PA-C 08/31/18 1231    Little, Wenda Overland, MD 09/01/18 [phone]

## 2018-08-31 NOTE — ED Notes (Signed)
Patient's oxygen saturation on room air-- 85% per Herbie Baltimore, NT. Provider made aware, oxygen via nasal cannula applied at 3 L.

## 2018-08-31 NOTE — H&P (Addendum)
History and Physical    Deborah Haney LHT:[id number] DOB: 1966/02/02 DOA: 08/31/2018  PCP: Girtha Rm, NP-C  Patient coming from: Home  I have personally briefly reviewed patient's old medical records in [place]  Chief Complaint: Cough and shortness of breath  HPI: Deborah Haney is a 53 y.o. female with medical history significant of hypertension, obesity and prediabetes presented to ER with a complaint of worsening cough and shortness of breath.  Apparently patient started having new symptoms about 9 to 10 days ago when she was diagnosed COVID positive almost 9 days ago.  According to patient, she has been having same symptoms and also has been having some intermittent fever for last 2 days.  Symptoms have gotten worse so she came to the emergency department to seek medical care.  Patient also reports having 2 episodes of small to moderate amount of blood per rectum this morning.  She further tells me that she has a history of internal hemorrhoids and has had banding couple of times in the past with the last being 6 months ago and also had colonoscopy about a year ago however she does not remember the results of that.  ED Course: Upon arrival to the emergency department, she was slightly tachypneic and hypoxic requiring 3 L of oxygen.  CBC unremarkable with hemoglobin of 11.7.  CMP showed potassium of 3.4.  Lactic acid elevated.  Procalcitonin unremarkable.  Hospital service was consulted to admit the patient for further management.  Review of Systems: As per HPI otherwise 10 point review of systems negative.    Past Medical History:  Diagnosis Date  . Acute cholecystitis 07/14/2013  . Anemia    has history, takes iron  . Anxiety   . Bipolar affective disorder ([place]) 10/26/2015   Has seen counselor, declined medications per medical record from [facility]. hypermanic  . Chest pain 07/14/2013  . Chronic vaginitis 10/26/2015   Has h/o vag dryness and tried Estrace per [place] records   . Depression   . Diabetes ([place])   . Diverticulitis    no problems  . Dysfunctional uterine bleeding 03/02/2011  . Elevated hemoglobin A1c 10/26/2015  . GERD (gastroesophageal reflux disease)    tums prn  . Headache(784.0)   . Hemorrhoids   . HTN (hypertension)    BP meds since fall 2016  . Internal hemorrhoids   . Nausea & vomiting 07/14/2013  . Obesity 10/26/2015  . Rectal bleeding 03/15/2017    Past Surgical History:  Procedure Laterality Date  . CHOLECYSTECTOMY N/A 07/15/2013   Procedure: LAPAROSCOPIC CHOLECYSTECTOMY;  Surgeon: Rolm Bookbinder, MD;  Location: [place];  Service: General;  Laterality: N/A;  . COLONOSCOPY    . ECTOPIC PREGNANCY SURGERY     laparotomy  . ROBOTIC ASSISTED LAP VAGINAL HYSTERECTOMY       reports that she quit smoking about 10 years ago. Her smoking use included cigarettes. She has never used smokeless tobacco. She reports current alcohol use. She reports that she does not use drugs.  No Known Allergies  Family History  Problem Relation Age of Onset  . Diabetes Mother   . Hypertension Mother   . Arthritis Father   . Hypertension Brother   . Cancer Brother        "in his blood"  . Diabetes Brother        diet controlled  . Cancer Brother        unsure kind  . CVA Other   . Colon cancer  Neg Hx   . Colon polyps Neg Hx   . Esophageal cancer Neg Hx   . Rectal cancer Neg Hx   . Stomach cancer Neg Hx     Prior to Admission medications   Medication Sig Start Date End Date Taking? Authorizing Provider  Blood Glucose Monitoring Suppl (CONTOUR MONITOR) w/Device KIT 1 kit by Does not apply route 2 (two) times a day. 07/02/18   Henson, Vickie L, NP-C  glucose blood (CONTOUR NEXT TEST) test strip Test twice a day 07/02/18   Harland Dingwall L, NP-C  HYDROcodone-Chlorpheniramine 5-4 MG/5ML SOLN Take 5 mLs by mouth 2 times daily at 12 noon and 4 pm. 08/26/18   Dorie Rank, MD  lisinopril-hydrochlorothiazide (ZESTORETIC) 10-12.5 MG tablet Take 1 tablet by  mouth daily. 07/02/18   Girtha Rm, NP-C  Microlet Lancets MISC Test blood sugar twice a day 07/02/18   Henson, Vickie L, NP-C  mupirocin ointment (BACTROBAN) 2 % Apply thin layer to affected area 3 times daily x 7 days. Patient not taking: Reported on 08/24/2018 01/29/18   Harland Dingwall L, NP-C  ondansetron (ZOFRAN) 4 MG tablet Take 1 tablet (4 mg total) by mouth every 8 (eight) hours as needed for nausea or vomiting. 08/24/18   Margarita Mail, PA-C  promethazine-dextromethorphan (PROMETHAZINE-DM) 6.25-15 MG/5ML syrup Take 5 mLs by mouth every 6 (six) hours as needed for cough. 08/27/18   Girtha Rm, NP-C    Physical Exam: Vitals:   08/31/18 1045 08/31/18 1100 08/31/18 1115 08/31/18 1130  BP: 135/76 121/76 120/74 121/78  Pulse: 95 90 86 89  Resp: (!) 32 (!) 21 (!) 35 (!) 33  Temp:      TempSrc:      SpO2: 94% 97% 98% 96%  Weight:      Height:        Constitutional: NAD, calm, comfortable Vitals:   08/31/18 1045 08/31/18 1100 08/31/18 1115 08/31/18 1130  BP: 135/76 121/76 120/74 121/78  Pulse: 95 90 86 89  Resp: (!) 32 (!) 21 (!) 35 (!) 33  Temp:      TempSrc:      SpO2: 94% 97% 98% 96%  Weight:      Height:       Eyes: PERRL, lids and conjunctivae normal, sick appearing ENMT: Mucous membranes are moist. Posterior pharynx clear of any exudate or lesions.Normal dentition.  Neck: normal, supple, no masses, no thyromegaly Respiratory: clear to auscultation bilaterally, no wheezing, no crackles. Normal respiratory effort. No accessory muscle use.  Cardiovascular: Regular rate and rhythm, no murmurs / rubs / gallops. No extremity edema. 2+ pedal pulses. No carotid bruits.  Abdomen: no tenderness, no masses palpated. No hepatosplenomegaly. Bowel sounds positive.  Musculoskeletal: no clubbing / cyanosis. No joint deformity upper and lower extremities. Good ROM, no contractures. Normal muscle tone.  Skin: no rashes, lesions, ulcers. No induration Neurologic: CN 2-12 grossly  intact. Sensation intact, DTR normal. Strength 5/5 in all 4.  Psychiatric: Normal judgment and insight. Alert and oriented x 3. Normal mood.    Labs on Admission: I have personally reviewed following labs and imaging studies  CBC: Recent Labs  Lab 08/24/18 1230 08/26/18 1731 08/31/18 0950  WBC 4.2 3.5* 6.3  NEUTROABS 2.2 2.1 4.3  HGB 13.0 12.4 11.7*  HCT 41.0 38.9 35.8*  MCV 90.5 89.8 89.7  PLT 167 142* 482   Basic Metabolic Panel: Recent Labs  Lab 08/24/18 1251 08/26/18 1731 08/31/18 0950  NA 133* 135 138  K 3.4*  3.7 3.4*  CL 97* 98 100  CO2 _0 GLUCOSE 115* 105* 109*  BUN _1 CREATININE 0.96 0.85 0.90  CALCIUM 8.4* 8.7* 8.7*   GFR: Estimated Creatinine Clearance: 83.8 mL/min (by C-G formula based on SCr of 0.9 mg/dL). Liver Function Tests: Recent Labs  Lab 08/24/18 1251 08/26/18 1731 08/31/18 0950  AST 30 60* 58*  ALT 26 56* 91*  ALKPHOS 44 48 51  BILITOT 0.6 0.6 0.7  PROT 7.7 8.1 8.3*  ALBUMIN 3.5 3.7 3.2*   No results for input(s): LIPASE, AMYLASE in the last 168 hours. No results for input(s): AMMONIA in the last 168 hours. Coagulation Profile: No results for input(s): INR, PROTIME in the last 168 hours. Cardiac Enzymes: Recent Labs  Lab 08/31/18 0950  CKTOTAL 91   BNP (last 3 results) No results for input(s): PROBNP in the last 8760 hours. HbA1C: No results for input(s): HGBA1C in the last 72 hours. CBG: Recent Labs  Lab 08/24/18 1228  GLUCAP 110*   Lipid Profile: Recent Labs    08/31/18 0943  TRIG 185*   Thyroid Function Tests: No results for input(s): TSH, T4TOTAL, FREET4, T3FREE, THYROIDAB in the last 72 hours. Anemia Panel: Recent Labs    08/31/18 0943  FERRITIN 338*   Urine analysis:    Component Value Date/Time   COLORURINE YELLOW 08/24/2018 1411   APPEARANCEUR CLEAR 08/24/2018 1411   LABSPEC 1.011 08/24/2018 1411   LABSPEC 1.015 04/02/2018 1628   PHURINE 5.0 08/24/2018 1411   GLUCOSEU NEGATIVE 08/24/2018  1411   HGBUR NEGATIVE 08/24/2018 1411   BILIRUBINUR NEGATIVE 08/24/2018 1411   BILIRUBINUR negative 04/02/2018 1628   BILIRUBINUR n 01/18/2016 1611   KETONESUR 5 (A) 08/24/2018 1411   PROTEINUR NEGATIVE 08/24/2018 1411   UROBILINOGEN negative 01/18/2016 1611   UROBILINOGEN 0.2 07/14/2013 0253   NITRITE NEGATIVE 08/24/2018 1411   LEUKOCYTESUR NEGATIVE 08/24/2018 1411    Radiological Exams on Admission: Dg Chest Port 1 View  Result Date: 08/31/2018 CLINICAL DATA:  Shortness of breath, productive cough, fever. EXAM: PORTABLE CHEST 1 VIEW COMPARISON:  Radiograph of August 26, 2018. FINDINGS: Stable cardiomediastinal silhouette. No pneumothorax is noted. Increased bibasilar opacities are noted concerning for worsening infiltrates or pneumonia with possible small pleural effusions. Bony thorax is unremarkable. IMPRESSION: Increased bibasilar opacities are noted concerning for worsening infiltrates or pneumonia. Electronically Signed   By: Marijo Conception M.D.   On: 08/31/2018 10:44    EKG: Independently reviewed.  Sinus rhythm at 89 bpm with no acute ST-T wave changes.  Assessment/Plan Active Problems:   HTN (hypertension)   Hemorrhoids   Rectal bleeding   COVID-19 virus infection   Prediabetes   Hypokalemia    COVID-19 infection/viral pneumonia: Treat conservatively with oxygen supply, incentive spirometry.  We will check inflammatory markers including CRP, ESR, procalcitonin, d-dimer, LDH.  Consulted pharmacy to start patient on Remdesvir.  She will be admitted to [facility].  Hypertension: Will be resume home medications.  Rectal bleeding: As mentioned above, she has a history of internal hemorrhoids with banding done couple of times in the past with the recent one done 6 months ago.  Her hemoglobin is stable at 11.7.  This is likely hemorrhoidal bleeding.  Repeat hemoglobin at 5 PM.  Start on Protonix.  I do not think she needs any urgent intervention/colonoscopy at this point in time.   Prediabetes: Last hemoglobin A1c in chart is 6.4.  She is on diet control.  Will start on  sliding scale insulin.  Hypokalemia: 2.4.  Will replace orally now and recheck in the morning.  DVT prophylaxis: Lovenox Code Status: Full code Family Communication: None present at bedside.  Patient alert, oriented and competent.  Discussed in length about admission plans. Disposition Plan: Possibly home in next 2 to 3 days. Consults called: None Admission status: Inpatient   Darliss Cheney MD [facility] Pager [phone]  If 7PM-7AM, please contact night-coverage [email or web address] Password [facility]  08/31/2018, 12:16 PM

## 2018-08-31 NOTE — ED Triage Notes (Signed)
Transported by [facility] from home-- recently tested positive for COVID19 2 weeks ago; presents today with shob, productive cough and fever x 2 days. EMS had to place patient on NRB as RA sats were 88%.   Vitals: 122/68 98 HR 20 RR 100 % with NRB

## 2018-09-01 DIAGNOSIS — K625 Hemorrhage of anus and rectum: Secondary | ICD-10-CM

## 2018-09-01 DIAGNOSIS — J9601 Acute respiratory failure with hypoxia: Secondary | ICD-10-CM

## 2018-09-01 LAB — CBC WITH DIFFERENTIAL/PLATELET
Abs Immature Granulocytes: 0.02 10*3/uL (ref 0.00–0.07)
Basophils Absolute: 0 10*3/uL (ref 0.0–0.1)
Basophils Relative: 0 %
Eosinophils Absolute: 0.1 10*3/uL (ref 0.0–0.5)
Eosinophils Relative: 2 %
HCT: 30.6 % — ABNORMAL LOW (ref 36.0–46.0)
Hemoglobin: 10 g/dL — ABNORMAL LOW (ref 12.0–15.0)
Immature Granulocytes: 0 %
Lymphocytes Relative: 29 %
Lymphs Abs: 1.4 10*3/uL (ref 0.7–4.0)
MCH: 29.3 pg (ref 26.0–34.0)
MCHC: 32.7 g/dL (ref 30.0–36.0)
MCV: 89.7 fL (ref 80.0–100.0)
Monocytes Absolute: 0.4 10*3/uL (ref 0.1–1.0)
Monocytes Relative: 7 %
Neutro Abs: 2.9 10*3/uL (ref 1.7–7.7)
Neutrophils Relative %: 62 %
Platelets: 388 10*3/uL (ref 150–400)
RBC: 3.41 MIL/uL — ABNORMAL LOW (ref 3.87–5.11)
RDW: 14.6 % (ref 11.5–15.5)
WBC: 4.8 10*3/uL (ref 4.0–10.5)
nRBC: 0 % (ref 0.0–0.2)

## 2018-09-01 LAB — FERRITIN: Ferritin: 285 ng/mL (ref 11–307)

## 2018-09-01 LAB — COMPREHENSIVE METABOLIC PANEL
ALT: 71 U/L — ABNORMAL HIGH (ref 0–44)
AST: 50 U/L — ABNORMAL HIGH (ref 15–41)
Albumin: 2.8 g/dL — ABNORMAL LOW (ref 3.5–5.0)
Alkaline Phosphatase: 43 U/L (ref 38–126)
Anion gap: 19 — ABNORMAL HIGH (ref 5–15)
BUN: 7 mg/dL (ref 6–20)
CO2: 23 mmol/L (ref 22–32)
Calcium: 9.1 mg/dL (ref 8.9–10.3)
Chloride: 101 mmol/L (ref 98–111)
Creatinine, Ser: 0.78 mg/dL (ref 0.44–1.00)
GFR calc Af Amer: >60 mL/min (ref >60)
GFR calc non Af Amer: >60 mL/min (ref >60)
Glucose, Bld: 134 mg/dL — ABNORMAL HIGH (ref 70–99)
Potassium: 3.8 mmol/L (ref 3.5–5.1)
Sodium: 143 mmol/L (ref 135–145)
Total Bilirubin: 0.5 mg/dL (ref 0.3–1.2)
Total Protein: 7 g/dL (ref 6.5–8.1)

## 2018-09-01 LAB — LACTATE DEHYDROGENASE: LDH: 254 U/L — ABNORMAL HIGH (ref 98–192)

## 2018-09-01 LAB — GLUCOSE, CAPILLARY
Glucose-Capillary: 108 mg/dL — ABNORMAL HIGH (ref 70–99)
Glucose-Capillary: 104 mg/dL — ABNORMAL HIGH (ref 70–99)
Glucose-Capillary: 123 mg/dL — ABNORMAL HIGH (ref 70–99)
Glucose-Capillary: 204 mg/dL — ABNORMAL HIGH (ref 70–99)

## 2018-09-01 LAB — MAGNESIUM: Magnesium: 2.1 mg/dL (ref 1.7–2.4)

## 2018-09-01 LAB — D-DIMER, QUANTITATIVE: D-Dimer, Quant: 0.78 ug/mL-FEU — ABNORMAL HIGH (ref 0.00–0.50)

## 2018-09-01 LAB — C-REACTIVE PROTEIN: CRP: 10.1 mg/dL — ABNORMAL HIGH (ref <1.0)

## 2018-09-01 MED ORDER — SODIUM CHLORIDE 0.9 % IV SOLN
200.0000 mg | Freq: Once | INTRAVENOUS | Status: AC
  Administered 2018-09-01: 200 mg via INTRAVENOUS
  Filled 2018-09-01: qty 40

## 2018-09-01 MED ORDER — PANTOPRAZOLE SODIUM 40 MG PO TBEC
40.0000 mg | DELAYED_RELEASE_TABLET | Freq: Every day | ORAL | Status: DC
  Administered 2018-09-02 – 2018-09-05 (×4): 40 mg via ORAL
  Filled 2018-09-01 (×5): qty 1

## 2018-09-01 MED ORDER — ENOXAPARIN SODIUM 40 MG/0.4ML ~~LOC~~ SOLN
40.0000 mg | SUBCUTANEOUS | Status: DC
  Administered 2018-09-02 – 2018-09-06 (×5): 40 mg via SUBCUTANEOUS
  Filled 2018-09-01 (×5): qty 0.4

## 2018-09-01 MED ORDER — ZINC SULFATE 220 (50 ZN) MG PO CAPS
220.0000 mg | ORAL_CAPSULE | Freq: Every day | ORAL | Status: DC
  Administered 2018-09-01 – 2018-09-06 (×6): 220 mg via ORAL
  Filled 2018-09-01 (×7): qty 1

## 2018-09-01 MED ORDER — HYDROCORTISONE (PERIANAL) 2.5 % EX CREA
TOPICAL_CREAM | Freq: Three times a day (TID) | CUTANEOUS | Status: DC
  Administered 2018-09-01: 1 via RECTAL
  Administered 2018-09-01 – 2018-09-02 (×2): via RECTAL
  Administered 2018-09-02: 1 via RECTAL
  Administered 2018-09-02 – 2018-09-06 (×9): via RECTAL
  Filled 2018-09-01: qty 28.35

## 2018-09-01 MED ORDER — HYDROCORTISONE ACETATE 25 MG RE SUPP
25.0000 mg | Freq: Two times a day (BID) | RECTAL | Status: DC
  Administered 2018-09-01 – 2018-09-06 (×9): 25 mg via RECTAL
  Filled 2018-09-01 (×11): qty 1

## 2018-09-01 MED ORDER — VITAMIN C 500 MG PO TABS
500.0000 mg | ORAL_TABLET | Freq: Every day | ORAL | Status: DC
  Administered 2018-09-01 – 2018-09-06 (×6): 500 mg via ORAL
  Filled 2018-09-01 (×7): qty 1

## 2018-09-01 MED ORDER — METHYLPREDNISOLONE SODIUM SUCC 40 MG IJ SOLR
40.0000 mg | Freq: Two times a day (BID) | INTRAMUSCULAR | Status: DC
  Administered 2018-09-01 – 2018-09-04 (×7): 40 mg via INTRAVENOUS
  Filled 2018-09-01 (×7): qty 1

## 2018-09-01 MED ORDER — ALBUTEROL SULFATE HFA 108 (90 BASE) MCG/ACT IN AERS: 2.0000 | INHALATION_SPRAY | RESPIRATORY_TRACT | Status: DC | PRN

## 2018-09-01 MED ORDER — SODIUM CHLORIDE 0.9 % IV SOLN
100.0000 mg | INTRAVENOUS | Status: AC
  Administered 2018-09-02 – 2018-09-05 (×4): 100 mg via INTRAVENOUS
  Filled 2018-09-01 (×4): qty 20

## 2018-09-01 NOTE — Care Management (Signed)
Case manager will continue to monitor patient for appropriate discharge plan as she medically improves. May God bless her to do so.    Ricki Miller, RN BSN Case Manager [phone]

## 2018-09-01 NOTE — Progress Notes (Signed)
Patient said she already called her mom and updated her today.

## 2018-09-01 NOTE — Plan of Care (Signed)

## 2018-09-01 NOTE — Progress Notes (Addendum)
PROGRESS NOTE  Deborah Haney:[id number] DOB: 1965-09-22 DOA: 08/31/2018  PCP: Girtha Rm, NP-C  Brief History/Interval Summary: 53 y.o. female with medical history significant for hypertension, obesity and prediabetes presented to ER with a complaint of worsening cough and shortness of breath.  Apparently patient started having new symptoms about 9 to 10 days ago when she was diagnosed COVID positive.  She also mentioned episodes for rectal bleeding.  Patient was noted to be hypoxic.  She was hospitalized for further management.   Reason for Visit: Acute respiratory disease due to COVID-19  Consultants: None  Procedures: None  Antibiotics: None  Subjective/Interval History: Patient had an episode of blood per rectum at about 2:00 this morning.  She thinks it was large amount.  Patient denies any dizziness or lightheadedness.  No nausea vomiting.  No abdominal pain.  She reports a history of hemorrhoids and last underwent banding many months ago.  She does have shortness of breath and has been coughing with yellow to white expectoration.  Denies any blood in the sputum.    Assessment/Plan:  Acute Hypoxic Resp. Failure due to Acute Covid 19 Viral Illness  COVID-19 Labs  Recent Labs    08/31/18 0943 08/31/18 0950 09/01/18 0200  DDIMER  --  0.98* 0.78*  FERRITIN 338*  --  285  LDH  --  284* 254*  CRP 15.3*  --  10.1*    Lab Results  Component Value Date   SARSCOV2NAA Detected (A) 08/21/2018   [place] Not Detected 08/14/2018     Fever: No fever in the last 24 hours. Oxygen requirements: On 4 L of oxygen by nasal cannula saturating in the mid 90s. Antibiotics: None Remdesivir: Will be initiated today Steroids: Will be initiated today with Solu-Medrol 40 mg every 12 hours Diuretics: None yet Actemra: None yet Convalescent Plasma: None yet Vitamin C and Zinc: Will be initiated DVT Prophylaxis: Lovenox will be held due to rectal bleeding.  SCDs   Patient requiring 4 L of oxygen by nasal cannula saturating in the early 90s.  CRP noted to be 10.1 this morning.  Better from 15.3 yesterday.  LDH 254.  Ferritin 285.  D-dimer 0.78.  Patient will be placed on Solu-Medrol.  Patient agreeable to same.  She will be also prescribed Remdesivir.  LFTs only minimally elevated.  Renal function is normal.  Prone positioning, incentive spirometry and mobilization encouraged.  The treatment plan and use of medications and known side effects were discussed with patient.  Some of the medications used are based on case reports/anecdotal data which are not peer-reviewed and has not been studied using randomized control trials.  Complete risks and long-term side effects are unknown, however in the best clinical judgment they seem to be of some benefit.  Patient does want to think about Actemra before she is given this medication.  She is agreeable to steroids.   Hematochezia with known history of hemorrhoids Based on EMR it looks like she last underwent banding in February 2019.  She tells me that she started noticing blood in stool about 4 to 5 days ago.  She thinks that the amount has increased.  Minimal drop in hemoglobin is noted this morning.  We will give her hydrocortisone suppositories and cream.  For now we will treat this symptomatically.  Patient's respiratory status is too tenuous for her to undergo any procedures at this time.  We will hold her Lovenox.  She is not on any anticoagulants otherwise.  Not  on any antiplatelet agents.  Okay to change Protonix to oral.  Essential hypertension Continue to monitor blood pressures closely.  Continue lisinopril.  Hold HCTZ.  History of prediabetes Last HbA1c in EMR is 6.4.  She is diet controlled.  Continue SSI.  Anticipate some worsening in her glucose levels due to steroids.  Hypokalemia Has been repleted.  Magnesium level 2.1 this morning.  Potassium 3.8.  Normocytic anemia/acute blood loss Mild drop in  hemoglobin could be due to hemorrhoidal bleeding.  Continue to monitor.  Transfuse as needed.  DVT Prophylaxis: Lovenox will be changed over to SCDs PUD Prophylaxis: Change Protonix to oral Code Status: Full code Family Communication: Discussed with the patient Disposition Plan: Management as outlined above.  Mobilize as tolerated.   Medications:  Scheduled: Marland Kitchen AeroChamber Plus Flo-Vu Medium  1 each Other Once  . [START ON 09/02/2018] enoxaparin (LOVENOX) injection  40 mg Subcutaneous Q24H  . feeding supplement (ENSURE ENLIVE)  237 mL Oral BID BM  . hydrocortisone   Rectal TID  . hydrocortisone  25 mg Rectal BID  . insulin aspart  0-15 Units Subcutaneous TID WC  . insulin aspart  0-5 Units Subcutaneous QHS  . lisinopril  10 mg Oral Daily  . mouth rinse  15 mL Mouth Rinse BID  . methylPREDNISolone (SOLU-MEDROL) injection  40 mg Intravenous Q12H  . pantoprazole (PROTONIX) IV  40 mg Intravenous Q12H   Continuous: . remdesivir 200 mg in NS 250 mL     Followed by  . [START ON 09/02/2018] remdesivir 100 mg in NS 250 mL     XFG:[id number], albuterol, ondansetron **OR** ondansetron (ZOFRAN) IV   Objective:  Vital Signs  Vitals:   08/31/18 1948 09/01/18 0140 09/01/18 0400 09/01/18 0755  BP: 116/81 115/80 109/83 118/77  Pulse: 93 85 74   Resp: (!) 25 (!) 24 (!) 25   Temp: 99.2 F (37.3 C) 99.1 F (37.3 C) 99.5 F (37.5 C) 98.8 F (37.1 C)  TempSrc: Oral Oral Oral Oral  SpO2: 93% 92% 94%   Weight:   87.5 kg   Height:        Intake/Output Summary (Last 24 hours) at 09/01/2018 1105 Last data filed at 09/01/2018 1000 Gross per 24 hour  Intake 1331.4 ml  Output 1325 ml  Net 6.4 ml   Filed Weights   08/31/18 0917 09/01/18 0400  Weight: 88.5 kg 87.5 kg    General appearance: Awake alert.  In no distress Resp: Tachypneic at rest.  No use of accessory muscles.  Coarse breath sounds bilaterally.  Crackles bilateral bases.  No wheezing or rhonchi. Cardio: S1-S2 is normal  regular.  No S3-S4.  No rubs murmurs or bruit GI: Abdomen is soft.  Nontender nondistended.  Bowel sounds are present normal.  No masses organomegaly Extremities: No edema.  Full range of motion of lower extremities. Neurologic: Alert and oriented x3.  No focal neurological deficits.    Lab Results:  Data Reviewed: I have personally reviewed following labs and imaging studies  CBC: Recent Labs  Lab 08/26/18 1731 08/31/18 0950 09/01/18 0200  WBC 3.5* 6.3 4.8  NEUTROABS 2.1 4.3 2.9  HGB 12.4 11.7* 10.0*  HCT 38.9 35.8* 30.6*  MCV 89.8 89.7 89.7  PLT 142* 385 938    Basic Metabolic Panel: Recent Labs  Lab 08/26/18 1731 08/31/18 0950 09/01/18 0200  NA 135 138 143  K 3.7 3.4* 3.8  CL 98 100 101  CO2 26 24 23   GLUCOSE 105* 109* 134*  BUN 9 9 7   CREATININE 0.85 0.90 0.78  CALCIUM 8.7* 8.7* 9.1  MG  --   --  2.1    GFR: Estimated Creatinine Clearance: 93.8 mL/min (by C-G formula based on SCr of 0.78 mg/dL).  Liver Function Tests: Recent Labs  Lab 08/26/18 1731 08/31/18 0950 09/01/18 0200  AST 60* 58* 50*  ALT 56* 91* 71*  ALKPHOS 48 51 43  BILITOT 0.6 0.7 0.5  PROT 8.1 8.3* 7.0  ALBUMIN 3.7 3.2* 2.8*     Cardiac Enzymes: Recent Labs  Lab 08/31/18 0950  CKTOTAL 91    CBG: Recent Labs  Lab 08/31/18 1710 08/31/18 2357 09/01/18 0753  GLUCAP 88 108* 104*    Lipid Profile: Recent Labs    08/31/18 0943  TRIG 185*    Anemia Panel: Recent Labs    08/31/18 0943 09/01/18 0200  FERRITIN 338* 285    Recent Results (from the past 240 hour(s))  Blood Culture (routine x 2)     Status: None   Collection Time: 08/26/18  5:31 PM   Specimen: BLOOD LEFT FOREARM  Result Value Ref Range Status   Specimen Description   Final    BLOOD LEFT FOREARM Performed at [facility], [place] [address] [place], [place] [address]    Special Requests   Final    BOTTLES DRAWN AEROBIC AND ANAEROBIC Blood Culture results may not be optimal due to an excessive  volume of blood received in culture bottles Performed at [facility] [address], [place], [place] [address]    Culture   Final    NO GROWTH 5 DAYS Performed at [facility], [place] [address] [place], [place] [address]    Report Status 08/31/2018 FINAL  Final  Blood Culture (routine x 2)     Status: None   Collection Time: 08/26/18  5:36 PM   Specimen: BLOOD LEFT FOREARM  Result Value Ref Range Status   Specimen Description   Final    BLOOD LEFT FOREARM Performed at [facility], [place] [address] [place], [place] [address]    Special Requests   Final    BOTTLES DRAWN AEROBIC AND ANAEROBIC Blood Culture results may not be optimal due to an excessive volume of blood received in culture bottles Performed at [facility] [address] [place], [place] [address]    Culture   Final    NO GROWTH 5 DAYS Performed at [facility], [place] [address] [place], [place] [address]    Report Status 08/31/2018 FINAL  Final  Blood Culture (routine x 2)     Status: None (Preliminary result)   Collection Time: 08/31/18  9:50 AM   Specimen: BLOOD  Result Value Ref Range Status   Specimen Description   Final    BLOOD RIGHT ANTECUBITAL Performed at [facility] [address] [place], [place] [address]    Special Requests   Final    BOTTLES DRAWN AEROBIC AND ANAEROBIC Blood Culture adequate volume Performed at [facility] [address], [place],  [address]    Culture   Final    NO GROWTH < 24 HOURS Performed at [facility] [address], [place],  [address]    Report Status PENDING  Incomplete  Blood Culture (routine x 2)     Status: None (Preliminary result)   Collection Time: 08/31/18  9:50 AM   Specimen: BLOOD  Result  Value Ref Range Status   Specimen Description   Final    BLOOD LEFT ANTECUBITAL Performed at [facility] [address],  [place], [place] [address]    Special Requests   Final    BOTTLES DRAWN AEROBIC AND ANAEROBIC Blood Culture adequate volume Performed at [facility] [address], [place], [place] [address]    Culture   Final    NO GROWTH < 12 HOURS Performed at [facility] [address] [place], [place] [address]    Report Status PENDING  Incomplete      Radiology Studies: Dg Chest Port 1 View  Result Date: 08/31/2018 CLINICAL DATA:  Shortness of breath, productive cough, fever. EXAM: PORTABLE CHEST 1 VIEW COMPARISON:  Radiograph of August 26, 2018. FINDINGS: Stable cardiomediastinal silhouette. No pneumothorax is noted. Increased bibasilar opacities are noted concerning for worsening infiltrates or pneumonia with possible small pleural effusions. Bony thorax is unremarkable. IMPRESSION: Increased bibasilar opacities are noted concerning for worsening infiltrates or pneumonia. Electronically Signed   By: Marijo Conception M.D.   On: 08/31/2018 10:44       LOS: 1 day   [facility] Pager on [email or web address]  09/01/2018, 11:05 AM

## 2018-09-02 LAB — CBC WITH DIFFERENTIAL/PLATELET
Abs Immature Granulocytes: 0.04 10*3/uL (ref 0.00–0.07)
Basophils Absolute: 0 10*3/uL (ref 0.0–0.1)
Basophils Relative: 0 %
Eosinophils Absolute: 0 10*3/uL (ref 0.0–0.5)
Eosinophils Relative: 0 %
HCT: 33 % — ABNORMAL LOW (ref 36.0–46.0)
Hemoglobin: 10.6 g/dL — ABNORMAL LOW (ref 12.0–15.0)
Immature Granulocytes: 1 %
Lymphocytes Relative: 13 %
Lymphs Abs: 0.7 10*3/uL (ref 0.7–4.0)
MCH: 28.7 pg (ref 26.0–34.0)
MCHC: 32.1 g/dL (ref 30.0–36.0)
MCV: 89.4 fL (ref 80.0–100.0)
Monocytes Absolute: 0.2 10*3/uL (ref 0.1–1.0)
Monocytes Relative: 3 %
Neutro Abs: 4.7 10*3/uL (ref 1.7–7.7)
Neutrophils Relative %: 83 %
Platelets: 492 10*3/uL — ABNORMAL HIGH (ref 150–400)
RBC: 3.69 MIL/uL — ABNORMAL LOW (ref 3.87–5.11)
RDW: 14.3 % (ref 11.5–15.5)
WBC: 5.7 10*3/uL (ref 4.0–10.5)
nRBC: 0 % (ref 0.0–0.2)

## 2018-09-02 LAB — PROTIME-INR
INR: 1 (ref 0.8–1.2)
Prothrombin Time: 13.2 seconds (ref 11.4–15.2)

## 2018-09-02 LAB — COMPREHENSIVE METABOLIC PANEL
ALT: 75 U/L — ABNORMAL HIGH (ref 0–44)
AST: 50 U/L — ABNORMAL HIGH (ref 15–41)
Albumin: 2.8 g/dL — ABNORMAL LOW (ref 3.5–5.0)
Alkaline Phosphatase: 48 U/L (ref 38–126)
Anion gap: 11 (ref 5–15)
BUN: 10 mg/dL (ref 6–20)
CO2: 23 mmol/L (ref 22–32)
Calcium: 9 mg/dL (ref 8.9–10.3)
Chloride: 103 mmol/L (ref 98–111)
Creatinine, Ser: 0.75 mg/dL (ref 0.44–1.00)
GFR calc Af Amer: >60 mL/min (ref >60)
GFR calc non Af Amer: >60 mL/min (ref >60)
Glucose, Bld: 211 mg/dL — ABNORMAL HIGH (ref 70–99)
Potassium: 4.8 mmol/L (ref 3.5–5.1)
Sodium: 137 mmol/L (ref 135–145)
Total Bilirubin: 0.5 mg/dL (ref 0.3–1.2)
Total Protein: 7.7 g/dL (ref 6.5–8.1)

## 2018-09-02 LAB — GLUCOSE, CAPILLARY
Glucose-Capillary: 164 mg/dL — ABNORMAL HIGH (ref 70–99)
Glucose-Capillary: 224 mg/dL — ABNORMAL HIGH (ref 70–99)
Glucose-Capillary: 199 mg/dL — ABNORMAL HIGH (ref 70–99)
Glucose-Capillary: 140 mg/dL — ABNORMAL HIGH (ref 70–99)

## 2018-09-02 LAB — FERRITIN: Ferritin: 271 ng/mL (ref 11–307)

## 2018-09-02 LAB — MAGNESIUM: Magnesium: 2.2 mg/dL (ref 1.7–2.4)

## 2018-09-02 LAB — C-REACTIVE PROTEIN: CRP: 8.3 mg/dL — ABNORMAL HIGH (ref <1.0)

## 2018-09-02 LAB — D-DIMER, QUANTITATIVE: D-Dimer, Quant: 0.77 ug/mL-FEU — ABNORMAL HIGH (ref 0.00–0.50)

## 2018-09-02 LAB — LACTATE DEHYDROGENASE: LDH: 301 U/L — ABNORMAL HIGH (ref 98–192)

## 2018-09-02 NOTE — Plan of Care (Signed)

## 2018-09-02 NOTE — Plan of Care (Signed)
  Problem: Education: Goal: Knowledge of General Education information will improve Description: Including pain rating scale, medication(s)/side effects and non-pharmacologic comfort measures 09/02/2018 1653 by Amparo Bristol, RN Outcome: Progressing 09/02/2018 1647 by Amparo Bristol, RN Outcome: Adequate for Discharge 09/02/2018 0945 by Amparo Bristol, RN Outcome: Progressing   Problem: Health Behavior/Discharge Planning: Goal: Ability to manage health-related needs will improve 09/02/2018 1653 by Amparo Bristol, RN Outcome: Progressing 09/02/2018 1647 by Amparo Bristol, RN Outcome: Adequate for Discharge 09/02/2018 0945 by Amparo Bristol, RN Outcome: Progressing   Problem: Clinical Measurements: Goal: Ability to maintain clinical measurements within normal limits will improve 09/02/2018 1653 by Amparo Bristol, RN Outcome: Progressing 09/02/2018 1647 by Amparo Bristol, RN Outcome: Adequate for Discharge 09/02/2018 0945 by Amparo Bristol, RN Outcome: Progressing Goal: Will remain free from infection 09/02/2018 1653 by Amparo Bristol, RN Outcome: Progressing 09/02/2018 1647 by Amparo Bristol, RN Outcome: Adequate for Discharge 09/02/2018 0945 by Amparo Bristol, RN Outcome: Progressing Goal: Diagnostic test results will improve 09/02/2018 1653 by Amparo Bristol, RN Outcome: Progressing 09/02/2018 1647 by Amparo Bristol, RN Outcome: Adequate for Discharge 09/02/2018 0945 by Scheryl Darter D, RN Outcome: Progressing Goal: Respiratory complications will improve 09/02/2018 1653 by Amparo Bristol, RN Outcome: Progressing 09/02/2018 1647 by Amparo Bristol, RN Outcome: Adequate for Discharge 09/02/2018 0945 by Scheryl Darter D, RN Outcome: Progressing Goal: Cardiovascular complication will be avoided 09/02/2018 1653 by Amparo Bristol, RN Outcome: Progressing 09/02/2018 1647 by Amparo Bristol, RN Outcome: Adequate for Discharge 09/02/2018 0945 by Amparo Bristol, RN Outcome: Progressing   Problem: Activity: Goal: Risk for activity intolerance will decrease 09/02/2018 1653 by Amparo Bristol, RN Outcome: Progressing 09/02/2018 1647 by Amparo Bristol, RN Outcome: Adequate for Discharge 09/02/2018 0945 by Amparo Bristol, RN Outcome: Progressing   Problem: Nutrition: Goal: Adequate nutrition will be maintained 09/02/2018 1653 by Amparo Bristol, RN Outcome: Progressing 09/02/2018 1647 by Amparo Bristol, RN Outcome: Adequate for Discharge   Problem: Coping: Goal: Level of anxiety will decrease 09/02/2018 1653 by Amparo Bristol, RN Outcome: Progressing 09/02/2018 1647 by Amparo Bristol, RN Outcome: Adequate for Discharge   Problem: Elimination: Goal: Will not experience complications related to bowel motility 09/02/2018 1653 by Amparo Bristol, RN Outcome: Progressing 09/02/2018 1647 by Amparo Bristol, RN Outcome: Adequate for Discharge Goal: Will not experience complications related to urinary retention 09/02/2018 1653 by Amparo Bristol, RN Outcome: Progressing 09/02/2018 1647 by Amparo Bristol, RN Outcome: Adequate for Discharge   Problem: Pain Managment: Goal: General experience of comfort will improve 09/02/2018 1653 by Amparo Bristol, RN Outcome: Progressing 09/02/2018 1647 by Amparo Bristol, RN Outcome: Adequate for Discharge   Problem: Safety: Goal: Ability to remain free from injury will improve 09/02/2018 1653 by Amparo Bristol, RN Outcome: Progressing 09/02/2018 1647 by Amparo Bristol, RN Outcome: Adequate for Discharge   Problem: Skin Integrity: Goal: Risk for impaired skin integrity will decrease 09/02/2018 1653 by Amparo Bristol, RN Outcome: Progressing 09/02/2018 1647 by Amparo Bristol, RN Outcome: Adequate for Discharge

## 2018-09-02 NOTE — Progress Notes (Addendum)
PROGRESS NOTE  Deborah Haney LHT:[id number] DOB: [birth year] DOA: 08/31/2018  PCP: Girtha Rm, NP-C  Brief History/Interval Summary: 53 y.o. female with medical history significant for hypertension, obesity and prediabetes presented to ER with a complaint of worsening cough and shortness of breath.  Apparently patient started having new symptoms about 9 to 10 days ago when she was diagnosed COVID positive.  She also mentioned episodes for rectal bleeding.  Patient was noted to be hypoxic.  She was hospitalized for further management.   Reason for Visit: Acute respiratory disease due to COVID-19  Consultants: None  Procedures: None  Antibiotics: None  Subjective/Interval History: Patient states that she is feeling better this morning.  She did try prone positioning overnight but was very uncomfortable.  She did manage to lay on her sides.  Occasional cough.  She had another episode of rectal bleeding yesterday evening but none since then.  She states she has not had a bowel movement since she was given the hydrocortisone suppository yesterday.  No abdominal pain nausea or vomiting.     Assessment/Plan:  Acute Hypoxic Resp. Failure due to Acute Covid 19 Viral Illness  COVID-19 Labs  Recent Labs    08/31/18 0943 08/31/18 0950 09/01/18 0200 09/02/18 0135  DDIMER  --  0.98* 0.78* 0.77*  FERRITIN 338*  --  285 271  LDH  --  284* 254* 301*  CRP 15.3*  --  10.1* 8.3*    Lab Results  Component Value Date   SARSCOV2NAA Detected (A) 08/21/2018   [place] Not Detected 08/14/2018     Fever: She has been afebrile the last 48 hours. Oxygen requirements: She is down to 2 L of oxygen by nasal cannula saturating in the early 90s. Antibiotics: None Remdesivir: Day 2 today Steroids: Solu-Medrol 40 mg every 12 hours Diuretics: None yet Actemra: None yet Convalescent Plasma: None yet Vitamin C and Zinc: Continue  DVT Prophylaxis: Lovenox held due to rectal bleeding.  SCDs   Seems to be stable to improved from a respiratory standpoint.  She is now down to 2 L of oxygen by nasal cannula.  CRP has improved to 8.3.  LDH 301.  Ferritin 271.  D-dimer 0.77.  Continue steroids and Remdesivir.  LFTs are stable.  Renal function is normal.  Continue to wean down oxygen.  Incentive spirometry prone positioning and mobilization. Since inflammatory markers are improving and patient is feeling better we will hold off on Actemra for now.  But if needed will need to be discussed with patient again.   The treatment plan and use of medications and known side effects were discussed with patient.  Some of the medications used are based on case reports/anecdotal data which are not peer-reviewed and has not been studied using randomized control trials.  Complete risks and long-term side effects are unknown, however in the best clinical judgment they seem to be of some benefit. She was agreeable to steroids.  Patient wanted to think more about Actemra before she is given this medication.     Hematochezia with known history of hemorrhoids Hematochezia most likely hemorrhoidal in etiology.  Rectal examination done by ED provider at the time of admission showed large external hemorrhoids which were not bleeding.  Patient likely has internal hemorrhoids as well.  She was given hydrocortisone suppository yesterday with which there has been some improvement.  Her hemoglobin is stable this morning.  PT/INR is normal.  Her Lovenox was held.  Continue to monitor for now.  Unless she has profuse bleeding or becomes hemodynamically unstable this will be managed supportively.  She will need to see her gastroenterologist in the outpatient setting when her respiratory status has improved.    Essential hypertension Continue to monitor blood pressures closely.  Continue lisinopril.  Holding HCTZ.  Stable.  History of prediabetes Last HbA1c in EMR is 6.4.  She is diet controlled.  Continue SSI.  Elevated CBGs  are due to steroids.   Mild transaminitis Stable LFTs.  Most likely due to COVID-19.  Hypokalemia Potassium is normal today.  Magnesium 2.2.  Normocytic anemia/acute blood loss Mild drop in hemoglobin could be due to hemorrhoidal bleeding.  Hemoglobin stable this morning.  Transfuse if it drops below 7.  DVT Prophylaxis: SCDs PUD Prophylaxis: Protonix Code Status: Full code Family Communication: Discussed with the patient Disposition Plan: Management as outlined above.  Mobilize as tolerated.   Medications:  Scheduled: Marland Kitchen AeroChamber Plus Flo-Vu Medium  1 each Other Once  . enoxaparin (LOVENOX) injection  40 mg Subcutaneous Q24H  . feeding supplement (ENSURE ENLIVE)  237 mL Oral BID BM  . hydrocortisone   Rectal TID  . hydrocortisone  25 mg Rectal BID  . insulin aspart  0-15 Units Subcutaneous TID WC  . insulin aspart  0-5 Units Subcutaneous QHS  . lisinopril  10 mg Oral Daily  . mouth rinse  15 mL Mouth Rinse BID  . methylPREDNISolone (SOLU-MEDROL) injection  40 mg Intravenous Q12H  . pantoprazole  40 mg Oral Q1200  . vitamin C  500 mg Oral Daily  . zinc sulfate  220 mg Oral Daily   Continuous: . remdesivir 100 mg in NS 250 mL 100 mg (09/02/18 1005)   LNL:GXQJJHERDEYCX, albuterol, ondansetron **OR** ondansetron (ZOFRAN) IV   Objective:  Vital Signs  Vitals:   09/02/18 0530 09/02/18 0700 09/02/18 0800 09/02/18 0849  BP: 116/83  124/83 124/83  Pulse: 77 81 81   Resp: (!) 22 (!) 23 (!) 29   Temp: 98.1 F (36.7 C)  97.6 F (36.4 C)   TempSrc: Oral  Oral   SpO2: 100% 99% 95%   Weight:      Height:        Intake/Output Summary (Last 24 hours) at 09/02/2018 1059 Last data filed at 09/01/2018 2032 Gross per 24 hour  Intake 100 ml  Output 1700 ml  Net -1600 ml   Filed Weights   08/31/18 0917 09/01/18 0400 09/02/18 0500  Weight: 88.5 kg 87.5 kg 90 kg   General appearance: Awake alert.  In no distress Resp: Mildly tachypneic at rest.  Coarse breath sounds  bilaterally.  No wheezing or rhonchi. Cardio: S1-S2 is normal regular.  No S3-S4.  No rubs murmurs or bruit GI: Abdomen is soft.  Nontender nondistended.  Bowel sounds are present normal.  No masses organomegaly Extremities: No edema.  Full range of motion of lower extremities. Neurologic: Alert and oriented x3.  No focal neurological deficits.    Lab Results:  Data Reviewed: I have personally reviewed following labs and imaging studies  CBC: Recent Labs  Lab 08/26/18 1731 08/31/18 0950 09/01/18 0200 09/02/18 0135  WBC 3.5* 6.3 4.8 5.7  NEUTROABS 2.1 4.3 2.9 4.7  HGB 12.4 11.7* 10.0* 10.6*  HCT 38.9 35.8* 30.6* 33.0*  MCV 89.8 89.7 89.7 89.4  PLT 142* 385 388 492*    Basic Metabolic Panel: Recent Labs  Lab 08/26/18 1731 08/31/18 0950 09/01/18 0200 09/02/18 0135  NA 135 138 143 137  K  3.7 3.4* 3.8 4.8  CL 98 100 101 103  CO2 26 24 23 23   GLUCOSE 105* 109* 134* 211*  BUN 9 9 7 10   CREATININE 0.85 0.90 0.78 0.75  CALCIUM 8.7* 8.7* 9.1 9.0  MG  --   --  2.1 2.2    GFR: Estimated Creatinine Clearance: 95.1 mL/min (by C-G formula based on SCr of 0.75 mg/dL).  Liver Function Tests: Recent Labs  Lab 08/26/18 1731 08/31/18 0950 09/01/18 0200 09/02/18 0135  AST 60* 58* 50* 50*  ALT 56* 91* 71* 75*  ALKPHOS 48 51 43 48  BILITOT 0.6 0.7 0.5 0.5  PROT 8.1 8.3* 7.0 7.7  ALBUMIN 3.7 3.2* 2.8* 2.8*     Cardiac Enzymes: Recent Labs  Lab 08/31/18 0950  CKTOTAL 91    CBG: Recent Labs  Lab 08/31/18 2357 09/01/18 0753 09/01/18 1632 09/01/18 2151 09/02/18 0735  GLUCAP 108* 104* 123* 204* 164*    Lipid Profile: Recent Labs    08/31/18 0943  TRIG 185*    Anemia Panel: Recent Labs    09/01/18 0200 09/02/18 0135  FERRITIN 285 271    Recent Results (from the past 240 hour(s))  Blood Culture (routine x 2)     Status: None   Collection Time: 08/26/18  5:31 PM   Specimen: BLOOD LEFT FOREARM  Result Value Ref Range Status   Specimen Description    Final    BLOOD LEFT FOREARM Performed at [facility], [place] [address], [place], [place] [address]    Special Requests   Final    BOTTLES DRAWN AEROBIC AND ANAEROBIC Blood Culture results may not be optimal due to an excessive volume of blood received in culture bottles Performed at [facility] [address], [place], [place] [address]    Culture   Final    NO GROWTH 5 DAYS Performed at [facility], [place] [address] [place], [place] [address]    Report Status 08/31/2018 FINAL  Final  Blood Culture (routine x 2)     Status: None   Collection Time: 08/26/18  5:36 PM   Specimen: BLOOD LEFT FOREARM  Result Value Ref Range Status   Specimen Description   Final    BLOOD LEFT FOREARM Performed at [facility], [place] [address], [place], [place] [address]    Special Requests   Final    BOTTLES DRAWN AEROBIC AND ANAEROBIC Blood Culture results may not be optimal due to an excessive volume of blood received in culture bottles Performed at [facility] [address], [place], [place] [address]    Culture   Final    NO GROWTH 5 DAYS Performed at [facility], [place] [address], [place], [place] [address]    Report Status 08/31/2018 FINAL  Final  Blood Culture (routine x 2)     Status: None (Preliminary result)   Collection Time: 08/31/18  9:50 AM   Specimen: BLOOD  Result Value Ref Range Status   Specimen Description   Final    BLOOD RIGHT ANTECUBITAL Performed at [facility] [address], [place], [place] [address]    Special Requests   Final    BOTTLES DRAWN AEROBIC AND ANAEROBIC Blood Culture adequate volume Performed at [facility] [address], [place], [place] [address]    Culture   Final    NO GROWTH 2 DAYS Performed at [facility]  [address] [place], [place] [address]    Report Status PENDING  Incomplete  Blood Culture (routine x 2)     Status: None  (Preliminary result)   Collection Time: 08/31/18  9:50 AM   Specimen: BLOOD  Result Value Ref Range Status   Specimen Description   Final    BLOOD LEFT ANTECUBITAL Performed at [facility] [address] [place], [place] [address]    Special Requests   Final    BOTTLES DRAWN AEROBIC AND ANAEROBIC Blood Culture adequate volume Performed at [facility] [address] [place], [place] [address]    Culture   Final    NO GROWTH 2 DAYS Performed at [facility] [address], [place], [place] [address]    Report Status PENDING  Incomplete      Radiology Studies: No results found.     LOS: 2 days   [facility] Pager on [email or web address]  09/02/2018, 10:59 AM

## 2018-09-02 NOTE — Progress Notes (Signed)
Initial Nutrition Assessment  DOCUMENTATION CODES:   Obesity unspecified  INTERVENTION:   -Recommend liberalize diet to Regular  -Continue Ensure Enlive po BID, each supplement provides 350 kcal and 20 grams of protein -Continue Hormel Shake with breakfast -Continue Magic Cups with lunch and dinner  NUTRITION DIAGNOSIS:   Increased nutrient needs related to acute illness as evidenced by estimated needs.  GOAL:   Patient will meet greater than or equal to 90% of their needs  MONITOR:   PO intake, Supplement acceptance, Labs, Weight trends, I & O's  REASON FOR ASSESSMENT:   Consult Assessment of nutrition requirement/status  ASSESSMENT:   53 y.o. female with medical history significant of hypertension, obesity and prediabetes presented to ER with a complaint of worsening cough and shortness of breath.  Apparently patient started having new symptoms about 9 to 10 days ago when she was diagnosed COVID positive almost 9 days ago.  **RD working remotely**  Patient currently consuming 10-100% of meals. Pt is accepting Ensure supplements. Pt has had no nausea or vomiting. Will continue supplements as ordered.   Per weight records, pt's weight is now consistent with weight from September 2019. No significant weight changes noted.  Medications: Vitamin C tablet 500 mg daily, Zinc sulfate capsule daily  Labs reviewed: CBGs: 164   NUTRITION - FOCUSED PHYSICAL EXAM:  Unable to perform- working remotely.  Diet Order:   Diet Order            Diet Heart Room service appropriate? Yes; Fluid consistency: Thin  Diet effective now              EDUCATION NEEDS:   No education needs have been identified at this time  Skin:  Skin Assessment: Reviewed RN Assessment  Last BM:  6/27  Height:   Ht Readings from Last 1 Encounters:  08/31/18 5\' 6"  (1.676 m)    Weight:   Wt Readings from Last 1 Encounters:  09/02/18 90 kg    Ideal Body Weight:  59.1 kg  BMI:  Body  mass index is 32.04 kg/m.  Estimated Nutritional Needs:   Kcal:  2000-2200  Protein:  100-110g  Fluid:  2L/day  Clayton Bibles, MS, RD, LDN [facility] Pager: [phone] After Hours Pager: [phone]

## 2018-09-02 NOTE — Plan of Care (Deleted)
  Problem: Education: Goal: Knowledge of General Education information will improve Description: Including pain rating scale, medication(s)/side effects and non-pharmacologic comfort measures 09/02/2018 1647 by Amparo Bristol, RN Outcome: Adequate for Discharge 09/02/2018 0945 by Amparo Bristol, RN Outcome: Progressing   Problem: Health Behavior/Discharge Planning: Goal: Ability to manage health-related needs will improve 09/02/2018 1647 by Amparo Bristol, RN Outcome: Adequate for Discharge 09/02/2018 0945 by Amparo Bristol, RN Outcome: Progressing   Problem: Clinical Measurements: Goal: Ability to maintain clinical measurements within normal limits will improve 09/02/2018 1647 by Amparo Bristol, RN Outcome: Adequate for Discharge 09/02/2018 0945 by Amparo Bristol, RN Outcome: Progressing Goal: Will remain free from infection 09/02/2018 1647 by Amparo Bristol, RN Outcome: Adequate for Discharge 09/02/2018 0945 by Amparo Bristol, RN Outcome: Progressing Goal: Diagnostic test results will improve 09/02/2018 1647 by Amparo Bristol, RN Outcome: Adequate for Discharge 09/02/2018 0945 by Amparo Bristol, RN Outcome: Progressing Goal: Respiratory complications will improve 09/02/2018 1647 by Amparo Bristol, RN Outcome: Adequate for Discharge 09/02/2018 0945 by Amparo Bristol, RN Outcome: Progressing Goal: Cardiovascular complication will be avoided 09/02/2018 1647 by Amparo Bristol, RN Outcome: Adequate for Discharge 09/02/2018 0945 by Amparo Bristol, RN Outcome: Progressing   Problem: Activity: Goal: Risk for activity intolerance will decrease 09/02/2018 1647 by Amparo Bristol, RN Outcome: Adequate for Discharge 09/02/2018 0945 by Amparo Bristol, RN Outcome: Progressing   Problem: Nutrition: Goal: Adequate nutrition will be maintained Outcome: Adequate for Discharge   Problem: Coping: Goal: Level of anxiety will decrease Outcome: Adequate for  Discharge   Problem: Elimination: Goal: Will not experience complications related to bowel motility Outcome: Adequate for Discharge Goal: Will not experience complications related to urinary retention Outcome: Adequate for Discharge   Problem: Pain Managment: Goal: General experience of comfort will improve Outcome: Adequate for Discharge   Problem: Safety: Goal: Ability to remain free from injury will improve Outcome: Adequate for Discharge   Problem: Skin Integrity: Goal: Risk for impaired skin integrity will decrease Outcome: Adequate for Discharge

## 2018-09-02 NOTE — Progress Notes (Signed)
Patient's mother called and updated about current status.

## 2018-09-03 ENCOUNTER — Telehealth: Payer: Self-pay

## 2018-09-03 ENCOUNTER — Encounter: Payer: Self-pay | Admitting: Internal Medicine

## 2018-09-03 LAB — CBC WITH DIFFERENTIAL/PLATELET
Abs Immature Granulocytes: 0.18 K/uL — ABNORMAL HIGH (ref 0.00–0.07)
Basophils Absolute: 0 K/uL (ref 0.0–0.1)
Basophils Relative: 0 %
Eosinophils Absolute: 0 K/uL (ref 0.0–0.5)
Eosinophils Relative: 0 %
HCT: 30.9 % — ABNORMAL LOW (ref 36.0–46.0)
Hemoglobin: 9.9 g/dL — ABNORMAL LOW (ref 12.0–15.0)
Immature Granulocytes: 2 %
Lymphocytes Relative: 15 %
Lymphs Abs: 1.4 K/uL (ref 0.7–4.0)
MCH: 28.6 pg (ref 26.0–34.0)
MCHC: 32 g/dL (ref 30.0–36.0)
MCV: 89.3 fL (ref 80.0–100.0)
Monocytes Absolute: 0.3 K/uL (ref 0.1–1.0)
Monocytes Relative: 4 %
Neutro Abs: 7.1 K/uL (ref 1.7–7.7)
Neutrophils Relative %: 79 %
Platelets: 611 K/uL — ABNORMAL HIGH (ref 150–400)
RBC: 3.46 MIL/uL — ABNORMAL LOW (ref 3.87–5.11)
RDW: 14.3 % (ref 11.5–15.5)
WBC: 8.9 K/uL (ref 4.0–10.5)
nRBC: 0 % (ref 0.0–0.2)

## 2018-09-03 LAB — GLUCOSE, CAPILLARY
Glucose-Capillary: 139 mg/dL — ABNORMAL HIGH (ref 70–99)
Glucose-Capillary: 145 mg/dL — ABNORMAL HIGH (ref 70–99)
Glucose-Capillary: 191 mg/dL — ABNORMAL HIGH (ref 70–99)
Glucose-Capillary: 172 mg/dL — ABNORMAL HIGH (ref 70–99)
Glucose-Capillary: 161 mg/dL — ABNORMAL HIGH (ref 70–99)

## 2018-09-03 LAB — COMPREHENSIVE METABOLIC PANEL
ALT: 71 U/L — ABNORMAL HIGH (ref 0–44)
AST: 41 U/L (ref 15–41)
Albumin: 2.8 g/dL — ABNORMAL LOW (ref 3.5–5.0)
Alkaline Phosphatase: 43 U/L (ref 38–126)
Anion gap: 11 (ref 5–15)
BUN: 16 mg/dL (ref 6–20)
CO2: 22 mmol/L (ref 22–32)
Calcium: 8.8 mg/dL — ABNORMAL LOW (ref 8.9–10.3)
Chloride: 104 mmol/L (ref 98–111)
Creatinine, Ser: 0.75 mg/dL (ref 0.44–1.00)
GFR calc Af Amer: >60 mL/min (ref >60)
GFR calc non Af Amer: >60 mL/min (ref >60)
Glucose, Bld: 189 mg/dL — ABNORMAL HIGH (ref 70–99)
Potassium: 4.6 mmol/L (ref 3.5–5.1)
Sodium: 137 mmol/L (ref 135–145)
Total Bilirubin: 0.4 mg/dL (ref 0.3–1.2)
Total Protein: 7.5 g/dL (ref 6.5–8.1)

## 2018-09-03 LAB — C-REACTIVE PROTEIN: CRP: 2.7 mg/dL — ABNORMAL HIGH (ref <1.0)

## 2018-09-03 LAB — D-DIMER, QUANTITATIVE: D-Dimer, Quant: 0.52 ug/mL-FEU — ABNORMAL HIGH (ref 0.00–0.50)

## 2018-09-03 LAB — LACTATE DEHYDROGENASE: LDH: 240 U/L — ABNORMAL HIGH (ref 98–192)

## 2018-09-03 LAB — MAGNESIUM: Magnesium: 2 mg/dL (ref 1.7–2.4)

## 2018-09-03 NOTE — Progress Notes (Signed)
PROGRESS NOTE  Deborah Haney BZJ:[id number] DOB: Mar 20, 1965 DOA: 08/31/2018  PCP: Girtha Rm, NP-C  Brief History/Interval Summary: 53 y.o. female with medical history significant for hypertension, obesity and prediabetes presented to ER with a complaint of worsening cough and shortness of breath.  Apparently patient started having new symptoms about 9 to 10 days ago when she was diagnosed COVID positive.  She also mentioned episodes for rectal bleeding.  Patient was noted to be hypoxic.  She was hospitalized for further management.   Reason for Visit: Acute respiratory disease due to COVID-19  Consultants: None  Procedures: None  Antibiotics: None  Subjective/Interval History: Patient states that she appears to be improving.  She is not as short of breath as before.  Continues to have a dry cough.  No chest pain.  No nausea vomiting.  No further episodes of rectal bleeding.     Assessment/Plan:  Acute Hypoxic Resp. Failure due to Acute Covid 19 Viral Illness  COVID-19 Labs  Recent Labs    09/01/18 0200 09/02/18 0135 09/03/18 0350  DDIMER 0.78* 0.77* 0.52*  FERRITIN 285 271  --   LDH 254* 301* 240*  CRP 10.1* 8.3* 2.7*    Lab Results  Component Value Date   SARSCOV2NAA Detected (A) 08/21/2018   [place] Not Detected 08/14/2018     Fever: No fever in the last 3 days Oxygen requirements: She remains on 2 L of oxygen by nasal cannula saturating in the mid 90s.   Antibiotics: None Remdesivir: Day 3 today Steroids: Solu-Medrol 40 mg every 12 hours Diuretics: None yet Actemra: None yet Convalescent Plasma: None yet Vitamin C and Zinc: Continue  DVT Prophylaxis: Lovenox held due to rectal bleeding.  SCDs  Patient's respiratory status appears to be improving slowly.  She is still requiring 2 L of oxygen by nasal cannula.  Her inflammatory markers have improved.  CRP is 2.7.  D-dimer 0.52.  Ferritin was normal at 271.  LDH 240.  Continue with Solu-Medrol and  Remdesivir for now.  LFTs remain stable.  Renal function is normal.  At this time there is no indication to consider Actemra or convalescent plasma.   The treatment plan and use of medications and known side effects were discussed with patient.  Some of the medications used are based on case reports/anecdotal data which are not peer-reviewed and has not been studied using randomized control trials.  Complete risks and long-term side effects are unknown, however in the best clinical judgment they seem to be of some benefit. She was agreeable to steroids.  Patient wanted to think more about Actemra before she is given this medication.     Hematochezia with known history of hemorrhoids Hematochezia most likely hemorrhoidal in etiology.  Rectal examination done by ED provider at the time of admission showed large external hemorrhoids which were not bleeding.  Patient likely has internal hemorrhoids as well.  Patient was started on hydrocortisone suppositories.  Her bleeding appears to have subsided.  Hemoglobin is stable for the most part.  PT/INR was normal.  Lovenox was held.  She will need to see her gastroenterologist as in the near future. It looks like she is followed by Dr. Ardis Hughs and Dr. Hilarie Fredrickson.  Essential hypertension Blood pressure is adequately controlled.  Continue lisinopril.  Holding HCTZ.    History of prediabetes Last HbA1c in EMR is 6.4.  She is diet controlled.  Continue SSI.  Elevated CBGs are due to steroids.   Mild transaminitis Most likely  due to COVID-19.  LFTs are stable.    Hypokalemia Potassium is normal today.  Magnesium 2.2.  Normocytic anemia/acute blood loss Mild drop in hemoglobin could be due to hemorrhoidal bleeding.  Transfuse if it drops below 7.  DVT Prophylaxis: SCDs PUD Prophylaxis: Protonix Code Status: Full code Family Communication: Discussed with the patient Disposition Plan: Management as outlined above.  Mobilize as tolerated.   Medications:   Scheduled: Marland Kitchen AeroChamber Plus Flo-Vu Medium  1 each Other Once  . enoxaparin (LOVENOX) injection  40 mg Subcutaneous Q24H  . feeding supplement (ENSURE ENLIVE)  237 mL Oral BID BM  . hydrocortisone   Rectal TID  . hydrocortisone  25 mg Rectal BID  . insulin aspart  0-15 Units Subcutaneous TID WC  . insulin aspart  0-5 Units Subcutaneous QHS  . lisinopril  10 mg Oral Daily  . mouth rinse  15 mL Mouth Rinse BID  . methylPREDNISolone (SOLU-MEDROL) injection  40 mg Intravenous Q12H  . pantoprazole  40 mg Oral Q1200  . vitamin C  500 mg Oral Daily  . zinc sulfate  220 mg Oral Daily   Continuous: . remdesivir 100 mg in NS 250 mL 100 mg (09/03/18 1015)   ZOX:[id number], albuterol, ondansetron **OR** ondansetron (ZOFRAN) IV   Objective:  Vital Signs  Vitals:   09/02/18 2028 09/03/18 0410 09/03/18 0800 09/03/18 1142  BP: 139/89 123/69 131/75 140/79  Pulse: 89 78  87  Resp: (!) 32 (!) 24  (!) 22  Temp: 98.1 F (36.7 C) 98.4 F (36.9 C) 97.8 F (36.6 C) 98.3 F (36.8 C)  TempSrc: Oral Oral Oral Oral  SpO2: 91% 96%  93%  Weight:      Height:        Intake/Output Summary (Last 24 hours) at 09/03/2018 1157 Last data filed at 09/03/2018 1000 Gross per 24 hour  Intake 240 ml  Output 1300 ml  Net -1060 ml   Filed Weights   08/31/18 0917 09/01/18 0400 09/02/18 0500  Weight: 88.5 kg 87.5 kg 90 kg   General appearance: Awake alert.  In no distress Resp: Mildly tachypneic at rest.  Coarse breath sounds bilaterally.  No wheezing rales or rhonchi.  Few crackles at the bases. Cardio: S1-S2 is normal regular.  No S3-S4.  No rubs murmurs or bruit GI: Abdomen is soft.  Nontender nondistended.  Bowel sounds are present normal.  No masses organomegaly Extremities: No edema.  Full range of motion of lower extremities. Neurologic: Alert and oriented x3.  No focal neurological deficits.     Lab Results:  Data Reviewed: I have personally reviewed following labs and imaging studies   CBC: Recent Labs  Lab 08/31/18 0950 09/01/18 0200 09/02/18 0135 09/03/18 0350  WBC 6.3 4.8 5.7 8.9  NEUTROABS 4.3 2.9 4.7 7.1  HGB 11.7* 10.0* 10.6* 9.9*  HCT 35.8* 30.6* 33.0* 30.9*  MCV 89.7 89.7 89.4 89.3  PLT 385 388 492* 611*    Basic Metabolic Panel: Recent Labs  Lab 08/31/18 0950 09/01/18 0200 09/02/18 0135 09/03/18 0350  NA 138 143 137 137  K 3.4* 3.8 4.8 4.6  CL 100 101 103 104  CO2 24 23 23 22   GLUCOSE 109* 134* 211* 189*  BUN 9 7 10 16   CREATININE 0.90 0.78 0.75 0.75  CALCIUM 8.7* 9.1 9.0 8.8*  MG  --  2.1 2.2 2.0    GFR: Estimated Creatinine Clearance: 95.1 mL/min (by C-G formula based on SCr of 0.75 mg/dL).  Liver Function Tests:  Recent Labs  Lab 08/31/18 0950 09/01/18 0200 09/02/18 0135 09/03/18 0350  AST 58* 50* 50* 41  ALT 91* 71* 75* 71*  ALKPHOS 51 43 48 43  BILITOT 0.7 0.5 0.5 0.4  PROT 8.3* 7.0 7.7 7.5  ALBUMIN 3.2* 2.8* 2.8* 2.8*     Cardiac Enzymes: Recent Labs  Lab 08/31/18 0950  CKTOTAL 91    CBG: Recent Labs  Lab 09/02/18 1154 09/02/18 1644 09/02/18 2033 09/03/18 0807 09/03/18 1127  GLUCAP 224* 199* 140* 145* 191*    Anemia Panel: Recent Labs    09/01/18 0200 09/02/18 0135  FERRITIN 285 271    Recent Results (from the past 240 hour(s))  Blood Culture (routine x 2)     Status: None   Collection Time: 08/26/18  5:31 PM   Specimen: BLOOD LEFT FOREARM  Result Value Ref Range Status   Specimen Description   Final    BLOOD LEFT FOREARM Performed at [facility], [place] [address] [place], [place] [address]    Special Requests   Final    BOTTLES DRAWN AEROBIC AND ANAEROBIC Blood Culture results may not be optimal due to an excessive volume of blood received in culture bottles Performed at [facility] [address] [place], [place] [address]    Culture   Final    NO GROWTH 5 DAYS Performed at [facility], [place] [address] [place], [place] [address]    Report Status  08/31/2018 FINAL  Final  Blood Culture (routine x 2)     Status: None   Collection Time: 08/26/18  5:36 PM   Specimen: BLOOD LEFT FOREARM  Result Value Ref Range Status   Specimen Description   Final    BLOOD LEFT FOREARM Performed at [facility], [place] [address], [place], [place] [address]    Special Requests   Final    BOTTLES DRAWN AEROBIC AND ANAEROBIC Blood Culture results may not be optimal due to an excessive volume of blood received in culture bottles Performed at [facility] [address], [place], [place] [address]    Culture   Final    NO GROWTH 5 DAYS Performed at [facility], [place] [address], [place], [place] [address]    Report Status 08/31/2018 FINAL  Final  Blood Culture (routine x 2)     Status: None (Preliminary result)   Collection Time: 08/31/18  9:50 AM   Specimen: BLOOD  Result Value Ref Range Status   Specimen Description   Final    BLOOD RIGHT ANTECUBITAL Performed at [facility] [address] [place], [place] [address]    Special Requests   Final    BOTTLES DRAWN AEROBIC AND ANAEROBIC Blood Culture adequate volume Performed at [facility] [address] [place], [place] [address]    Culture   Final    NO GROWTH 3 DAYS Performed at [facility], [place] [address] [place], [place] [address]    Report Status PENDING  Incomplete  Blood Culture (routine x 2)     Status: None (Preliminary result)   Collection Time: 08/31/18  9:50 AM   Specimen: BLOOD  Result Value Ref Range Status   Specimen Description   Final    BLOOD LEFT ANTECUBITAL Performed at [facility] [address], [place], [place] [address]    Special Requests   Final    BOTTLES DRAWN AEROBIC AND ANAEROBIC Blood  Culture adequate volume Performed at [facility] [address], [place], [place] [address]    Culture   Final    NO GROWTH 3 DAYS Performed at [facility], [place] [address] [place], [place] [address]    Report Status PENDING  Incomplete      Radiology Studies: No results found.     LOS: 3 days   Cadarius Nevares [employer] on [email or web address]  09/03/2018, 11:57 AM

## 2018-09-03 NOTE — Telephone Encounter (Signed)
Pt was notified.  

## 2018-09-03 NOTE — Telephone Encounter (Signed)
Please take care of this for her. Thanks.  

## 2018-09-03 NOTE — Telephone Encounter (Signed)
Pt called and said she is in the Hospital with COVID. She said her appt complex told her she had to have a letter from her provider stating she had COVID and would need the cleaning company to come out and clean her house. She stated she had diarrhea and some other stuff because she was sick and she needs that stuff cleaned out the appt. Pt is wanting to hire outside agency and they her leasing office needs the letter before they will release the key to the agency. Pt is being release on Wednesday and stated she needs this letter asap.   The letter can be faxed to [phone] to [facility] or email [email or web address]

## 2018-09-03 NOTE — Progress Notes (Signed)
Called pt's mother with updates. She verbalized appreciation, denies having questions or concerns at this time.

## 2018-09-03 NOTE — Plan of Care (Signed)

## 2018-09-04 LAB — COMPREHENSIVE METABOLIC PANEL
ALT: 79 U/L — ABNORMAL HIGH (ref 0–44)
AST: 44 U/L — ABNORMAL HIGH (ref 15–41)
Albumin: 3 g/dL — ABNORMAL LOW (ref 3.5–5.0)
Alkaline Phosphatase: 43 U/L (ref 38–126)
Anion gap: 12 (ref 5–15)
BUN: 17 mg/dL (ref 6–20)
CO2: 23 mmol/L (ref 22–32)
Calcium: 8.9 mg/dL (ref 8.9–10.3)
Chloride: 104 mmol/L (ref 98–111)
Creatinine, Ser: 0.8 mg/dL (ref 0.44–1.00)
GFR calc Af Amer: >60 mL/min (ref >60)
GFR calc non Af Amer: >60 mL/min (ref >60)
Glucose, Bld: 175 mg/dL — ABNORMAL HIGH (ref 70–99)
Potassium: 4.6 mmol/L (ref 3.5–5.1)
Sodium: 139 mmol/L (ref 135–145)
Total Bilirubin: 0.4 mg/dL (ref 0.3–1.2)
Total Protein: 7.5 g/dL (ref 6.5–8.1)

## 2018-09-04 LAB — CBC WITH DIFFERENTIAL/PLATELET
Abs Immature Granulocytes: 0.4 10*3/uL — ABNORMAL HIGH (ref 0.00–0.07)
Basophils Absolute: 0 10*3/uL (ref 0.0–0.1)
Basophils Relative: 0 %
Eosinophils Absolute: 0 10*3/uL (ref 0.0–0.5)
Eosinophils Relative: 0 %
HCT: 31.7 % — ABNORMAL LOW (ref 36.0–46.0)
Hemoglobin: 10.4 g/dL — ABNORMAL LOW (ref 12.0–15.0)
Immature Granulocytes: 4 %
Lymphocytes Relative: 16 %
Lymphs Abs: 1.5 10*3/uL (ref 0.7–4.0)
MCH: 29.4 pg (ref 26.0–34.0)
MCHC: 32.8 g/dL (ref 30.0–36.0)
MCV: 89.5 fL (ref 80.0–100.0)
Monocytes Absolute: 0.6 10*3/uL (ref 0.1–1.0)
Monocytes Relative: 6 %
Neutro Abs: 7.2 10*3/uL (ref 1.7–7.7)
Neutrophils Relative %: 74 %
Platelets: 705 10*3/uL — ABNORMAL HIGH (ref 150–400)
RBC: 3.54 MIL/uL — ABNORMAL LOW (ref 3.87–5.11)
RDW: 14.6 % (ref 11.5–15.5)
WBC: 9.7 10*3/uL (ref 4.0–10.5)
nRBC: 0.2 % (ref 0.0–0.2)

## 2018-09-04 LAB — GLUCOSE, CAPILLARY
Glucose-Capillary: 153 mg/dL — ABNORMAL HIGH (ref 70–99)
Glucose-Capillary: 274 mg/dL — ABNORMAL HIGH (ref 70–99)
Glucose-Capillary: 343 mg/dL — ABNORMAL HIGH (ref 70–99)
Glucose-Capillary: 201 mg/dL — ABNORMAL HIGH (ref 70–99)

## 2018-09-04 LAB — C-REACTIVE PROTEIN: CRP: 1.3 mg/dL — ABNORMAL HIGH (ref <1.0)

## 2018-09-04 MED ORDER — METHYLPREDNISOLONE SODIUM SUCC 40 MG IJ SOLR
40.0000 mg | Freq: Every day | INTRAMUSCULAR | Status: DC
  Administered 2018-09-05: 40 mg via INTRAVENOUS
  Filled 2018-09-04: qty 1

## 2018-09-04 NOTE — Progress Notes (Signed)
PROGRESS NOTE  Deborah Haney WTU:[id number] DOB: [birth year] DOA: 08/31/2018  PCP: Girtha Rm, NP-C  Brief History/Interval Summary: 53 y.o. female with medical history significant for hypertension, obesity and prediabetes presented to ER with a complaint of worsening cough and shortness of breath.  Apparently patient started having new symptoms about 9 to 10 days ago when she was diagnosed COVID positive.  She also mentioned episodes for rectal bleeding.  Patient was noted to be hypoxic.  She was hospitalized for further management.   Reason for Visit: Acute respiratory disease due to COVID-19  Consultants: None  Procedures: None  Antibiotics: None  Subjective/Interval History: Patient states that she continues to feel better.  No issues overnight however this morning her oxygen saturations were noted to be low so she was placed back on oxygen.  Denies any chest pain.  Cough has improved but persists.      Assessment/Plan:  Acute Hypoxic Resp. Failure due to Acute Covid 19 Viral Illness  COVID-19 Labs  Recent Labs    09/02/18 0135 09/03/18 0350 09/04/18 0150  DDIMER 0.77* 0.52*  --   FERRITIN 271  --   --   LDH 301* 240*  --   CRP 8.3* 2.7* 1.3*    Lab Results  Component Value Date   SARSCOV2NAA Detected (A) 08/21/2018   [place] Not Detected 08/14/2018     Fever: Has been afebrile here in the hospital. Oxygen requirements: Was weaned down to room air yesterday but back on oxygen this morning.  Continue to wean down as tolerated.  Saturating in the mid 90s.   Antibiotics: None Remdesivir: Day 4 today Steroids: Solu-Medrol 40 mg every 12 hours Diuretics: None yet Actemra: None yet Convalescent Plasma: None yet Vitamin C and Zinc: Continue  DVT Prophylaxis: Lovenox held due to rectal bleeding.  SCDs  His respiratory status is is improving.  Inflammatory markers have improved.  CRP is down to 1.3.  Continue to wean down oxygen.  Continue steroids.   Could consider tapering starting tomorrow.  She will complete course of Remdesivir tomorrow.  LFTs minimally elevated.  Renal function is normal.  Considering clinical improvement Actemra or convalescent plasma is not indicated.  The treatment plan and use of medications and known side effects were discussed with patient.  Some of the medications used are based on case reports/anecdotal data which are not peer-reviewed and has not been studied using randomized control trials.  Complete risks and long-term side effects are unknown, however in the best clinical judgment they seem to be of some benefit. She was agreeable to steroids.  Patient wanted to think more about Actemra before she is given this medication.    Hematochezia with known history of hemorrhoids Hematochezia most likely hemorrhoidal in etiology.  Rectal examination done by ED provider at the time of admission showed large external hemorrhoids which were not bleeding.  Patient likely has internal hemorrhoids as well.  Patient was started on hydrocortisone suppositories.  Bleeding has subsided.  Hemoglobin is stable for the most part.  PT/INR was normal.  Lovenox was held. She will need to see her gastroenterologist as in the near future. It looks like she is followed by Dr. Ardis Hughs.  Essential hypertension Pressure is reasonably well controlled.  Continue lisinopril.  Holding HCTZ.    History of prediabetes Last HbA1c in EMR is 6.4.  She is diet controlled.  Continue SSI.  Elevated CBGs are due to steroids.   Mild transaminitis Most likely due to COVID-19.  LFTs are stable.    Hypokalemia Potassium was repleted.  Magnesium was normal.  Normocytic anemia/acute blood loss Mild drop in hemoglobin could be due to hemorrhoidal bleeding.  Hemoglobin stable.  Has not required blood transfusion.  DVT Prophylaxis: SCDs PUD Prophylaxis: Protonix Code Status: Full code Family Communication: Discussed with the patient.  Discussed with the  mother yesterday.  We will call her again today. Disposition Plan: Management as outlined above.  Mobilize as tolerated.  Hopefully home in 1 to 2 days.   Medications:  Scheduled: Marland Kitchen AeroChamber Plus Flo-Vu Medium  1 each Other Once  . enoxaparin (LOVENOX) injection  40 mg Subcutaneous Q24H  . feeding supplement (ENSURE ENLIVE)  237 mL Oral BID BM  . hydrocortisone   Rectal TID  . hydrocortisone  25 mg Rectal BID  . insulin aspart  0-15 Units Subcutaneous TID WC  . insulin aspart  0-5 Units Subcutaneous QHS  . lisinopril  10 mg Oral Daily  . mouth rinse  15 mL Mouth Rinse BID  . methylPREDNISolone (SOLU-MEDROL) injection  40 mg Intravenous Q12H  . pantoprazole  40 mg Oral Q1200  . vitamin C  500 mg Oral Daily  . zinc sulfate  220 mg Oral Daily   Continuous: . remdesivir 100 mg in NS 250 mL 100 mg (09/03/18 1015)   JSE:[id number], albuterol, ondansetron **OR** ondansetron (ZOFRAN) IV   Objective:  Vital Signs  Vitals:   09/03/18 2000 09/04/18 0400 09/04/18 0500 09/04/18 0800  BP:  125/87  129/79  Pulse:    81  Resp:    20  Temp: 98.7 F (37.1 C) 98.6 F (37 C)  98.5 F (36.9 C)  TempSrc:    Oral  SpO2:    94%  Weight:   86.5 kg   Height:        Intake/Output Summary (Last 24 hours) at 09/04/2018 1040 Last data filed at 09/04/2018 0929 Gross per 24 hour  Intake 1340 ml  Output 1500 ml  Net -160 ml   Filed Weights   09/01/18 0400 09/02/18 0500 09/04/18 0500  Weight: 87.5 kg 90 kg 86.5 kg     General appearance: Awake alert.  In no distress Resp: Normal effort at rest.  Improved air entry bilaterally.  Few crackles at the bases.  No wheezing or rhonchi. Cardio: S1-S2 is normal regular.  No S3-S4.  No rubs murmurs or bruit GI: Abdomen is soft.  Nontender nondistended.  Bowel sounds are present normal.  No masses organomegaly Extremities: No edema.  Full range of motion of lower extremities. Neurologic: Alert and oriented x3.  No focal neurological deficits.       Lab Results:  Data Reviewed: I have personally reviewed following labs and imaging studies  CBC: Recent Labs  Lab 08/31/18 0950 09/01/18 0200 09/02/18 0135 09/03/18 0350 09/04/18 0150  WBC 6.3 4.8 5.7 8.9 9.7  NEUTROABS 4.3 2.9 4.7 7.1 7.2  HGB 11.7* 10.0* 10.6* 9.9* 10.4*  HCT 35.8* 30.6* 33.0* 30.9* 31.7*  MCV 89.7 89.7 89.4 89.3 89.5  PLT 385 388 492* 611* 705*    Basic Metabolic Panel: Recent Labs  Lab 08/31/18 0950 09/01/18 0200 09/02/18 0135 09/03/18 0350 09/04/18 0150  NA 138 143 137 137 139  K 3.4* 3.8 4.8 4.6 4.6  CL 100 101 103 104 104  CO2 24 23 23 22 23   GLUCOSE 109* 134* 211* 189* 175*  BUN 9 7 10 16 17   CREATININE 0.90 0.78 0.75 0.75 0.80  CALCIUM 8.7*  9.1 9.0 8.8* 8.9  MG  --  2.1 2.2 2.0  --     GFR: Estimated Creatinine Clearance: 93.2 mL/min (by C-G formula based on SCr of 0.8 mg/dL).  Liver Function Tests: Recent Labs  Lab 08/31/18 0950 09/01/18 0200 09/02/18 0135 09/03/18 0350 09/04/18 0150  AST 58* 50* 50* 41 44*  ALT 91* 71* 75* 71* 79*  ALKPHOS 51 43 48 43 43  BILITOT 0.7 0.5 0.5 0.4 0.4  PROT 8.3* 7.0 7.7 7.5 7.5  ALBUMIN 3.2* 2.8* 2.8* 2.8* 3.0*     Cardiac Enzymes: Recent Labs  Lab 08/31/18 0950  CKTOTAL 91    CBG: Recent Labs  Lab 09/03/18 0807 09/03/18 1127 09/03/18 1617 09/03/18 2208 09/04/18 0758  GLUCAP 145* 191* 172* 161* 153*    Anemia Panel: Recent Labs    09/02/18 0135  FERRITIN 271    Recent Results (from the past 240 hour(s))  Blood Culture (routine x 2)     Status: None   Collection Time: 08/26/18  5:31 PM   Specimen: BLOOD LEFT FOREARM  Result Value Ref Range Status   Specimen Description   Final    BLOOD LEFT FOREARM Performed at [facility], [address] [place], [place] [address]    Special Requests   Final    BOTTLES DRAWN AEROBIC AND ANAEROBIC Blood Culture results may not be optimal due to an excessive volume of blood received in culture bottles Performed at  [facility] [address] [place], [place] [address]    Culture   Final    NO GROWTH 5 DAYS Performed at [facility], [place] [address], [place], [place] [address]    Report Status 08/31/2018 FINAL  Final  Blood Culture (routine x 2)     Status: None   Collection Time: 08/26/18  5:36 PM   Specimen: BLOOD LEFT FOREARM  Result Value Ref Range Status   Specimen Description   Final    BLOOD LEFT FOREARM Performed at [facility], [place] [address] [place], [place] [address]    Special Requests   Final    BOTTLES DRAWN AEROBIC AND ANAEROBIC Blood Culture results may not be optimal due to an excessive volume of blood received in culture bottles Performed at [facility] [address] [place], [place] [address]    Culture   Final    NO GROWTH 5 DAYS Performed at [facility], [place] [address], [place], [place] [address]    Report Status 08/31/2018 FINAL  Final  Blood Culture (routine x 2)     Status: None (Preliminary result)   Collection Time: 08/31/18  9:50 AM   Specimen: BLOOD  Result Value Ref Range Status   Specimen Description   Final    BLOOD RIGHT ANTECUBITAL Performed at [facility] [address], [place], [place] [address]    Special Requests   Final    BOTTLES DRAWN AEROBIC AND ANAEROBIC Blood Culture adequate volume Performed at [facility] [address], [place], [place] [address]    Culture   Final    NO GROWTH 4 DAYS Performed at [facility], [place] [address], [place],  [address]    Report Status PENDING  Incomplete  Blood Culture (routine x 2)     Status: None (Preliminary result)   Collection Time: 08/31/18  9:50 AM   Specimen: BLOOD  Result Value Ref Range Status  Specimen Description   Final    BLOOD LEFT ANTECUBITAL Performed at [facility] [address] [place], [place] [address]    Special Requests   Final    BOTTLES  DRAWN AEROBIC AND ANAEROBIC Blood Culture adequate volume Performed at [facility] [address], [place], [place] [address]    Culture   Final    NO GROWTH 4 DAYS Performed at [facility], [place] [address] [place], [place] [address]    Report Status PENDING  Incomplete      Radiology Studies: No results found.     LOS: 4 days   Jazlynn Nemetz [employer] on [email or web address]  09/04/2018, 10:40 AM

## 2018-09-04 NOTE — Progress Notes (Signed)
Updated patient's mother over the phone.

## 2018-09-05 DIAGNOSIS — J9621 Acute and chronic respiratory failure with hypoxia: Secondary | ICD-10-CM

## 2018-09-05 HISTORY — DX: Acute and chronic respiratory failure with hypoxia: J96.21

## 2018-09-05 LAB — CBC WITH DIFFERENTIAL/PLATELET
Abs Immature Granulocytes: 0.37 10*3/uL — ABNORMAL HIGH (ref 0.00–0.07)
Basophils Absolute: 0.1 10*3/uL (ref 0.0–0.1)
Basophils Relative: 1 %
Eosinophils Absolute: 0 10*3/uL (ref 0.0–0.5)
Eosinophils Relative: 1 %
HCT: 32.3 % — ABNORMAL LOW (ref 36.0–46.0)
Hemoglobin: 10.6 g/dL — ABNORMAL LOW (ref 12.0–15.0)
Immature Granulocytes: 4 %
Lymphocytes Relative: 26 %
Lymphs Abs: 2.2 10*3/uL (ref 0.7–4.0)
MCH: 29.3 pg (ref 26.0–34.0)
MCHC: 32.8 g/dL (ref 30.0–36.0)
MCV: 89.2 fL (ref 80.0–100.0)
Monocytes Absolute: 0.8 10*3/uL (ref 0.1–1.0)
Monocytes Relative: 9 %
Neutro Abs: 5.1 10*3/uL (ref 1.7–7.7)
Neutrophils Relative %: 59 %
Platelets: 725 10*3/uL — ABNORMAL HIGH (ref 150–400)
RBC: 3.62 MIL/uL — ABNORMAL LOW (ref 3.87–5.11)
RDW: 14.6 % (ref 11.5–15.5)
WBC: 8.5 10*3/uL (ref 4.0–10.5)
nRBC: 0.7 % — ABNORMAL HIGH (ref 0.0–0.2)

## 2018-09-05 LAB — C-REACTIVE PROTEIN: CRP: <0.8 mg/dL (ref <1.0)

## 2018-09-05 LAB — COMPREHENSIVE METABOLIC PANEL
ALT: 74 U/L — ABNORMAL HIGH (ref 0–44)
AST: 28 U/L (ref 15–41)
Albumin: 2.9 g/dL — ABNORMAL LOW (ref 3.5–5.0)
Alkaline Phosphatase: 42 U/L (ref 38–126)
Anion gap: 7 (ref 5–15)
BUN: 17 mg/dL (ref 6–20)
CO2: 25 mmol/L (ref 22–32)
Calcium: 8.8 mg/dL — ABNORMAL LOW (ref 8.9–10.3)
Chloride: 106 mmol/L (ref 98–111)
Creatinine, Ser: 0.79 mg/dL (ref 0.44–1.00)
GFR calc Af Amer: >60 mL/min (ref >60)
GFR calc non Af Amer: >60 mL/min (ref >60)
Glucose, Bld: 141 mg/dL — ABNORMAL HIGH (ref 70–99)
Potassium: 4.4 mmol/L (ref 3.5–5.1)
Sodium: 138 mmol/L (ref 135–145)
Total Bilirubin: 0.2 mg/dL — ABNORMAL LOW (ref 0.3–1.2)
Total Protein: 6.9 g/dL (ref 6.5–8.1)

## 2018-09-05 LAB — CULTURE, BLOOD (ROUTINE X 2)
Culture: NO GROWTH
Culture: NO GROWTH
Special Requests: ADEQUATE
Special Requests: ADEQUATE

## 2018-09-05 LAB — GLUCOSE, CAPILLARY
Glucose-Capillary: 112 mg/dL — ABNORMAL HIGH (ref 70–99)
Glucose-Capillary: 342 mg/dL — ABNORMAL HIGH (ref 70–99)
Glucose-Capillary: 217 mg/dL — ABNORMAL HIGH (ref 70–99)
Glucose-Capillary: 254 mg/dL — ABNORMAL HIGH (ref 70–99)

## 2018-09-05 MED ORDER — INSULIN DETEMIR 100 UNIT/ML ~~LOC~~ SOLN
10.0000 [IU] | Freq: Two times a day (BID) | SUBCUTANEOUS | Status: DC
  Administered 2018-09-05 – 2018-09-06 (×3): 10 [IU] via SUBCUTANEOUS
  Filled 2018-09-05 (×3): qty 0.1

## 2018-09-05 MED ORDER — METHYLPREDNISOLONE SODIUM SUCC 40 MG IJ SOLR
20.0000 mg | Freq: Every day | INTRAMUSCULAR | Status: DC
  Administered 2018-09-06: 20 mg via INTRAVENOUS
  Filled 2018-09-05: qty 1

## 2018-09-05 NOTE — Progress Notes (Signed)
Inpatient Diabetes Program Recommendations  AACE/ADA: New Consensus Statement on Inpatient Glycemic Control (2015)  Target Ranges:  Prepandial:   less than 140 mg/dL      Peak postprandial:   less than 180 mg/dL (1-2 hours)      Critically ill patients:  140 - 180 mg/dL   Lab Results  Component Value Date   GLUCAP 342 (H) 09/05/2018   HGBA1C 6.4 (H) 01/29/2018    Review of Glycemic Control Results for Deborah Haney, Deborah Haney (MRN [id number]) as of 09/05/2018 12:53  Ref. Range 09/04/2018 12:00 09/04/2018 16:12 09/04/2018 21:16 09/05/2018 07:24 09/05/2018 11:54  Glucose-Capillary Latest Ref Range: 70 - 99 mg/dL 274 (H) 343 (H) 201 (H) 112 (H) 342 (H)   Diabetes history: DM 2 Outpatient Diabetes medications:  None Current orders for Inpatient glycemic control:  Novolog moderate tid with meals and HS Solumedrol 20 mg IV daily Inpatient Diabetes Program Recommendations:   Please consider adding Novolog 3 units tid with meals (hold if patient eats less than 50%).   Thanks  Adah Perl, RN, BC-ADM Inpatient Diabetes Coordinator Pager [phone] (8a-5p)

## 2018-09-05 NOTE — Progress Notes (Signed)
[facility] PROGRESS NOTE    Progress Note  Deborah Haney  UKG:[id number] DOB: Oct 30, 1965 DOA: 08/31/2018 PCP: Girtha Rm, NP-C     Brief Narrative:   Deborah Haney is an 53 y.o. female past medical history significant for hypertension obesity and prediabetes mellitus presents to the ED complaining of worsening cough and shortness of breath.  According to the patient she started having symptoms about 9 to 10 days prior to admission was diagnosed with SARS-CoV-2, she also mentions some episodes of rectal bleeding.  09/01/2018 IV Remdesivir 09/01/2018 IV steroids  Assessment/Plan:   Acute on chronic respiratory failure with hypoxia due to COVID-19 virus infection: Has been afebrile, his oxygen requirement has been weaned down to room air. Continue vitamin C and zinc Inflammatory markers are improved.  She is being weaned down to room air slowly. She is on IV steroids which are tapering down. She will complete her course of Remdesivir today.  Hematochezia with known history of hemorrhoids: Likely hemorrhoidal bleed. Rectal exam done by ED provider showed large external hemorrhoids. Hemoglobin for the most part is stable.  Hold Lovenox.  Essential HTN (hypertension) Continue lisinopril, continue to hold hydrochlorothiazide.  History of prediabetes: With an A1c of 6.4, continue sliding scale insulin.  Blood glucose elevated likely due to steroids.  Transaminitis: Likely due to COVID-19 to have remained relatively stable.  Hypokalemia Repletal orally now resolved.    DVT prophylaxis: SCD's Family Communication:none Disposition Plan/Barrier to D/C:  7.2.2020 Code Status:     Code Status Orders  (From admission, onward)         Start     Ordered   08/31/18 1737  Full code  Continuous     08/31/18 1736        Code Status History    Date Active Date Inactive Code Status Order ID Comments User Context   07/14/2013 1113 07/17/2013 1422 Full Code [id number]   Greer Pickerel, MD Inpatient   Advance Care Planning Activity    Advance Directive Documentation     Most Recent Value  Type of Advance Directive  Living will  Pre-existing out of facility DNR order (yellow form or pink MOST form)  --  "MOST" Form in Place?  --        IV Access:    Peripheral IV   Procedures and diagnostic studies:   No results found.   Medical Consultants:    None.  Anti-Infectives:   none  Subjective:    Deborah Haney she relates she feels great still little bit winded with walking to the bathroom  Objective:    Vitals:   09/05/18 0037 09/05/18 0404 09/05/18 0416 09/05/18 0725  BP: 118/79 124/76  101/86  Pulse: 74 80  77  Resp: 16 19  16   Temp: 97.9 F (36.6 C) 98.4 F (36.9 C)  98 F (36.7 C)  TempSrc: Oral Oral  Oral  SpO2: 96% 96%  96%  Weight:   86.2 kg   Height:        Intake/Output Summary (Last 24 hours) at 09/05/2018 0739 Last data filed at 09/04/2018 1830 Gross per 24 hour  Intake 960 ml  Output --  Net 960 ml   Filed Weights   09/02/18 0500 09/04/18 0500 09/05/18 0416  Weight: 90 kg 86.5 kg 86.2 kg    Exam: General exam: In no acute distress. Respiratory system: Good air movement and clear to auscultation. Cardiovascular system: S1 & S2 heard, RRR. No JVD. Gastrointestinal  system: Abdomen is nondistended, soft and nontender.  Central nervous system: Alert and oriented. No focal neurological deficits. Extremities: No pedal edema. Skin: No rashes, lesions or ulcers Psychiatry: Judgement and insight appear normal. Mood & affect appropriate.     Data Reviewed:    Labs: Basic Metabolic Panel: Recent Labs  Lab 09/01/18 0200 09/02/18 0135 09/03/18 0350 09/04/18 0150 09/05/18 0402  NA 143 137 137 139 138  K 3.8 4.8 4.6 4.6 4.4  CL 101 103 104 104 106  CO2 23 23 22 23 25   GLUCOSE 134* 211* 189* 175* 141*  BUN 7 10 16 17 17   CREATININE 0.78 0.75 0.75 0.80 0.79  CALCIUM 9.1 9.0 8.8* 8.9 8.8*  MG 2.1 2.2  2.0  --   --    GFR Estimated Creatinine Clearance: 93.1 mL/min (by C-G formula based on SCr of 0.79 mg/dL). Liver Function Tests: Recent Labs  Lab 09/01/18 0200 09/02/18 0135 09/03/18 0350 09/04/18 0150 09/05/18 0402  AST 50* 50* 41 44* 28  ALT 71* 75* 71* 79* 74*  ALKPHOS 43 48 43 43 42  BILITOT 0.5 0.5 0.4 0.4 0.2*  PROT 7.0 7.7 7.5 7.5 6.9  ALBUMIN 2.8* 2.8* 2.8* 3.0* 2.9*   No results for input(s): LIPASE, AMYLASE in the last 168 hours. No results for input(s): AMMONIA in the last 168 hours. Coagulation profile Recent Labs  Lab 09/02/18 0135  INR 1.0   COVID-19 Labs  Recent Labs    09/03/18 0350 09/04/18 0150 09/05/18 0402  DDIMER 0.52*  --   --   LDH 240*  --   --   CRP 2.7* 1.3* <0.8    Lab Results  Component Value Date   SARSCOV2NAA Detected (A) 08/21/2018   [place] Not Detected 08/14/2018    CBC: Recent Labs  Lab 09/01/18 0200 09/02/18 0135 09/03/18 0350 09/04/18 0150 09/05/18 0402  WBC 4.8 5.7 8.9 9.7 8.5  NEUTROABS 2.9 4.7 7.1 7.2 5.1  HGB 10.0* 10.6* 9.9* 10.4* 10.6*  HCT 30.6* 33.0* 30.9* 31.7* 32.3*  MCV 89.7 89.4 89.3 89.5 89.2  PLT 388 492* 611* 705* 725*   Cardiac Enzymes: Recent Labs  Lab 08/31/18 0950  CKTOTAL 91   BNP (last 3 results) No results for input(s): PROBNP in the last 8760 hours. CBG: Recent Labs  Lab 09/03/18 2208 09/04/18 0758 09/04/18 1200 09/04/18 1612 09/04/18 2116  GLUCAP 161* 153* 274* 343* 201*   D-Dimer: Recent Labs    09/03/18 0350  DDIMER 0.52*   Hgb A1c: No results for input(s): HGBA1C in the last 72 hours. Lipid Profile: No results for input(s): CHOL, HDL, LDLCALC, TRIG, CHOLHDL, LDLDIRECT in the last 72 hours. Thyroid function studies: No results for input(s): TSH, T4TOTAL, T3FREE, THYROIDAB in the last 72 hours.  Invalid input(s): FREET3 Anemia work up: No results for input(s): VITAMINB12, FOLATE, FERRITIN, TIBC, IRON, RETICCTPCT in the last 72 hours. Sepsis Labs: Recent  Labs  Lab 08/31/18 0950  09/02/18 0135 09/03/18 0350 09/04/18 0150 09/05/18 0402  PROCALCITON <0.10  --   --   --   --   --   WBC 6.3   < > 5.7 8.9 9.7 8.5  LATICACIDVEN 1.1  --   --   --   --   --    < > = values in this interval not displayed.   Microbiology Recent Results (from the past 240 hour(s))  Blood Culture (routine x 2)     Status: None   Collection Time: 08/26/18  5:31 PM   Specimen: BLOOD LEFT FOREARM  Result Value Ref Range Status   Specimen Description   Final    BLOOD LEFT FOREARM Performed at [facility], [place] [address] [place], [place] [address]    Special Requests   Final    BOTTLES DRAWN AEROBIC AND ANAEROBIC Blood Culture results may not be optimal due to an excessive volume of blood received in culture bottles Performed at [facility] [address] [place], [place] [address]    Culture   Final    NO GROWTH 5 DAYS Performed at [facility], [place] [address], [place], [place] [address]    Report Status 08/31/2018 FINAL  Final  Blood Culture (routine x 2)     Status: None   Collection Time: 08/26/18  5:36 PM   Specimen: BLOOD LEFT FOREARM  Result Value Ref Range Status   Specimen Description   Final    BLOOD LEFT FOREARM Performed at [facility], [place] [address], [place], [place] [address]    Special Requests   Final    BOTTLES DRAWN AEROBIC AND ANAEROBIC Blood Culture results may not be optimal due to an excessive volume of blood received in culture bottles Performed at [facility] [address] [place], [place] [address]    Culture   Final    NO GROWTH 5 DAYS Performed at [facility], [place] [address], [place], [place] [address]    Report Status 08/31/2018 FINAL  Final  Blood Culture (routine x 2)     Status: None (Preliminary result)   Collection Time: 08/31/18  9:50 AM   Specimen: BLOOD  Result Value Ref Range Status   Specimen Description   Final    BLOOD RIGHT  ANTECUBITAL Performed at [facility] [address] [place], [place] [address]    Special Requests   Final    BOTTLES DRAWN AEROBIC AND ANAEROBIC Blood Culture adequate volume Performed at [facility] [address] [place], [place] [address]    Culture   Final    NO GROWTH 4 DAYS Performed at [facility], [place] [address] [place], [place] [address]    Report Status PENDING  Incomplete  Blood Culture (routine x 2)     Status: None (Preliminary result)   Collection Time: 08/31/18  9:50 AM   Specimen: BLOOD  Result Value Ref Range Status   Specimen Description   Final    BLOOD LEFT ANTECUBITAL Performed at [facility] [address], [place], [place] [address]    Special Requests   Final    BOTTLES DRAWN AEROBIC AND ANAEROBIC Blood Culture adequate volume Performed at [facility] [address], [place], [place] [address]    Culture   Final    NO GROWTH 4 DAYS Performed at [facility], [place] [address], [place],  [address]    Report Status PENDING  Incomplete     Medications:    AeroChamber Plus Flo-Vu Medium  1 each Other Once   enoxaparin (LOVENOX) injection  40 mg Subcutaneous Q24H   feeding supplement (ENSURE ENLIVE)  237 mL Oral BID BM   hydrocortisone   Rectal TID   hydrocortisone  25 mg Rectal BID   insulin aspart  0-15 Units Subcutaneous TID WC   insulin aspart  0-5 Units Subcutaneous QHS   lisinopril  10 mg Oral Daily  mouth rinse  15 mL Mouth Rinse BID   methylPREDNISolone (SOLU-MEDROL) injection  40 mg Intravenous Daily   pantoprazole  40 mg Oral Q1200   vitamin C  500 mg Oral Daily   zinc sulfate  220 mg Oral Daily   Continuous Infusions:  remdesivir 100 mg in NS 250 mL Stopped (09/04/18 1105)     LOS: 5 days   Charlynne Cousins  [facility]  09/05/2018, 7:39 AM

## 2018-09-06 LAB — GLUCOSE, CAPILLARY: Glucose-Capillary: 136 mg/dL — ABNORMAL HIGH (ref 70–99)

## 2018-09-06 LAB — C-REACTIVE PROTEIN: CRP: <0.8 mg/dL (ref <1.0)

## 2018-09-06 MED ORDER — HYDROCORTISONE ACETATE 25 MG RE SUPP: 25.0000 mg | Freq: Two times a day (BID) | RECTAL | 0 refills | Status: DC

## 2018-09-06 NOTE — Discharge Summary (Signed)
Physician Discharge Summary  Deborah Haney TMH:[id number] DOB: 1965/07/29 DOA: 08/31/2018  PCP: Girtha Rm, NP-C  Admit date: 08/31/2018 Discharge date: 09/06/2018  Admitted From: home Disposition:  Home  Recommendations for Outpatient Follow-up:  1. Follow up with PCP in 1-2 weeks 2. Please obtain BMP/CBC in one week  Home Health:No Equipment/Devices:None  Discharge Condition:Stable CODE STATUS:Full Diet recommendation: Heart Healthy   Brief/Interim Summary: 53 y.o. female past medical history significant for hypertension obesity and prediabetes mellitus presents to the ED complaining of worsening cough and shortness of breath.  According to the patient she started having symptoms about 9 to 10 days prior to admission was diagnosed with SARS-CoV-2, she also mentions some episodes of rectal bleeding  Discharge Diagnoses:  Active Problems:   HTN (hypertension)   Hemorrhoids   Rectal bleeding   COVID-19 virus infection   Prediabetes   Hypokalemia   Acute on chronic respiratory failure with hypoxia (HCC) Acute respiratory failure hypoxia due to COVID-19 viral infection: She was admitted to the progressive care started on oxygen, IV Remdesivir and IV steroids she completed her 5-day course in-house. She also started vitamin C and zinc. She started slowly improving and she was weaned down to room air.  Hematochezia with a known history of hemorrhoids: There is likely hemorrhoidal bleed her hemoglobin remained stable, rectal exam done by the ED showed large external hemorrhoids. Her on Anusol she will continue as an outpatient as needed.  Essential hypertension: Oral antihypertensive medications were held on admission she will resume these as an outpatient.  History of prediabetes mellitus: With an A1c is 6.4 she required minimal insulin while in the hospital it is likely due to steroids her steroids were stopped. She will follow-up with PCP. She has been advised on diet  and lifestyle modification.  Transaminitis: Likely due to COVID-19, they remain relatively stable and trended down at the end of her hospital stay.  Discharge Instructions  Discharge Instructions    Diet - low sodium heart healthy   Complete by: As directed    Increase activity slowly   Complete by: As directed      Allergies as of 09/06/2018   No Known Allergies     Medication List    STOP taking these medications   HYDROcodone-Chlorpheniramine 5-4 MG/5ML Soln     TAKE these medications   aspirin-acetaminophen-caffeine 250-250-65 MG tablet Commonly known as: EXCEDRIN MIGRAINE Take 2 tablets by mouth every 6 (six) hours as needed for headache.   Contour Monitor w/Device Kit 1 kit by Does not apply route 2 (two) times a day.   glucose blood test strip Commonly known as: Contour Next Test Test twice a day   hydrocortisone 25 MG suppository Commonly known as: ANUSOL-HC Place 1 suppository (25 mg total) rectally 2 (two) times daily.   lisinopril-hydrochlorothiazide 10-12.5 MG tablet Commonly known as: ZESTORETIC Take 1 tablet by mouth daily.   Microlet Lancets Misc Test blood sugar twice a day   mupirocin ointment 2 % Commonly known as: Bactroban Apply thin layer to affected area 3 times daily x 7 days.   ondansetron 4 MG tablet Commonly known as: Zofran Take 1 tablet (4 mg total) by mouth every 8 (eight) hours as needed for nausea or vomiting.   promethazine-dextromethorphan 6.25-15 MG/5ML syrup Commonly known as: PROMETHAZINE-DM Take 5 mLs by mouth every 6 (six) hours as needed for cough.       No Known Allergies  Consultations:  None   Procedures/Studies: Dg Chest [facility]  1 View  Result Date: 08/31/2018 CLINICAL DATA:  Shortness of breath, productive cough, fever. EXAM: PORTABLE CHEST 1 VIEW COMPARISON:  Radiograph of August 26, 2018. FINDINGS: Stable cardiomediastinal silhouette. No pneumothorax is noted. Increased bibasilar opacities are noted  concerning for worsening infiltrates or pneumonia with possible small pleural effusions. Bony thorax is unremarkable. IMPRESSION: Increased bibasilar opacities are noted concerning for worsening infiltrates or pneumonia. Electronically Signed   By: Marijo Conception M.D.   On: 08/31/2018 10:44   Dg Chest Port 1 View  Result Date: 08/26/2018 CLINICAL DATA:  Worsening cough. Covid positive. EXAM: PORTABLE CHEST 1 VIEW COMPARISON:  08/24/2018 FINDINGS: Cardiomediastinal silhouette is normal. Mediastinal contours appear intact. Subtle patchy lower lobe predominant peripheral airspace opacities. Osseous structures are without acute abnormality. Soft tissues are grossly normal. IMPRESSION: Subtle patchy peripheral lower lobe predominant airspace opacities may represent early multifocal viral pneumonia. Electronically Signed   By: Fidela Salisbury M.D.   On: 08/26/2018 18:43   Dg Chest Port 1 View  Result Date: 08/24/2018 CLINICAL DATA:  covid + with increasing symptoms over last week EXAM: PORTABLE CHEST 1 VIEW COMPARISON:  Chest x-rays dated 04/02/2018 and 04/17/2017. FINDINGS: The heart size and mediastinal contours are within normal limits. Both lungs are clear. The visualized skeletal structures are unremarkable. IMPRESSION: No active disease. Electronically Signed   By: Franki Cabot M.D.   On: 08/24/2018 12:29     Subjective: No complaints feels great happy to go home  Discharge Exam: Vitals:   09/06/18 0332 09/06/18 0915  BP: 110/72 110/66  Pulse: 68 87  Resp: 18   Temp: 98.2 F (36.8 C) 98.2 F (36.8 C)  SpO2: 93% 98%   Vitals:   09/05/18 1925 09/06/18 0332 09/06/18 0453 09/06/18 0915  BP: 129/76 110/72  110/66  Pulse: (!) 101 68  87  Resp: 17 18    Temp: 98.4 F (36.9 C) 98.2 F (36.8 C)  98.2 F (36.8 C)  TempSrc: Oral Oral  Oral  SpO2: 97% 93%  98%  Weight:   85.8 kg   Height:        General: Pt is alert, awake, not in acute distress Cardiovascular: RRR, S1/S2 +, no  rubs, no gallops Respiratory: CTA bilaterally, no wheezing, no rhonchi Abdominal: Soft, NT, ND, bowel sounds + Extremities: no edema, no cyanosis    The results of significant diagnostics from this hospitalization (including imaging, microbiology, ancillary and laboratory) are listed below for reference.     Microbiology: Recent Results (from the past 240 hour(s))  Blood Culture (routine x 2)     Status: None   Collection Time: 08/31/18  9:50 AM   Specimen: BLOOD  Result Value Ref Range Status   Specimen Description   Final    BLOOD RIGHT ANTECUBITAL Performed at [facility] [address] [place], [place] [address]    Special Requests   Final    BOTTLES DRAWN AEROBIC AND ANAEROBIC Blood Culture adequate volume Performed at [facility] [address], [place], [place] [address]    Culture   Final    NO GROWTH 5 DAYS Performed at [facility], [place] [address], [place], [place] [address]    Report Status 09/05/2018 FINAL  Final  Blood Culture (routine x 2)     Status: None   Collection Time: 08/31/18  9:50 AM   Specimen: BLOOD  Result Value Ref Range Status   Specimen Description   Final  BLOOD LEFT ANTECUBITAL Performed at [facility] [address], [place], [place] [address]    Special Requests   Final    BOTTLES DRAWN AEROBIC AND ANAEROBIC Blood Culture adequate volume Performed at [facility] [address] [place], [place] [address]    Culture   Final    NO GROWTH 5 DAYS Performed at [facility], [place] [address] [place],  [address]    Report Status 09/05/2018 FINAL  Final     Labs: BNP (last 3 results) No results for input(s): BNP in the last 8760 hours. Basic Metabolic Panel: Recent Labs  Lab 09/01/18 0200 09/02/18 0135 09/03/18 0350 09/04/18 0150 09/05/18 0402  NA 143 137 137 139 138  K 3.8 4.8 4.6 4.6 4.4  CL 101 103 104 104 106  CO2 '23 23 22  23 25  ' GLUCOSE 134* 211* 189* 175* 141*  BUN '7 10 16 17 17  ' CREATININE 0.78 0.75 0.75 0.80 0.79  CALCIUM 9.1 9.0 8.8* 8.9 8.8*  MG 2.1 2.2 2.0  --   --    Liver Function Tests: Recent Labs  Lab 09/01/18 0200 09/02/18 0135 09/03/18 0350 09/04/18 0150 09/05/18 0402  AST 50* 50* 41 44* 28  ALT 71* 75* 71* 79* 74*  ALKPHOS 43 48 43 43 42  BILITOT 0.5 0.5 0.4 0.4 0.2*  PROT 7.0 7.7 7.5 7.5 6.9  ALBUMIN 2.8* 2.8* 2.8* 3.0* 2.9*   No results for input(s): LIPASE, AMYLASE in the last 168 hours. No results for input(s): AMMONIA in the last 168 hours. CBC: Recent Labs  Lab 09/01/18 0200 09/02/18 0135 09/03/18 0350 09/04/18 0150 09/05/18 0402  WBC 4.8 5.7 8.9 9.7 8.5  NEUTROABS 2.9 4.7 7.1 7.2 5.1  HGB 10.0* 10.6* 9.9* 10.4* 10.6*  HCT 30.6* 33.0* 30.9* 31.7* 32.3*  MCV 89.7 89.4 89.3 89.5 89.2  PLT 388 492* 611* 705* 725*   Cardiac Enzymes: Recent Labs  Lab 08/31/18 0950  CKTOTAL 91   BNP: Invalid input(s): POCBNP CBG: Recent Labs  Lab 09/05/18 0724 09/05/18 1154 09/05/18 1634 09/05/18 2101 09/06/18 0912  GLUCAP 112* 342* 217* 254* 136*   D-Dimer No results for input(s): DDIMER in the last 72 hours. Hgb A1c No results for input(s): HGBA1C in the last 72 hours. Lipid Profile No results for input(s): CHOL, HDL, LDLCALC, TRIG, CHOLHDL, LDLDIRECT in the last 72 hours. Thyroid function studies No results for input(s): TSH, T4TOTAL, T3FREE, THYROIDAB in the last 72 hours.  Invalid input(s): FREET3 Anemia work up No results for input(s): VITAMINB12, FOLATE, FERRITIN, TIBC, IRON, RETICCTPCT in the last 72 hours. Urinalysis    Component Value Date/Time   COLORURINE YELLOW 08/31/2018 2200   APPEARANCEUR CLEAR 08/31/2018 2200   LABSPEC 1.006 08/31/2018 2200   LABSPEC 1.015 04/02/2018 1628   PHURINE 6.0 08/31/2018 2200   GLUCOSEU NEGATIVE 08/31/2018 2200   HGBUR NEGATIVE 08/31/2018 2200   BILIRUBINUR NEGATIVE 08/31/2018 2200   BILIRUBINUR negative 04/02/2018  1628   BILIRUBINUR n 01/18/2016 1611   KETONESUR 5 (A) 08/31/2018 2200   PROTEINUR NEGATIVE 08/31/2018 2200   UROBILINOGEN negative 01/18/2016 1611   UROBILINOGEN 0.2 07/14/2013 0253   NITRITE NEGATIVE 08/31/2018 2200   LEUKOCYTESUR NEGATIVE 08/31/2018 2200   Sepsis Labs Invalid input(s): PROCALCITONIN,  WBC,  LACTICIDVEN Microbiology Recent Results (from the past 240 hour(s))  Blood Culture (routine x 2)     Status: None   Collection Time: 08/31/18  9:50 AM   Specimen: BLOOD  Result Value Ref Range Status   Specimen Description   Final    BLOOD RIGHT ANTECUBITAL Performed at [facility] [address] [place], [place] [address]    Special Requests   Final    BOTTLES DRAWN AEROBIC AND ANAEROBIC Blood Culture adequate volume Performed at [facility] [address] [place], [place] [address]    Culture   Final    NO GROWTH 5 DAYS Performed at [facility], [place] [address] [place], [place] [address]    Report Status 09/05/2018 FINAL  Final  Blood Culture (routine x 2)     Status: None   Collection Time: 08/31/18  9:50 AM   Specimen: BLOOD  Result Value Ref Range Status   Specimen Description   Final    BLOOD LEFT ANTECUBITAL Performed at [facility] [address], [place], [place] [address]    Special Requests   Final    BOTTLES DRAWN AEROBIC AND ANAEROBIC Blood Culture adequate volume Performed at [facility] [address], [place], [place] [address]    Culture   Final    NO GROWTH 5 DAYS Performed at [facility], [place] [address], [place], [place] [address]    Report Status 09/05/2018 FINAL  Final     Time coordinating discharge: Over 40 minutes  SIGNED:   Charlynne Cousins, MD  [facility] 09/06/2018, 9:36 AM Pager   If 7PM-7AM, please contact night-coverage [email or web address] Password [email or web address]

## 2018-09-06 NOTE — Discharge Instructions (Signed)
COVID-19 COVID-19 is a respiratory infection that is caused by a virus called severe acute respiratory syndrome coronavirus 2 (SARS-CoV-2). The disease is also known as coronavirus disease or novel coronavirus. In some people, the virus may not cause any symptoms. In others, it may cause a serious infection. The infection can get worse quickly and can lead to complications, such as:  Pneumonia, or infection of the lungs.  Acute respiratory distress syndrome or ARDS. This is fluid build-up in the lungs.  Acute respiratory failure. This is a condition in which there is not enough oxygen passing from the lungs to the body.  Sepsis or septic shock. This is a serious bodily reaction to an infection.  Blood clotting problems.  Secondary infections due to bacteria or fungus. The virus that causes COVID-19 is contagious. This means that it can spread from person to person through droplets from coughs and sneezes (respiratory secretions). What are the causes? This illness is caused by a virus. You may catch the virus by:  Breathing in droplets from an infected person's cough or sneeze.  Touching something, like a table or a doorknob, that was exposed to the virus (contaminated) and then touching your mouth, nose, or eyes. What increases the risk? Risk for infection You are more likely to be infected with this virus if you:  Live in or travel to an area with a COVID-19 outbreak.  Come in contact with a sick person who recently traveled to an area with a COVID-19 outbreak.  Provide care for or live with a person who is infected with COVID-19. Risk for serious illness You are more likely to become seriously ill from the virus if you:  Are 95 years of age or older.  Have a long-term disease that lowers your body's ability to fight infection (immunocompromised).  Live in a nursing home or long-term care facility.  Have a long-term (chronic) disease such as: ? Chronic lung disease, including  chronic obstructive pulmonary disease or asthma ? Heart disease. ? Diabetes. ? Chronic kidney disease. ? Liver disease.  Are obese. What are the signs or symptoms? Symptoms of this condition can range from mild to severe. Symptoms may appear any time from 2 to 14 days after being exposed to the virus. They include:  A fever.  A cough.  Difficulty breathing.  Chills.  Muscle pains.  A sore throat.  Loss of taste or smell. Some people may also have stomach problems, such as nausea, vomiting, or diarrhea. Other people may not have any symptoms of COVID-19. How is this diagnosed? This condition may be diagnosed based on:  Your signs and symptoms, especially if: ? You live in an area with a COVID-19 outbreak. ? You recently traveled to or from an area where the virus is common. ? You provide care for or live with a person who was diagnosed with COVID-19.  A physical exam.  Lab tests, which may include: ? A nasal swab to take a sample of fluid from your nose. ? A throat swab to take a sample of fluid from your throat. ? A sample of mucus from your lungs (sputum). ? Blood tests.  Imaging tests, which may include, X-rays, CT scan, or ultrasound. How is this treated? At present, there is no medicine to treat COVID-19. Medicines that treat other diseases are being used on a trial basis to see if they are effective against COVID-19. Your health care provider will talk with you about ways to treat your symptoms. For most  people, the infection is mild and can be managed at home with rest, fluids, and over-the-counter medicines. °Treatment for a serious infection usually takes places in a hospital intensive care unit (ICU). It may include one or more of the following treatments. These treatments are given until your symptoms improve. °· Receiving fluids and medicines through an IV. °· Supplemental oxygen. Extra oxygen is given through a tube in the nose, a face mask, or a  hood. °· Positioning you to lie on your stomach (prone position). This makes it easier for oxygen to get into the lungs. °· Continuous positive airway pressure (CPAP) or bi-level positive airway pressure (BPAP) machine. This treatment uses mild air pressure to keep the airways open. A tube that is connected to a motor delivers oxygen to the body. °· Ventilator. This treatment moves air into and out of the lungs by using a tube that is placed in your windpipe. °· Tracheostomy. This is a procedure to create a hole in the neck so that a breathing tube can be inserted. °· Extracorporeal membrane oxygenation (ECMO). This procedure gives the lungs a chance to recover by taking over the functions of the heart and lungs. It supplies oxygen to the body and removes carbon dioxide. °Follow these instructions at home: °Lifestyle °· If you are sick, stay home except to get medical care. Your health care provider will tell you how long to stay home. Call your health care provider before you go for medical care. °· Rest at home as told by your health care provider. °· Do not use any products that contain nicotine or tobacco, such as cigarettes, e-cigarettes, and chewing tobacco. If you need help quitting, ask your health care provider. °· Return to your normal activities as told by your health care provider. Ask your health care provider what activities are safe for you. °General instructions °· Take over-the-counter and prescription medicines only as told by your health care provider. °· Drink enough fluid to keep your urine pale yellow. °· Keep all follow-up visits as told by your health care provider. This is important. °How is this prevented? ° °There is no vaccine to help prevent COVID-19 infection. However, there are steps you can take to protect yourself and others from this virus. °To protect yourself:  °· Do not travel to areas where COVID-19 is a risk. The areas where COVID-19 is reported change often. To identify  high-risk areas and travel restrictions, check the CDC travel website: wwwnc.cdc.gov/travel/notices °· If you live in, or must travel to, an area where COVID-19 is a risk, take precautions to avoid infection. °? Stay away from people who are sick. °? Wash your hands often with soap and water for 20 seconds. If soap and water are not available, use an alcohol-based hand sanitizer. °? Avoid touching your mouth, face, eyes, or nose. °? Avoid going out in public, follow guidance from your state and local health authorities. °? If you must go out in public, wear a cloth face covering or face mask. °? Disinfect objects and surfaces that are frequently touched every day. This may include: °§ Counters and tables. °§ Doorknobs and light switches. °§ Sinks and faucets. °§ Electronics, such as phones, remote controls, keyboards, computers, and tablets. °To protect others: °If you have symptoms of COVID-19, take steps to prevent the virus from spreading to others. °· If you think you have a COVID-19 infection, contact your health care provider right away. Tell your health care team that you think you may   have a COVID-19 infection. °· Stay home. Leave your house only to seek medical care. Do not use public transport. °· Do not travel while you are sick. °· Wash your hands often with soap and water for 20 seconds. If soap and water are not available, use alcohol-based hand sanitizer. °· Stay away from other members of your household. Let healthy household members care for children and pets, if possible. If you have to care for children or pets, wash your hands often and wear a mask. If possible, stay in your own room, separate from others. Use a different bathroom. °· Make sure that all people in your household wash their hands well and often. °· Cough or sneeze into a tissue or your sleeve or elbow. Do not cough or sneeze into your hand or into the air. °· Wear a cloth face covering or face mask. °Where to find more  information °· Centers for Disease Control and Prevention: www.cdc.gov/coronavirus/2019-ncov/index.html °· World Health Organization: www.who.int/health-topics/coronavirus °Contact a health care provider if: °· You live in or have traveled to an area where COVID-19 is a risk and you have symptoms of the infection. °· You have had contact with someone who has COVID-19 and you have symptoms of the infection. °Get help right away if: °· You have trouble breathing. °· You have pain or pressure in your chest. °· You have confusion. °· You have bluish lips and fingernails. °· You have difficulty waking from sleep. °· You have symptoms that get worse. °These symptoms may represent a serious problem that is an emergency. Do not wait to see if the symptoms will go away. Get medical help right away. Call your local emergency services (911 in the [place] Do not drive yourself to the hospital. Let the emergency medical personnel know if you think you have COVID-19. °Summary °· COVID-19 is a respiratory infection that is caused by a virus. It is also known as coronavirus disease or novel coronavirus. It can cause serious infections, such as pneumonia, acute respiratory distress syndrome, acute respiratory failure, or sepsis. °· The virus that causes COVID-19 is contagious. This means that it can spread from person to person through droplets from coughs and sneezes. °· You are more likely to develop a serious illness if you are 65 years of age or older, have a weak immunity, live in a nursing home, or have chronic disease. °· There is no medicine to treat COVID-19. Your health care provider will talk with you about ways to treat your symptoms. °· Take steps to protect yourself and others from infection. Wash your hands often and disinfect objects and surfaces that are frequently touched every day. Stay away from people who are sick and wear a mask if you are sick. °This information is not intended to replace advice given to you by  your health care provider. Make sure you discuss any questions you have with your health care provider. °Document Released: 03/29/2018 Document Revised: 07/19/2018 Document Reviewed: 03/29/2018 °Elsevier Patient Education © 2020 Elsevier Inc. ° °COVID-19: How to Protect Yourself and Others °Know how it spreads °· There is currently no vaccine to prevent coronavirus disease 2019 (COVID-19). °· The best way to prevent illness is to avoid being exposed to this virus. °· The virus is thought to spread mainly from person-to-person. °? Between people who are in close contact with one another (within about 6 feet). °? Through respiratory droplets produced when an infected person coughs, sneezes or talks. °? These droplets can land in   the mouths or noses of people who are nearby or possibly be inhaled into the lungs. ? Some recent studies have suggested that COVID-19 may be spread by people who are not showing symptoms. Everyone should Clean your hands often  Wash your hands often with soap and water for at least 20 seconds especially after you have been in a public place, or after blowing your nose, coughing, or sneezing.  If soap and water are not readily available, use a hand sanitizer that contains at least 60% alcohol. Cover all surfaces of your hands and rub them together until they feel dry.  Avoid touching your eyes, nose, and mouth with unwashed hands. Avoid close contact  Stay home if you are sick.  Avoid close contact with people who are sick.  Put distance between yourself and other people. ? Remember that some people without symptoms may be able to spread virus. ? This is especially important for people who are at higher risk of getting very [employer]ain[employer].com.cy Cover your mouth and nose with a cloth face cover when around others  You could spread COVID-19 to others even if you do not feel sick.  Everyone should wear a  cloth face cover when they have to go out in public, for example to the grocery store or to pick up other necessities. ? Cloth face coverings should not be placed on young children under age 77, anyone who has trouble breathing, or is unconscious, incapacitated or otherwise unable to remove the mask without assistance.  The cloth face cover is meant to protect other people in case you are infected.  Do NOT use a facemask meant for a Dietitian.  Continue to keep about 6 feet between yourself and others. The cloth face cover is not a substitute for social distancing. Cover coughs and sneezes  If you are in a private setting and do not have on your cloth face covering, remember to always cover your mouth and nose with a tissue when you cough or sneeze or use the inside of your elbow.  Throw used tissues in the trash.  Immediately wash your hands with soap and water for at least 20 seconds. If soap and water are not readily available, clean your hands with a hand sanitizer that contains at least 60% alcohol. Clean and disinfect  Clean AND disinfect frequently touched surfaces daily. This includes tables, doorknobs, light switches, countertops, handles, desks, phones, keyboards, toilets, faucets, and sinks. [email or web address].[email or web address]  If surfaces are dirty, clean them: Use detergent or soap and water prior to disinfection.  Then, use a household disinfectant. You can see a list of EPA-registered household disinfectants here. michellinders.com 07/10/2018 This information is not intended to replace advice given to you by your health care provider. Make sure you discuss any questions you have with your health care provider. Document Released: 06/19/2018 Document Revised: 07/18/2018 Document Reviewed: 06/19/2018 Elsevier Patient Education  2020 [place] was admitted to the Hospital on 08/31/2018 and  Discharged on Discharge Date 09/06/2018 and should be excused from work/school   for  13 days starting 08/31/2018 , may return to work/school without any restrictions.  Call Bess Harvest MD, [facility] [phone] with questions.  Charlynne Cousins M.D on 09/06/2018,at 11:35 AM  [facility] Office  [phone]

## 2018-09-07 ENCOUNTER — Encounter: Payer: Self-pay | Admitting: Family Medicine

## 2018-09-10 ENCOUNTER — Telehealth: Payer: Self-pay | Admitting: Family Medicine

## 2018-09-10 NOTE — Telephone Encounter (Signed)
Pt has a appt. Request for a after covid follow up so she can go back to work. How did you want me to schedule that?

## 2018-09-10 NOTE — Telephone Encounter (Signed)
This needs to be virtual visit please. Also, for the forms she needs filled out, see if she can fax or have someone drop off the hard copies or if we can print out the ones she attached in my chart. Thanks. Check with Gabriel Cirri about the forms, I also asked her about this.

## 2018-09-10 NOTE — Telephone Encounter (Signed)
I have completed the transition of care as a hospital follow-up. All meds are the same on med list.   I have printed forms.

## 2018-09-11 ENCOUNTER — Encounter: Payer: Self-pay | Admitting: Family Medicine

## 2018-09-11 ENCOUNTER — Other Ambulatory Visit: Payer: Self-pay

## 2018-09-11 ENCOUNTER — Ambulatory Visit (INDEPENDENT_AMBULATORY_CARE_PROVIDER_SITE_OTHER): Payer: BC Managed Care – PPO | Admitting: Family Medicine

## 2018-09-11 VITALS — BP 122/75 | HR 84 | Temp 97.4°F | Resp 16

## 2018-09-11 DIAGNOSIS — U071 COVID-19: Secondary | ICD-10-CM

## 2018-09-11 DIAGNOSIS — K625 Hemorrhage of anus and rectum: Secondary | ICD-10-CM

## 2018-09-11 DIAGNOSIS — K649 Unspecified hemorrhoids: Secondary | ICD-10-CM

## 2018-09-11 DIAGNOSIS — I10 Essential (primary) hypertension: Secondary | ICD-10-CM

## 2018-09-11 DIAGNOSIS — Z09 Encounter for follow-up examination after completed treatment for conditions other than malignant neoplasm: Secondary | ICD-10-CM

## 2018-09-11 DIAGNOSIS — E119 Type 2 diabetes mellitus without complications: Secondary | ICD-10-CM

## 2018-09-11 DIAGNOSIS — R05 Cough: Secondary | ICD-10-CM | POA: Diagnosis not present

## 2018-09-11 DIAGNOSIS — E785 Hyperlipidemia, unspecified: Secondary | ICD-10-CM

## 2018-09-11 DIAGNOSIS — R059 Cough, unspecified: Secondary | ICD-10-CM

## 2018-09-11 NOTE — Progress Notes (Addendum)
Subjective:   Documentation for virtual audio and video telecommunications through [place] encounter:  The patient was located at home. 2 patient identifiers used.  The provider was located in the office. The patient did consent to this visit and is aware of possible charges through their insurance for this visit.  The other persons participating in this telemedicine service were none.    Patient ID: Deborah Haney, female    DOB: May 06, 1965, 53 y.o.   MRN: [id number]  HPI Chief Complaint  Patient presents with  . hospital follow-up    hospital follow-up   This is a hospital follow up. She was diagnosed with Covid-19 infection and actually symptomatic for 9-10 days before she presented to the hospital with hypoxia on 08/31/2018. She was hospitalized until 09/06/2018.  Treatment for Covid-19 included Remdesivir and IV steroids. On room air and doing fine.   She has been monitoring her temperature and no fever since 08/30/2018 States she feels approximately 50% her baseline. States her energy level is still low.   States she is getting in a routine by getting up and planning out her day but she is feeling very tired by lunch and has to rest.   Hemorrhoids and rectal bleeding. Using Anusol. States she has an appt with GI since it is time for her colonoscopy. History of banding done by GI.   HTN- started back on medication. BP well controlled.   Prediabetes- A1c 6.4% in November 2019 Her BS while in the hospital was elevated due to IV steroids.  BS at home trending downward.   Denies fever, chills, dizziness, chest pain, palpitations, shortness of breath, abdominal pain, N/V/D, urinary symptoms, LE edema.    Elevated liver enzymes likely due to Covid-19 and trending downward prior to discharge.   Past Medical History:  Diagnosis Date  . Acute cholecystitis 07/14/2013  . Anemia    has history, takes iron  . Anxiety   . Bipolar affective disorder ([place]) 10/26/2015   Has seen  counselor, declined medications per medical record from [facility]. hypermanic  . Chest pain 07/14/2013  . Chronic vaginitis 10/26/2015   Has h/o vag dryness and tried Estrace per [facility] records  . Depression   . Diabetes [facility])    Patient denies  . Diverticulitis    no problems  . Dysfunctional uterine bleeding 03/02/2011  . Elevated hemoglobin A1c 10/26/2015  . GERD (gastroesophageal reflux disease)    tums prn  . Headache(784.0)   . Hemorrhoids   . HTN (hypertension)    BP meds since fall 2016  . Internal hemorrhoids   . Nausea & vomiting 07/14/2013  . Obesity 10/26/2015  . Rectal bleeding 03/15/2017    Past Surgical History:  Procedure Laterality Date  . CHOLECYSTECTOMY N/A 07/15/2013   Procedure: LAPAROSCOPIC CHOLECYSTECTOMY;  Surgeon: Rolm Bookbinder, MD;  Location: [place];  Service: General;  Laterality: N/A;  . COLONOSCOPY    . ECTOPIC PREGNANCY SURGERY     laparotomy  . ROBOTIC ASSISTED LAP VAGINAL HYSTERECTOMY      Current Outpatient Medications on File Prior to Visit  Medication Sig Dispense Refill  . aspirin-acetaminophen-caffeine (EXCEDRIN MIGRAINE) 250-250-65 MG tablet Take 2 tablets by mouth every 6 (six) hours as needed for headache.    . Blood Glucose Monitoring Suppl (CONTOUR MONITOR) w/Device KIT 1 kit by Does not apply route 2 (two) times a day. 1 kit 0  . glucose blood (CONTOUR NEXT TEST) test strip Test twice a day 100 each 1  .  hydrocortisone (ANUSOL-HC) 25 MG suppository Place 1 suppository (25 mg total) rectally 2 (two) times daily. 12 suppository 0  . lisinopril-hydrochlorothiazide (ZESTORETIC) 10-12.5 MG tablet Take 1 tablet by mouth daily. 30 tablet 4  . Microlet Lancets MISC Test blood sugar twice a day 100 each 1  . mupirocin ointment (BACTROBAN) 2 % Apply thin layer to affected area 3 times daily x 7 days. 22 g 0  . ondansetron (ZOFRAN) 4 MG tablet Take 1 tablet (4 mg total) by mouth every 8 (eight) hours as needed for nausea or vomiting. 10 tablet 0   No  current facility-administered medications on file prior to visit.     Reviewed allergies, medications, past medical, surgical, family, and social history.    Review of Systems Pertinent positives and negatives in the history of present illness.     Objective:   Physical Exam BP 122/75   Pulse 84   Temp (!) 97.4 F (36.3 C) (Oral)   LMP 04/25/2011   Alert and oriented and in no acute distress. Respirations unlabored. Normal speech, mood and thought process. Unable to further examine due to virtual visit.       Assessment & Plan:  Hospital discharge follow-up - Plan: hospital stay from 08/31/2018- 09/06/2018 for acute respiratory failure and hypoxia related to Covid-19 infection. She reports being approximately 50% back to her baseline. Doing well on room air but still coughing when walking long distances. Fatigue is her main concern. Reviewed labs, imaging and medications from hospital stay and discharge summary.   COVID-19 virus infection - Plan: no fever since 08/30/2018. She will continue to monitor symptoms. Discussed that it may take a while for her energy level to return and cough to completely resolve. I will hold her out of work until our follow up next week.   Cough - Plan: improving.   Rectal bleeding - Plan: due to hemorrhoids with history of same related to hemorrhoids. Will see GI. Continue using Anusol in the meantime.   Hemorrhoids, unspecified hemorrhoid type - Plan: use Anusol and see GI as scheduled.   Essential hypertension - Plan: BP controlled. Continue medications.   Controlled type 2 diabetes mellitus without complication, without long-term current use of insulin ([place]) - Plan: BS have been more elevated than usual due to IV steroids in the hospital. She is starting to see them return to her usual. Continue with healthy diet modifications and activity level as tolerated. Recheck A1c next week.   Dyslipidemia - Plan: will need to recheck lipids at follow up  or CPE.   She needs me to fill out short term disability paperwork. I am holding her out of work until our follow up in office visit next week.   Time spent on call was 25 minutes and in review of previous records 5 minutes total.  This virtual service is not related to other E/M service within previous 7 days.

## 2018-09-11 NOTE — Addendum Note (Signed)
Addended by: Girtha Rm on: 09/11/2018 06:46 PM   Modules accepted: Level of Service

## 2018-09-13 ENCOUNTER — Other Ambulatory Visit: Payer: Self-pay | Admitting: Internal Medicine

## 2018-09-13 DIAGNOSIS — U071 COVID-19: Secondary | ICD-10-CM

## 2018-09-13 NOTE — Progress Notes (Unsigned)
Pt has an appt next week for follow-up from recent + covid-19. Needs repeat before she can come in.   order and pec staff message has been placed.

## 2018-09-14 ENCOUNTER — Other Ambulatory Visit: Payer: BC Managed Care – PPO

## 2018-09-14 ENCOUNTER — Telehealth: Payer: Self-pay | Admitting: *Deleted

## 2018-09-14 DIAGNOSIS — R6889 Other general symptoms and signs: Secondary | ICD-10-CM | POA: Diagnosis not present

## 2018-09-14 DIAGNOSIS — Z20822 Contact with and (suspected) exposure to covid-19: Secondary | ICD-10-CM

## 2018-09-14 NOTE — Telephone Encounter (Signed)
-----   Message from Koren Bound, [place] sent at 09/13/2018  3:40 PM EDT ----- Pt needs to be tested for coronavirus.    Gabriel Cirri

## 2018-09-14 NOTE — Telephone Encounter (Signed)
Spoke with patient, scheduled her for COVID 19 test today at 9:30 am at [place] at [facility] is full.  Testing protocol reviewed with patient.

## 2018-09-20 ENCOUNTER — Inpatient Hospital Stay: Payer: BC Managed Care – PPO | Admitting: Family Medicine

## 2018-09-21 ENCOUNTER — Encounter: Payer: Self-pay | Admitting: Internal Medicine

## 2018-09-21 LAB — NOVEL CORONAVIRUS, NAA: SARS-CoV-2, NAA: NOT DETECTED

## 2018-09-24 ENCOUNTER — Ambulatory Visit (INDEPENDENT_AMBULATORY_CARE_PROVIDER_SITE_OTHER): Payer: BC Managed Care – PPO | Admitting: Family Medicine

## 2018-09-24 ENCOUNTER — Ambulatory Visit
Admission: RE | Admit: 2018-09-24 | Discharge: 2018-09-24 | Disposition: A | Discharge status: Home / Self Care | Payer: BC Managed Care – PPO | Source: Ambulatory Visit | Attending: Family Medicine | Admitting: Family Medicine

## 2018-09-24 ENCOUNTER — Encounter: Payer: Self-pay | Admitting: Family Medicine

## 2018-09-24 ENCOUNTER — Encounter: Payer: Self-pay | Admitting: Internal Medicine

## 2018-09-24 ENCOUNTER — Other Ambulatory Visit: Payer: Self-pay

## 2018-09-24 VITALS — BP 122/80 | HR 81 | Temp 97.4°F

## 2018-09-24 DIAGNOSIS — R079 Chest pain, unspecified: Secondary | ICD-10-CM | POA: Diagnosis not present

## 2018-09-24 DIAGNOSIS — R0789 Other chest pain: Secondary | ICD-10-CM | POA: Diagnosis not present

## 2018-09-24 DIAGNOSIS — Z8701 Personal history of pneumonia (recurrent): Secondary | ICD-10-CM | POA: Diagnosis not present

## 2018-09-24 DIAGNOSIS — Z8616 Personal history of COVID-19: Secondary | ICD-10-CM

## 2018-09-24 DIAGNOSIS — Z8619 Personal history of other infectious and parasitic diseases: Secondary | ICD-10-CM | POA: Diagnosis not present

## 2018-09-24 NOTE — Progress Notes (Signed)
   Subjective:   Documentation for virtual audio and video telecommunications through [place] encounter:  The patient was located at home. 2 patient identifiers used.  The provider was located at home.  The patient did consent to this visit and is aware of possible charges through their insurance for this visit.  The other persons participating in this telemedicine service were none.    Patient ID: Deborah Haney, female    DOB: 1965/06/16, 53 y.o.   MRN: [id number]  HPI Chief Complaint  Patient presents with  . follow-up visit for covid    need letter to return to work since she is negative for covid   This visit is to follow up on covid-19 illness. Diagnosed with Covid-19 last month and hospitalized. She has been home for the past 18 days and feels basically at her baseline. She does still have some mild fatigue and decreased appetite.  She has not had a fever in at least 3 weeks.   Right sided chest pain has not improved since being hospitalized. She did have evidence of pneumonia on chest XR 08/31/2018.  States movement and deep inspiration worsens her pain. No fever, chills, cough, shortness of breath.  History of similar chest pain and has seen cardiology. States she has meloxicam at home for this pain and would like to try it.   States blood sugar is normalized now.   Denies fever, chills, dizziness, palpitations, shortness of breath, abdominal pain, N/V/D, urinary symptoms, LE edema.   Reviewed allergies, medications, past medical, surgical, family, and social history.   Review of Systems Pertinent positives and negatives in the history of present illness.     Objective:   Physical Exam BP 122/80   Pulse 81   Temp (!) 97.4 F (36.3 C) (Oral)   LMP 04/25/2011   Alert and oriented and in no acute distress.  Respirations unlabored.  States chest wall is nontender when she presses on it.  Unable to examine further due to this being a virtual visit      Assessment  & Plan:  Right-sided chest pain - Plan: DG Chest 2 View.  See note below  History of pneumonia - Plan: DG Chest 2 View  History of 2019 novel coronavirus disease (COVID-19) - Plan: DG Chest 2 View.  Recovered.  Follow-up test negative and asymptomatic.  Discussed limitations of a virtual visit.  She is not in any acute distress.  She is afebrile without any URI symptoms or cough for at least 2 weeks. Repeat COVID-19 testing was negative. Mild fatigue and decreased appetite are her only lingering complaints Suspect her right sided chest pain is MSK related but due to history of pneumonia with her previous chest XR on 08/31/2018, I will order a follow up chest XR. She would like to donate her plasma and I advised her to contact [facility] plasma donation to discuss this.   Return to work note provided.   Time spent on call was 15 minutes and in review of previous records 2 minutes total.  This virtual service is not related to other E/M service within previous 7 days.

## 2018-09-26 ENCOUNTER — Inpatient Hospital Stay: Payer: BC Managed Care – PPO | Admitting: Family Medicine

## 2018-10-09 ENCOUNTER — Other Ambulatory Visit: Payer: Self-pay | Admitting: Gastroenterology

## 2018-10-09 ENCOUNTER — Encounter: Payer: Self-pay | Admitting: Gastroenterology

## 2018-10-09 ENCOUNTER — Ambulatory Visit (INDEPENDENT_AMBULATORY_CARE_PROVIDER_SITE_OTHER): Payer: BC Managed Care – PPO | Admitting: Gastroenterology

## 2018-10-09 ENCOUNTER — Telehealth: Payer: Self-pay | Admitting: Gastroenterology

## 2018-10-09 DIAGNOSIS — K625 Hemorrhage of anus and rectum: Secondary | ICD-10-CM | POA: Diagnosis not present

## 2018-10-09 DIAGNOSIS — K648 Other hemorrhoids: Secondary | ICD-10-CM | POA: Diagnosis not present

## 2018-10-09 MED ORDER — PEG 3350-KCL-NA BICARB-NACL 420 G PO SOLR: 4000.0000 mL | ORAL | 0 refills | Status: DC

## 2018-10-09 NOTE — Telephone Encounter (Signed)
Spoke to patient instructions mailed out she will call if she has any questions

## 2018-10-09 NOTE — Patient Instructions (Signed)
She will start citrucel orange flavored powder fiber supplement on an every day basis.  We will arrange for screening colonoscopy at her soonest convenience.

## 2018-10-09 NOTE — Telephone Encounter (Signed)
Pt is scheduled for 8/31 9:30 AM colon.  Please mail instruction letter to pt.

## 2018-10-09 NOTE — Progress Notes (Signed)
Review of pertinent gastrointestinal problems: 1.  Internal hemorrhoids.  Colonoscopy in November 2014 at [facility] for the intermittent bleeding.  She was found to have internal hemorrhoids.  Repeat was recommended in 5 years due to suboptimal prep.   She was in our office January 2019 with further bleeding.  Dr.Pyrtle assisted with her care.  He felt that she had symptomatic grade 2 internal hemorrhoids and he placed a band on the right posterior internal hemorrhoid.  This was repeated 1 month later LL internal hemorrhoid was banded.  She never completed the third banding.  I am not sure why  2.  Routine risk for colon cancer; she was to have a colonoscopy with me in 2019, around November however that never occurred. I am not sure why.   This service was provided via virtual visit.  I tried both audio and visual however kept flashing in and out.  It was quite confusing and so I ended up just using audio.  The patient was located at work, I think.  I was located in my office.  The patient did consent to this virtual visit and is aware of possible charges through their insurance for this visit.  The patient is an established patient.  My certified medical assistant, Grace Bushy, contributed to this visit by contacting the patient by phone 1 or 2 business days prior to the appointment and also followed up on the recommendations I made after the visit.  Time spent on virtual visit: 22 minutes   HPI: This is a very pleasant 53 year old woman who was here in our office in 2018, 2019 for internal hemorrhoid banding.  She never completed the course, she only underwent 2 of the banding's but did feel that those 2 banding's helped decrease her bleeding significantly.  She was admitted for a 5 or 6 night stay in late June, early July for COVID 9.  She had cough and shortness of breath.  Viral testing was positive.  Coronavirus +August 21, 2018, repeat coronavirus testing September 14, 2018 was negative.  CBC June 2020  hemoglobin 11.7  She said her experience with coronavirus was terrible, worse than flu or pneumonia. She was out of work for a month.  103 tmax. Her biggest issue was severe fatigue. Her ox level was in 70s.  Her anus was swollen, she was very constipated. She had bleeding daily. Suppositories didn't work very well.  She feels overall back to normal except for her energy level. She feels her reflexes and cognition are not back to 100% yet.  Feels she cannot mutlitask like she used to.  Her hemorrhoids have really improved.  She is taking probiotics, used hemorrhoidal creams. She is still a bit constipated (1-2 times per week).  She used to have a BM daily every morning.  Chief complaint is routine risk for colon cancer, minor change in bowel habits, minor constipation  ROS: complete GI ROS as described in HPI, all other review negative.  Constitutional:  No unintentional weight loss   Past Medical History:  Diagnosis Date  . Acute cholecystitis 07/14/2013  . Anemia    has history, takes iron  . Anxiety   . Bipolar affective disorder ([place]) 10/26/2015   Has seen counselor, declined medications per medical record from [facility]. hypermanic  . Chest pain 07/14/2013  . Chronic vaginitis 10/26/2015   Has h/o vag dryness and tried Estrace per [place] records  . Depression   . Diabetes [facility])    Patient denies  .  Diverticulitis    no problems  . Dysfunctional uterine bleeding 03/02/2011  . Elevated hemoglobin A1c 10/26/2015  . GERD (gastroesophageal reflux disease)    tums prn  . Headache(784.0)   . Hemorrhoids   . HTN (hypertension)    BP meds since fall 2016  . Internal hemorrhoids   . Nausea & vomiting 07/14/2013  . Obesity 10/26/2015  . Rectal bleeding 03/15/2017    Past Surgical History:  Procedure Laterality Date  . CHOLECYSTECTOMY N/A 07/15/2013   Procedure: LAPAROSCOPIC CHOLECYSTECTOMY;  Surgeon: Rolm Bookbinder, MD;  Location: [place];  Service: General;  Laterality: N/A;  .  COLONOSCOPY    . ECTOPIC PREGNANCY SURGERY     laparotomy  . ROBOTIC ASSISTED LAP VAGINAL HYSTERECTOMY      Current Outpatient Medications  Medication Sig Dispense Refill  . aspirin-acetaminophen-caffeine (EXCEDRIN MIGRAINE) 250-250-65 MG tablet Take 2 tablets by mouth every 6 (six) hours as needed for headache.    . Blood Glucose Monitoring Suppl (CONTOUR MONITOR) w/Device KIT 1 kit by Does not apply route 2 (two) times a day. 1 kit 0  . glucose blood (CONTOUR NEXT TEST) test strip Test twice a day 100 each 1  . hydrocortisone (ANUSOL-HC) 25 MG suppository Place 1 suppository (25 mg total) rectally 2 (two) times daily. 12 suppository 0  . lisinopril-hydrochlorothiazide (ZESTORETIC) 10-12.5 MG tablet Take 1 tablet by mouth daily. 30 tablet 4  . Microlet Lancets MISC Test blood sugar twice a day 100 each 1  . mupirocin ointment (BACTROBAN) 2 % Apply thin layer to affected area 3 times daily x 7 days. 22 g 0  . ondansetron (ZOFRAN) 4 MG tablet Take 1 tablet (4 mg total) by mouth every 8 (eight) hours as needed for nausea or vomiting. 10 tablet 0   No current facility-administered medications for this visit.     Allergies as of 10/09/2018  . (No Known Allergies)    Family History  Problem Relation Age of Onset  . Diabetes Mother   . Hypertension Mother   . Arthritis Father   . Hypertension Brother   . Cancer Brother        "in his blood"  . Diabetes Brother        diet controlled  . Cancer Brother        unsure kind  . CVA Other   . Colon cancer Neg Hx   . Colon polyps Neg Hx   . Esophageal cancer Neg Hx   . Rectal cancer Neg Hx   . Stomach cancer Neg Hx     Social History   Socioeconomic History  . Marital status: Divorced    Spouse name: Not on file  . Number of children: 0  . Years of education: Not on file  . Highest education level: Not on file  Occupational History  . Occupation: Education officer, museum, Chief of Staff  Social Needs  . Financial resource strain: Not  on file  . Food insecurity    Worry: Not on file    Inability: Not on file  . Transportation needs    Medical: Not on file    Non-medical: Not on file  Tobacco Use  . Smoking status: Former Smoker    Types: Cigarettes    Quit date: 04/18/2008    Years since quitting: 10.4  . Smokeless tobacco: Never Used  Substance and Sexual Activity  . Alcohol use: Yes    Comment: 1 glass of wine rarely, occasionally  . Drug use: No  Types: Cocaine    Comment: Former cocaine use since 2012  . Sexual activity: Not on file  Lifestyle  . Physical activity    Days per week: Patient refused    Minutes per session: Patient refused  . Stress: Not on file  Relationships  . Social Herbalist on phone: Patient refused    Gets together: Patient refused    Attends religious service: Patient refused    Active member of club or organization: Patient refused    Attends meetings of clubs or organizations: Patient refused    Relationship status: Patient refused  . Intimate partner violence    Fear of current or ex partner: Patient refused    Emotionally abused: Patient refused    Physically abused: Patient refused    Forced sexual activity: Patient refused  Other Topics Concern  . Not on file  Social History Narrative  . Not on file     Physical Exam: Unable to perform because this was a "telemed visit" due to current Covid-19 pandemic  Assessment and plan: 53 y.o. female with routine risk for colon cancer, minor change in bowel habits, internal hemorrhoids  She survived coronavirus infection but while she was ill with that she had a significant flare of hemorrhoids and she has been constipated starting around then and ever since then.  Her bowels are a bit better but certainly not back to normal.  I recommended she start taking a fiber supplement on every day basis in addition to the probiotic that she had started.  She needs screening colonoscopy and we will arrange for that to be  done at her soonest convenience.  She did have a colonoscopy about 6 years ago at [facility] but they documented a suboptimal prep.  I will certainly give a good examination of her hemorrhoids.  It sounds like she has had some problems with external hemorrhoids recently.  If she still has a component of internal hemorrhoids after her 2019 banding procedures and I will likely arrange for her to meet with my partner again to complete the banding series.  Please see the "Patient Instructions" section for addition details about the plan.  Owens Loffler, MD [facility] 10/09/2018, 2:17 PM

## 2018-10-12 ENCOUNTER — Other Ambulatory Visit: Payer: Self-pay

## 2018-10-12 DIAGNOSIS — Z20822 Contact with and (suspected) exposure to covid-19: Secondary | ICD-10-CM

## 2018-10-13 LAB — NOVEL CORONAVIRUS, NAA: SARS-CoV-2, NAA: NOT DETECTED

## 2018-10-16 NOTE — Patient Instructions (Addendum)
Remember to call and schedule your mammogram for January 2021.  [employer] will call you regarding the vaccine trial.   You will need a pneumonia vaccine in the next few months.   You appear to have yeast so I sent in a Diflucan tablet to take for this.   You have protein in your urine. This has been the case in the past as well. We will check the 24 hour urine to see how much protein you are spilling.   We will forward your lab results.   You can call to schedule your appointment with a counselor. A few offices are listed below for you to call.    [facility]  (across from [facility])  [facility] [address] [place], [place] [address] [address], [place], [place] [address]  Phone: [phone]   [place], [place] [address]  Phone: [phone]   Preventive Care 34-20 Years Old, Female Preventive care refers to visits with your health care provider and lifestyle choices that can promote health and wellness. This includes:  A yearly physical exam. This may also be called an annual well check.  Regular dental visits and eye exams.  Immunizations.  Screening for certain conditions.  Healthy lifestyle choices, such as eating a healthy diet, getting regular exercise, not using drugs or products that contain nicotine and tobacco, and limiting alcohol use. What can I expect for my preventive care visit? Physical exam Your health care provider will check your:  Height and weight. This may be used to calculate body mass index (BMI), which tells if you are at a healthy weight.  Heart rate and blood pressure.  Skin for abnormal spots. Counseling Your health care provider may ask you questions about your:  Alcohol, tobacco, and drug use.   Emotional well-being.  Home and relationship well-being.  Sexual activity.  Eating habits.  Work and work Statistician.  Method of birth control.  Menstrual cycle.  Pregnancy history. What immunizations do I need?  Influenza (flu) vaccine  This is recommended every year. Tetanus, diphtheria, and pertussis (Tdap) vaccine  You may need a Td booster every 10 years. Varicella (chickenpox) vaccine  You may need this if you have not been vaccinated. Zoster (shingles) vaccine  You may need this after age 17. Measles, mumps, and rubella (MMR) vaccine  You may need at least one dose of MMR if you were born in 1957 or later. You may also need a second dose. Pneumococcal conjugate (PCV13) vaccine  You may need this if you have certain conditions and were not previously vaccinated. Pneumococcal polysaccharide (PPSV23) vaccine  You may need one or two doses if you smoke cigarettes or if you have certain conditions. Meningococcal conjugate (MenACWY) vaccine  You may need this if you have certain conditions. Hepatitis A vaccine  You may need this if you have certain conditions or if you travel or work in places where you may be exposed to hepatitis A. Hepatitis B vaccine  You may need this if you have certain conditions or if you travel or work in places where you may be exposed to hepatitis B. Haemophilus influenzae type b (Hib) vaccine  You may need this if you have certain conditions. Human papillomavirus (HPV) vaccine  If recommended  by your health care provider, you may need three doses over 6 months. You may receive vaccines as individual doses or as more than one vaccine together in one shot (combination vaccines). Talk with your health care provider about the risks and benefits of combination vaccines. What tests do I need? Blood tests  Lipid and cholesterol levels. These may be checked every 5 years, or more frequently if you are over 43 years old.  Hepatitis C  test.  Hepatitis B test. Screening  Lung cancer screening. You may have this screening every year starting at age 82 if you have a 30-pack-year history of smoking and currently smoke or have quit within the past 15 years.  Colorectal cancer screening. All adults should have this screening starting at age 79 and continuing until age 54. Your health care provider may recommend screening at age 11 if you are at increased risk. You will have tests every 1-10 years, depending on your results and the type of screening test.  Diabetes screening. This is done by checking your blood sugar (glucose) after you have not eaten for a while (fasting). You may have this done every 1-3 years.  Mammogram. This may be done every 1-2 years. Talk with your health care provider about when you should start having regular mammograms. This may depend on whether you have a family history of breast cancer.  BRCA-related cancer screening. This may be done if you have a family history of breast, ovarian, tubal, or peritoneal cancers.  Pelvic exam and Pap test. This may be done every 3 years starting at age 45. Starting at age 77, this may be done every 5 years if you have a Pap test in combination with an HPV test. Other tests  Sexually transmitted disease (STD) testing.  Bone density scan. This is done to screen for osteoporosis. You may have this scan if you are at high risk for osteoporosis. Follow these instructions at home: Eating and drinking  Eat a diet that includes fresh fruits and vegetables, whole grains, lean protein, and low-fat dairy.  Take vitamin and mineral supplements as recommended by your health care provider.  Do not drink alcohol if: ? Your health care provider tells you not to drink. ? You are pregnant, may be pregnant, or are planning to become pregnant.  If you drink alcohol: ? Limit how much you have to 0-1 drink a day. ? Be aware of how much alcohol is in your drink. In the [place] one  drink equals one 12 oz bottle of beer (355 mL), one 5 oz glass of wine (148 mL), or one 1 oz glass of hard liquor (44 mL). Lifestyle  Take daily care of your teeth and gums.  Stay active. Exercise for at least 30 minutes on 5 or more days each week.  Do not use any products that contain nicotine or tobacco, such as cigarettes, e-cigarettes, and chewing tobacco. If you need help quitting, ask your health care provider.  If you are sexually active, practice safe sex. Use a condom or other form of birth control (contraception) in order to prevent pregnancy and STIs (sexually transmitted infections).  If told by your health care provider, take low-dose aspirin daily starting at age 45. What's next?  Visit your health care provider once a year for a well check visit.  Ask your health care provider how often you should have your eyes and teeth checked.  Stay up to date on all vaccines. This information is not intended  to replace advice given to you by your health care provider. Make sure you discuss any questions you have with your health care provider. Document Released: 03/20/2015 Document Revised: 11/02/2017 Document Reviewed: 11/02/2017 Elsevier Patient Education  2020 [employer].

## 2018-10-16 NOTE — Progress Notes (Signed)
Subjective:    Patient ID: Deborah Haney, female    DOB: June 01, 1965, 53 y.o.   MRN: [id number]  HPI Chief Complaint  Patient presents with  . Annual Exam    wants std screening    She is here for a complete physical exam and to follow up on chronic health conditions.  Other providers: [facility] Dr. Ardis Hughs - GI  Dr. Nyoka Cowden - dentist  Dr. Bettina Gavia- cardiologist   Checks BS every morning. Readings have been close to goal range recently.  They were elevated while in hospital and getting steroid tx for Covid.   Would like to get antibody testing for Covid-19   Bipolar disorder- looking for a therapist  CBD oil for anxiety.  States she is in an unhealthy relationship and plans to end it.   Persistent protein in her urine. Needs follow up.   Social history: Lives alone, works as social work Tourist information centre manager for [employer]  Denies smoking, drug use. Alcohol is social.  Diet: unhealthy  Excerise: none   Immunizations: Tdap UTD. Needs pneumonia vaccine due to DM. Will hold off in case she enrolls in Covid-19 vaccine trial.   Health maintenance:  Mammogram: 03/2017 at [facility] but had a mobile mammogram in 2020 per patient. I do not have this result. States it was normal. Will schedule at [facility] for 03/2019  Colonoscopy: scheduled for November 05, 2018  Last Gynecological Exam: years ago  Last Menstrual cycle: hysterectomy in 2014 for benign issues  Last Dental Exam: 2018. Overdue  Last Eye Exam: last Friday. Getting new glasses   Wears seatbelt always, smoke detectors in home and functioning, does not text while driving and feels safe in home environment.   Reviewed allergies, medications, past medical, surgical, family, and social history.   Review of Systems Review of Systems Constitutional: -fever, -chills, -sweats, -unexpected weight change,-fatigue ENT: -runny nose, -ear pain, -sore throat Cardiology:  -chest pain, -palpitations, -edema  Respiratory: -cough, -shortness of breath, -wheezing Gastroenterology: -abdominal pain, -nausea, -vomiting, -diarrhea, -constipation  Hematology: -bleeding or bruising problems Musculoskeletal: -arthralgias, -myalgias, -joint swelling, -back pain Ophthalmology: -vision changes Urology: -dysuria, -difficulty urinating, -hematuria, -urinary frequency, -urgency Neurology: -headache, -weakness, -tingling, -numbness       Objective:   Physical Exam BP 136/82   Pulse 87   Temp 98 F (36.7 C) (Oral)   Ht 5\' 6"  (1.676 m)   Wt 190 lb 9.6 oz (86.5 kg)   LMP 04/25/2011   SpO2 98%   BMI 30.76 kg/m   General Appearance:    Alert, cooperative, no distress, appears stated age  Head:    Normocephalic, without obvious abnormality, atraumatic  Eyes:    PERRL, conjunctiva/corneas clear, EOM's intact, fundi    benign  Ears:    Normal TM's and external ear canals  Nose:    Mask in place   Throat:   Mask in place   Neck:   Supple, no lymphadenopathy;  thyroid:  no   enlargement/tenderness/nodules; no carotid   bruit or JVD  Back:    Spine nontender, no curvature, ROM normal, no CVA     tenderness  Lungs:     Clear to auscultation bilaterally without wheezes, rales or     ronchi; respirations unlabored  Chest Wall:    No tenderness or deformity   Heart:    Regular rate and rhythm, S1 and S2 normal, no murmur, rub   or gallop  Breast Exam:    Declines  Abdomen:     Soft, non-tender, nondistended, normoactive bowel sounds,    no masses, no hepatosplenomegaly  Genitalia:    Normal external genitalia without lesions.  BUS and vagina normal. No significant vaginal discharge or erythema.      Extremities:   No clubbing, cyanosis or edema  Pulses:   2+ and symmetric all extremities  Skin:   Skin color, texture, turgor normal, no rashes or lesions  Lymph nodes:   Cervical, supraclavicular, and axillary nodes normal  Neurologic:   CNII-XII intact, normal strength, sensation and gait; reflexes 2+ and  symmetric throughout          Psych:   Normal mood, affect, hygiene and grooming.          Assessment & Plan:  Routine general medical examination at a health care facility - Plan: CBC with Differential/Platelet, Comprehensive metabolic panel, POCT Urinalysis DIP (Proadvantage Device), here today for fasting CPE.  She appears to be doing well overall.  We discussed preventive health, immunizations and safety.  She will call and schedule her mammogram when she is due in January.  Has an upcoming GI appointment for colonoscopy.  Pap smears not indicated due to hysterectomy.  She is considering joining a COVID vaccine trial therefore we will hold off on pneumonia vaccine today which could interfere with her ability to participate.  She decides she does not want to participate in the vaccine trial she will let me know and we will update pneumonia vaccine.  She will receive a call from [place] [facility] [place] [facility] - Plan: Her blood pressure is very close to goal range.  Encouraged her to keep an eye on it at home.  Continue on current medication.  Recommend low-sodium diet and increasing physical activity.  Controlled type 2 diabetes mellitus without complication, without long-term current use of insulin (HCC) - Plan: TSH, T4, free, Hemoglobin A1c, Lipid panel, she is keep an eye on her blood sugar which has improved significantly since stopping COVID therapy which included steroids.  Counseling on healthy diet and exercise to help control blood sugars.  Prescribe medication if appropriate.  History of 2019 novel coronavirus disease (COVID-19) - Plan: SARS-CoV-2 Antibodies. She appears to have fully recovered. Interested in donating plasma if she has antibodies.   Elevated LDL cholesterol level - Plan: Lipid panel, she has not been on a statin previously.  Discussed the need for statin therapy in order to reduce risk of heart disease especially with diagnosis. Will send in statin once I  have her lipid panel.   At risk for sexually transmitted disease due to unprotected sex - Plan: RPR, GC/Chlamydia Probe Amp, HIV Antibody (routine testing w rflx), Hepatitis C antibody, and a relationship which she does not feel is monogamous.  Check STD panel and treat as appropriate.  Encouraged her to use condoms  Candida vaginitis - Plan: fluconazole (DIFLUCAN) 150 MG tablet, treat yeast and follow-up if not back to baseline.  Bipolar affective disorder, remission status unspecified ([place]) - Plan: She reports looking for a new therapist.  Currently managing okay but is contemplating ending a relationship that she reports is unhealthy.  She will continue doing self assurance working on self improvement.  She is in good spirits today thoughts of self-harm.  Proteinuria, unspecified type - Plan: UPEP/TP, 24-Hr Urine, discussed importance of good blood sugar and blood pressure control. follow up pending results.

## 2018-10-17 ENCOUNTER — Ambulatory Visit (INDEPENDENT_AMBULATORY_CARE_PROVIDER_SITE_OTHER): Payer: BC Managed Care – PPO | Admitting: Family Medicine

## 2018-10-17 ENCOUNTER — Other Ambulatory Visit: Payer: Self-pay

## 2018-10-17 ENCOUNTER — Encounter: Payer: Self-pay | Admitting: Family Medicine

## 2018-10-17 VITALS — BP 136/82 | HR 87 | Temp 98.0°F | Ht 66.0 in | Wt 190.6 lb

## 2018-10-17 DIAGNOSIS — Z0000 Encounter for general adult medical examination without abnormal findings: Secondary | ICD-10-CM

## 2018-10-17 DIAGNOSIS — Z8619 Personal history of other infectious and parasitic diseases: Secondary | ICD-10-CM | POA: Diagnosis not present

## 2018-10-17 DIAGNOSIS — E119 Type 2 diabetes mellitus without complications: Secondary | ICD-10-CM

## 2018-10-17 DIAGNOSIS — Z8616 Personal history of COVID-19: Secondary | ICD-10-CM

## 2018-10-17 DIAGNOSIS — B373 Candidiasis of vulva and vagina: Secondary | ICD-10-CM

## 2018-10-17 DIAGNOSIS — Z9189 Other specified personal risk factors, not elsewhere classified: Secondary | ICD-10-CM | POA: Diagnosis not present

## 2018-10-17 DIAGNOSIS — I10 Essential (primary) hypertension: Secondary | ICD-10-CM

## 2018-10-17 DIAGNOSIS — F319 Bipolar disorder, unspecified: Secondary | ICD-10-CM

## 2018-10-17 DIAGNOSIS — E7800 Pure hypercholesterolemia, unspecified: Secondary | ICD-10-CM | POA: Diagnosis not present

## 2018-10-17 DIAGNOSIS — B3731 Acute candidiasis of vulva and vagina: Secondary | ICD-10-CM

## 2018-10-17 DIAGNOSIS — R809 Proteinuria, unspecified: Secondary | ICD-10-CM | POA: Insufficient documentation

## 2018-10-17 LAB — POCT WET PREP (WET MOUNT): Trichomonas Wet Prep HPF POC: ABSENT

## 2018-10-17 LAB — POCT URINALYSIS DIP (PROADVANTAGE DEVICE)
Bilirubin, UA: NEGATIVE
Blood, UA: NEGATIVE
Glucose, UA: NEGATIVE mg/dL
Ketones, POC UA: NEGATIVE mg/dL
Leukocytes, UA: NEGATIVE
Nitrite, UA: NEGATIVE
Protein Ur, POC: 30 mg/dL — AB
Specific Gravity, Urine: 1.025
Urobilinogen, Ur: NEGATIVE
pH, UA: 6 (ref 5.0–8.0)

## 2018-10-17 MED ORDER — FLUCONAZOLE 150 MG PO TABS: 150.0000 mg | ORAL_TABLET | Freq: Once | ORAL | 0 refills | Status: AC

## 2018-10-18 ENCOUNTER — Encounter: Payer: Self-pay | Admitting: Family Medicine

## 2018-10-18 LAB — CBC WITH DIFFERENTIAL/PLATELET
Basophils Absolute: 0 10*3/uL (ref 0.0–0.2)
Basos: 1 %
EOS (ABSOLUTE): 0.1 10*3/uL (ref 0.0–0.4)
Eos: 2 %
Hematocrit: 35.1 % (ref 34.0–46.6)
Hemoglobin: 11.7 g/dL (ref 11.1–15.9)
Immature Grans (Abs): 0 10*3/uL (ref 0.0–0.1)
Immature Granulocytes: 0 %
Lymphocytes Absolute: 2.2 10*3/uL (ref 0.7–3.1)
Lymphs: 45 %
MCH: 29.2 pg (ref 26.6–33.0)
MCHC: 33.3 g/dL (ref 31.5–35.7)
MCV: 88 fL (ref 79–97)
Monocytes Absolute: 0.3 10*3/uL (ref 0.1–0.9)
Monocytes: 6 %
Neutrophils Absolute: 2.3 10*3/uL (ref 1.4–7.0)
Neutrophils: 46 %
Platelets: 203 10*3/uL (ref 150–450)
RBC: 4.01 x10E6/uL (ref 3.77–5.28)
RDW: 15.3 % (ref 11.7–15.4)
WBC: 4.9 10*3/uL (ref 3.4–10.8)

## 2018-10-18 LAB — COMPREHENSIVE METABOLIC PANEL
ALT: 14 IU/L (ref 0–32)
AST: 15 IU/L (ref 0–40)
Albumin/Globulin Ratio: 1.2 (ref 1.2–2.2)
Albumin: 4 g/dL (ref 3.8–4.8)
Alkaline Phosphatase: 47 IU/L (ref 39–117)
BUN/Creatinine Ratio: 10 (ref 9–23)
BUN: 7 mg/dL (ref 6–24)
Bilirubin Total: 0.3 mg/dL (ref 0.0–1.2)
CO2: 21 mmol/L (ref 20–29)
Calcium: 9.4 mg/dL (ref 8.7–10.2)
Chloride: 105 mmol/L (ref 96–106)
Creatinine, Ser: 0.73 mg/dL (ref 0.57–1.00)
GFR calc Af Amer: 111 mL/min/{1.73_m2} (ref >59)
GFR calc non Af Amer: 96 mL/min/{1.73_m2} (ref >59)
Globulin, Total: 3.3 g/dL (ref 1.5–4.5)
Glucose: 160 mg/dL — ABNORMAL HIGH (ref 65–99)
Potassium: 4.2 mmol/L (ref 3.5–5.2)
Sodium: 142 mmol/L (ref 134–144)
Total Protein: 7.3 g/dL (ref 6.0–8.5)

## 2018-10-18 LAB — HIV ANTIBODY (ROUTINE TESTING W REFLEX): HIV Screen 4th Generation wRfx: NONREACTIVE

## 2018-10-18 LAB — LIPID PANEL
Chol/HDL Ratio: 6 ratio — ABNORMAL HIGH (ref 0.0–4.4)
Cholesterol, Total: 198 mg/dL (ref 100–199)
HDL: 33 mg/dL — ABNORMAL LOW (ref >39)
LDL Calculated: 102 mg/dL — ABNORMAL HIGH (ref 0–99)
Triglycerides: 314 mg/dL — ABNORMAL HIGH (ref 0–149)
VLDL Cholesterol Cal: 63 mg/dL — ABNORMAL HIGH (ref 5–40)

## 2018-10-18 LAB — RPR: RPR Ser Ql: NONREACTIVE

## 2018-10-18 LAB — T4, FREE: Free T4: 1.06 ng/dL (ref 0.82–1.77)

## 2018-10-18 LAB — HEMOGLOBIN A1C
Est. average glucose Bld gHb Est-mCnc: 137 mg/dL
Hgb A1c MFr Bld: 6.4 % — ABNORMAL HIGH (ref 4.8–5.6)

## 2018-10-18 LAB — HEPATITIS C ANTIBODY: Hep C Virus Ab: 0.1 s/co ratio (ref 0.0–0.9)

## 2018-10-18 LAB — TSH: TSH: 0.841 u[IU]/mL (ref 0.450–4.500)

## 2018-10-18 LAB — SARS-COV-2 ANTIBODIES: SARS-CoV-2 Antibodies: POSITIVE — AB

## 2018-10-19 ENCOUNTER — Other Ambulatory Visit: Payer: Self-pay | Admitting: Family Medicine

## 2018-10-19 DIAGNOSIS — E782 Mixed hyperlipidemia: Secondary | ICD-10-CM

## 2018-10-19 MED ORDER — ATORVASTATIN CALCIUM 10 MG PO TABS: 10.0000 mg | ORAL_TABLET | Freq: Every day | ORAL | 1 refills | Status: DC

## 2018-10-22 DIAGNOSIS — R809 Proteinuria, unspecified: Secondary | ICD-10-CM | POA: Diagnosis not present

## 2018-10-22 LAB — GC/CHLAMYDIA PROBE AMP
Chlamydia trachomatis, NAA: NEGATIVE
Neisseria Gonorrhoeae by PCR: NEGATIVE

## 2018-10-23 LAB — UPEP/TP, 24-HR URINE
Albumin, U: 0 %
Alpha 1, Urine: 0 %
Alpha 2, Urine: 0 %
Beta, Urine: 0 %
Gamma Globulin, Urine: 0 %
Protein, 24H Urine: 63 mg/24 hr (ref 30–150)
Protein, Ur: 4.3 mg/dL

## 2018-11-01 ENCOUNTER — Encounter: Payer: Self-pay | Admitting: Gastroenterology

## 2018-11-02 ENCOUNTER — Telehealth: Payer: Self-pay | Admitting: Gastroenterology

## 2018-11-02 ENCOUNTER — Telehealth: Payer: Self-pay | Admitting: Family Medicine

## 2018-11-02 NOTE — Telephone Encounter (Signed)
Covid-19 screening questions     Do you now or have you had a fever in the last 14 days?   Do you have any respiratory symptoms of shortness of breath or cough now or in the last 14 days?   Do you have any family members or close contacts with diagnosed or suspected Covid-19 in the past 14 days?   Have you been tested for Covid-19 and found to be positive?  Please advise pt to check in on the 4th floor and that care partner may wait in the car or come up to the lobby during procedure. Also make aware that both must enter the building wearing a mask.

## 2018-11-02 NOTE — Telephone Encounter (Signed)
Pt reported that she was diagnosed with COVID-19 first week of June.

## 2018-11-02 NOTE — Telephone Encounter (Signed)
Requested records from [facility] received.

## 2018-11-05 ENCOUNTER — Other Ambulatory Visit: Payer: Self-pay

## 2018-11-05 ENCOUNTER — Ambulatory Visit (AMBULATORY_SURGERY_CENTER): Payer: BC Managed Care – PPO | Admitting: Gastroenterology

## 2018-11-05 ENCOUNTER — Encounter: Payer: Self-pay | Admitting: Gastroenterology

## 2018-11-05 VITALS — BP 116/72 | HR 69 | Temp 97.7°F | Resp 14 | Ht 66.0 in | Wt 190.0 lb

## 2018-11-05 DIAGNOSIS — D122 Benign neoplasm of ascending colon: Secondary | ICD-10-CM | POA: Diagnosis not present

## 2018-11-05 DIAGNOSIS — K625 Hemorrhage of anus and rectum: Secondary | ICD-10-CM

## 2018-11-05 DIAGNOSIS — D128 Benign neoplasm of rectum: Secondary | ICD-10-CM

## 2018-11-05 DIAGNOSIS — K621 Rectal polyp: Secondary | ICD-10-CM | POA: Diagnosis not present

## 2018-11-05 DIAGNOSIS — K649 Unspecified hemorrhoids: Secondary | ICD-10-CM

## 2018-11-05 DIAGNOSIS — Z1211 Encounter for screening for malignant neoplasm of colon: Secondary | ICD-10-CM | POA: Diagnosis not present

## 2018-11-05 MED ORDER — SODIUM CHLORIDE 0.9 % IV SOLN: 500.0000 mL | Freq: Once | INTRAVENOUS | Status: DC

## 2018-11-05 NOTE — Progress Notes (Signed)
PT taken to PACU. Monitors in place. VSS. Report given to RN. 

## 2018-11-05 NOTE — Patient Instructions (Signed)
Read all of the handouts given to you by your recovery room nurse.  Call Dr. Hilarie Fredrickson regarding the hemorrhoid banding.  You do have a handout regarding the banding.  Thank-you for choosing [place] for your healthcare needs today.  YOU HAD AN ENDOSCOPIC PROCEDURE TODAY AT [facility]:   Refer to the procedure report that was given to you for any specific questions about what was found during the examination.  If the procedure report does not answer your questions, please call your gastroenterologist to clarify.  If you requested that your care partner not be given the details of your procedure findings, then the procedure report has been included in a sealed envelope for you to review at your convenience later.  YOU SHOULD EXPECT: Some feelings of bloating in the abdomen. Passage of more gas than usual.  Walking can help get rid of the air that was put into your GI tract during the procedure and reduce the bloating. If you had a lower endoscopy (such as a colonoscopy or flexible sigmoidoscopy) you may notice spotting of blood in your stool or on the toilet paper. If you underwent a bowel prep for your procedure, you may not have a normal bowel movement for a few days.  Please Note:  You might notice some irritation and congestion in your nose or some drainage.  This is from the oxygen used during your procedure.  There is no need for concern and it should clear up in a day or so.  SYMPTOMS TO REPORT IMMEDIATELY:   Following lower endoscopy (colonoscopy or flexible sigmoidoscopy):  Excessive amounts of blood in the stool  Significant tenderness or worsening of abdominal pains  Swelling of the abdomen that is new, acute  Fever of 100F or higher   For urgent or emergent issues, a gastroenterologist can be reached at any hour by calling [phone].   DIET:  We do recommend a small meal at first, but then you may proceed to your regular diet.  Drink plenty of fluids but you should  avoid alcoholic beverages for 24 hours.  ACTIVITY:  You should plan to take it easy for the rest of today and you should NOT DRIVE or use heavy machinery until tomorrow (because of the sedation medicines used during the test).    FOLLOW UP: Our staff will call the number listed on your records 48-72 hours following your procedure to check on you and address any questions or concerns that you may have regarding the information given to you following your procedure. If we do not reach you, we will leave a message.  We will attempt to reach you two times.  During this call, we will ask if you have developed any symptoms of COVID 19. If you develop any symptoms (ie: fever, flu-like symptoms, shortness of breath, cough etc.) before then, please call [phone].  If you test positive for Covid 19 in the 2 weeks post procedure, please call and report this information to [place].    If any biopsies were taken you will be contacted by phone or by letter within the next 1-3 weeks.  Please call us at [phone] if you have not heard about the biopsies in 3 weeks.    SIGNATURES/CONFIDENTIALITY: You and/or your care partner have signed paperwork which will be entered into your electronic medical record.  These signatures attest to the fact that that the information above on your After Visit Summary has been reviewed and is understood.  Full  responsibility of the confidentiality of this discharge information lies with you and/or your care-partner.

## 2018-11-05 NOTE — Op Note (Addendum)
[place] Patient Name: Deborah Haney Procedure Date: 11/05/2018 8:58 AM MRN: [id number] Endoscopist: Milus Banister , MD Age: 53 Referring MD:  Date of Birth: Dec 04, 1965 Gender: Female Account #: [id number] Procedure:                Colonoscopy Indications:              Screening for colorectal malignant neoplasm Medicines:                Monitored Anesthesia Care Procedure:                Pre-Anesthesia Assessment:                           - Prior to the procedure, a History and Physical                            was performed, and patient medications and                            allergies were reviewed. The patient's tolerance of                            previous anesthesia was also reviewed. The risks                            and benefits of the procedure and the sedation                            options and risks were discussed with the patient.                            All questions were answered, and informed consent                            was obtained. Prior Anticoagulants: The patient has                            taken no previous anticoagulant or antiplatelet                            agents. ASA Grade Assessment: II - A patient with                            mild systemic disease. After reviewing the risks                            and benefits, the patient was deemed in                            satisfactory condition to undergo the procedure.                           After obtaining informed consent, the colonoscope  was passed under direct vision. Throughout the                            procedure, the patient's blood pressure, pulse, and                            oxygen saturations were monitored continuously. The                            Colonoscope was introduced through the anus and                            advanced to the the cecum, identified by                            appendiceal orifice and  ileocecal valve. The                            colonoscopy was performed without difficulty. The                            patient tolerated the procedure well. The quality                            of the bowel preparation was good. The ileocecal                            valve, appendiceal orifice, and rectum were                            photographed. Scope In: 9:00:58 AM Scope Out: 9:15:12 AM Scope Withdrawal Time: 0 hours 12 minutes 7 seconds  Total Procedure Duration: 0 hours 14 minutes 14 seconds  Findings:                 Two sessile polyps were found in the rectum and                            ascending colon. The polyps were 2 to 4 mm in size.                            These polyps were removed with a cold snare.                            Resection and retrieval were complete.                           External and internal hemorrhoids were found. The                            hemorrhoids were medium-sized.                           The exam was otherwise without abnormality on  direct and retroflexion views. Complications:            No immediate complications. Estimated blood loss:                            None. Estimated Blood Loss:     Estimated blood loss: none. Impression:               - Two 2 to 4 mm polyps in the rectum and in the                            ascending colon, removed with a cold snare.                            Resected and retrieved.                           - External and internal hemorrhoids.                           - The examination was otherwise normal on direct                            and retroflexion views. Recommendation:           - Patient has a contact number available for                            emergencies. The signs and symptoms of potential                            delayed complications were discussed with the                            patient. Return to normal activities tomorrow.                             Written discharge instructions were provided to the                            patient.                           - Resume previous diet.                           - Continue present medications.                           - My office will arrange for you to meet with Dr.                            Hilarie Fredrickson again to resume hemorrhoid banding.                           You will receive a letter within 2-3 weeks with the  pathology results and my final recommendations.                           If the polyp(s) is proven to be 'pre-cancerous' on                            pathology, you will need repeat colonoscopy in 7                            years. If the polyp(s) is NOT 'precancerous' on                            pathology then you should repeat colon cancer                            screening in 10 years with colonoscopy without need                            for colon cancer screening by any method prior to                            then (including stool testing). Milus Banister, MD 11/05/2018 9:18:15 AM This report has been signed electronically.

## 2018-11-05 NOTE — Progress Notes (Signed)
Pt's states no medical or surgical changes since previsit or office visit.  Covid- [place]

## 2018-11-07 ENCOUNTER — Telehealth: Payer: Self-pay

## 2018-11-07 NOTE — Telephone Encounter (Signed)
  Follow up Call-  Call back number 11/05/2018  Post procedure Call Back phone  # [phone]  Permission to leave phone message Yes  Some recent data might be hidden     Patient questions:  Do you have a fever, pain , or abdominal swelling? No. Pain Score  0 *  Have you tolerated food without any problems? Yes.    Have you been able to return to your normal activities? Yes.    Do you have any questions about your discharge instructions: Diet   No. Medications  No. Follow up visit  No.  Do you have questions or concerns about your Care? No.  Actions: * If pain score is 4 or above: No action needed, pain <4.  Have you developed a fever since your procedure? No 2.   Have you had an respiratory symptoms (SOB or cough) since your procedure? No  3.   Have you tested positive for COVID 19 since your procedure No  4.   Have you had any family members/close contacts diagnosed with the COVID 19 since your procedure?  No   If yes to any of these questions please route to Joylene John, RN and Alphonsa Gin, RN.

## 2018-11-08 ENCOUNTER — Encounter: Payer: Self-pay | Admitting: Family Medicine

## 2018-11-09 ENCOUNTER — Encounter: Payer: Self-pay | Admitting: Gastroenterology

## 2018-11-13 ENCOUNTER — Encounter: Payer: Self-pay | Admitting: Family Medicine

## 2018-11-14 ENCOUNTER — Ambulatory Visit (INDEPENDENT_AMBULATORY_CARE_PROVIDER_SITE_OTHER): Payer: BC Managed Care – PPO | Admitting: Family Medicine

## 2018-11-14 ENCOUNTER — Encounter: Payer: Self-pay | Admitting: Family Medicine

## 2018-11-14 ENCOUNTER — Other Ambulatory Visit: Payer: Self-pay

## 2018-11-14 VITALS — BP 110/70 | HR 70 | Temp 98.0°F | Wt 190.4 lb

## 2018-11-14 DIAGNOSIS — E785 Hyperlipidemia, unspecified: Secondary | ICD-10-CM

## 2018-11-14 DIAGNOSIS — N898 Other specified noninflammatory disorders of vagina: Secondary | ICD-10-CM | POA: Diagnosis not present

## 2018-11-14 DIAGNOSIS — Z23 Encounter for immunization: Secondary | ICD-10-CM | POA: Diagnosis not present

## 2018-11-14 DIAGNOSIS — Z79899 Other long term (current) drug therapy: Secondary | ICD-10-CM

## 2018-11-14 DIAGNOSIS — I10 Essential (primary) hypertension: Secondary | ICD-10-CM

## 2018-11-14 DIAGNOSIS — R202 Paresthesia of skin: Secondary | ICD-10-CM

## 2018-11-14 MED ORDER — FLUCONAZOLE 150 MG PO TABS: ORAL_TABLET | ORAL | 0 refills | Status: DC

## 2018-11-14 NOTE — Progress Notes (Signed)
Subjective:    Patient ID: Deborah Haney, female    DOB: [birth year], 53 y.o.   MRN: [id number]  HPI Chief Complaint  Patient presents with  . 4 week follow-up    4 week follow-up   Here for a 4 week follow up on elevated LDL and starting on a statin. Started atorvastatin on 10/28/18. Reports taking it daily without any side effects.   Vaginal itching and states she is dealing with yeast.  Reports this feels like her usual yeast infection.  Denies recent rectal activity.  Denies fever, chills, headache, dizziness, chest pain, palpitations, shortness of breath, abdominal pain, nausea, vomiting, diarrhea, urinary symptoms.  Complains of a 3 year history of intermittent bilateral leg pain that she describes "pins and needles" and sensitive to the touch. Not in her feet. Occurs once per month and lasts for 5 days.  She puts Shea butter on her legs.  Denies any numbness or weakness.  No urinary symptoms.  She denies back pain or history of back injury.  She would like to get a pneumonia vaccine. She has diabetes. States blood sugars are controlled. FBS yesterday was 121  States she had an eye exam but they did not do a diabetic exam.   Recently had a colonoscopy. Had one polyp. Due for recall in 5 years.   Reviewed allergies, medications, past medical, surgical, family, and social history.    Review of Systems Pertinent positives and negatives in the history of present illness.     Objective:   Physical Exam Constitutional:      Appearance: Normal appearance.  Neck:     Musculoskeletal: Normal range of motion and neck supple.  Cardiovascular:     Pulses: Normal pulses.  Pulmonary:     Effort: Pulmonary effort is normal.  Genitourinary:    Comments: Declines. Self swab performed  Musculoskeletal: Normal range of motion.  Skin:    General: Skin is warm and dry.     Capillary Refill: Capillary refill takes less than 2 seconds.  Neurological:     General: No focal deficit  present.     Mental Status: She is alert and oriented to person, place, and time.     Cranial Nerves: No cranial nerve deficit.     Sensory: No sensory deficit.     Motor: No weakness.     Coordination: Coordination normal.     Gait: Gait normal.     Deep Tendon Reflexes: Reflexes normal.  Psychiatric:        Mood and Affect: Mood normal.        Thought Content: Thought content normal.        Judgment: Judgment normal.    BP 110/70   Pulse 70   Temp 98 F (36.7 C)   Wt 190 lb 6.4 oz (86.4 kg)   LMP 04/25/2011   BMI 30.73 kg/m       Assessment & Plan:  Dyslipidemia - Plan: Comprehensive metabolic panel, Lipid panel -She is only been on the statin for 2 weeks.  Doing well and no side effects.  She will return in 4 weeks for fasting lipids.  Essential hypertension-blood pressure well controlled.  Continue on current medication.  Need for prophylactic vaccination against Streptococcus pneumoniae (pneumococcus) - Plan: Pneumococcal conjugate vaccine 13-valent.  Vaccine given due to diabetes.  History of pneumonia as well.  Discussed potential side effects.  Vaginal itching - Plan: fluconazole (DIFLUCAN) 150 MG tablet, NuSwab Vaginitis Plus (VG+)-history of yeast infection.  Will treat empirically and follow-up pending swab results  Paresthesia of both lower extremities - Plan: Vitamin B12-she would like to wait and get her vitamin B12 level checked when she comes back for lipid panel in 4 weeks.  This is an intermittent ongoing issue for the past 3 years that she has not brought up until today.  Not that bothersome.  Neurological exam is normal.  Occurring approximately once monthly.  She will keep a journal and try to get me more information regarding her symptoms if it recurs again  Vaginal discharge - Plan: NuSwab Vaginitis Plus (VG+)-treat for yeast with Diflucan.  She is certain that she has a yeast infection.  Follow-up pending swab results Encouraged a probiotic.    Medication management - Plan: Comprehensive metabolic panel, Lipid panel-check labs in 4 weeks when she returns for a fasting lab visit.  Follow-up pending results

## 2018-11-14 NOTE — Patient Instructions (Signed)
Make sure you are taking vitamin B12 in your multivitamin.   Keep a journal of your symptoms. What where you doing prior to the next episode of leg sensations, where do you feel it, any other symptoms associated, does anything make it worse or better?  Take a probiotic or eat yogurt with live cultures daily. Take the Diflucan and if you are still having symptoms 3 days later, take the 2nd tablet.

## 2018-11-16 LAB — NUSWAB VAGINITIS PLUS (VG+)
Candida albicans, NAA: POSITIVE — AB
Candida glabrata, NAA: NEGATIVE
Chlamydia trachomatis, NAA: NEGATIVE
Neisseria gonorrhoeae, NAA: NEGATIVE
Trich vag by NAA: NEGATIVE

## 2018-11-19 ENCOUNTER — Encounter: Payer: Self-pay | Admitting: Family Medicine

## 2018-11-20 ENCOUNTER — Encounter: Payer: Self-pay | Admitting: Family Medicine

## 2018-11-21 ENCOUNTER — Ambulatory Visit: Payer: BC Managed Care – PPO | Admitting: Family Medicine

## 2018-11-21 ENCOUNTER — Encounter: Payer: Self-pay | Admitting: Family Medicine

## 2018-11-21 ENCOUNTER — Other Ambulatory Visit: Payer: Self-pay

## 2018-11-21 VITALS — Wt 189.0 lb

## 2018-11-21 DIAGNOSIS — E119 Type 2 diabetes mellitus without complications: Secondary | ICD-10-CM

## 2018-11-21 DIAGNOSIS — I10 Essential (primary) hypertension: Secondary | ICD-10-CM

## 2018-11-21 DIAGNOSIS — F319 Bipolar disorder, unspecified: Secondary | ICD-10-CM

## 2018-11-21 DIAGNOSIS — E785 Hyperlipidemia, unspecified: Secondary | ICD-10-CM

## 2018-11-21 NOTE — Progress Notes (Signed)
   Subjective:    Patient ID: Deborah Haney, female    DOB: 1965/10/23, 53 y.o.   MRN: [id number]  HPI Chief Complaint  Patient presents with  . discuss paperwork    discuss paperwork   She has antibodies to COVID-19 and was recently hospitalized for this infection.  She has fully recovered. She is interested in donating plasma. This is a phone call to discussed her current medical conditions and medications that might interfere with plasma donation.  There is a lengthy form to fill out regarding chronic health conditions, recent lab results, treatment plan for each medical diagnoses.   Review of Systems Pertinent positives and negatives in the history of present illness.     Objective:   Physical Exam Wt 189 lb (85.7 kg)   LMP 04/25/2011   BMI 30.51 kg/m         Assessment & Plan:  Controlled type 2 diabetes mellitus without complication, without long-term current use of insulin (HCC)  Dyslipidemia  Essential hypertension  Bipolar affective disorder, remission status unspecified ([place])  Reviewed chronic medical conditions, history and treatment with patient in order to fill out her forms for plasma donation.  She will not be charged for this visit.

## 2018-11-30 ENCOUNTER — Encounter: Payer: Self-pay | Admitting: Family Medicine

## 2018-12-12 ENCOUNTER — Other Ambulatory Visit: Payer: Self-pay

## 2018-12-12 ENCOUNTER — Other Ambulatory Visit (INDEPENDENT_AMBULATORY_CARE_PROVIDER_SITE_OTHER): Payer: BC Managed Care – PPO

## 2018-12-12 DIAGNOSIS — Z79899 Other long term (current) drug therapy: Secondary | ICD-10-CM

## 2018-12-12 DIAGNOSIS — R202 Paresthesia of skin: Secondary | ICD-10-CM

## 2018-12-12 DIAGNOSIS — Z23 Encounter for immunization: Secondary | ICD-10-CM

## 2018-12-12 DIAGNOSIS — E785 Hyperlipidemia, unspecified: Secondary | ICD-10-CM

## 2018-12-13 LAB — COMPREHENSIVE METABOLIC PANEL
ALT: 13 IU/L (ref 0–32)
AST: 11 IU/L (ref 0–40)
Albumin/Globulin Ratio: 1.4 (ref 1.2–2.2)
Albumin: 4.3 g/dL (ref 3.8–4.9)
Alkaline Phosphatase: 53 IU/L (ref 39–117)
BUN/Creatinine Ratio: 14 (ref 9–23)
BUN: 13 mg/dL (ref 6–24)
Bilirubin Total: 0.4 mg/dL (ref 0.0–1.2)
CO2: 25 mmol/L (ref 20–29)
Calcium: 9.5 mg/dL (ref 8.7–10.2)
Chloride: 103 mmol/L (ref 96–106)
Creatinine, Ser: 0.96 mg/dL (ref 0.57–1.00)
GFR calc Af Amer: 79 mL/min/{1.73_m2} (ref >59)
GFR calc non Af Amer: 69 mL/min/{1.73_m2} (ref >59)
Globulin, Total: 3.1 g/dL (ref 1.5–4.5)
Glucose: 123 mg/dL — ABNORMAL HIGH (ref 65–99)
Potassium: 4.4 mmol/L (ref 3.5–5.2)
Sodium: 142 mmol/L (ref 134–144)
Total Protein: 7.4 g/dL (ref 6.0–8.5)

## 2018-12-13 LAB — LIPID PANEL
Chol/HDL Ratio: 2.9 ratio (ref 0.0–4.4)
Cholesterol, Total: 130 mg/dL (ref 100–199)
HDL: 45 mg/dL (ref >39)
LDL Chol Calc (NIH): 67 mg/dL (ref 0–99)
Triglycerides: 97 mg/dL (ref 0–149)
VLDL Cholesterol Cal: 18 mg/dL (ref 5–40)

## 2018-12-13 LAB — VITAMIN B12: Vitamin B-12: 499 pg/mL (ref 232–1245)

## 2018-12-25 ENCOUNTER — Encounter: Payer: Self-pay | Admitting: Family Medicine

## 2018-12-25 ENCOUNTER — Other Ambulatory Visit: Payer: Self-pay | Admitting: Family Medicine

## 2018-12-25 DIAGNOSIS — E119 Type 2 diabetes mellitus without complications: Secondary | ICD-10-CM

## 2018-12-25 DIAGNOSIS — I10 Essential (primary) hypertension: Secondary | ICD-10-CM

## 2019-01-09 ENCOUNTER — Encounter: Payer: Self-pay | Admitting: Family Medicine

## 2019-01-10 ENCOUNTER — Other Ambulatory Visit: Payer: Self-pay

## 2019-01-10 ENCOUNTER — Encounter: Payer: Self-pay | Admitting: Family Medicine

## 2019-01-10 ENCOUNTER — Ambulatory Visit (INDEPENDENT_AMBULATORY_CARE_PROVIDER_SITE_OTHER): Payer: BC Managed Care – PPO | Admitting: Family Medicine

## 2019-01-10 VITALS — Wt 190.0 lb

## 2019-01-10 DIAGNOSIS — J392 Other diseases of pharynx: Secondary | ICD-10-CM | POA: Diagnosis not present

## 2019-01-10 DIAGNOSIS — Z20822 Contact with and (suspected) exposure to covid-19: Secondary | ICD-10-CM

## 2019-01-10 DIAGNOSIS — Z20828 Contact with and (suspected) exposure to other viral communicable diseases: Secondary | ICD-10-CM

## 2019-01-10 NOTE — Progress Notes (Signed)
   Subjective:  Documentation for virtual audio and video telecommunications through [place] encounter:  The patient was located in her car. 2 patient identifiers used.  The provider was located in the office. The patient did consent to this visit and is aware of possible charges through their insurance for this visit.  The other persons participating in this telemedicine service were none.    Patient ID: Deborah Haney, female    DOB: [birth year], 53 y.o.   MRN: [id number]  HPI Chief Complaint  Patient presents with  . 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 they were coughing and went adn got tested sunday adn a couple of them are positive and still waiting on the others. scratchy throat    Complains of a scratchy throat after being exposed to Covid-19 5 days ago. She had a close exposure to 3 people who have tested positive. 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   She was hospitalized with Covid-19 in June and tested positive for antibodies in August.  States she is anxious that she could get the virus again.  Would like to get tested to rule out Covid-19 virus.   Denies fever, chills, headache, rhinorrhea, nasal congestion, cough, shortness of breath, N/V/D. No loss of taste or smell. Normal appetite.   Reviewed allergies, medications, past medical, surgical, family, and social history.    Review of Systems Pertinent positives and negatives in the history of present illness.     Objective:   Physical Exam Wt 190 lb (86.2 kg)   LMP 04/25/2011   BMI 30.67 kg/m   Alert and in no acute distress. Normal respirations, speaking in complete sentences without difficulty. Normal mood.       Assessment & Plan:  Exposure to COVID-19 virus  Throat irritation  Discussed case with Dr. Tomi Bamberger and Dr. Redmond School. We are hopeful that she still has antibodies to Covid-19 since  she was + for antibodies in mid August. However, since she has symptoms, and it has been 5 days since her close exposure, she will go to the [facility] for testing.   Time spent on call was 10 minutes and in review of previous records 15 minutes total.  This virtual service is not related to other E/M service within previous 7 days.

## 2019-01-11 ENCOUNTER — Other Ambulatory Visit: Payer: Self-pay

## 2019-01-11 DIAGNOSIS — Z20822 Contact with and (suspected) exposure to covid-19: Secondary | ICD-10-CM

## 2019-01-12 LAB — NOVEL CORONAVIRUS, NAA: SARS-CoV-2, NAA: NOT DETECTED

## 2019-02-11 ENCOUNTER — Encounter: Payer: Self-pay | Admitting: Family Medicine

## 2019-02-11 DIAGNOSIS — B379 Candidiasis, unspecified: Secondary | ICD-10-CM

## 2019-02-11 MED ORDER — FLUCONAZOLE 150 MG PO TABS: 150.0000 mg | ORAL_TABLET | Freq: Once | ORAL | 0 refills | Status: AC

## 2019-02-25 ENCOUNTER — Other Ambulatory Visit: Payer: Self-pay

## 2019-02-25 DIAGNOSIS — Z20822 Contact with and (suspected) exposure to covid-19: Secondary | ICD-10-CM

## 2019-02-25 DIAGNOSIS — Z20828 Contact with and (suspected) exposure to other viral communicable diseases: Secondary | ICD-10-CM | POA: Diagnosis not present

## 2019-02-26 LAB — NOVEL CORONAVIRUS, NAA: SARS-CoV-2, NAA: NOT DETECTED

## 2019-03-20 ENCOUNTER — Encounter: Payer: Self-pay | Admitting: Family Medicine

## 2019-04-18 ENCOUNTER — Encounter: Payer: Self-pay | Admitting: Family Medicine

## 2019-04-18 ENCOUNTER — Ambulatory Visit (INDEPENDENT_AMBULATORY_CARE_PROVIDER_SITE_OTHER): Payer: BC Managed Care – PPO | Admitting: Family Medicine

## 2019-04-18 ENCOUNTER — Other Ambulatory Visit: Payer: Self-pay

## 2019-04-18 VITALS — BP 120/70 | HR 80 | Temp 97.8°F | Wt 196.8 lb

## 2019-04-18 DIAGNOSIS — M5441 Lumbago with sciatica, right side: Secondary | ICD-10-CM | POA: Diagnosis not present

## 2019-04-18 DIAGNOSIS — E119 Type 2 diabetes mellitus without complications: Secondary | ICD-10-CM | POA: Diagnosis not present

## 2019-04-18 DIAGNOSIS — G8929 Other chronic pain: Secondary | ICD-10-CM

## 2019-04-18 NOTE — Patient Instructions (Signed)
Try taking Tylenol 1000 mg twice daily.  Avoid anti-inflammatories such as ibuprofen due to your history of ulcer.  I am avoiding steroids due to your Covid vaccine 2 days ago.  Use ice or heat.  You may also use a topical pain medication such as Biofreeze or Salonpas  I am referring you to Ortho care and they should contact you soon to schedule a visit

## 2019-04-18 NOTE — Progress Notes (Signed)
   Subjective:    Patient ID: Deborah Haney, female    DOB: 19-Dec-1965, 54 y.o.   MRN: [id number]  HPI Chief Complaint  Patient presents with  . nerve pain    nerve pain right on side. x 2 months   She is here with complaints of right low back that radiates down the back of her right leg. NKI. History of similar pain. Went to PT in 2019 for this. Never really got better. Worsening over the past 2 months.  Pain is sharp at times, worse with walking, lifting something, turning to her side and is now keeping her awake. No numbness, tingling or weakness of her right leg.  No fever, chills, abdominal pain, N/V/D.   Taking ibuprofen 800 mg if pain wakes her up. Otherwise, no medication.   She just received her Covid-19 vaccine 2 days ago.    Would like to see what her A1c looks like. States her blood sugars have been gradually increasing. diabetes is diet controlled.  FBS has been 134 or slightly higher  Last Hgb A1c 6.4% in 10/2018  Denies polyuria or polydipsia.   Reviewed allergies, medications, past medical, surgical, family, and social history.    Review of Systems Pertinent positives and negatives in the history of present illness.     Objective:   Physical Exam BP 120/70   Pulse 80   Temp 97.8 F (36.6 C)   Wt 196 lb 12.8 oz (89.3 kg)   LMP 04/25/2011   SpO2 98%   BMI 31.76 kg/m   Alert and in no distress. Cardiac exam shows a regular  rhythm without murmurs or gallops. Lungs are clear to auscultation. Back with normal sensation and motion. Severe TTP over right sciatic notch. She has marked pain with extreme flexion, hyperextension and rotation of her torso. Negative straight leg raise. DTRs normal and symmetric and patellar, achilles and no clonus.        Assessment & Plan:  Chronic right-sided low back pain with right-sided sciatica - Plan: Ambulatory referral to Orthopedic Surgery  Controlled type 2 diabetes mellitus without complication, without long-term  current use of insulin ([place]) - Plan: CBC with Differential/Platelet, Comprehensive metabolic panel, Hemoglobin A1c  No neurological deficits noted on exam. Pain is keeping her awake which is worrisome.  She cannot tolerate NSAIDs due to history of GI bleed.  Cannot take opioids due to history of addiction.  Steroids are not advised due to Covid-19 vaccine 2 days ago.  She will try Tylenol 1,000 mg bid, ice or heat and topical analgesic.  Refer to ortho since she has failed PT in past for this issue.  Diabetes has been diet controlled. She is seeing readings higher than usual. Check A1c, CBC, and CMP and follow up.

## 2019-04-19 LAB — COMPREHENSIVE METABOLIC PANEL
ALT: 12 IU/L (ref 0–32)
AST: 15 IU/L (ref 0–40)
Albumin/Globulin Ratio: 1.3 (ref 1.2–2.2)
Albumin: 4.1 g/dL (ref 3.8–4.9)
Alkaline Phosphatase: 53 IU/L (ref 39–117)
BUN/Creatinine Ratio: 11 (ref 9–23)
BUN: 9 mg/dL (ref 6–24)
Bilirubin Total: 0.5 mg/dL (ref 0.0–1.2)
CO2: 20 mmol/L (ref 20–29)
Calcium: 9 mg/dL (ref 8.7–10.2)
Chloride: 102 mmol/L (ref 96–106)
Creatinine, Ser: 0.85 mg/dL (ref 0.57–1.00)
GFR calc Af Amer: 92 mL/min/{1.73_m2} (ref >59)
GFR calc non Af Amer: 80 mL/min/{1.73_m2} (ref >59)
Globulin, Total: 3.1 g/dL (ref 1.5–4.5)
Glucose: 213 mg/dL — ABNORMAL HIGH (ref 65–99)
Potassium: 3.9 mmol/L (ref 3.5–5.2)
Sodium: 141 mmol/L (ref 134–144)
Total Protein: 7.2 g/dL (ref 6.0–8.5)

## 2019-04-19 LAB — CBC WITH DIFFERENTIAL/PLATELET
Basophils Absolute: 0 10*3/uL (ref 0.0–0.2)
Basos: 1 %
EOS (ABSOLUTE): 0.1 10*3/uL (ref 0.0–0.4)
Eos: 2 %
Hematocrit: 36 % (ref 34.0–46.6)
Hemoglobin: 12 g/dL (ref 11.1–15.9)
Immature Grans (Abs): 0 10*3/uL (ref 0.0–0.1)
Immature Granulocytes: 0 %
Lymphocytes Absolute: 1.7 10*3/uL (ref 0.7–3.1)
Lymphs: 43 %
MCH: 29.1 pg (ref 26.6–33.0)
MCHC: 33.3 g/dL (ref 31.5–35.7)
MCV: 87 fL (ref 79–97)
Monocytes Absolute: 0.5 10*3/uL (ref 0.1–0.9)
Monocytes: 13 %
Neutrophils Absolute: 1.6 10*3/uL (ref 1.4–7.0)
Neutrophils: 41 %
Platelets: 220 10*3/uL (ref 150–450)
RBC: 4.12 x10E6/uL (ref 3.77–5.28)
RDW: 13.5 % (ref 11.7–15.4)
WBC: 3.9 10*3/uL (ref 3.4–10.8)

## 2019-04-19 LAB — HEMOGLOBIN A1C
Est. average glucose Bld gHb Est-mCnc: 140 mg/dL
Hgb A1c MFr Bld: 6.5 % — ABNORMAL HIGH (ref 4.8–5.6)

## 2019-04-23 ENCOUNTER — Ambulatory Visit: Payer: BC Managed Care – PPO | Admitting: Orthopaedic Surgery

## 2019-04-23 ENCOUNTER — Ambulatory Visit: Payer: Self-pay

## 2019-04-23 ENCOUNTER — Other Ambulatory Visit: Payer: Self-pay

## 2019-04-23 ENCOUNTER — Encounter: Payer: Self-pay | Admitting: Orthopaedic Surgery

## 2019-04-23 DIAGNOSIS — G8929 Other chronic pain: Secondary | ICD-10-CM

## 2019-04-23 DIAGNOSIS — M545 Low back pain, unspecified: Secondary | ICD-10-CM

## 2019-04-23 MED ORDER — METHOCARBAMOL 500 MG PO TABS: 500.0000 mg | ORAL_TABLET | Freq: Two times a day (BID) | ORAL | 0 refills | Status: DC | PRN

## 2019-04-23 NOTE — Progress Notes (Signed)
Office Visit Note   Patient: ANIIYAH Haney           Date of Birth: [birth year]           MRN: [id number] Visit Date: 04/23/2019              Requested by: Girtha Rm, NP-C [place],  [place] [id number] PCP: Girtha Rm, NP-C   Assessment & Plan: Visit Diagnoses:  1. Chronic right-sided low back pain, unspecified whether sciatica present     Plan: Impression is chronic right-sided lower back pain and right lower extremity radiculopathy.  Patient has failed oral anti-inflammatories as well as several months of physical therapy.  We will obtain an MRI of her lumbar spine at this point to further assess for structural abnormalities.  She will follow-up with [place] once that has been completed.  Follow-Up Instructions: Return for after  MRI.   Orders:  Orders Placed This Encounter  Procedures  . XR Lumbar Spine 2-3 Views   Meds ordered this encounter  Medications  . methocarbamol (ROBAXIN) 500 MG tablet    Sig: Take 1 tablet (500 mg total) by mouth 2 (two) times daily as needed.    Dispense:  20 tablet    Refill:  0      Procedures: No procedures performed   Clinical Data: No additional findings.   Subjective: Chief Complaint  Patient presents with  . Lower Back - Pain    HPI patient is a pleasant 54 year old female who comes in today with right lower back pain and right lower extremity radiculopathy.  She has had this for the past 1 to 2 years but has significantly worsened over the past 2 months.  The majority of her pain is to the right lower back and entire right thigh anterior, posterior and lateral.  She does note occasional pain to the right groin.  Pain is aggravated with lifting her backpack, putting on socks as well as lying on the right side.  She has tried ibuprofen without significant relief of symptoms.  She does note tingling to the right lower extremity.  No bowel or bladder change and no saddle paresthesias.  She has been seeing her  PCP for this where she has been in formal physical therapy for several months without relief of symptoms.  Review of Systems as detailed in HPI.  All others reviewed and are negative.   Objective: Vital Signs: LMP 04/25/2011   Physical Exam well-developed well-nourished female no acute distress.  Alert and oriented x3.  Ortho Exam examination of her lumbar spine reveals increased pain to the lower lumbar spine and paraspinous regions.  She has a positive straight leg raise.  Negative logroll.  Mild tenderness to the greater trochanter.  No focal weakness.  She is neurovascular intact distally.  Specialty Comments:  No specialty comments available.  Imaging: XR Lumbar Spine 2-3 Views  Result Date: 04/23/2019 Retrolisthesis at L1    PMFS History: Patient Active Problem List   Diagnosis Date Noted  . Paresthesia of both lower extremities 11/14/2018  . Elevated LDL cholesterol level 10/17/2018  . Proteinuria 10/17/2018  . History of 2019 novel coronavirus disease (COVID-19) 09/24/2018  . History of pneumonia 09/24/2018  . Controlled type 2 diabetes mellitus without complication, without long-term current use of insulin ([place]) 09/11/2018  . Acute on chronic respiratory failure with hypoxia ([place]) 09/05/2018  . Prediabetes 08/31/2018  . Hypokalemia 08/31/2018  . Allergic rhinitis due to pollen 06/12/2018  .  Dyslipidemia 06/05/2017  . Diabetes ([place])   . Hemorrhoids 03/15/2017  . Rectal bleeding 03/15/2017  . Essential hypertension 10/26/2015  . Obesity 10/26/2015  . Chronic vaginitis 10/26/2015  . Bipolar affective disorder ([place]) 10/26/2015  . Chest pain 07/14/2013  . Dysfunctional uterine bleeding 03/02/2011   Past Medical History:  Diagnosis Date  . Acute cholecystitis 07/14/2013  . Anemia    has history, takes iron  . Anxiety   . Bipolar affective disorder ([place]) 10/26/2015   Has seen counselor, declined medications per medical record from [facility]. hypermanic  . Chest pain  07/14/2013  . Chronic vaginitis 10/26/2015   Has h/o vag dryness and tried Estrace per [place] records  . COVID-19 virus infection 08/24/2018  . Depression   . Diabetes [facility])    Patient denies  . Diverticulitis    no problems  . Dysfunctional uterine bleeding 03/02/2011  . Elevated hemoglobin A1c 10/26/2015  . GERD (gastroesophageal reflux disease)    tums prn  . Headache(784.0)   . Hemorrhoids   . HTN (hypertension)    BP meds since fall 2016  . Internal hemorrhoids   . Nausea & vomiting 07/14/2013  . Obesity 10/26/2015  . Rectal bleeding 03/15/2017    Family History  Problem Relation Age of Onset  . Diabetes Mother   . Hypertension Mother   . Arthritis Father 82  . Hypertension Brother   . Cancer Brother        "in his blood"  . Diabetes Brother        diet controlled  . Cancer Brother        unsure kind  . CVA Other   . Colon cancer Neg Hx   . Colon polyps Neg Hx   . Esophageal cancer Neg Hx   . Rectal cancer Neg Hx   . Stomach cancer Neg Hx     Past Surgical History:  Procedure Laterality Date  . CHOLECYSTECTOMY N/A 07/15/2013   Procedure: LAPAROSCOPIC CHOLECYSTECTOMY;  Surgeon: Rolm Bookbinder, MD;  Location: [place];  Service: General;  Laterality: N/A;  . COLONOSCOPY    . ECTOPIC PREGNANCY SURGERY     laparotomy  . ROBOTIC ASSISTED LAP VAGINAL HYSTERECTOMY     Social History   Occupational History  . Occupation: Education officer, museum, Chief of Staff  Tobacco Use  . Smoking status: Former Smoker    Types: Cigarettes    Quit date: 04/18/2008    Years since quitting: 11.0  . Smokeless tobacco: Never Used  Substance and Sexual Activity  . Alcohol use: Yes    Comment: 1 glass of wine rarely, occasionally  . Drug use: No    Types: Cocaine    Comment: Former cocaine use since 2012  . Sexual activity: Yes    Birth control/protection: Surgical

## 2019-05-28 ENCOUNTER — Other Ambulatory Visit: Payer: Self-pay | Admitting: Internal Medicine

## 2019-05-28 ENCOUNTER — Other Ambulatory Visit: Payer: Self-pay | Admitting: Family Medicine

## 2019-05-28 ENCOUNTER — Encounter: Payer: Self-pay | Admitting: Family Medicine

## 2019-05-28 DIAGNOSIS — E782 Mixed hyperlipidemia: Secondary | ICD-10-CM

## 2019-05-28 MED ORDER — ATORVASTATIN CALCIUM 10 MG PO TABS: 10.0000 mg | ORAL_TABLET | Freq: Every day | ORAL | 0 refills | Status: DC

## 2019-05-28 NOTE — Telephone Encounter (Signed)
Sent this in already

## 2019-06-11 ENCOUNTER — Ambulatory Visit
Admission: RE | Admit: 2019-06-11 | Discharge: 2019-06-11 | Disposition: A | Discharge status: Home / Self Care | Payer: BC Managed Care – PPO | Source: Ambulatory Visit | Attending: Orthopaedic Surgery | Admitting: Orthopaedic Surgery

## 2019-06-11 DIAGNOSIS — M545 Low back pain, unspecified: Secondary | ICD-10-CM

## 2019-06-11 DIAGNOSIS — M5126 Other intervertebral disc displacement, lumbar region: Secondary | ICD-10-CM | POA: Diagnosis not present

## 2019-06-11 DIAGNOSIS — G8929 Other chronic pain: Secondary | ICD-10-CM

## 2019-06-13 ENCOUNTER — Other Ambulatory Visit: Payer: Self-pay

## 2019-06-13 ENCOUNTER — Encounter: Payer: Self-pay | Admitting: Orthopaedic Surgery

## 2019-06-13 ENCOUNTER — Ambulatory Visit: Payer: BC Managed Care – PPO | Admitting: Orthopaedic Surgery

## 2019-06-13 VITALS — Ht 66.0 in | Wt 196.0 lb

## 2019-06-13 DIAGNOSIS — G8929 Other chronic pain: Secondary | ICD-10-CM | POA: Diagnosis not present

## 2019-06-13 DIAGNOSIS — M545 Low back pain, unspecified: Secondary | ICD-10-CM

## 2019-06-13 NOTE — Progress Notes (Signed)
Office Visit Note   [name]           Date of Birth: [birth year]           MRN: [id number] Visit Date: 06/13/2019              Requested by: Girtha Rm, NP-C [place],  [place] [address] PCP: Girtha Rm, NP-C   Assessment & Plan: Visit Diagnoses:  1. Chronic right-sided low back pain, unspecified whether sciatica present     Plan: Impression is chronic right-sided lower back pain with right lower extremity radiculopathy.  MRI findings shows pondylosis of the lumbar spine L4-5 with moderate left and mild right neuroforaminal narrowing.  At this point, we will refer the patient to Dr. Ernestina Patches for [facility]  She will follow-up with [place] as needed.   Follow-Up Instructions: Return if symptoms worsen or fail to improve.   Orders:  No orders of the defined types were placed in this encounter.  No orders of the defined types were placed in this encounter.     Procedures: No procedures performed   Clinical Data: No additional findings.   Subjective: Chief Complaint  Patient presents with  . Lower Back - Follow-up    MRI review    HPI patient is a pleasant 54 year old female who comes in today with continued right lower back pain and right lower extremity radiculopathy.  She has failed oral anti-inflammatories as well as several months of physical therapy.  MRI of the lumbar spine was ordered which she is here today to discuss.  MRI shows spondylosis of the lumbar spine L4-5 with moderate left and mild right neuroforaminal narrowing.     Objective: Vital Signs: Ht 5\' 6"  (1.676 m)   Wt 196 lb (88.9 kg)   LMP 04/25/2011   BMI 31.64 kg/m     Ortho Exam stable lumbar exam  Specialty Comments:  No specialty comments available.  Imaging: No new imaging   PMFS History: Patient Active Problem List   Diagnosis Date Noted  . Paresthesia of both lower extremities 11/14/2018  . Elevated LDL cholesterol level 10/17/2018  .  Proteinuria 10/17/2018  . History of 2019 novel coronavirus disease (COVID-19) 09/24/2018  . History of pneumonia 09/24/2018  . Controlled type 2 diabetes mellitus without complication, without long-term current use of insulin ([place]) 09/11/2018  . Acute on chronic respiratory failure with hypoxia ([place]) 09/05/2018  . Prediabetes 08/31/2018  . Hypokalemia 08/31/2018  . Allergic rhinitis due to pollen 06/12/2018  . Dyslipidemia 06/05/2017  . Diabetes ([place])   . Hemorrhoids 03/15/2017  . Rectal bleeding 03/15/2017  . Essential hypertension 10/26/2015  . Obesity 10/26/2015  . Chronic vaginitis 10/26/2015  . Bipolar affective disorder ([place]) 10/26/2015  . Chest pain 07/14/2013  . Dysfunctional uterine bleeding 03/02/2011   Past Medical History:  Diagnosis Date  . Acute cholecystitis 07/14/2013  . Anemia    has history, takes iron  . Anxiety   . Bipolar affective disorder ([place]) 10/26/2015   Has seen counselor, declined medications per medical record from [facility]. hypermanic  . Chest pain 07/14/2013  . Chronic vaginitis 10/26/2015   Has h/o vag dryness and tried Estrace per [place] records  . COVID-19 virus infection 08/24/2018  . Depression   . Diabetes [facility])    Patient denies  . Diverticulitis    no problems  . Dysfunctional uterine bleeding 03/02/2011  . Elevated hemoglobin A1c 10/26/2015  . GERD (gastroesophageal reflux disease)  tums prn  . Headache(784.0)   . Hemorrhoids   . HTN (hypertension)    BP meds since fall 2016  . Internal hemorrhoids   . Nausea & vomiting 07/14/2013  . Obesity 10/26/2015  . Rectal bleeding 03/15/2017    Family History  Problem Relation Age of Onset  . Diabetes Mother   . Hypertension Mother   . Arthritis Father 61  . Hypertension Brother   . Cancer Brother        "in his blood"  . Diabetes Brother        diet controlled  . Cancer Brother        unsure kind  . CVA Other   . Colon cancer Neg Hx   . Colon polyps Neg Hx   . Esophageal cancer Neg  Hx   . Rectal cancer Neg Hx   . Stomach cancer Neg Hx     Past Surgical History:  Procedure Laterality Date  . CHOLECYSTECTOMY N/A 07/15/2013   Procedure: LAPAROSCOPIC CHOLECYSTECTOMY;  Surgeon: Rolm Bookbinder, MD;  Location: [place];  Service: General;  Laterality: N/A;  . COLONOSCOPY    . ECTOPIC PREGNANCY SURGERY     laparotomy  . ROBOTIC ASSISTED LAP VAGINAL HYSTERECTOMY     Social History   Occupational History  . Occupation: Education officer, museum, Chief of Staff  Tobacco Use  . Smoking status: Former Smoker    Types: Cigarettes    Quit date: 04/18/2008    Years since quitting: 11.1  . Smokeless tobacco: Never Used  Substance and Sexual Activity  . Alcohol use: Yes    Comment: 1 glass of wine rarely, occasionally  . Drug use: No    Types: Cocaine    Comment: Former cocaine use since 2012  . Sexual activity: Yes    Birth control/protection: Surgical

## 2019-07-03 ENCOUNTER — Other Ambulatory Visit: Payer: Self-pay | Admitting: Family Medicine

## 2019-07-03 DIAGNOSIS — Z1231 Encounter for screening mammogram for malignant neoplasm of breast: Secondary | ICD-10-CM

## 2019-07-04 ENCOUNTER — Encounter: Payer: BC Managed Care – PPO | Admitting: Physical Medicine and Rehabilitation

## 2019-07-04 ENCOUNTER — Other Ambulatory Visit: Payer: Self-pay

## 2019-07-04 ENCOUNTER — Ambulatory Visit
Admission: RE | Admit: 2019-07-04 | Discharge: 2019-07-04 | Disposition: A | Discharge status: Home / Self Care | Payer: BC Managed Care – PPO | Source: Ambulatory Visit | Attending: Family Medicine | Admitting: Family Medicine

## 2019-07-04 DIAGNOSIS — Z1231 Encounter for screening mammogram for malignant neoplasm of breast: Secondary | ICD-10-CM | POA: Diagnosis not present

## 2019-07-22 ENCOUNTER — Encounter: Payer: Self-pay | Admitting: Family Medicine

## 2019-07-24 ENCOUNTER — Other Ambulatory Visit: Payer: Self-pay | Admitting: Family Medicine

## 2019-07-24 ENCOUNTER — Ambulatory Visit (INDEPENDENT_AMBULATORY_CARE_PROVIDER_SITE_OTHER): Payer: BC Managed Care – PPO | Admitting: Family Medicine

## 2019-07-24 ENCOUNTER — Other Ambulatory Visit: Payer: Self-pay

## 2019-07-24 ENCOUNTER — Encounter: Payer: Self-pay | Admitting: Family Medicine

## 2019-07-24 VITALS — BP 120/70 | HR 70 | Temp 98.4°F | Wt 203.6 lb

## 2019-07-24 DIAGNOSIS — B379 Candidiasis, unspecified: Secondary | ICD-10-CM

## 2019-07-24 DIAGNOSIS — Z9189 Other specified personal risk factors, not elsewhere classified: Secondary | ICD-10-CM

## 2019-07-24 DIAGNOSIS — M545 Low back pain, unspecified: Secondary | ICD-10-CM

## 2019-07-24 DIAGNOSIS — B9689 Other specified bacterial agents as the cause of diseases classified elsewhere: Secondary | ICD-10-CM | POA: Diagnosis not present

## 2019-07-24 DIAGNOSIS — N760 Acute vaginitis: Secondary | ICD-10-CM

## 2019-07-24 DIAGNOSIS — N898 Other specified noninflammatory disorders of vagina: Secondary | ICD-10-CM

## 2019-07-24 DIAGNOSIS — R8290 Unspecified abnormal findings in urine: Secondary | ICD-10-CM | POA: Diagnosis not present

## 2019-07-24 DIAGNOSIS — E119 Type 2 diabetes mellitus without complications: Secondary | ICD-10-CM

## 2019-07-24 DIAGNOSIS — I10 Essential (primary) hypertension: Secondary | ICD-10-CM

## 2019-07-24 LAB — POCT WET PREP (WET MOUNT)
Clue Cells Wet Prep Whiff POC: POSITIVE
Trichomonas Wet Prep HPF POC: ABSENT

## 2019-07-24 LAB — POCT URINALYSIS DIP (PROADVANTAGE DEVICE)
Bilirubin, UA: NEGATIVE
Blood, UA: NEGATIVE
Glucose, UA: NEGATIVE mg/dL
Ketones, POC UA: NEGATIVE mg/dL
Leukocytes, UA: NEGATIVE
Nitrite, UA: NEGATIVE
Protein Ur, POC: NEGATIVE mg/dL
Specific Gravity, Urine: 1.025
Urobilinogen, Ur: NEGATIVE
pH, UA: 6 (ref 5.0–8.0)

## 2019-07-24 MED ORDER — FLUCONAZOLE 150 MG PO TABS: 150.0000 mg | ORAL_TABLET | Freq: Once | ORAL | 0 refills | Status: AC

## 2019-07-24 MED ORDER — METRONIDAZOLE 500 MG PO TABS: 500.0000 mg | ORAL_TABLET | Freq: Two times a day (BID) | ORAL | 0 refills | Status: DC

## 2019-07-24 NOTE — Progress Notes (Signed)
   Subjective:    Patient ID: Deborah Haney, female    DOB: [birth year], 54 y.o.   MRN: [id number]  HPI Chief Complaint  Patient presents with  . STD    heavy  yellow discharge, fishy odor,  x 4 days.    Here with complaints of of a 4 day history of yellow, thick vaginal discharge, itching, and odor. She also reports pain in her vulva. She also complaints of low back pain. Recent unprotected sex.   States her urine is also cloudy.   Denies fever, chills, abdominal pain, N/V. Diarrhea 2 days ago.   Reviewed allergies, medications, past medical, surgical, family, and social history.    Review of Systems Pertinent positives and negatives in the history of present illness.     Objective:   Physical Exam BP 120/70   Pulse 70   Temp 98.4 F (36.9 C)   Wt 203 lb 9.6 oz (92.4 kg)   LMP 04/25/2011   BMI 32.86 kg/m   Alert and oriented in no acute distress.  Abdomen soft, nondistended, mild tenderness to suprapubic area without rebound or guarding, no CVAT.  Pelvic exam shows erythema, thick and white discharge in the vaginal vault.  No lesions.  Chaperone present.      Assessment & Plan:  Vaginal discharge - Plan: NuSwab Vaginitis Plus (VG+), POCT Wet Prep ([place]et [place]), RPR, HIV Antibody (routine testing w rflx), fluconazole (DIFLUCAN) 150 MG tablet  Acute bilateral low back pain without sciatica - Plan: POCT Urinalysis DIP (Proadvantage Device)  Cloudy urine - Plan: POCT Urinalysis DIP (Proadvantage Device)  At risk for sexually transmitted disease due to unprotected sex - Plan: NuSwab Vaginitis Plus (VG+), POCT Wet Prep ([place]et [place]), RPR, HIV Antibody (routine testing w rflx)  BV (bacterial vaginosis) - Plan: NuSwab Vaginitis Plus (VG+), POCT Wet Prep ([place]et [place]), metroNIDAZOLE (FLAGYL) 500 MG tablet  Vagina itching  Candidiasis - Plan: fluconazole (DIFLUCAN) 150 MG tablet  Urinalysis dipstick negative Wet prep shows positive clue cells, positive whiff.  BV diagnosed.   Yeast also present. New swab ordered for GC/CT, trichomonas.  HIV, RPR ordered. Advised to increase water intake.  Avoid sexual activity. I will prescribe metronidazole and Diflucan.  She will avoid alcohol with the metronidazole. Follow-up pending further results or if she is worsening.

## 2019-07-24 NOTE — Patient Instructions (Addendum)
Avoid any sexual activity until your other results are back and you are one week from treatment. I will be in touch.   Avoid alcohol with metronidazole. Take until completed.

## 2019-07-25 LAB — HIV ANTIBODY (ROUTINE TESTING W REFLEX): HIV Screen 4th Generation wRfx: NONREACTIVE

## 2019-07-25 LAB — RPR: RPR Ser Ql: NONREACTIVE

## 2019-07-27 LAB — NUSWAB VAGINITIS PLUS (VG+)
Atopobium vaginae: HIGH Score — AB
BVAB 2: HIGH Score — AB
Candida albicans, NAA: POSITIVE — AB
Candida glabrata, NAA: NEGATIVE
Chlamydia trachomatis, NAA: NEGATIVE
Neisseria gonorrhoeae, NAA: NEGATIVE
Trich vag by NAA: NEGATIVE

## 2019-07-29 ENCOUNTER — Other Ambulatory Visit: Payer: Self-pay

## 2019-07-29 ENCOUNTER — Ambulatory Visit: Payer: Self-pay

## 2019-07-29 ENCOUNTER — Ambulatory Visit: Payer: BC Managed Care – PPO | Admitting: Physical Medicine and Rehabilitation

## 2019-07-29 VITALS — BP 136/89 | HR 76

## 2019-07-29 DIAGNOSIS — M5416 Radiculopathy, lumbar region: Secondary | ICD-10-CM

## 2019-07-29 MED ORDER — METHYLPREDNISOLONE ACETATE 80 MG/ML IJ SUSP
40.0000 mg | Freq: Once | INTRAMUSCULAR | Status: AC
  Administered 2019-07-29: 40 mg

## 2019-07-29 NOTE — Progress Notes (Signed)
Deborah Haney - 54 y.o. female MRN [id number]  Date of birth: 09-May-1965  Office Visit Note: Visit Date: 07/29/2019 PCP: Girtha Rm, NP-C Referred by: Girtha Rm, NP-C  Subjective: Chief Complaint  Patient presents with  . Lower Back - Pain   HPI:  Deborah Haney is a 54 y.o. female who comes in today for planned Right L4-L5 lumbar epidural steroid injection with fluoroscopic guidance.  The patient has failed conservative care including home exercise, medications, time and activity modification.  This injection will be diagnostic and hopefully therapeutic.  Please see requesting physician notes for further details and justification.   ROS Otherwise per HPI.  Assessment & Plan: Visit Diagnoses:  1. Lumbar radiculopathy     Plan: No additional findings.   Meds & Orders:  Meds ordered this encounter  Medications  . methylPREDNISolone acetate (DEPO-MEDROL) injection 40 mg    Orders Placed This Encounter  Procedures  . XR C-ARM NO REPORT  . Epidural Steroid injection    Follow-up: Return if symptoms worsen or fail to improve.   Procedures: No procedures performed  Lumbar Epidural Steroid Injection - Interlaminar Approach with Fluoroscopic Guidance  [name]      Date of Birth: June 22, 1965 MRN: [id number] PCP: Girtha Rm, NP-C      Visit Date: 07/29/2019   Universal Protocol:     Consent Given By: the patient  Position: PRONE  Additional Comments: Vital signs were monitored before and after the procedure. Patient was prepped and draped in the usual sterile fashion. The correct patient, procedure, and site was verified.   Injection Procedure Details:  Procedure Site One Meds Administered:  Meds ordered this encounter  Medications  . methylPREDNISolone acetate (DEPO-MEDROL) injection 40 mg     Laterality: Right  Location/Site:  L4-L5  Needle size: 20 G  Needle type: Tuohy  Needle Placement: Paramedian  epidural  Findings:   -Comments: Excellent flow of contrast into the epidural space.  Procedure Details: Using a paramedian approach from the side mentioned above, the region overlying the inferior lamina was localized under fluoroscopic visualization and the soft tissues overlying this structure were infiltrated with 4 ml. of 1% Lidocaine without Epinephrine. The Tuohy needle was inserted into the epidural space using a paramedian approach.   The epidural space was localized using loss of resistance along with lateral and bi-planar fluoroscopic views.  After negative aspirate for air, blood, and CSF, a 2 ml. volume of Isovue-250 was injected into the epidural space and the flow of contrast was observed. Radiographs were obtained for documentation purposes.    The injectate was administered into the level noted above.   Additional Comments:  The patient tolerated the procedure well Dressing: 2 x 2 sterile gauze and Band-Aid    Post-procedure details: Patient was observed during the procedure. Post-procedure instructions were reviewed.  Patient left the clinic in stable condition.    Clinical History: MRI LUMBAR SPINE WITHOUT CONTRAST  TECHNIQUE: Multiplanar, multisequence MR imaging of the lumbar spine was performed. No intravenous contrast was administered.  COMPARISON:  None.  FINDINGS: Segmentation: There are 5 non-rib bearing lumbar type vertebral bodies with the last intervertebral disc space labeled as L5-S1.  Alignment:  Normal  Vertebrae: The vertebral body heights are well maintained. No fracture, marrow edema,or pathologic marrow infiltration.  Conus medullaris and cauda equina: Conus extends to the L1 level. Conus and cauda equina appear normal.  Paraspinal and other soft tissues: The paraspinal soft tissues  and visualized retroperitoneal structures are unremarkable. The sacroiliac joints are intact.  Disc levels:  T12-L1:  No significant canal  or neural foraminal narrowing.  L1-L2:   No significant canal or neural foraminal narrowing.  L2-L3:   No significant canal or neural foraminal narrowing.  L3-L4: There is a broad-based disc bulge with facet arthrosis and ligamentum flavum hypertrophy which causes mild bilateral neural foraminal narrowing and mild effacement anterior thecal sac.  L4-L5: There is a broad-based disc bulge with ligamentum flavum hypertrophy and facet arthrosis which causes moderate left and mild right neural foraminal narrowing.  L5-S1: There is a broad-based disc bulge and facet arthrosis which causes mild bilateral neural foraminal narrowing.  IMPRESSION: Lumbar spine spondylosis most notable at L4-L5 with moderate left and mild right neural foraminal narrowing and mild effacement anterior thecal sac.   Electronically Signed   By: Prudencio Pair M.D.   On: 06/11/2019 13:44     Objective:  VS:  HT:    WT:   BMI:     BP:136/89  HR:76bpm  TEMP: ( )  RESP:  Physical Exam Constitutional:      General: She is not in acute distress.    Appearance: Normal appearance. She is not ill-appearing.  HENT:     Head: Normocephalic and atraumatic.     Right Ear: External ear normal.     Left Ear: External ear normal.  Eyes:     Extraocular Movements: Extraocular movements intact.  Cardiovascular:     Rate and Rhythm: Normal rate.     Pulses: Normal pulses.  Musculoskeletal:     Right lower leg: No edema.     Left lower leg: No edema.     Comments: Patient has good distal strength with no pain over the greater trochanters.  No clonus or focal weakness.  Skin:    Findings: No erythema, lesion or rash.  Neurological:     General: No focal deficit present.     Mental Status: She is alert and oriented to person, place, and time.     Sensory: No sensory deficit.     Motor: No weakness or abnormal muscle tone.     Coordination: Coordination normal.  Psychiatric:        Mood and Affect: Mood  normal.        Behavior: Behavior normal.      Imaging: No results found.

## 2019-07-29 NOTE — Progress Notes (Signed)
Pt states pain is in the lower back with more pain on the right side with pain in the groin and down right leg. Pt states pain is better with sleeping with a pillow between her legs and biofreeze. Pt states pain is worse with walking, rolling on sides at night and carrying heavy objects.  Numeric Pain Rating Scale and Functional Assessment Average Pain (9)   In the last MONTH (on 0-10 scale) has pain interfered with the following?  1. General activity like being  able to carry out your everyday physical activities such as walking, climbing stairs, carrying groceries, or moving a chair?  Rating(9)   +Driver, -BT, -Dye Allergies.

## 2019-07-29 NOTE — Procedures (Signed)
Lumbar Epidural Steroid Injection - Interlaminar Approach with Fluoroscopic Guidance  [name]      Date of Birth: [birth year] MRN: [id number] PCP: Girtha Rm, NP-C      Visit Date: 07/29/2019   Universal Protocol:     Consent Given By: the patient  Position: PRONE  Additional Comments: Vital signs were monitored before and after the procedure. Patient was prepped and draped in the usual sterile fashion. The correct patient, procedure, and site was verified.   Injection Procedure Details:  Procedure Site One Meds Administered:  Meds ordered this encounter  Medications  . methylPREDNISolone acetate (DEPO-MEDROL) injection 40 mg     Laterality: Right  Location/Site:  L4-L5  Needle size: 20 G  Needle type: Tuohy  Needle Placement: Paramedian epidural  Findings:   -Comments: Excellent flow of contrast into the epidural space.  Procedure Details: Using a paramedian approach from the side mentioned above, the region overlying the inferior lamina was localized under fluoroscopic visualization and the soft tissues overlying this structure were infiltrated with 4 ml. of 1% Lidocaine without Epinephrine. The Tuohy needle was inserted into the epidural space using a paramedian approach.   The epidural space was localized using loss of resistance along with lateral and bi-planar fluoroscopic views.  After negative aspirate for air, blood, and CSF, a 2 ml. volume of Isovue-250 was injected into the epidural space and the flow of contrast was observed. Radiographs were obtained for documentation purposes.    The injectate was administered into the level noted above.   Additional Comments:  The patient tolerated the procedure well Dressing: 2 x 2 sterile gauze and Band-Aid    Post-procedure details: Patient was observed during the procedure. Post-procedure instructions were reviewed.  Patient left the clinic in stable condition.

## 2019-08-03 ENCOUNTER — Encounter: Payer: Self-pay | Admitting: Physical Medicine and Rehabilitation

## 2019-08-28 ENCOUNTER — Telehealth: Payer: Self-pay | Admitting: Gastroenterology

## 2019-08-28 NOTE — Telephone Encounter (Signed)
The pt has a history of hemorrhoids and Dr Hilarie Fredrickson banded internal hemorrhoids.  She began to have BRBPR with bowel movement yesterday that was enough to get on the floor.  She says no bleeding today. Appt made to see Janett Billow on 6/25.  She will go to the ED if the bleeding is severe prior to that appt

## 2019-08-30 ENCOUNTER — Ambulatory Visit: Payer: BC Managed Care – PPO | Admitting: Gastroenterology

## 2019-08-30 ENCOUNTER — Encounter: Payer: Self-pay | Admitting: Gastroenterology

## 2019-08-30 VITALS — BP 124/82 | HR 79 | Ht 66.0 in | Wt 203.0 lb

## 2019-08-30 DIAGNOSIS — K648 Other hemorrhoids: Secondary | ICD-10-CM | POA: Diagnosis not present

## 2019-08-30 DIAGNOSIS — K625 Hemorrhage of anus and rectum: Secondary | ICD-10-CM

## 2019-08-30 DIAGNOSIS — R194 Change in bowel habit: Secondary | ICD-10-CM | POA: Insufficient documentation

## 2019-08-30 DIAGNOSIS — R195 Other fecal abnormalities: Secondary | ICD-10-CM | POA: Diagnosis not present

## 2019-08-30 DIAGNOSIS — K5900 Constipation, unspecified: Secondary | ICD-10-CM | POA: Insufficient documentation

## 2019-08-30 NOTE — Patient Instructions (Signed)
If you are age 52 or older, your body mass index should be between 23-30. Your Body mass index is 32.77 kg/m. If this is out of the aforementioned range listed, please consider follow up with your Primary Care Provider.  If you are age 30 or younger, your body mass index should be between 19-25. Your Body mass index is 32.77 kg/m. If this is out of the aformentioned range listed, please consider follow up with your Primary Care Provider.   HEMORRHOID BANDING PROCEDURE    FOLLOW-UP CARE   1. The procedure you have had should have been relatively painless since the banding of the area involved does not have nerve endings and there is no pain sensation.  The rubber band cuts off the blood supply to the hemorrhoid and the band may fall off as soon as 48 hours after the banding (the band may occasionally be seen in the toilet bowl following a bowel movement). You may notice a temporary feeling of fullness in the rectum which should respond adequately to plain Tylenol or Motrin.  2. Following the banding, avoid strenuous exercise that evening and resume full activity the next day.  A sitz bath (soaking in a warm tub) or bidet is soothing, and can be useful for cleansing the area after bowel movements.     3. To avoid constipation, take two tablespoons of natural wheat bran, natural oat bran, flax, Benefiber or any over the counter fiber supplement and increase your water intake to 7-8 glasses daily.    4. Unless you have been prescribed anorectal medication, do not put anything inside your rectum for two weeks: No suppositories, enemas, fingers, etc.  5. Occasionally, you may have more bleeding than usual after the banding procedure.  This is often from the untreated hemorrhoids rather than the treated one.  Don't be concerned if there is a tablespoon or so of blood.  If there is more blood than this, lie flat with your bottom higher than your head and apply an ice pack to the area. If the bleeding  does not stop within a half an hour or if you feel faint, call our office at [phone] or go to the emergency room.  6. Problems are not common; however, if there is a substantial amount of bleeding, severe pain, chills, fever or difficulty passing urine (very rare) or other problems, you should call us at [phone] or report to the nearest emergency room.  7. Do not stay seated continuously for more than 2-3 hours for a day or two after the procedure.  Tighten your buttock muscles 10-15 times every two hours and take 10-15 deep breaths every 1-2 hours.  Do not spend more than a few minutes on the toilet if you cannot empty your bowel; instead re-visit the toilet at a later time.

## 2019-08-30 NOTE — Progress Notes (Signed)
08/30/2019 Deborah Haney [id number] 11/19/1965   HISTORY OF PRESENT ILLNESS: This is a 54 year old female who is a patient of Dr. Ardis Hughs.  She had 2 hemorrhoid bandings in 2019 by Dr. Hilarie Fredrickson.  She then underwent colonoscopy by Dr. Ardis Hughs in August 2020 at which time she had 2 polyps that were removed, one was a hyperplastic polyp and one was a tubular adenoma.  Also noted to have medium sized internal and external hemorrhoids.  Was recommended that she follow-up for further hemorrhoid banding.  Did not present back to our office until today.  Reports frequent/ongoing rectal bleeding to the point that it is dripping out of her bottom onto the floor, etc.  She also reports a change in her bowel habits with very soft/unformed stools.  Says that has been present for about the past 2 months or so.  She says that she very rarely has a solid stool recently.  She does admit to being under a lot of stress with school, etc.  She says that she has been eating a "vast amount of cherries" as well.  Admits that she had taken a fiber supplement in the past, but is not good about taking things regularly/consistently.   Past Medical History:  Diagnosis Date  . Acute cholecystitis 07/14/2013  . Anemia    has history, takes iron  . Anxiety   . Bipolar affective disorder ([place]) 10/26/2015   Has seen counselor, declined medications per medical record from [facility]. hypermanic  . Chest pain 07/14/2013  . Chronic vaginitis 10/26/2015   Has h/o vag dryness and tried Estrace per [place] records  . COVID-19 virus infection 08/24/2018  . Depression   . Diabetes [facility])    Patient denies  . Diverticulitis    no problems  . Dysfunctional uterine bleeding 03/02/2011  . Elevated hemoglobin A1c 10/26/2015  . GERD (gastroesophageal reflux disease)    tums prn  . Headache(784.0)   . Hemorrhoids   . HTN (hypertension)    BP meds since fall 2016  . Internal hemorrhoids   . Nausea & vomiting 07/14/2013  . Obesity 10/26/2015    . Rectal bleeding 03/15/2017   Past Surgical History:  Procedure Laterality Date  . CHOLECYSTECTOMY N/A 07/15/2013   Procedure: LAPAROSCOPIC CHOLECYSTECTOMY;  Surgeon: Rolm Bookbinder, MD;  Location: [facility];  Service: General;  Laterality: N/A;  . COLONOSCOPY    . ECTOPIC PREGNANCY SURGERY     laparotomy  . ROBOTIC ASSISTED LAP VAGINAL HYSTERECTOMY      reports that she quit smoking about 11 years ago. Her smoking use included cigarettes. She has never used smokeless tobacco. She reports current alcohol use. She reports that she does not use drugs. family history includes Arthritis (age of onset: 74) in her father; CVA in an other family member; Cancer in her brother and brother; Diabetes in her mother; Hypertension in her brother and mother. No Known Allergies    Outpatient Encounter Medications as of 08/30/2019  Medication Sig  . atorvastatin (LIPITOR) 10 MG tablet Take 1 tablet (10 mg total) by mouth daily.  . Blood Glucose Monitoring Suppl (CONTOUR MONITOR) w/Device KIT 1 kit by Does not apply route 2 (two) times a day.  Marland Kitchen glucose blood (CONTOUR NEXT TEST) test strip Test twice a day  . ibuprofen (ADVIL) 800 MG tablet Take 800 mg by mouth every 6 (six) hours as needed.  Marland Kitchen lisinopril-hydrochlorothiazide (ZESTORETIC) 10-12.5 MG tablet Take 1 tablet by mouth once daily  . methocarbamol (ROBAXIN)  500 MG tablet Take 1 tablet (500 mg total) by mouth 2 (two) times daily as needed.  . Microlet Lancets MISC Test blood sugar twice a day  . Multiple Vitamin (MULTIVITAMIN) tablet Take 1 tablet by mouth daily.  . mupirocin ointment (BACTROBAN) 2 % Apply thin layer to affected area 3 times daily x 7 days.  . [DISCONTINUED] fluconazole (DIFLUCAN) 150 MG tablet Take 150 mg by mouth once.  . [DISCONTINUED] metroNIDAZOLE (FLAGYL) 500 MG tablet Take 1 tablet (500 mg total) by mouth 2 (two) times daily.   No facility-administered encounter medications on file as of 08/30/2019.     REVIEW OF SYSTEMS   : All other systems reviewed and negative except where noted in the History of Present Illness.   PHYSICAL EXAM: BP 124/82   Pulse 79   Ht '5\' 6"'  (1.676 m)   Wt 203 lb (92.1 kg)   LMP 04/25/2011   BMI 32.77 kg/m  General: Well developed AA female in no acute distress Head: Normocephalic and atraumatic Eyes:  Sclerae anicteric, conjunctiva pink. Ears: Normal auditory acuity Lungs: Clear throughout to auscultation; no increased WOB. Heart: Regular rate and rhythm; no M/R/G. Abdomen: Soft, non-distended.  BS present.  Non-tender. Rectal:  External hemorrhoids seen.  No masses noted on DRE, but bright red blood was present.  Anoscopy revealed large bleeding hemorrhoid (? Right posterior position). Musculoskeletal: Symmetrical with no gross deformities  Skin: No lesions on visible extremities Extremities: No edema  Neurological: Alert oriented x 4, grossly non-focal Psychological:  Alert and cooperative. Normal mood and affect  ASSESSMENT AND PLAN: *Rectal bleeding: Secondary to large internal hemorrhoids seen on anoscopic exam today.  Status post banding per Dr. Carlean Purl as listed above.  Post-procedure instructions given. *Altered bowel habits with loose stools: She admits that she has been eating a lot of cherries recently so question if this could be influencing that.  Is also under a lot of stress so ? Component of IBS.  She had taken fiber supplements in the past and may benefit from doing that in order to help bulk her stools.  She will begin taking Benefiber or Citrucel 2 teaspoons in 6 to 8 ounces of liquid daily and increase to twice daily if tolerated.   CC:  Henson, Vickie L, NP-C

## 2019-09-02 NOTE — Progress Notes (Signed)
I agree with the above note, plan 

## 2019-09-02 NOTE — Progress Notes (Signed)
PROCEDURE NOTE: The patient presents with symptomatic grade 2hemorrhoids, requesting rubber band ligation of his/her hemorrhoidal disease.  All risks, benefits and alternative forms of therapy were described and informed consent was obtained.   The anorectum was pre-medicated with 0.125% NTG and 5% lidocaineThe decision was made to band the RP internal hemorrhoid, and the [place] was used to perform band ligation without complication.  Digital anorectal examination was then performed to assure proper positioning of the band, and to adjust the banded tissue as required.  The patient was discharged home without pain or other issues.  Dietary and behavioral recommendations were given and along with follow-up instructions.       The patient will return in a few weeks for  follow-up and possible additional banding as required. No complications were encountered and the patient tolerated the procedure well.  Gatha Mayer, MD, [facility]

## 2019-09-11 ENCOUNTER — Encounter: Payer: Self-pay | Admitting: Orthopaedic Surgery

## 2019-09-13 ENCOUNTER — Other Ambulatory Visit: Payer: Self-pay

## 2019-09-13 DIAGNOSIS — G8929 Other chronic pain: Secondary | ICD-10-CM

## 2019-09-13 DIAGNOSIS — M545 Low back pain, unspecified: Secondary | ICD-10-CM

## 2019-09-13 NOTE — Telephone Encounter (Signed)
Please refer for another ESI.  Thanks.

## 2019-09-18 ENCOUNTER — Ambulatory Visit: Payer: Self-pay

## 2019-09-18 ENCOUNTER — Ambulatory Visit: Payer: BC Managed Care – PPO | Admitting: Physical Medicine and Rehabilitation

## 2019-09-18 ENCOUNTER — Encounter: Payer: Self-pay | Admitting: Physical Medicine and Rehabilitation

## 2019-09-18 ENCOUNTER — Other Ambulatory Visit: Payer: Self-pay

## 2019-09-18 VITALS — BP 143/92 | HR 77

## 2019-09-18 DIAGNOSIS — M47816 Spondylosis without myelopathy or radiculopathy, lumbar region: Secondary | ICD-10-CM

## 2019-09-18 DIAGNOSIS — M545 Other chronic pain: Secondary | ICD-10-CM

## 2019-09-18 DIAGNOSIS — G8929 Other chronic pain: Secondary | ICD-10-CM

## 2019-09-18 DIAGNOSIS — M5416 Radiculopathy, lumbar region: Secondary | ICD-10-CM

## 2019-09-18 MED ORDER — METHYLPREDNISOLONE ACETATE 80 MG/ML IJ SUSP
40.0000 mg | Freq: Once | INTRAMUSCULAR | Status: AC
  Administered 2019-09-18: 40 mg

## 2019-09-18 NOTE — Progress Notes (Signed)
Pt states her lower right back pain. Pt states she has a hard time sitting up out the bed. Pt state in the middle of the night she has bad pain. Pt state sometime when she stand a long time the pain travel down her right leg. Pt states laying and sitting makes the pain worse. Pt states putting heat and use extra pillows for support helps the pain.    Numeric Pain Rating Scale and Functional Assessment Average Pain 8   In the last MONTH (on 0-10 scale) has pain interfered with the following?  1. General activity like being  able to carry out your everyday physical activities such as walking, climbing stairs, carrying groceries, or moving a chair?  Rating(10)   +Driver, -BT, -Dye Allergies.

## 2019-09-25 ENCOUNTER — Encounter: Payer: Self-pay | Admitting: Medical

## 2019-09-25 ENCOUNTER — Other Ambulatory Visit: Payer: Self-pay

## 2019-09-25 ENCOUNTER — Encounter: Payer: Self-pay | Admitting: Family Medicine

## 2019-09-25 ENCOUNTER — Ambulatory Visit (INDEPENDENT_AMBULATORY_CARE_PROVIDER_SITE_OTHER): Payer: BC Managed Care – PPO | Admitting: Medical

## 2019-09-25 VITALS — BP 120/86 | HR 77 | Temp 98.2°F | Ht 66.0 in | Wt 203.6 lb

## 2019-09-25 DIAGNOSIS — R350 Frequency of micturition: Secondary | ICD-10-CM | POA: Diagnosis not present

## 2019-09-25 DIAGNOSIS — R310 Gross hematuria: Secondary | ICD-10-CM | POA: Diagnosis not present

## 2019-09-25 DIAGNOSIS — R300 Dysuria: Secondary | ICD-10-CM | POA: Diagnosis not present

## 2019-09-25 DIAGNOSIS — E119 Type 2 diabetes mellitus without complications: Secondary | ICD-10-CM

## 2019-09-25 LAB — POCT URINALYSIS DIP (PROADVANTAGE DEVICE)
Bilirubin, UA: NEGATIVE
Glucose, UA: NEGATIVE mg/dL
Ketones, POC UA: NEGATIVE mg/dL
Nitrite, UA: NEGATIVE
Protein Ur, POC: 30 mg/dL — AB
Specific Gravity, Urine: 1.01
Urobilinogen, Ur: NEGATIVE
pH, UA: 6 (ref 5.0–8.0)

## 2019-09-25 MED ORDER — SULFAMETHOXAZOLE-TRIMETHOPRIM 800-160 MG PO TABS: 1.0000 | ORAL_TABLET | Freq: Two times a day (BID) | ORAL | 0 refills | Status: DC

## 2019-09-25 MED ORDER — SULFAMETHOXAZOLE-TRIMETHOPRIM 800-160 MG PO TABS
1.0000 | ORAL_TABLET | Freq: Two times a day (BID) | ORAL | 0 refills | Status: DC
Start: 2019-09-25 — End: 2019-09-25

## 2019-09-25 NOTE — Progress Notes (Signed)
Subjective:  Deborah Haney is a 54 y.o. female who presents for Chief Complaint  Patient presents with  . Urinary Tract Infection    painful, burning, frequnt urination-with blood last nigh      Here for 1 day hx/o possible UTI.  She notes painful urination, frequency, burning.  initially pink coloration, then on subsequent urination was having specks of blood.  Still saw blood this morning on tissue and in bowl.   No fever.   No nausea, no vomiting.  No chills.  Does hurt in general in body.   Has a loose stool yesterday. No vaginal discharge.  May have seen urology in past?  Not sure. No prior hospitalization for UTI  Has had unprotected sex with same partner long term, no particular std concern today.   Gets STD screen periodically.  Hasn't been checking sugars of late.  Is diabetic, diet controlled  No other aggravating or relieving factors.    No other c/o.  Past Medical History:  Diagnosis Date  . Acute cholecystitis 07/14/2013  . Anemia    has history, takes iron  . Anxiety   . Bipolar affective disorder ([place]) 10/26/2015   Has seen counselor, declined medications per medical record from [facility]. hypermanic  . Chest pain 07/14/2013  . Chronic vaginitis 10/26/2015   Has h/o vag dryness and tried Estrace per [facility] records  . COVID-19 virus infection 08/24/2018  . Depression   . Diabetes [facility]    Patient denies  . Diverticulitis    no problems  . Dysfunctional uterine bleeding 03/02/2011  . Elevated hemoglobin A1c 10/26/2015  . GERD (gastroesophageal reflux disease)    tums prn  . Headache(784.0)   . Hemorrhoids   . HTN (hypertension)    BP meds since fall 2016  . Internal hemorrhoids   . Nausea & vomiting 07/14/2013  . Obesity 10/26/2015  . Rectal bleeding 03/15/2017   Current Outpatient Medications on File Prior to Visit  Medication Sig Dispense Refill  . atorvastatin (LIPITOR) 10 MG tablet Take 1 tablet (10 mg total) by mouth daily. 90 tablet 0  . Blood Glucose  Monitoring Suppl (CONTOUR MONITOR) w/Device KIT 1 kit by Does not apply route 2 (two) times a day. 1 kit 0  . glucose blood (CONTOUR NEXT TEST) test strip Test twice a day 100 each 1  . ibuprofen (ADVIL) 800 MG tablet Take 800 mg by mouth every 6 (six) hours as needed.    Marland Kitchen lisinopril-hydrochlorothiazide (ZESTORETIC) 10-12.5 MG tablet Take 1 tablet by mouth once daily 30 tablet 2  . Microlet Lancets MISC Test blood sugar twice a day 100 each 1  . Multiple Vitamin (MULTIVITAMIN) tablet Take 1 tablet by mouth daily.    . methocarbamol (ROBAXIN) 500 MG tablet Take 1 tablet (500 mg total) by mouth 2 (two) times daily as needed. (Patient not taking: Reported on 09/25/2019) 20 tablet 0  . mupirocin ointment (BACTROBAN) 2 % Apply thin layer to affected area 3 times daily x 7 days. (Patient not taking: Reported on 09/25/2019) 22 g 0   Current Facility-Administered Medications on File Prior to Visit  Medication Dose Route Frequency Provider Last Rate Last Admin  . methylPREDNISolone acetate (DEPO-MEDROL) injection 40 mg  40 mg Other Once Magnus Sinning, MD         The following portions of the patient's history were reviewed and updated as appropriate: allergies, current medications, past family history, past medical history, past social history, past surgical history and problem list.  ROS Otherwise as in subjective above    Objective: BP 120/86   Pulse 77   Temp 98.2 F (36.8 C)   Ht _0  (1.676 m)   Wt 203 lb 9.6 oz (92.4 kg)   LMP 04/25/2011   SpO2 97%   BMI 32.86 kg/m   General appearance: alert, no distress, well developed, well nourished Abdomen: +bs, soft, non tender, non distended, no masses, no hepatomegaly, no splenomegaly Back: no CVA tenderness Pulses: 2+ radial pulses, 2+ pedal pulses, normal cap refill Ext: no edema     Assessment: Encounter Diagnoses  Name Primary?  . Burning with urination Yes  . Frequent urination   . Gross hematuria   . Controlled type 2  diabetes mellitus without complication, without long-term current use of insulin ([place])      Plan: Begin Bactrim antibiotic.  Discussed risk and benefits of medication.  Urine culture sent.  Currently she has specks of blood in the urine.  Increase hydration, can use cranberry juice or lemon water in the next few days.  Advised that if worsening symptoms in the next 48 hours such as diffuse gross hematuria, fever, body aches or chills, nausea or vomiting, significantly worse overall then call recheck or go to emergency department  She will follow up as scheduled with her PCP here soon for her routine diabetic follow-up.  I asked her to start checking her sugars at least 2 to 3 days/week fasting in advance of this appointment  [name] was seen today for urinary tract infection.  Diagnoses and all orders for this visit:  Burning with urination -     POCT Urinalysis DIP (Proadvantage Device) -     Urine Culture  Frequent urination -     POCT Urinalysis DIP (Proadvantage Device) -     Urine Culture  Gross hematuria  Controlled type 2 diabetes mellitus without complication, without long-term current use of insulin ([facility])  Other orders -     sulfamethoxazole-trimethoprim (BACTRIM DS) 800-160 MG tablet; Take 1 tablet by mouth 2 (two) times daily.    Follow up: pending culture

## 2019-09-27 LAB — URINE CULTURE

## 2019-09-28 ENCOUNTER — Other Ambulatory Visit: Payer: Self-pay | Admitting: Family Medicine

## 2019-09-28 DIAGNOSIS — E782 Mixed hyperlipidemia: Secondary | ICD-10-CM

## 2019-10-14 NOTE — Progress Notes (Addendum)
Deborah Haney - 54 y.o. female MRN [id number]  Date of birth: 1965/05/24  Office Visit Note: Visit Date: 09/18/2019 PCP: Girtha Rm, NP-C Referred by: Girtha Rm, NP-C  Subjective: Chief Complaint  Patient presents with  . Lower Back - Pain   HPI:  Deborah Haney is a 54 y.o. female who comes in today at the request of Dr. Eduard Roux for planned Right L4-L5 and L5-S1 Lumbar facet/medial branch block with fluoroscopic guidance.  The patient has failed conservative care including home exercise, medications, time and activity modification.  This injection will be diagnostic and hopefully therapeutic.  Please see requesting physician notes for further details and justification.  Exam shows concordant low back pain with facet joint loading and extension.  ROS Otherwise per HPI.  Assessment & Plan: Visit Diagnoses:  1. Lumbar radiculopathy   2. Spondylosis without myelopathy or radiculopathy, lumbar region   3. Chronic right-sided low back pain without sciatica     Plan: No additional findings.   Meds & Orders:  Meds ordered this encounter  Medications  . methylPREDNISolone acetate (DEPO-MEDROL) injection 40 mg    Orders Placed This Encounter  Procedures  . XR C-ARM NO REPORT  . Epidural Steroid injection    Follow-up: Return if symptoms worsen or fail to improve.   Procedures: No procedures performed  Lumbar Diagnostic Facet Joint Nerve Block with Fluoroscopic Guidance   [name]      Date of Birth: [birth year] MRN: [id number] PCP: Girtha Rm, NP-C      Visit Date: 09/18/2019   Universal Protocol:    Date/Time: 08/09/215:23 AM  Consent Given By: the patient  Position: PRONE  Additional Comments: Vital signs were monitored before and after the procedure. Patient was prepped and draped in the usual sterile fashion. The correct patient, procedure, and site was verified.   Injection Procedure Details:  Procedure Site One Meds  Administered:  Meds ordered this encounter  Medications  . methylPREDNISolone acetate (DEPO-MEDROL) injection 40 mg     Laterality: Right  Location/Site:  L4-L5 L5-S1  Needle size: 22 ga.  Needle type:spinal  Needle Placement: Oblique pedical  Findings:   -Comments: There was excellent flow of contrast along the articular pillars without intravascular flow.  Procedure Details: The fluoroscope beam is vertically oriented in AP and then obliqued 15 to 20 degrees to the ipsilateral side of the desired nerve to achieve the "Scotty dog" appearance.  The skin over the target area of the junction of the superior articulating process and the transverse process (sacral ala if blocking the L5 dorsal rami) was locally anesthetized with a 1 ml volume of 1% Lidocaine without Epinephrine.  The spinal needle was inserted and advanced in a trajectory view down to the target.   After contact with periosteum and negative aspirate for blood and CSF, correct placement without intravascular or epidural spread was confirmed by injecting 0.5 ml. of Isovue-250.  A spot radiograph was obtained of this image.    Next, a 0.5 ml. volume of the injectate described above was injected. The needle was then redirected to the other facet joint nerves mentioned above if needed.  Prior to the procedure, the patient was given a Pain Diary which was completed for baseline measurements.  After the procedure, the patient rated their pain every 30 minutes and will continue rating at this frequency for a total of 5 hours.  The patient has been asked to complete the Diary and return  to [place] by mail, fax or hand delivered as soon as possible.   Additional Comments:  The patient tolerated the procedure well Dressing: 2 x 2 sterile gauze and Band-Aid    Post-procedure details: Patient was observed during the procedure. Post-procedure instructions were reviewed.  Patient left the clinic in stable condition.    Clinical  History: MRI LUMBAR SPINE WITHOUT CONTRAST  TECHNIQUE: Multiplanar, multisequence MR imaging of the lumbar spine was performed. No intravenous contrast was administered.  COMPARISON:  None.  FINDINGS: Segmentation: There are 5 non-rib bearing lumbar type vertebral bodies with the last intervertebral disc space labeled as L5-S1.  Alignment:  Normal  Vertebrae: The vertebral body heights are well maintained. No fracture, marrow edema,or pathologic marrow infiltration.  Conus medullaris and cauda equina: Conus extends to the L1 level. Conus and cauda equina appear normal.  Paraspinal and other soft tissues: The paraspinal soft tissues and visualized retroperitoneal structures are unremarkable. The sacroiliac joints are intact.  Disc levels:  T12-L1:  No significant canal or neural foraminal narrowing.  L1-L2:   No significant canal or neural foraminal narrowing.  L2-L3:   No significant canal or neural foraminal narrowing.  L3-L4: There is a broad-based disc bulge with facet arthrosis and ligamentum flavum hypertrophy which causes mild bilateral neural foraminal narrowing and mild effacement anterior thecal sac.  L4-L5: There is a broad-based disc bulge with ligamentum flavum hypertrophy and facet arthrosis which causes moderate left and mild right neural foraminal narrowing.  L5-S1: There is a broad-based disc bulge and facet arthrosis which causes mild bilateral neural foraminal narrowing.  IMPRESSION: Lumbar spine spondylosis most notable at L4-L5 with moderate left and mild right neural foraminal narrowing and mild effacement anterior thecal sac.   Electronically Signed   By: Prudencio Pair M.D.   On: 06/11/2019 13:44     Objective:  VS:  HT:    WT:   BMI:     BP:(!) 143/92  HR:77bpm  TEMP: ( )  RESP:  Physical Exam Constitutional:      General: She is not in acute distress.    Appearance: Normal appearance. She is not ill-appearing.    HENT:     Head: Normocephalic and atraumatic.     Right Ear: External ear normal.     Left Ear: External ear normal.  Eyes:     Extraocular Movements: Extraocular movements intact.  Cardiovascular:     Rate and Rhythm: Normal rate.     Pulses: Normal pulses.  Musculoskeletal:     Right lower leg: No edema.     Left lower leg: No edema.     Comments: Patient has good distal strength with no pain over the greater trochanters.  No clonus or focal weakness. Patient somewhat slow to rise from a seated position to full extension.  There is concordant low back pain with facet loading and lumbar spine extension rotation.  There are no definitive trigger points but the patient is somewhat tender across the lower back and PSIS.  There is no pain with hip rotation.  Skin:    Findings: No erythema, lesion or rash.  Neurological:     General: No focal deficit present.     Mental Status: She is alert and oriented to person, place, and time.     Sensory: No sensory deficit.     Motor: No weakness or abnormal muscle tone.     Coordination: Coordination normal.  Psychiatric:        Mood and Affect:  Mood normal.        Behavior: Behavior normal.      Imaging: No results found.

## 2019-10-14 NOTE — Procedures (Signed)
Lumbar Diagnostic Facet Joint Nerve Block with Fluoroscopic Guidance   [name]      Date of Birth: [birth year] MRN: [id number] PCP: Girtha Rm, NP-C      Visit Date: 09/18/2019   Universal Protocol:    Date/Time: 08/09/215:23 AM  Consent Given By: the patient  Position: PRONE  Additional Comments: Vital signs were monitored before and after the procedure. Patient was prepped and draped in the usual sterile fashion. The correct patient, procedure, and site was verified.   Injection Procedure Details:  Procedure Site One Meds Administered:  Meds ordered this encounter  Medications  . methylPREDNISolone acetate (DEPO-MEDROL) injection 40 mg     Laterality: Right  Location/Site:  L4-L5 L5-S1  Needle size: 22 ga.  Needle type:spinal  Needle Placement: Oblique pedical  Findings:   -Comments: There was excellent flow of contrast along the articular pillars without intravascular flow.  Procedure Details: The fluoroscope beam is vertically oriented in AP and then obliqued 15 to 20 degrees to the ipsilateral side of the desired nerve to achieve the "Scotty dog" appearance.  The skin over the target area of the junction of the superior articulating process and the transverse process (sacral ala if blocking the L5 dorsal rami) was locally anesthetized with a 1 ml volume of 1% Lidocaine without Epinephrine.  The spinal needle was inserted and advanced in a trajectory view down to the target.   After contact with periosteum and negative aspirate for blood and CSF, correct placement without intravascular or epidural spread was confirmed by injecting 0.5 ml. of Isovue-250.  A spot radiograph was obtained of this image.    Next, a 0.5 ml. volume of the injectate described above was injected. The needle was then redirected to the other facet joint nerves mentioned above if needed.  Prior to the procedure, the patient was given a Pain Diary which was completed for  baseline measurements.  After the procedure, the patient rated their pain every 30 minutes and will continue rating at this frequency for a total of 5 hours.  The patient has been asked to complete the Diary and return to [place] by mail, fax or hand delivered as soon as possible.   Additional Comments:  The patient tolerated the procedure well Dressing: 2 x 2 sterile gauze and Band-Aid    Post-procedure details: Patient was observed during the procedure. Post-procedure instructions were reviewed.  Patient left the clinic in stable condition.

## 2019-10-17 NOTE — Progress Notes (Signed)
Subjective:    Patient ID: Deborah Haney, female    DOB: [birth year], 54 y.o.   MRN: [id number]  HPI Chief Complaint  Patient presents with  . fasting cpe    fasting cpe, has hysterectomy    She is here for a complete physical exam and to follow up on chronic health conditions.   Other providers: GI Orthopedist   DM- checks her BS at home. FBS 120s mostly but also in the 140s. Eats a lot of fruit.  Has been diet controlled  Taking a statin and Ace inhibitor Needs DM eye exam  Wants help with weight loss   PT and ortho for back pain   Social history: Lives alone, works for [employer]  Denies smoking, drinking alcohol socially, marijuana edibles occasionally  Diet: low carb diet  Excerise: nothing regular   Immunizations: Appears to be up-to-date  Health maintenance:  Mammogram: 06/2019 Colonoscopy: 11/05/2018 and due for recall in 10 years  Last Gynecological Exam: Hysterectomy  Last Dental Exam: has upcoming appt  Last Eye Exam: will schedule   Wears seatbelt always, smoke detectors in home and functioning, does not text while driving and feels safe in home environment.   Reviewed allergies, medications, past medical, surgical, family, and social history.     Review of Systems Review of Systems Constitutional: -fever, -chills, -sweats, -unexpected weight change,-fatigue ENT: -runny nose, -ear pain, -sore throat Cardiology:  -chest pain, -palpitations, -edema Respiratory: -cough, -shortness of breath, -wheezing Gastroenterology: -abdominal pain, -nausea, -vomiting, -diarrhea, -constipation  Hematology: -bleeding or bruising problems Musculoskeletal: -arthralgias, -myalgias, -joint swelling, -back pain Ophthalmology: -vision changes Urology: -dysuria, -difficulty urinating, -hematuria, -urinary frequency, -urgency Neurology: -headache, -weakness, -tingling, -numbness       Objective:   Physical Exam BP 120/80   Pulse 66   Ht 5\' 6"  (1.676 m)    Wt 204 lb 3.2 oz (92.6 kg)   LMP 04/25/2011   BMI 32.96 kg/m   General Appearance:    Alert, cooperative, no distress, appears stated age  Head:    Normocephalic, without obvious abnormality, atraumatic  Eyes:    PERRL, conjunctiva/corneas clear, EOM's intact  Ears:    Normal TM's and external ear canals  Nose:  Mask in place  Throat:  Mask in place  Neck:   Supple, no lymphadenopathy;  thyroid:  no   enlargement/tenderness/nodules; no JVD  Back:    Spine nontender, no curvature, ROM normal, no CVA     tenderness  Lungs:     Clear to auscultation bilaterally without wheezes, rales or     ronchi; respirations unlabored  Chest Wall:    No tenderness or deformity   Heart:    Regular rate and rhythm, S1 and S2 normal, no murmur, rub   or gallop  Breast Exam:   Declines  Abdomen:     Soft, non-tender, nondistended, normoactive bowel sounds,    no masses, no hepatosplenomegaly  Genitalia:   Declines     Extremities:   No clubbing, cyanosis or edema  Pulses:   2+ and symmetric all extremities  Skin:   Skin color, texture, turgor normal, no rashes or lesions  Lymph nodes:   Cervical, supraclavicular, and axillary nodes normal  Neurologic:   CNII-XII intact, normal strength, sensation and gait; reflexes 2+ and symmetric throughout          Psych:   Normal mood, affect, hygiene and grooming.         Assessment & Plan:  Routine  general medical examination at a health care facility - Plan: CBC with Differential/Platelet, Comprehensive metabolic panel, POCT Urinalysis DIP (Proadvantage Device), TSH, T4, free -Preventive health care reviewed.  Immunizations reviewed.  Counseling on healthy lifestyle including diet and exercise.  Discussed safety and health promotion.  Essential hypertension - Plan: CBC with Differential/Platelet, Comprehensive metabolic panel -Blood pressure well controlled.  Continue current medication and low-sodium diet.  Controlled type 2 diabetes mellitus without  complication, without long-term current use of insulin ([place]) - Plan: HgB A1c, Lipid panel -Hemoglobin A1c is 6.8% today and elevated.  I recommend starting on Ozempic once weekly for diabetes and she desires weight loss.  Discussed potential side effects of Ozempic.  Counseling on low sugar, low-carb diet and increasing physical activity.  Elevated LDL cholesterol level -Continue statin therapy.  Follow-up pending lipid panel  Obesity (BMI 30.0-34.9) - Plan: CBC with Differential/Platelet, Comprehensive metabolic panel, TSH, T4, free, Lipid panel -Counseled on healthy diet and exercise for weight loss.  We will also start Ozempic for her diabetes and will hopefully see weight loss

## 2019-10-17 NOTE — Patient Instructions (Addendum)
Hemoglobin A1c 6.8% Start Ozempic once weekly.  Let me know if you have any concerns.       Preventive Care 31-52 Years Old, Female Preventive care refers to visits with your health care provider and lifestyle choices that can promote health and wellness. This includes:  A yearly physical exam. This may also be called an annual well check.  Regular dental visits and eye exams.  Immunizations.  Screening for certain conditions.  Healthy lifestyle choices, such as eating a healthy diet, getting regular exercise, not using drugs or products that contain nicotine and tobacco, and limiting alcohol use. What can I expect for my preventive care visit? Physical exam Your health care provider will check your:  Height and weight. This may be used to calculate body mass index (BMI), which tells if you are at a healthy weight.  Heart rate and blood pressure.  Skin for abnormal spots. Counseling Your health care provider may ask you questions about your:  Alcohol, tobacco, and drug use.  Emotional well-being.  Home and relationship well-being.  Sexual activity.  Eating habits.  Work and work Statistician.  Method of birth control.  Menstrual cycle.  Pregnancy history. What immunizations do I need?  Influenza (flu) vaccine  This is recommended every year. Tetanus, diphtheria, and pertussis (Tdap) vaccine  You may need a Td booster every 10 years. Varicella (chickenpox) vaccine  You may need this if you have not been vaccinated. Zoster (shingles) vaccine  You may need this after age 84. Measles, mumps, and rubella (MMR) vaccine  You may need at least one dose of MMR if you were born in 1957 or later. You may also need a second dose. Pneumococcal conjugate (PCV13) vaccine  You may need this if you have certain conditions and were not previously vaccinated. Pneumococcal polysaccharide (PPSV23) vaccine  You may need one or two doses if you smoke cigarettes or if  you have certain conditions. Meningococcal conjugate (MenACWY) vaccine  You may need this if you have certain conditions. Hepatitis A vaccine  You may need this if you have certain conditions or if you travel or work in places where you may be exposed to hepatitis A. Hepatitis B vaccine  You may need this if you have certain conditions or if you travel or work in places where you may be exposed to hepatitis B. Haemophilus influenzae type b (Hib) vaccine  You may need this if you have certain conditions. Human papillomavirus (HPV) vaccine  If recommended by your health care provider, you may need three doses over 6 months. You may receive vaccines as individual doses or as more than one vaccine together in one shot (combination vaccines). Talk with your health care provider about the risks and benefits of combination vaccines. What tests do I need? Blood tests  Lipid and cholesterol levels. These may be checked every 5 years, or more frequently if you are over 45 years old.  Hepatitis C test.  Hepatitis B test. Screening  Lung cancer screening. You may have this screening every year starting at age 5 if you have a 30-pack-year history of smoking and currently smoke or have quit within the past 15 years.  Colorectal cancer screening. All adults should have this screening starting at age 3 and continuing until age 55. Your health care provider may recommend screening at age 38 if you are at increased risk. You will have tests every 1-10 years, depending on your results and the type of screening test.  Diabetes screening.  This is done by checking your blood sugar (glucose) after you have not eaten for a while (fasting). You may have this done every 1-3 years.  Mammogram. This may be done every 1-2 years. Talk with your health care provider about when you should start having regular mammograms. This may depend on whether you have a family history of breast cancer.  BRCA-related cancer  screening. This may be done if you have a family history of breast, ovarian, tubal, or peritoneal cancers.  Pelvic exam and Pap test. This may be done every 3 years starting at age 28. Starting at age 46, this may be done every 5 years if you have a Pap test in combination with an HPV test. Other tests  Sexually transmitted disease (STD) testing.  Bone density scan. This is done to screen for osteoporosis. You may have this scan if you are at high risk for osteoporosis. Follow these instructions at home: Eating and drinking  Eat a diet that includes fresh fruits and vegetables, whole grains, lean protein, and low-fat dairy.  Take vitamin and mineral supplements as recommended by your health care provider.  Do not drink alcohol if: ? Your health care provider tells you not to drink. ? You are pregnant, may be pregnant, or are planning to become pregnant.  If you drink alcohol: ? Limit how much you have to 0-1 drink a day. ? Be aware of how much alcohol is in your drink. In the [place] one drink equals one 12 oz bottle of beer (355 mL), one 5 oz glass of wine (148 mL), or one 1 oz glass of hard liquor (44 mL). Lifestyle  Take daily care of your teeth and gums.  Stay active. Exercise for at least 30 minutes on 5 or more days each week.  Do not use any products that contain nicotine or tobacco, such as cigarettes, e-cigarettes, and chewing tobacco. If you need help quitting, ask your health care provider.  If you are sexually active, practice safe sex. Use a condom or other form of birth control (contraception) in order to prevent pregnancy and STIs (sexually transmitted infections).  If told by your health care provider, take low-dose aspirin daily starting at age 10. What's next?  Visit your health care provider once a year for a well check visit.  Ask your health care provider how often you should have your eyes and teeth checked.  Stay up to date on all vaccines. This  information is not intended to replace advice given to you by your health care provider. Make sure you discuss any questions you have with your health care provider. Document Revised: 11/02/2017 Document Reviewed: 11/02/2017 Elsevier Patient Education  2020 [employer].

## 2019-10-18 ENCOUNTER — Other Ambulatory Visit: Payer: Self-pay

## 2019-10-18 ENCOUNTER — Ambulatory Visit: Payer: BC Managed Care – PPO | Admitting: Family Medicine

## 2019-10-18 ENCOUNTER — Encounter: Payer: Self-pay | Admitting: Family Medicine

## 2019-10-18 VITALS — BP 120/80 | HR 66 | Ht 66.0 in | Wt 204.2 lb

## 2019-10-18 DIAGNOSIS — E7800 Pure hypercholesterolemia, unspecified: Secondary | ICD-10-CM

## 2019-10-18 DIAGNOSIS — E119 Type 2 diabetes mellitus without complications: Secondary | ICD-10-CM | POA: Diagnosis not present

## 2019-10-18 DIAGNOSIS — E669 Obesity, unspecified: Secondary | ICD-10-CM | POA: Diagnosis not present

## 2019-10-18 DIAGNOSIS — Z0000 Encounter for general adult medical examination without abnormal findings: Secondary | ICD-10-CM | POA: Diagnosis not present

## 2019-10-18 DIAGNOSIS — I10 Essential (primary) hypertension: Secondary | ICD-10-CM

## 2019-10-18 LAB — POCT URINALYSIS DIP (PROADVANTAGE DEVICE)
Bilirubin, UA: NEGATIVE
Blood, UA: NEGATIVE
Glucose, UA: NEGATIVE mg/dL
Ketones, POC UA: NEGATIVE mg/dL
Leukocytes, UA: NEGATIVE
Nitrite, UA: NEGATIVE
Protein Ur, POC: NEGATIVE mg/dL
Specific Gravity, Urine: 1.025
Urobilinogen, Ur: NEGATIVE
pH, UA: 6 (ref 5.0–8.0)

## 2019-10-18 LAB — POCT GLYCOSYLATED HEMOGLOBIN (HGB A1C): Hemoglobin A1C: 6.8 % — AB (ref 4.0–5.6)

## 2019-10-18 MED ORDER — OZEMPIC (0.25 OR 0.5 MG/DOSE) 2 MG/1.5ML ~~LOC~~ SOPN: PEN_INJECTOR | SUBCUTANEOUS | 2 refills | Status: DC

## 2019-10-19 LAB — TSH: TSH: 1.27 u[IU]/mL (ref 0.450–4.500)

## 2019-10-19 LAB — LIPID PANEL
Chol/HDL Ratio: 3.3 ratio (ref 0.0–4.4)
Cholesterol, Total: 142 mg/dL (ref 100–199)
HDL: 43 mg/dL (ref >39)
LDL Chol Calc (NIH): 78 mg/dL (ref 0–99)
Triglycerides: 119 mg/dL (ref 0–149)
VLDL Cholesterol Cal: 21 mg/dL (ref 5–40)

## 2019-10-19 LAB — COMPREHENSIVE METABOLIC PANEL
ALT: 28 IU/L (ref 0–32)
AST: 18 IU/L (ref 0–40)
Albumin/Globulin Ratio: 1.3 (ref 1.2–2.2)
Albumin: 4.3 g/dL (ref 3.8–4.9)
Alkaline Phosphatase: 58 IU/L (ref 48–121)
BUN/Creatinine Ratio: 12 (ref 9–23)
BUN: 10 mg/dL (ref 6–24)
Bilirubin Total: 0.5 mg/dL (ref 0.0–1.2)
CO2: 23 mmol/L (ref 20–29)
Calcium: 9.4 mg/dL (ref 8.7–10.2)
Chloride: 103 mmol/L (ref 96–106)
Creatinine, Ser: 0.86 mg/dL (ref 0.57–1.00)
GFR calc Af Amer: 90 mL/min/{1.73_m2} (ref >59)
GFR calc non Af Amer: 78 mL/min/{1.73_m2} (ref >59)
Globulin, Total: 3.4 g/dL (ref 1.5–4.5)
Glucose: 116 mg/dL — ABNORMAL HIGH (ref 65–99)
Potassium: 4.6 mmol/L (ref 3.5–5.2)
Sodium: 140 mmol/L (ref 134–144)
Total Protein: 7.7 g/dL (ref 6.0–8.5)

## 2019-10-19 LAB — CBC WITH DIFFERENTIAL/PLATELET
Basophils Absolute: 0.1 10*3/uL (ref 0.0–0.2)
Basos: 1 %
EOS (ABSOLUTE): 0.2 10*3/uL (ref 0.0–0.4)
Eos: 4 %
Hematocrit: 37 % (ref 34.0–46.6)
Hemoglobin: 12.5 g/dL (ref 11.1–15.9)
Immature Grans (Abs): 0 10*3/uL (ref 0.0–0.1)
Immature Granulocytes: 0 %
Lymphocytes Absolute: 2.3 10*3/uL (ref 0.7–3.1)
Lymphs: 43 %
MCH: 29.8 pg (ref 26.6–33.0)
MCHC: 33.8 g/dL (ref 31.5–35.7)
MCV: 88 fL (ref 79–97)
Monocytes Absolute: 0.3 10*3/uL (ref 0.1–0.9)
Monocytes: 6 %
Neutrophils Absolute: 2.4 10*3/uL (ref 1.4–7.0)
Neutrophils: 46 %
Platelets: 262 10*3/uL (ref 150–450)
RBC: 4.19 x10E6/uL (ref 3.77–5.28)
RDW: 14.5 % (ref 11.7–15.4)
WBC: 5.3 10*3/uL (ref 3.4–10.8)

## 2019-10-19 LAB — T4, FREE: Free T4: 1.2 ng/dL (ref 0.82–1.77)

## 2019-10-20 ENCOUNTER — Other Ambulatory Visit: Payer: Self-pay | Admitting: Family Medicine

## 2019-10-21 ENCOUNTER — Other Ambulatory Visit: Payer: Self-pay | Admitting: Family Medicine

## 2019-10-21 DIAGNOSIS — E782 Mixed hyperlipidemia: Secondary | ICD-10-CM

## 2019-10-21 DIAGNOSIS — E119 Type 2 diabetes mellitus without complications: Secondary | ICD-10-CM

## 2019-10-21 DIAGNOSIS — I10 Essential (primary) hypertension: Secondary | ICD-10-CM

## 2019-10-21 NOTE — Telephone Encounter (Signed)
This requires a PA. Pt has been given a month half of samples since just starting on low dose

## 2019-10-21 NOTE — Telephone Encounter (Signed)
Let's see if we can get a P.A. thanks.

## 2019-10-25 ENCOUNTER — Telehealth: Payer: Self-pay | Admitting: Family Medicine

## 2019-10-25 NOTE — Telephone Encounter (Signed)
P.A. OZEMPIC 

## 2019-10-28 NOTE — Telephone Encounter (Signed)
P.A. approved, called pharmacy & went thru for $24.99, called pt informed

## 2019-10-30 ENCOUNTER — Encounter: Payer: BC Managed Care – PPO | Admitting: Internal Medicine

## 2019-11-02 ENCOUNTER — Ambulatory Visit (HOSPITAL_COMMUNITY): Payer: BC Managed Care – PPO

## 2019-11-02 ENCOUNTER — Encounter (HOSPITAL_COMMUNITY): Payer: Self-pay

## 2019-11-02 ENCOUNTER — Ambulatory Visit (HOSPITAL_COMMUNITY)
Admission: EM | Admit: 2019-11-02 | Discharge: 2019-11-02 | Disposition: A | Discharge status: Home / Self Care | Payer: BC Managed Care – PPO | Attending: Emergency Medicine | Admitting: Emergency Medicine

## 2019-11-02 ENCOUNTER — Ambulatory Visit (INDEPENDENT_AMBULATORY_CARE_PROVIDER_SITE_OTHER): Payer: BC Managed Care – PPO

## 2019-11-02 ENCOUNTER — Other Ambulatory Visit: Payer: Self-pay

## 2019-11-02 DIAGNOSIS — M25512 Pain in left shoulder: Secondary | ICD-10-CM

## 2019-11-02 DIAGNOSIS — M25812 Other specified joint disorders, left shoulder: Secondary | ICD-10-CM | POA: Diagnosis not present

## 2019-11-02 NOTE — Discharge Instructions (Addendum)
Your x-ray is normal.  Tylenol or ibuprofen as needed for discomfort.    Follow-up with an orthopedist such as the one listed below if your symptoms are not improving.   Go to the emergency department if you have acute worsening symptoms.

## 2019-11-02 NOTE — ED Provider Notes (Signed)
[facility]    CSN: [id number] Arrival date & time: 11/02/19  1640      History   Chief Complaint Chief Complaint  Patient presents with  . Arm Pain    Left     HPI Deborah Haney is a 54 y.o. female.   Patient presents with pain in her left shoulder, radiating down her left arm.  She also reports numbness and tingling in her left hand.  No known injury.  She states the pain started this morning when she woke up, currently 9/10, constant, aching.  No treatments attempted at home.  She denies chest pain, shortness of breath, abdominal pain, rash, erythema, bruising, or other symptoms.  Patient's medical history includes diabetes, hypertension, anxiety, depression, bipolar, obesity.  The history is provided by the patient.    Past Medical History:  Diagnosis Date  . Acute cholecystitis 07/14/2013  . Anemia    has history, takes iron  . Anxiety   . Bipolar affective disorder ([place]) 10/26/2015   Has seen counselor, declined medications per medical record from [facility]. hypermanic  . Chest pain 07/14/2013  . Chronic vaginitis 10/26/2015   Has h/o vag dryness and tried Estrace per [place] records  . COVID-19 virus infection 08/24/2018  . Depression   . Diabetes [facility])    Patient denies  . Diverticulitis    no problems  . Dysfunctional uterine bleeding 03/02/2011  . Elevated hemoglobin A1c 10/26/2015  . GERD (gastroesophageal reflux disease)    tums prn  . Headache(784.0)   . Hemorrhoids   . HTN (hypertension)    BP meds since fall 2016  . Internal hemorrhoids   . Nausea & vomiting 07/14/2013  . Obesity 10/26/2015  . Rectal bleeding 03/15/2017    Patient Active Problem List   Diagnosis Date Noted  . Burning with urination 09/25/2019  . Frequent urination 09/25/2019  . Gross hematuria 09/25/2019  . Internal bleeding hemorrhoids 08/30/2019  . Change in bowel habits 08/30/2019  . Loose stools 08/30/2019  . Paresthesia of both lower extremities 11/14/2018  . Elevated  LDL cholesterol level 10/17/2018  . Proteinuria 10/17/2018  . History of 2019 novel coronavirus disease (COVID-19) 09/24/2018  . History of pneumonia 09/24/2018  . Controlled type 2 diabetes mellitus without complication, without long-term current use of insulin ([place]) 09/11/2018  . Acute on chronic respiratory failure with hypoxia ([place]) 09/05/2018  . Prediabetes 08/31/2018  . Hypokalemia 08/31/2018  . Allergic rhinitis due to pollen 06/12/2018  . Dyslipidemia 06/05/2017  . Diabetes ([place])   . Hemorrhoids 03/15/2017  . Rectal bleeding 03/15/2017  . Essential hypertension 10/26/2015  . Obesity (BMI 30.0-34.9) 10/26/2015  . Chronic vaginitis 10/26/2015  . Bipolar affective disorder ([place]) 10/26/2015  . Chest pain 07/14/2013  . Dysfunctional uterine bleeding 03/02/2011    Past Surgical History:  Procedure Laterality Date  . CHOLECYSTECTOMY N/A 07/15/2013   Procedure: LAPAROSCOPIC CHOLECYSTECTOMY;  Surgeon: Rolm Bookbinder, MD;  Location: [place];  Service: General;  Laterality: N/A;  . COLONOSCOPY    . ECTOPIC PREGNANCY SURGERY     laparotomy  . HEMORRHOID BANDING    . ROBOTIC ASSISTED LAP VAGINAL HYSTERECTOMY      OB History    Gravida  1   Para  0   Term  0   Preterm  0   AB  1   Living  0     SAB  0   TAB  0   Ectopic  1   Multiple  0  Live Births               Home Medications    Prior to Admission medications   Medication Sig Start Date End Date Taking? Authorizing Provider  atorvastatin (LIPITOR) 10 MG tablet Take 1 tablet by mouth once daily 10/21/19   Henson, Vickie L, NP-C  Blood Glucose Monitoring Suppl (CONTOUR MONITOR) w/Device KIT 1 kit by Does not apply route 2 (two) times a day. 07/02/18   Henson, Vickie L, NP-C  glucose blood (CONTOUR NEXT TEST) test strip Test twice a day 07/02/18   Harland Dingwall L, NP-C  ibuprofen (ADVIL) 800 MG tablet Take 800 mg by mouth every 6 (six) hours as needed. 05/01/19   [provider]    lisinopril-hydrochlorothiazide (ZESTORETIC) 10-12.5 MG tablet Take 1 tablet by mouth once daily 10/21/19   Henson, Vickie L, NP-C  methocarbamol (ROBAXIN) 500 MG tablet Take 1 tablet (500 mg total) by mouth 2 (two) times daily as needed. 04/23/19   Aundra Dubin, PA-C  Microlet Lancets MISC Test blood sugar twice a day 07/02/18   Harland Dingwall L, NP-C  Multiple Vitamin (MULTIVITAMIN) tablet Take 1 tablet by mouth daily. Patient not taking: Reported on 10/18/2019    [provider]  mupirocin ointment (BACTROBAN) 2 % Apply thin layer to affected area 3 times daily x 7 days. Patient not taking: Reported on 09/25/2019 01/29/18   Harland Dingwall L, NP-C  Semaglutide,0.25 or 0.5MG/DOS, (OZEMPIC, 0.25 OR 0.5 MG/DOSE,) 2 MG/1.5ML SOPN Inject 0.30m weekly x 1 month, then increase 0.567mweekly x 1 month then increase to 28m23meekly 10/18/19   HenGirtha RmP-C    Family History Family History  Problem Relation Age of Onset  . Diabetes Mother   . Hypertension Mother   . Arthritis Father 78 67 Hypertension Brother   . Cancer Brother        blood   . Cancer Brother        unsure kind, as a child   . CVA Other   . Colon cancer Neg Hx   . Colon polyps Neg Hx   . Esophageal cancer Neg Hx   . Rectal cancer Neg Hx   . Stomach cancer Neg Hx   . Pancreatic cancer Neg Hx     Social History Social History   Tobacco Use  . Smoking status: Former Smoker    Types: Cigarettes    Quit date: 04/18/2008    Years since quitting: 11.5  . Smokeless tobacco: Never Used  Vaping Use  . Vaping Use: Never used  Substance Use Topics  . Alcohol use: Yes    Comment: 1 glass of wine rarely, occasionally  . Drug use: No    Types: Cocaine    Comment: Former cocaine use since 2012     Allergies   Patient has no known allergies.   Review of Systems Review of Systems  Constitutional: Negative for chills and fever.  HENT: Negative for ear pain and sore throat.   Eyes: Negative for pain and  visual disturbance.  Respiratory: Negative for cough and shortness of breath.   Cardiovascular: Negative for chest pain and palpitations.  Gastrointestinal: Negative for abdominal pain and vomiting.  Genitourinary: Negative for dysuria and hematuria.  Musculoskeletal: Positive for arthralgias. Negative for back pain.  Skin: Negative for color change and rash.  Neurological: Positive for numbness. Negative for dizziness, seizures, syncope, facial asymmetry, speech difficulty, weakness and headaches.  All other systems reviewed and  are negative.    Physical Exam Triage Vital Signs ED Triage Vitals [11/02/19 1650]  Enc Vitals Group     BP (!) 143/83     Pulse Rate 78     Resp 16     Temp 98.2 F (36.8 C)     Temp Source Oral     SpO2 100 %     Weight      Height      Head Circumference      Peak Flow      Pain Score 9     Pain Loc      Pain Edu?      Excl. in [place]?    No data found.  Updated Vital Signs BP (!) 143/83 (BP Location: Right Arm)   Pulse 78   Temp 98.2 F (36.8 C) (Oral)   Resp 16   LMP 04/25/2011   SpO2 100%   Visual Acuity Right Eye Distance:   Left Eye Distance:   Bilateral Distance:    Right Eye Near:   Left Eye Near:    Bilateral Near:     Physical Exam Vitals and nursing note reviewed.  Constitutional:      General: She is not in acute distress.    Appearance: She is well-developed.  HENT:     Head: Normocephalic and atraumatic.  Eyes:     Conjunctiva/sclera: Conjunctivae normal.  Cardiovascular:     Rate and Rhythm: Normal rate and regular rhythm.     Heart sounds: No murmur heard.   Pulmonary:     Effort: Pulmonary effort is normal. No respiratory distress.     Breath sounds: Normal breath sounds.  Abdominal:     Palpations: Abdomen is soft.     Tenderness: There is no abdominal tenderness.  Musculoskeletal:        General: Tenderness present. No swelling or deformity.     Cervical back: Neck supple.     Comments: Left shoulder  tenderness and limited ROM.   Skin:    General: Skin is warm and dry.     Capillary Refill: Capillary refill takes less than 2 seconds.     Findings: No bruising, erythema, lesion or rash.  Neurological:     General: No focal deficit present.     Mental Status: She is alert and oriented to person, place, and time.     Sensory: No sensory deficit.     Motor: Weakness present.     Gait: Gait normal.  Psychiatric:        Mood and Affect: Mood normal.        Behavior: Behavior normal.      UC Treatments / Results  Labs (all labs ordered are listed, but only abnormal results are displayed) Labs Reviewed - No data to display  EKG   Radiology DG Shoulder Left  Result Date: 11/02/2019 CLINICAL DATA:  Awoke with left shoulder pain. Decreased range of motion. EXAM: LEFT SHOULDER - 2+ VIEW COMPARISON:  None. FINDINGS: There is no evidence of fracture or dislocation. No erosion, bony destruction, or evidence of focal lesion. Small soft tissue calcification adjacent to the lateral humeral head in the region of the rotator cuff insertion. Similar appearing soft tissue calcification is just above the glenoid. IMPRESSION: Small soft tissue calcifications about the shoulder which may be calcific tendinopathy. No acute osseous abnormality. Electronically Signed   By: Keith Rake M.D.   On: 11/02/2019 18:34    Procedures Procedures (including critical care time)  Medications Ordered in UC Medications - No data to display  Initial Impression / Assessment and Plan / UC Course  I have reviewed the triage vital signs and the nursing notes.  Pertinent labs & imaging results that were available during my care of the patient were reviewed by me and considered in my medical decision making (see chart for details).   Acute pain of the left shoulder.  X-ray negative.  Instructed patient to take Tylenol or ibuprofen as needed.  Instructed her to rest her shoulder until she is evaluated by  orthopedics.  Instructed her to follow-up with orthopedics on Monday.  Instructed her to go to the ED if she has acute worsening symptoms.  Patient agrees to plan of care.   Final Clinical Impressions(s) / UC Diagnoses   Final diagnoses:  Acute pain of left shoulder     Discharge Instructions     Your x-ray is normal.  Tylenol or ibuprofen as needed for discomfort.    Follow-up with an orthopedist such as the one listed below if your symptoms are not improving.   Go to the emergency department if you have acute worsening symptoms.        ED Prescriptions    None     PDMP not reviewed this encounter.   Sharion Balloon, NP 11/02/19 1857

## 2019-11-02 NOTE — ED Triage Notes (Signed)
Pt present left arm pain, when she woke up this am she was unable to lift her arm.

## 2019-11-05 ENCOUNTER — Encounter: Payer: Self-pay | Admitting: Family Medicine

## 2019-11-07 ENCOUNTER — Ambulatory Visit: Payer: BC Managed Care – PPO | Admitting: Orthopaedic Surgery

## 2019-11-07 ENCOUNTER — Encounter: Payer: Self-pay | Admitting: Internal Medicine

## 2019-11-07 ENCOUNTER — Encounter: Payer: Self-pay | Admitting: Orthopaedic Surgery

## 2019-11-07 ENCOUNTER — Ambulatory Visit: Payer: BC Managed Care – PPO | Admitting: Internal Medicine

## 2019-11-07 ENCOUNTER — Encounter: Payer: Self-pay | Admitting: Family Medicine

## 2019-11-07 ENCOUNTER — Other Ambulatory Visit: Payer: Self-pay

## 2019-11-07 ENCOUNTER — Ambulatory Visit (INDEPENDENT_AMBULATORY_CARE_PROVIDER_SITE_OTHER): Payer: BC Managed Care – PPO | Admitting: Family Medicine

## 2019-11-07 VITALS — BP 130/80 | HR 72 | Ht 66.0 in | Wt 201.0 lb

## 2019-11-07 VITALS — BP 120/70 | HR 83 | Temp 98.3°F | Wt 202.6 lb

## 2019-11-07 VITALS — Ht 66.0 in | Wt 200.2 lb

## 2019-11-07 DIAGNOSIS — M7532 Calcific tendinitis of left shoulder: Secondary | ICD-10-CM | POA: Diagnosis not present

## 2019-11-07 DIAGNOSIS — B373 Candidiasis of vulva and vagina: Secondary | ICD-10-CM

## 2019-11-07 DIAGNOSIS — N760 Acute vaginitis: Secondary | ICD-10-CM | POA: Diagnosis not present

## 2019-11-07 DIAGNOSIS — Z202 Contact with and (suspected) exposure to infections with a predominantly sexual mode of transmission: Secondary | ICD-10-CM | POA: Diagnosis not present

## 2019-11-07 DIAGNOSIS — K648 Other hemorrhoids: Secondary | ICD-10-CM | POA: Diagnosis not present

## 2019-11-07 DIAGNOSIS — E669 Obesity, unspecified: Secondary | ICD-10-CM

## 2019-11-07 DIAGNOSIS — N898 Other specified noninflammatory disorders of vagina: Secondary | ICD-10-CM | POA: Diagnosis not present

## 2019-11-07 DIAGNOSIS — B3731 Acute candidiasis of vulva and vagina: Secondary | ICD-10-CM

## 2019-11-07 DIAGNOSIS — B9689 Other specified bacterial agents as the cause of diseases classified elsewhere: Secondary | ICD-10-CM | POA: Diagnosis not present

## 2019-11-07 LAB — POCT WET PREP (WET MOUNT)
Clue Cells Wet Prep Whiff POC: POSITIVE
Trichomonas Wet Prep HPF POC: ABSENT

## 2019-11-07 MED ORDER — LIDOCAINE HCL 2 % IJ SOLN
2.0000 mL | INTRAMUSCULAR | Status: AC | PRN
  Administered 2019-11-07: 2 mL

## 2019-11-07 MED ORDER — METHYLPREDNISOLONE ACETATE 40 MG/ML IJ SUSP
40.0000 mg | INTRAMUSCULAR | Status: AC | PRN
  Administered 2019-11-07: 40 mg via INTRA_ARTICULAR

## 2019-11-07 MED ORDER — METRONIDAZOLE 500 MG PO TABS: 500.0000 mg | ORAL_TABLET | Freq: Two times a day (BID) | ORAL | 0 refills | Status: DC

## 2019-11-07 MED ORDER — FLUCONAZOLE 150 MG PO TABS: 150.0000 mg | ORAL_TABLET | Freq: Once | ORAL | 0 refills | Status: AC

## 2019-11-07 MED ORDER — BUPIVACAINE HCL 0.25 % IJ SOLN
2.0000 mL | INTRAMUSCULAR | Status: AC | PRN
  Administered 2019-11-07: 2 mL via INTRA_ARTICULAR

## 2019-11-07 NOTE — Assessment & Plan Note (Addendum)
Right posterior column of internal hemorrhoids banded again today return in several weeks for reassessment and continued banding as needed I anticipate that she will benefit from banding of the other columns of hemorrhoids as well. Avoid constipation and straining fiber, MiraLAX as needed

## 2019-11-07 NOTE — Progress Notes (Signed)
   The patient presents with complaints of recurrent rectal bleeding. She has had hemorrhoidal banding on 3 occasions in the past in 2019 and more recently in June 2021 I banded her right posterior internal hemorrhoid. She had a good period of time without bleeding but she started Ozempic recently and has been a little constipated and the bleeding has returned and she wanted to be reevaluated and considered for repeat banding.    Rectal exam with female staff present demonstrates a slight bulging internal hemorrhoid complex visible at the anus in the right posterior position, digital exam is negative otherwise. Anoscopic exam was performed and demonstrates grade 2 internal hemorrhoids in all positions with the right posterior being more inflamed and a bit larger than the others.  PROCEDURE NOTE: The patient presents with symptomatic grade 2  hemorrhoids, requesting rubber band ligation of his/her hemorrhoidal disease.  All risks, benefits and alternative forms of therapy were described and informed consent was obtained.   The anorectum was pre-medicated with 0.125% nitroglycerin and 5% lidocaine the decision was made to band the RP internal hemorrhoid, and the Arial was used to perform band ligation without complication.  Digital anorectal examination was then performed to assure proper positioning of the band, and to adjust the banded tissue as required.  The patient was discharged home without pain or other issues.  Dietary and behavioral recommendations were given and along with follow-up instructions.     The following adjunctive treatments were recommended:  MiraLAX and fiber as needed  The patient will return in several weeks, early October for  follow-up and possible additional banding as required. No complications were encountered and the patient tolerated the procedure well.  I emphasized the need for regular follow-up to achieve the most effective symptom  relief.   Additionally we had a conversation about obesity type 2 diabetes and weight loss and she was given information regarding low-carb eating and potential reversal of type 2 diabetes mellitus through weight loss, specifically information from [email or web address]. I think this may benefit her if she can follow through and change in eating habits.  Internal bleeding hemorrhoids Right posterior column of internal hemorrhoids banded again today return in several weeks for reassessment and continued banding as needed I anticipate that she will benefit from banding of the other columns of hemorrhoids as well. Avoid constipation and straining fiber, MiraLAX as needed  Obesity (BMI 30.0-34.9) Recommended she explore low-carb/intermittent fasting [email or web address]     I appreciate the opportunity to care for this patient. CC: Henson, Vickie L, NP-C

## 2019-11-07 NOTE — Assessment & Plan Note (Signed)
Recommended she explore low-carb/intermittent fasting [email or web address]

## 2019-11-07 NOTE — Patient Instructions (Addendum)
HEMORRHOID BANDING PROCEDURE    FOLLOW-UP CARE   1. The procedure you have had should have been relatively painless since the banding of the area involved does not have nerve endings and there is no pain sensation.  The rubber band cuts off the blood supply to the hemorrhoid and the band may fall off as soon as 48 hours after the banding (the band may occasionally be seen in the toilet bowl following a bowel movement). You may notice a temporary feeling of fullness in the rectum which should respond adequately to plain Tylenol or Motrin.  2. Following the banding, avoid strenuous exercise that evening and resume full activity the next day.  A sitz bath (soaking in a warm tub) or bidet is soothing, and can be useful for cleansing the area after bowel movements.     3. To avoid constipation, take two tablespoons of natural wheat bran, natural oat bran, flax, Benefiber or any over the counter fiber supplement and increase your water intake to 7-8 glasses daily.    4. Unless you have been prescribed anorectal medication, do not put anything inside your rectum for two weeks: No suppositories, enemas, fingers, etc.  5. Occasionally, you may have more bleeding than usual after the banding procedure.  This is often from the untreated hemorrhoids rather than the treated one.  Don't be concerned if there is a tablespoon or so of blood.  If there is more blood than this, lie flat with your bottom higher than your head and apply an ice pack to the area. If the bleeding does not stop within a half an hour or if you feel faint, call our office at [phone] or go to the emergency room.  6. Problems are not common; however, if there is a substantial amount of bleeding, severe pain, chills, fever or difficulty passing urine (very rare) or other problems, you should call us at [phone] or report to the nearest emergency room.  7. Do not stay seated continuously for more than 2-3 hours for a day or two  after the procedure.  Tighten your buttock muscles 10-15 times every two hours and take 10-15 deep breaths every 1-2 hours.  Do not spend more than a few minutes on the toilet if you cannot empty your bowel; instead re-visit the toilet at a later time.    We will see you back October 7th at 2:50pm for additional banding.  We have given you diet information to read.   I appreciate the opportunity to care for you. Silvano Rusk, MD, [facility]

## 2019-11-07 NOTE — Patient Instructions (Signed)
Avoid alcohol with the antibiotic.

## 2019-11-07 NOTE — Progress Notes (Signed)
Office Visit Note   [name]           Date of Birth: 02/05/1966           MRN: [id number] Visit Date: 11/07/2019              Requested by: Girtha Rm, NP-C [place],  [place] [id number] PCP: Girtha Rm, NP-C   Assessment & Plan: Visit Diagnoses:  1. Calcific tendinitis of left shoulder     Plan: Impression is left shoulder calcific rotator cuff tendinitis.  We will inject the subacromial space with cortisone today.  She will follow-up with [place] as needed.  Follow-Up Instructions: Return if symptoms worsen or fail to improve.   Orders:  Orders Placed This Encounter  Procedures  . Large Joint Inj: L subacromial bursa   No orders of the defined types were placed in this encounter.     Procedures: Large Joint Inj: L subacromial bursa on 11/07/2019 9:00 AM Indications: pain Details: 22 G needle Medications: 2 mL lidocaine 2 %; 2 mL bupivacaine 0.25 %; 40 mg methylPREDNISolone acetate 40 MG/ML Outcome: tolerated well, no immediate complications Patient was prepped and draped in the usual sterile fashion.       Clinical Data: No additional findings.   Subjective: Chief Complaint  Patient presents with  . Left Upper Arm - Pain    HPI patient is a pleasant 54 year old female who comes in today with left shoulder pain.  She is noted intermittent shoulder pain for a while but this was really not too noticeable until about a week ago when it woke her from her sleep.  She has had moderate pain to the top of the shoulder and deltoid area since.  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  The pain is aggravated with carrying her backpack as well as with forward flexion, abduction and internal rotation of the shoulder.  She has been taking ibuprofen and Tylenol without significant relief of symptoms.  She was seen at an urgent care setting shortly after  the pain began where it was noted that she had rotator cuff tendinitis seen on x-ray.  Review of Systems as detailed in HPI.  All others reviewed and are negative.   Objective: Vital Signs: Ht 5\' 6"  (1.676 m)   Wt 200 lb 3.2 oz (90.8 kg)   LMP 04/25/2011   BMI 32.31 kg/m   Physical Exam well-developed well-nourished female no acute distress.  Alert oriented x3.  Ortho Exam examination of her left shoulder reveals approximately 140 degrees of forward flexion and abduction.  She can internally rotate to L5.  Moderately positive empty can and cross body test.  She has tenderness to the top of her shoulder.  She is neurovascular intact distally.  Specialty Comments:  No specialty comments available.  Imaging: No new imaging   PMFS History: Patient Active Problem List   Diagnosis Date Noted  . Burning with urination 09/25/2019  . Frequent urination 09/25/2019  . Gross hematuria 09/25/2019  . Internal bleeding hemorrhoids 08/30/2019  . Change in bowel habits 08/30/2019  . Loose stools 08/30/2019  . Paresthesia of both lower extremities 11/14/2018  . Elevated LDL cholesterol level 10/17/2018  . Proteinuria 10/17/2018  . History of 2019 novel coronavirus disease (COVID-19) 09/24/2018  . History of pneumonia 09/24/2018  . Controlled type 2 diabetes mellitus without complication, without long-term  current use of insulin ([place]) 09/11/2018  . Acute on chronic respiratory failure with hypoxia ([place]) 09/05/2018  . Prediabetes 08/31/2018  . Hypokalemia 08/31/2018  . Allergic rhinitis due to pollen 06/12/2018  . Dyslipidemia 06/05/2017  . Diabetes ([place])   . Hemorrhoids 03/15/2017  . Rectal bleeding 03/15/2017  . Essential hypertension 10/26/2015  . Obesity (BMI 30.0-34.9) 10/26/2015  . Chronic vaginitis 10/26/2015  . Bipolar affective disorder ([place]) 10/26/2015  . Chest pain 07/14/2013  . Dysfunctional uterine bleeding 03/02/2011   Past Medical History:  Diagnosis Date  . Acute  cholecystitis 07/14/2013  . Anemia    has history, takes iron  . Anxiety   . Bipolar affective disorder ([place]) 10/26/2015   Has seen counselor, declined medications per medical record from [facility]. hypermanic  . Chest pain 07/14/2013  . Chronic vaginitis 10/26/2015   Has h/o vag dryness and tried Estrace per [place] records  . COVID-19 virus infection 08/24/2018  . Depression   . Diabetes [facility])    Patient denies  . Diverticulitis    no problems  . Dysfunctional uterine bleeding 03/02/2011  . Elevated hemoglobin A1c 10/26/2015  . GERD (gastroesophageal reflux disease)    tums prn  . Headache(784.0)   . Hemorrhoids   . HTN (hypertension)    BP meds since fall 2016  . Internal hemorrhoids   . Nausea & vomiting 07/14/2013  . Obesity 10/26/2015  . Rectal bleeding 03/15/2017    Family History  Problem Relation Age of Onset  . Diabetes Mother   . Hypertension Mother   . Arthritis Father 27  . Hypertension Brother   . Cancer Brother        blood   . Cancer Brother        unsure kind, as a child   . CVA Other   . Colon cancer Neg Hx   . Colon polyps Neg Hx   . Esophageal cancer Neg Hx   . Rectal cancer Neg Hx   . Stomach cancer Neg Hx   . Pancreatic cancer Neg Hx     Past Surgical History:  Procedure Laterality Date  . CHOLECYSTECTOMY N/A 07/15/2013   Procedure: LAPAROSCOPIC CHOLECYSTECTOMY;  Surgeon: Rolm Bookbinder, MD;  Location: [facility];  Service: General;  Laterality: N/A;  . COLONOSCOPY    . ECTOPIC PREGNANCY SURGERY     laparotomy  . HEMORRHOID BANDING    . ROBOTIC ASSISTED LAP VAGINAL HYSTERECTOMY     Social History   Occupational History  . Occupation: Education officer, museum, Chief of Staff  Tobacco Use  . Smoking status: Former Smoker    Types: Cigarettes    Quit date: 04/18/2008    Years since quitting: 11.5  . Smokeless tobacco: Never Used  Vaping Use  . Vaping Use: Never used  Substance and Sexual Activity  . Alcohol use: Yes    Comment: 1 glass of wine rarely,  occasionally  . Drug use: No    Types: Cocaine    Comment: Former cocaine use since 2012  . Sexual activity: Yes    Birth control/protection: Surgical

## 2019-11-07 NOTE — Progress Notes (Signed)
   Subjective:    Patient ID: Deborah Haney, female    DOB: May 12, 1965, 54 y.o.   MRN: [id number]  HPI Chief Complaint  Patient presents with  . STD check    std check. discharge and strong odor x 4 days. the other day was really bad, has a history of BV and yeast    She is here requesting STD testing. States she has been having vaginal discharge with a bad odor for the past 4 days or so. Also has had vaginal irritation intermittently and not sure if she has sores or not. Hx of HSV. She has been having unprotected sex with her partner and unsure if partner has been faithful.   Denies fever, chills, abdominal pain, back pain, N/V/D, urinary symptoms.   Reviewed allergies, medications, past medical, surgical, family, and social history.    Review of Systems Pertinent positives and negatives in the history of present illness.     Objective:   Physical Exam Exam conducted with a chaperone present.  Abdominal:     General: Abdomen is flat. There is no distension.     Palpations: Abdomen is soft.     Tenderness: There is no abdominal tenderness.  Genitourinary:    Labia:        Right: No rash, tenderness or lesion.        Left: No rash, tenderness or lesion.      Vagina: Vaginal discharge present.     Comments: Thick, white clumpy discharge    BP 120/70   Pulse 83   Temp 98.3 F (36.8 C)   Wt 202 lb 9.6 oz (91.9 kg)   LMP 04/25/2011   BMI 32.70 kg/m         Assessment & Plan:  Vaginal discharge - Plan: RPR, POCT Wet Prep ([place]), NuSwab Vaginitis Plus (VG+), HIV Antibody (routine testing w rflx), Hepatitis C antibody, HSV(herpes simplex vrs) 1+2 ab-IgM, HSV(herpes simplex vrs) 1+2 ab-IgG  Possible exposure to STD - Plan: RPR, NuSwab Vaginitis Plus (VG+), HIV Antibody (routine testing w rflx), Hepatitis C antibody, HSV(herpes simplex vrs) 1+2 ab-IgM, HSV(herpes simplex vrs) 1+2 ab-IgG  Vaginal odor - Plan: RPR, POCT Wet Prep ([place]), NuSwab Vaginitis Plus  (VG+), HSV(herpes simplex vrs) 1+2 ab-IgM, HSV(herpes simplex vrs) 1+2 ab-IgG  BV (bacterial vaginosis) - Plan: POCT Wet Prep ([place])  Candida vaginitis  Vaginal irritation - Plan: POCT Wet Prep ([place]), NuSwab Vaginitis Plus (VG+), HSV(herpes simplex vrs) 1+2 ab-IgM, HSV(herpes simplex vrs) 1+2 ab-IgG  Wet prep +BV and yeast Metronidazole and diflucan prescribed.  Avoid alcohol and avoid sexual activity until results are back.  Follow up pending labs and symptoms.

## 2019-11-08 LAB — HSV(HERPES SIMPLEX VRS) I + II AB-IGG
HSV 1 Glycoprotein G Ab, IgG: <0.91 index (ref 0.00–0.90)
HSV 2 IgG, Type Spec: 9.35 index — ABNORMAL HIGH (ref 0.00–0.90)

## 2019-11-08 LAB — HSV(HERPES SIMPLEX VRS) I + II AB-IGM: HSVI/II Comb IgM: <0.91 Ratio (ref 0.00–0.90)

## 2019-11-08 LAB — HEPATITIS C ANTIBODY: Hep C Virus Ab: <0.1 s/co ratio (ref 0.0–0.9)

## 2019-11-08 LAB — RPR: RPR Ser Ql: NONREACTIVE

## 2019-11-08 LAB — HIV ANTIBODY (ROUTINE TESTING W REFLEX): HIV Screen 4th Generation wRfx: NONREACTIVE

## 2019-11-09 LAB — NUSWAB VAGINITIS PLUS (VG+)
Atopobium vaginae: HIGH Score — AB
BVAB 2: HIGH Score — AB
Candida albicans, NAA: POSITIVE — AB
Candida glabrata, NAA: NEGATIVE
Chlamydia trachomatis, NAA: NEGATIVE
Neisseria gonorrhoeae, NAA: NEGATIVE
Trich vag by NAA: NEGATIVE

## 2019-11-15 MED ORDER — OZEMPIC (0.25 OR 0.5 MG/DOSE) 2 MG/1.5ML ~~LOC~~ SOPN: PEN_INJECTOR | SUBCUTANEOUS | 2 refills | Status: DC

## 2019-11-15 NOTE — Addendum Note (Signed)
Addended by: Minette Headland A on: 11/15/2019 04:43 PM   Modules accepted: Orders

## 2019-11-15 NOTE — Telephone Encounter (Signed)
Ok

## 2019-11-15 NOTE — Telephone Encounter (Signed)
P.A was done & approved  

## 2019-12-02 ENCOUNTER — Encounter: Payer: Self-pay | Admitting: Family Medicine

## 2019-12-02 MED ORDER — OZEMPIC (1 MG/DOSE) 2 MG/1.5ML ~~LOC~~ SOPN: 1.0000 mg | PEN_INJECTOR | SUBCUTANEOUS | 1 refills | Status: DC

## 2019-12-12 ENCOUNTER — Encounter: Payer: BC Managed Care – PPO | Admitting: Internal Medicine

## 2020-01-06 ENCOUNTER — Encounter: Payer: Self-pay | Admitting: Family Medicine

## 2020-01-20 ENCOUNTER — Ambulatory Visit: Payer: BC Managed Care – PPO | Admitting: Family Medicine

## 2020-01-21 NOTE — Progress Notes (Signed)
Subjective:    Patient ID: Deborah Haney, female    DOB: [birth year], 54 y.o.   MRN: [id number]  CATHELINE HIXON is a 54 y.o. female who presents for follow-up of Type 2 diabetes mellitus.  States since starting on Ozempic she has not felt well. Reports having nausea, loss of appetite and she would like to stop it.  States she has lost weight.   Reports having pins and needles sensation in her arms and legs. Previously she has had these in her legs. No numbness, tingling or weakness.   She is now a Librarian, academic at [employer].    Patient are checking home blood sugars.   Home blood sugar records: every other day How often is blood sugars being checked: a few times per week  Current symptoms include: nausea, poor appetite and weight loss. Patient denies paresthesia of the feet, polydipsia, polyuria and vomiting.  Patient is checking their feet daily. Any Foot concerns (callous, ulcer, wound, thickened nails, toenail fungus, skin fungus, hammer toe): none  Last dilated eye exam: years ago   Current treatments: Ozempic. Medication compliance: good  Current diet: in general, a "healthy" diet   Current exercise: fairly regular Known diabetic complications: none  The following portions of the patient's history were reviewed and updated as appropriate: allergies, current medications, past medical history, past social history and problem list.  ROS as in subjective above.     Objective:    Physical Exam Alert and in no distress otherwise not examined.  Blood pressure 112/74, pulse 72, temperature (!) 97 F (36.1 C), temperature source Tympanic, resp. rate 16, height 5\' 6"  (1.676 m), weight 188 lb (85.3 kg), last menstrual period 04/25/2011.  Lab Review Diabetic Labs Latest Ref Rng & Units 01/22/2020 10/18/2019 04/18/2019 12/12/2018 10/17/2018  HbA1c 4.0 - 5.6 % 5.8(A) 6.8(A) 6.5(H) - 6.4(H)  Microalbumin mg/dL - - - - -  Micro/Creat Ratio <30 mcg/mg creat - - - - -  Chol 100 - 199  mg/dL - 142 - 130 198  HDL >39 mg/dL - 43 - 45 33(L)  Calc LDL 0 - 99 mg/dL - 78 - 67 102(H)  Triglycerides 0 - 149 mg/dL - 119 - 97 314(H)  Creatinine 0.57 - 1.00 mg/dL - 0.86 0.85 0.96 0.73   BP/Weight 01/22/2020 11/07/2019 11/07/2019 11/07/2019 1/96/2229  Systolic BP 798 921 - 194 174  Diastolic BP 74 70 - 80 83  Wt. (Lbs) 188 202.6 200.2 201 -  BMI 30.34 32.7 32.31 32.44 -   Foot/eye exam completion dates Latest Ref Rng & Units 11/14/2018 02/02/2017  Eye Exam No Retinopathy - -  Foot Form Completion - Done Done    W[place]  reports that she quit smoking about 11 years ago. Her smoking use included cigarettes. She has never used smokeless tobacco. She reports current alcohol use. She reports that she does not use drugs.     Assessment & Plan:    Controlled type 2 diabetes mellitus without complication, without long-term current use of insulin ([place]) - Plan: HgB A1c, Comprehensive metabolic panel, TSH, T4, free, Vitamin B12  Essential hypertension - Plan: CBC with Differential/Platelet, Comprehensive metabolic panel  Paresthesia of both lower extremities - Plan: CBC with Differential/Platelet, Comprehensive metabolic panel, Sedimentation rate, TSH, T4, free, Vitamin B12, Iron, TIBC and Ferritin Panel  Dyslipidemia  Vitamin D deficiency - Plan: VITAMIN D 25 Hydroxy (Vit-D Deficiency, Fractures)  Paresthesias - Plan: CBC with Differential/Platelet, Comprehensive metabolic panel, Sedimentation rate, TSH, T4, free, Vitamin  B12, Iron, TIBC and Ferritin Panel  1. Rx changes: stop Ozempic due to side effects. Hgb A1c 5.8% she prefers to try controlling diabetes with diet and exercise as opposed to starting metformin.  2. Education: Reviewed 'ABCs' of diabetes management (respective goals in parentheses):  A1C (<7), blood pressure (<130/80), and cholesterol (LDL <100). 3. Compliance at present is estimated to be good. Efforts to improve compliance (if necessary) will be directed at dietary  modifications: hopefully her appetite will improve after stopping Ozempic, increased exercise and regular blood sugar monitoring: 2-3 times weekly. 4. BP controlled.  5. Continue statin 6. Check vitamin D and follow up 7. Paresthesias- intermittent in extremities but not in hands or feet. Unclear etiology. Check labs and follow up. Consider referral to neurology if worsening.  8. Follow up: 3 months or sooner if needed.

## 2020-01-22 ENCOUNTER — Encounter: Payer: Self-pay | Admitting: Family Medicine

## 2020-01-22 ENCOUNTER — Other Ambulatory Visit: Payer: Self-pay

## 2020-01-22 ENCOUNTER — Ambulatory Visit: Payer: BC Managed Care – PPO | Admitting: Family Medicine

## 2020-01-22 VITALS — BP 112/74 | HR 72 | Temp 97.0°F | Resp 16 | Ht 66.0 in | Wt 188.0 lb

## 2020-01-22 DIAGNOSIS — E785 Hyperlipidemia, unspecified: Secondary | ICD-10-CM | POA: Diagnosis not present

## 2020-01-22 DIAGNOSIS — R202 Paresthesia of skin: Secondary | ICD-10-CM | POA: Diagnosis not present

## 2020-01-22 DIAGNOSIS — E559 Vitamin D deficiency, unspecified: Secondary | ICD-10-CM | POA: Diagnosis not present

## 2020-01-22 DIAGNOSIS — E119 Type 2 diabetes mellitus without complications: Secondary | ICD-10-CM | POA: Diagnosis not present

## 2020-01-22 DIAGNOSIS — I10 Essential (primary) hypertension: Secondary | ICD-10-CM

## 2020-01-22 LAB — POCT GLYCOSYLATED HEMOGLOBIN (HGB A1C): Hemoglobin A1C: 5.8 % — AB (ref 4.0–5.6)

## 2020-01-23 ENCOUNTER — Encounter: Payer: Self-pay | Admitting: Family Medicine

## 2020-01-23 ENCOUNTER — Ambulatory Visit (HOSPITAL_COMMUNITY)
Admission: EM | Admit: 2020-01-23 | Discharge: 2020-01-23 | Disposition: A | Discharge status: Home / Self Care | Payer: BC Managed Care – PPO | Attending: Urgent Care | Admitting: Urgent Care

## 2020-01-23 ENCOUNTER — Encounter (HOSPITAL_COMMUNITY): Payer: Self-pay | Admitting: Emergency Medicine

## 2020-01-23 ENCOUNTER — Other Ambulatory Visit: Payer: Self-pay

## 2020-01-23 DIAGNOSIS — R319 Hematuria, unspecified: Secondary | ICD-10-CM

## 2020-01-23 DIAGNOSIS — R350 Frequency of micturition: Secondary | ICD-10-CM

## 2020-01-23 DIAGNOSIS — N3001 Acute cystitis with hematuria: Secondary | ICD-10-CM | POA: Diagnosis not present

## 2020-01-23 DIAGNOSIS — R300 Dysuria: Secondary | ICD-10-CM | POA: Diagnosis not present

## 2020-01-23 LAB — CBC WITH DIFFERENTIAL/PLATELET
Basophils Absolute: 0.1 10*3/uL (ref 0.0–0.2)
Basos: 1 %
EOS (ABSOLUTE): 0.2 10*3/uL (ref 0.0–0.4)
Eos: 2 %
Hematocrit: 38.7 % (ref 34.0–46.6)
Hemoglobin: 12.9 g/dL (ref 11.1–15.9)
Immature Grans (Abs): 0 10*3/uL (ref 0.0–0.1)
Immature Granulocytes: 0 %
Lymphocytes Absolute: 2.9 10*3/uL (ref 0.7–3.1)
Lymphs: 46 %
MCH: 29.2 pg (ref 26.6–33.0)
MCHC: 33.3 g/dL (ref 31.5–35.7)
MCV: 88 fL (ref 79–97)
Monocytes Absolute: 0.4 10*3/uL (ref 0.1–0.9)
Monocytes: 7 %
Neutrophils Absolute: 2.8 10*3/uL (ref 1.4–7.0)
Neutrophils: 44 %
Platelets: 330 10*3/uL (ref 150–450)
RBC: 4.42 x10E6/uL (ref 3.77–5.28)
RDW: 13.9 % (ref 11.7–15.4)
WBC: 6.3 10*3/uL (ref 3.4–10.8)

## 2020-01-23 LAB — POCT URINALYSIS DIPSTICK, ED / UC
Bilirubin Urine: NEGATIVE
Glucose, UA: NEGATIVE mg/dL
Nitrite: NEGATIVE
Protein, ur: >=300 mg/dL — AB
Specific Gravity, Urine: 1.025 (ref 1.005–1.030)
Urobilinogen, UA: 0.2 mg/dL (ref 0.0–1.0)
pH: 6 (ref 5.0–8.0)

## 2020-01-23 LAB — COMPREHENSIVE METABOLIC PANEL
ALT: 15 IU/L (ref 0–32)
AST: 14 IU/L (ref 0–40)
Albumin/Globulin Ratio: 1.5 (ref 1.2–2.2)
Albumin: 4.5 g/dL (ref 3.8–4.9)
Alkaline Phosphatase: 60 IU/L (ref 44–121)
BUN/Creatinine Ratio: 11 (ref 9–23)
BUN: 11 mg/dL (ref 6–24)
Bilirubin Total: 0.6 mg/dL (ref 0.0–1.2)
CO2: 22 mmol/L (ref 20–29)
Calcium: 9.5 mg/dL (ref 8.7–10.2)
Chloride: 103 mmol/L (ref 96–106)
Creatinine, Ser: 1.01 mg/dL — ABNORMAL HIGH (ref 0.57–1.00)
GFR calc Af Amer: 74 mL/min/{1.73_m2} (ref >59)
GFR calc non Af Amer: 64 mL/min/{1.73_m2} (ref >59)
Globulin, Total: 3.1 g/dL (ref 1.5–4.5)
Glucose: 91 mg/dL (ref 65–99)
Potassium: 4.7 mmol/L (ref 3.5–5.2)
Sodium: 140 mmol/L (ref 134–144)
Total Protein: 7.6 g/dL (ref 6.0–8.5)

## 2020-01-23 LAB — SEDIMENTATION RATE: Sed Rate: 41 mm/hr — ABNORMAL HIGH (ref 0–40)

## 2020-01-23 LAB — TSH: TSH: 0.916 u[IU]/mL (ref 0.450–4.500)

## 2020-01-23 LAB — VITAMIN D 25 HYDROXY (VIT D DEFICIENCY, FRACTURES): Vit D, 25-Hydroxy: 19.5 ng/mL — ABNORMAL LOW (ref 30.0–100.0)

## 2020-01-23 LAB — IRON,TIBC AND FERRITIN PANEL
Ferritin: 28 ng/mL (ref 15–150)
Iron Saturation: 14 % — ABNORMAL LOW (ref 15–55)
Iron: 46 ug/dL (ref 27–159)
Total Iron Binding Capacity: 331 ug/dL (ref 250–450)
UIBC: 285 ug/dL (ref 131–425)

## 2020-01-23 LAB — VITAMIN B12: Vitamin B-12: 596 pg/mL (ref 232–1245)

## 2020-01-23 LAB — T4, FREE: Free T4: 1.16 ng/dL (ref 0.82–1.77)

## 2020-01-23 MED ORDER — CEPHALEXIN 500 MG PO CAPS: 500.0000 mg | ORAL_CAPSULE | Freq: Two times a day (BID) | ORAL | 0 refills | Status: DC

## 2020-01-23 NOTE — ED Provider Notes (Signed)
[facility]   MRN: [id number] DOB: [birth year]  Subjective:   Deborah Haney is a 54 y.o. female presenting for 4-day history of urinary pressure that worsened and started with dysuria, urinary frequency and hematuria today.  Patient has had UTIs in the past and this feels the same.  Last UTI was in July, had an E. coli infection that was susceptible to all antibiotics.  Patient tries to drink about 3-4 bottles of water a day.  She does have 2 cups of coffee as well.  Denies fever, nausea, vomiting, belly or flank pain.  Denies history of kidney stone.  No current facility-administered medications for this encounter.  Current Outpatient Medications:  .  atorvastatin (LIPITOR) 10 MG tablet, Take 1 tablet by mouth once daily, Disp: 90 tablet, Rfl: 1 .  lisinopril-hydrochlorothiazide (ZESTORETIC) 10-12.5 MG tablet, Take 1 tablet by mouth once daily, Disp: 90 tablet, Rfl: 1 .  Blood Glucose Monitoring Suppl (CONTOUR MONITOR) w/Device KIT, 1 kit by Does not apply route 2 (two) times a day., Disp: 1 kit, Rfl: 0 .  glucose blood (CONTOUR NEXT TEST) test strip, Test twice a day, Disp: 100 each, Rfl: 1 .  ibuprofen (ADVIL) 800 MG tablet, Take 800 mg by mouth every 6 (six) hours as needed. (Patient not taking: Reported on 01/22/2020), Disp: , Rfl:  .  methocarbamol (ROBAXIN) 500 MG tablet, Take 1 tablet (500 mg total) by mouth 2 (two) times daily as needed. (Patient not taking: Reported on 01/22/2020), Disp: 20 tablet, Rfl: 0 .  Microlet Lancets MISC, Test blood sugar twice a day, Disp: 100 each, Rfl: 1 .  Multiple Vitamin (MULTIVITAMIN) tablet, Take 1 tablet by mouth daily.  (Patient not taking: Reported on 01/22/2020), Disp: , Rfl:  .  mupirocin ointment (BACTROBAN) 2 %, Apply thin layer to affected area 3 times daily x 7 days. (Patient not taking: Reported on 01/22/2020), Disp: 22 g, Rfl: 0 .  Semaglutide, 1 MG/DOSE, (OZEMPIC, 1 MG/DOSE,) 2 MG/1.5ML SOPN, Inject 1 mg into the skin  once a week., Disp: 1.5 mL, Rfl: 1   No Known Allergies  Past Medical History:  Diagnosis Date  . Acute cholecystitis 07/14/2013  . Acute on chronic respiratory failure with hypoxia ([place]) 09/05/2018  . Anemia    has history, takes iron  . Anxiety   . Bipolar affective disorder ([place]) 10/26/2015   Has seen counselor, declined medications per medical record from [facility]. hypermanic  . Chest pain 07/14/2013  . Chronic vaginitis 10/26/2015   Has h/o vag dryness and tried Estrace per [facility] records  . COVID-19 virus infection 08/24/2018  . Depression   . Diabetes [facility])    Patient denies  . Diverticulitis    no problems  . Dysfunctional uterine bleeding 03/02/2011  . Elevated hemoglobin A1c 10/26/2015  . GERD (gastroesophageal reflux disease)    tums prn  . Headache(784.0)   . Hemorrhoids   . HTN (hypertension)    BP meds since fall 2016  . Internal hemorrhoids   . Nausea & vomiting 07/14/2013  . Obesity 10/26/2015  . Rectal bleeding 03/15/2017     Past Surgical History:  Procedure Laterality Date  . CHOLECYSTECTOMY N/A 07/15/2013   Procedure: LAPAROSCOPIC CHOLECYSTECTOMY;  Surgeon: Rolm Bookbinder, MD;  Location: [facility];  Service: General;  Laterality: N/A;  . COLONOSCOPY    . ECTOPIC PREGNANCY SURGERY     laparotomy  . HEMORRHOID BANDING    . ROBOTIC ASSISTED LAP VAGINAL HYSTERECTOMY  Family History  Problem Relation Age of Onset  . Diabetes Mother   . Hypertension Mother   . Arthritis Father 69  . Hypertension Brother   . Cancer Brother        blood   . Cancer Brother        unsure kind, as a child   . CVA Other   . Colon cancer Neg Hx   . Colon polyps Neg Hx   . Esophageal cancer Neg Hx   . Rectal cancer Neg Hx   . Stomach cancer Neg Hx   . Pancreatic cancer Neg Hx     Social History   Tobacco Use  . Smoking status: Former Smoker    Types: Cigarettes    Quit date: 04/18/2008    Years since quitting: 11.7  . Smokeless tobacco: Never Used  Vaping Use  .  Vaping Use: Never used  Substance Use Topics  . Alcohol use: Yes    Comment: 1 glass of wine rarely, occasionally  . Drug use: No    Types: Cocaine    Comment: Former cocaine use since 2012    ROS   Objective:   Vitals: BP 128/77 (BP Location: Left Arm)   Pulse 86   Temp 98.3 F (36.8 C) (Oral)   Resp 18   LMP 04/25/2011   SpO2 100%   Physical Exam Constitutional:      General: She is not in acute distress.    Appearance: Normal appearance. She is well-developed and normal weight. She is not ill-appearing, toxic-appearing or diaphoretic.  HENT:     Head: Normocephalic and atraumatic.     Right Ear: External ear normal.     Left Ear: External ear normal.     Nose: Nose normal.     Mouth/Throat:     Mouth: Mucous membranes are moist.     Pharynx: Oropharynx is clear.  Eyes:     General: No scleral icterus.    Extraocular Movements: Extraocular movements intact.     Pupils: Pupils are equal, round, and reactive to light.  Cardiovascular:     Rate and Rhythm: Normal rate and regular rhythm.     Heart sounds: Normal heart sounds. No murmur heard.  No friction rub. No gallop.   Pulmonary:     Effort: Pulmonary effort is normal. No respiratory distress.     Breath sounds: Normal breath sounds. No stridor. No wheezing, rhonchi or rales.  Abdominal:     General: Bowel sounds are normal. There is no distension.     Palpations: Abdomen is soft. There is no mass.     Tenderness: There is no abdominal tenderness. There is no right CVA tenderness, left CVA tenderness, guarding or rebound.  Skin:    General: Skin is warm and dry.     Coloration: Skin is not pale.     Findings: No rash.  Neurological:     General: No focal deficit present.     Mental Status: She is alert and oriented to person, place, and time.  Psychiatric:        Mood and Affect: Mood normal.        Behavior: Behavior normal.        Thought Content: Thought content normal.        Judgment: Judgment  normal.     Results for orders placed or performed during the hospital encounter of 01/23/20 (from the past 24 hour(s))  POC Urinalysis dipstick     Status: Abnormal  Collection Time: 01/23/20  5:11 PM  Result Value Ref Range   Glucose, UA NEGATIVE NEGATIVE mg/dL   Bilirubin Urine NEGATIVE NEGATIVE   Ketones, ur TRACE (A) NEGATIVE mg/dL   Specific Gravity, Urine 1.025 1.005 - 1.030   Hgb urine dipstick LARGE (A) NEGATIVE   pH 6.0 5.0 - 8.0   Protein, ur >=300 (A) NEGATIVE mg/dL   Urobilinogen, UA 0.2 0.0 - 1.0 mg/dL   Nitrite NEGATIVE NEGATIVE   Leukocytes,Ua SMALL (A) NEGATIVE    Assessment and Plan :   PDMP not reviewed this encounter.  1. Acute cystitis with hematuria   2. Dysuria   3. Urinary frequency     Start Keflex to cover for acute cystitis, urine culture pending.  Recommended aggressive hydration, limiting urinary irritants. Counseled patient on potential for adverse effects with medications prescribed/recommended today, ER and return-to-clinic precautions discussed, patient verbalized understanding.    Jaynee Eagles, PA-C 01/23/20 1730

## 2020-01-23 NOTE — Discharge Instructions (Signed)
Make sure you hydrate very well with plain water and a quantity of 64 ounces of water a day.  Please limit drinks that are considered urinary irritants such as soda, sweet tea, coffee, energy drinks, alcohol.  These can worsen your UTI symptoms and also be the source of them.  I will let you know about your urine culture results through MyChart to see if we need to change your antibiotics based off of those results.

## 2020-01-23 NOTE — ED Triage Notes (Signed)
Patient reports having pressure feeling Tuesday.  Today noticed blood in urine and having to urinate more frequently.  No pain in back or stomach.  Patient has had a uti in the past

## 2020-01-24 ENCOUNTER — Other Ambulatory Visit: Payer: Self-pay | Admitting: Family Medicine

## 2020-01-24 MED ORDER — VITAMIN D (ERGOCALCIFEROL) 1.25 MG (50000 UNIT) PO CAPS: 50000.0000 [IU] | ORAL_CAPSULE | ORAL | 0 refills | Status: DC

## 2020-01-25 LAB — URINE CULTURE: Culture: 40000 — AB

## 2020-01-27 ENCOUNTER — Telehealth (HOSPITAL_COMMUNITY): Payer: Self-pay | Admitting: Emergency Medicine

## 2020-01-27 MED ORDER — SULFAMETHOXAZOLE-TRIMETHOPRIM 800-160 MG PO TABS: 1.0000 | ORAL_TABLET | Freq: Two times a day (BID) | ORAL | 0 refills | Status: AC

## 2020-01-27 MED ORDER — FLUCONAZOLE 150 MG PO TABS: 150.0000 mg | ORAL_TABLET | Freq: Once | ORAL | 0 refills | Status: AC

## 2020-01-27 NOTE — Telephone Encounter (Signed)
Patient requested Diflucan with new order for abx.  States hx of same, developing symptoms.  Will send in protocol Diflucan along with bactrim.

## 2020-02-07 ENCOUNTER — Encounter: Payer: BC Managed Care – PPO | Admitting: Internal Medicine

## 2020-02-21 ENCOUNTER — Encounter: Payer: Self-pay | Admitting: Family Medicine

## 2020-02-21 MED ORDER — FLUCONAZOLE 150 MG PO TABS: 150.0000 mg | ORAL_TABLET | Freq: Once | ORAL | 0 refills | Status: AC

## 2020-02-25 ENCOUNTER — Ambulatory Visit (INDEPENDENT_AMBULATORY_CARE_PROVIDER_SITE_OTHER): Payer: BC Managed Care – PPO

## 2020-02-25 DIAGNOSIS — Z23 Encounter for immunization: Secondary | ICD-10-CM

## 2020-02-27 ENCOUNTER — Other Ambulatory Visit: Payer: Self-pay | Admitting: Family Medicine

## 2020-04-02 LAB — HM DIABETES EYE EXAM

## 2020-04-06 ENCOUNTER — Ambulatory Visit (INDEPENDENT_AMBULATORY_CARE_PROVIDER_SITE_OTHER): Payer: BC Managed Care – PPO

## 2020-04-06 ENCOUNTER — Other Ambulatory Visit: Payer: Self-pay

## 2020-04-06 ENCOUNTER — Encounter (HOSPITAL_COMMUNITY): Payer: Self-pay

## 2020-04-06 ENCOUNTER — Ambulatory Visit (HOSPITAL_COMMUNITY)
Admission: EM | Admit: 2020-04-06 | Discharge: 2020-04-06 | Disposition: A | Discharge status: Home / Self Care | Payer: BC Managed Care – PPO | Attending: Urgent Care | Admitting: Urgent Care

## 2020-04-06 DIAGNOSIS — S82891A Other fracture of right lower leg, initial encounter for closed fracture: Secondary | ICD-10-CM

## 2020-04-06 DIAGNOSIS — M25571 Pain in right ankle and joints of right foot: Secondary | ICD-10-CM | POA: Diagnosis not present

## 2020-04-06 MED ORDER — IBUPROFEN 800 MG PO TABS
ORAL_TABLET | ORAL | Status: AC
  Filled 2020-04-06: qty 1

## 2020-04-06 MED ORDER — TRAMADOL HCL 50 MG PO TABS
50.0000 mg | ORAL_TABLET | Freq: Four times a day (QID) | ORAL | 0 refills | Status: DC | PRN
Start: 2020-04-06 — End: 2020-06-17

## 2020-04-06 MED ORDER — IBUPROFEN 800 MG PO TABS
800.0000 mg | ORAL_TABLET | Freq: Once | ORAL | Status: AC
  Administered 2020-04-06: 800 mg via ORAL

## 2020-04-06 NOTE — ED Triage Notes (Signed)
Pt presents with right ankle injury after slipping von some steps this evening.

## 2020-04-06 NOTE — ED Provider Notes (Addendum)
[name]   MRN: [id number] DOB: Jan 20, 1966  Subjective:   Deborah Haney is a 55 y.o. female presenting for suffering a right ankle injury today. Patient slipped on the stairs, rolled her ankle and has had significant pain, swelling, difficulty bearing weight. Has not taken medications for relief.   No current facility-administered medications for this encounter.  Current Outpatient Medications:  .  atorvastatin (LIPITOR) 10 MG tablet, Take 1 tablet by mouth once daily, Disp: 90 tablet, Rfl: 1 .  Blood Glucose Monitoring Suppl (CONTOUR MONITOR) w/Device KIT, 1 kit by Does not apply route 2 (two) times a day., Disp: 1 kit, Rfl: 0 .  cephALEXin (KEFLEX) 500 MG capsule, Take 1 capsule (500 mg total) by mouth 2 (two) times daily., Disp: 10 capsule, Rfl: 0 .  CONTOUR NEXT TEST test strip, USE 1 STRIP TO CHECK GLUCOSE TWICE DAILY, Disp: 100 each, Rfl: 0 .  ibuprofen (ADVIL) 800 MG tablet, Take 800 mg by mouth every 6 (six) hours as needed. (Patient not taking: Reported on 01/22/2020), Disp: , Rfl:  .  lisinopril-hydrochlorothiazide (ZESTORETIC) 10-12.5 MG tablet, Take 1 tablet by mouth once daily, Disp: 90 tablet, Rfl: 1 .  methocarbamol (ROBAXIN) 500 MG tablet, Take 1 tablet (500 mg total) by mouth 2 (two) times daily as needed. (Patient not taking: Reported on 01/22/2020), Disp: 20 tablet, Rfl: 0 .  Microlet Lancets MISC, Test blood sugar twice a day, Disp: 100 each, Rfl: 1 .  Multiple Vitamin (MULTIVITAMIN) tablet, Take 1 tablet by mouth daily.  (Patient not taking: Reported on 01/22/2020), Disp: , Rfl:  .  mupirocin ointment (BACTROBAN) 2 %, Apply thin layer to affected area 3 times daily x 7 days. (Patient not taking: Reported on 01/22/2020), Disp: 22 g, Rfl: 0 .  Semaglutide, 1 MG/DOSE, (OZEMPIC, 1 MG/DOSE,) 2 MG/1.5ML SOPN, Inject 1 mg into the skin once a week., Disp: 1.5 mL, Rfl: 1 .  Vitamin D, Ergocalciferol, (DRISDOL) 1.25 MG (50000 UNIT) CAPS capsule, Take 1  capsule (50,000 Units total) by mouth every 7 (seven) days., Disp: 12 capsule, Rfl: 0   No Known Allergies  Past Medical History:  Diagnosis Date  . Acute cholecystitis 07/14/2013  . Acute on chronic respiratory failure with hypoxia ([place]) 09/05/2018  . Anemia    has history, takes iron  . Anxiety   . Bipolar affective disorder ([place]) 10/26/2015   Has seen counselor, declined medications per medical record from [facility]. hypermanic  . Chest pain 07/14/2013  . Chronic vaginitis 10/26/2015   Has h/o vag dryness and tried Estrace per [facility] records  . COVID-19 virus infection 08/24/2018  . Depression   . Diabetes [facility])    Patient denies  . Diverticulitis    no problems  . Dysfunctional uterine bleeding 03/02/2011  . Elevated hemoglobin A1c 10/26/2015  . GERD (gastroesophageal reflux disease)    tums prn  . Headache(784.0)   . Hemorrhoids   . HTN (hypertension)    BP meds since fall 2016  . Internal hemorrhoids   . Nausea & vomiting 07/14/2013  . Obesity 10/26/2015  . Rectal bleeding 03/15/2017     Past Surgical History:  Procedure Laterality Date  . CHOLECYSTECTOMY N/A 07/15/2013   Procedure: LAPAROSCOPIC CHOLECYSTECTOMY;  Surgeon: Rolm Bookbinder, MD;  Location: [facility];  Service: General;  Laterality: N/A;  . COLONOSCOPY    . ECTOPIC PREGNANCY SURGERY     laparotomy  . HEMORRHOID BANDING    . ROBOTIC ASSISTED LAP VAGINAL HYSTERECTOMY  Family History  Problem Relation Age of Onset  . Diabetes Mother   . Hypertension Mother   . Arthritis Father 55  . Hypertension Brother   . Cancer Brother        blood   . Cancer Brother        unsure kind, as a child   . CVA Other   . Colon cancer Neg Hx   . Colon polyps Neg Hx   . Esophageal cancer Neg Hx   . Rectal cancer Neg Hx   . Stomach cancer Neg Hx   . Pancreatic cancer Neg Hx     Social History   Tobacco Use  . Smoking status: Former Smoker    Types: Cigarettes    Quit date: 04/18/2008    Years since quitting: 11.9  .  Smokeless tobacco: Never Used  Vaping Use  . Vaping Use: Never used  Substance Use Topics  . Alcohol use: Yes    Comment: 1 glass of wine rarely, occasionally  . Drug use: No    Types: Cocaine    Comment: Former cocaine use since 2012    ROS   Objective:   Vitals: BP (!) 155/96 (BP Location: Right Arm)   Pulse 100   Temp 98.5 F (36.9 C) (Oral)   Resp 20   LMP 04/25/2011   SpO2 96%   Physical Exam Constitutional:      General: She is not in acute distress.    Appearance: Normal appearance. She is well-developed. She is not ill-appearing, toxic-appearing or diaphoretic.  HENT:     Head: Normocephalic and atraumatic.     Nose: Nose normal.     Mouth/Throat:     Mouth: Mucous membranes are moist.     Pharynx: Oropharynx is clear.  Eyes:     General: No scleral icterus.       Right eye: No discharge.        Left eye: No discharge.     Extraocular Movements: Extraocular movements intact.     Conjunctiva/sclera: Conjunctivae normal.     Pupils: Pupils are equal, round, and reactive to light.  Cardiovascular:     Rate and Rhythm: Normal rate.  Pulmonary:     Effort: Pulmonary effort is normal.  Musculoskeletal:     Right ankle: Swelling present. No deformity, ecchymosis or lacerations. Tenderness present over the lateral malleolus, ATF ligament and AITF ligament. No medial malleolus, CF ligament, posterior TF ligament, base of 5th metatarsal or proximal fibula tenderness. Decreased range of motion.  Skin:    General: Skin is warm and dry.  Neurological:     General: No focal deficit present.     Mental Status: She is alert and oriented to person, place, and time.     Motor: No weakness.     Coordination: Coordination normal.     Gait: Gait normal.     Deep Tendon Reflexes: Reflexes normal.  Psychiatric:        Mood and Affect: Mood normal.        Behavior: Behavior normal.        Thought Content: Thought content normal.        Judgment: Judgment normal.    DG  Ankle Complete Right  Result Date: 04/06/2020 CLINICAL DATA:  Right ankle pain after injury. EXAM: RIGHT ANKLE - COMPLETE 3+ VIEW COMPARISON:  None. FINDINGS: Subtle nondisplaced fracture of the distal fibula at the level of the ankle mortise. Curvilinear density lateral to the midfoot is seen  only on the oblique view. The ankle mortise is preserved. Mild degenerative tibial talar spurring. Tiny plantar calcaneal spur. There is an ankle joint effusion. Soft tissue edema most prominent laterally. IMPRESSION: 1. Subtle nondisplaced distal fibular fracture at the level of the ankle mortise. 2. Curvilinear density lateral to the midfoot seen only on the oblique view, raising concern for a midfoot avulsion fracture, donor site uncertain. 3. Lateral soft tissue edema. Electronically Signed   By: Keith Rake M.D.   On: 04/06/2020 20:14   Assessment and Plan :   PDMP not reviewed this encounter.  1. Closed fracture of right ankle, initial encounter   2. Acute right ankle pain     Patient placed in posterior and stirrup splint. Recommended scheduling Tylenol. Ibuprofen given in clinic. Follow up with ortho, information provided to the patient for this. Recommended non-weight bearing or ambulate with crutches. Counseled patient on potential for adverse effects with medications prescribed/recommended today, ER and return-to-clinic precautions discussed, patient verbalized understanding.    Jaynee Eagles, [place] 04/07/20 [phone]

## 2020-04-06 NOTE — Discharge Instructions (Addendum)
Please contact Deborah Haney or Deborah Haney for follow up on your ankle fracture. Do not use any nonsteroidal anti-inflammatories (NSAIDs) like ibuprofen, Motrin, naproxen, Aleve, etc. which are all available over-the-counter.  Please just use Tylenol at a dose of 500mg -650mg  once every 6 hours as needed for your aches, pains, fevers. For severe pain use tramadol. If you do not need tramadol do not use it.

## 2020-04-06 NOTE — Progress Notes (Signed)
Orthopedic Tech Progress Note Patient Details:  BRENDI MCCARROLL [birth year] [id number]  Ortho Devices Type of Ortho Device: Post (short leg) splint,Stirrup splint Ortho Device/Splint Location: rle Ortho Device/Splint Interventions: Ordered,Adjustment,Application   Post Interventions Patient Tolerated: Well Instructions Provided: Care of device,Adjustment of device   Karolee Stamps 04/06/2020, 9:06 PM

## 2020-04-07 DIAGNOSIS — S8264XA Nondisplaced fracture of lateral malleolus of right fibula, initial encounter for closed fracture: Secondary | ICD-10-CM | POA: Diagnosis not present

## 2020-04-07 DIAGNOSIS — M25571 Pain in right ankle and joints of right foot: Secondary | ICD-10-CM | POA: Diagnosis not present

## 2020-04-14 DIAGNOSIS — M25571 Pain in right ankle and joints of right foot: Secondary | ICD-10-CM | POA: Diagnosis not present

## 2020-04-24 ENCOUNTER — Encounter: Payer: Self-pay | Admitting: Family Medicine

## 2020-04-24 ENCOUNTER — Ambulatory Visit: Payer: BC Managed Care – PPO | Admitting: Family Medicine

## 2020-04-24 ENCOUNTER — Other Ambulatory Visit: Payer: Self-pay

## 2020-04-24 VITALS — BP 120/70 | HR 73

## 2020-04-24 DIAGNOSIS — I10 Essential (primary) hypertension: Secondary | ICD-10-CM

## 2020-04-24 DIAGNOSIS — E785 Hyperlipidemia, unspecified: Secondary | ICD-10-CM

## 2020-04-24 DIAGNOSIS — E119 Type 2 diabetes mellitus without complications: Secondary | ICD-10-CM | POA: Diagnosis not present

## 2020-04-24 DIAGNOSIS — E559 Vitamin D deficiency, unspecified: Secondary | ICD-10-CM | POA: Diagnosis not present

## 2020-04-24 DIAGNOSIS — F4321 Adjustment disorder with depressed mood: Secondary | ICD-10-CM

## 2020-04-24 LAB — POCT GLYCOSYLATED HEMOGLOBIN (HGB A1C): Hemoglobin A1C: 6.3 % — AB (ref 4.0–5.6)

## 2020-04-24 LAB — LIPID PANEL

## 2020-04-24 NOTE — Patient Instructions (Addendum)
You can add Glucerna  Take a Women's One A Day   You can call to schedule your appointment with the counselor. A few offices are listed below for you to call.   Deborah Haney (female) [phone]     [facility] [address] [place], [place] [address]  Phone: [facility]  [address], [place], [place] [address]  Phone: [phone]  [facility] [address] [place], [place] [address] [phone] for appointments   [facility]  [address] [place], [place] [address] [place]  (across from [facility])  [place] Resident only [address], [place] [employer], [place] [address] [phone]   [facility] [address] [place], [place] [address] [phone]

## 2020-04-24 NOTE — Progress Notes (Signed)
Subjective:    Patient ID: Deborah Haney, female    DOB: Oct 15, 1965, 55 y.o.   MRN: [id number]  Deborah Haney is a 55 y.o. female who presents for follow-up of Type 2 diabetes mellitus.  She is walking in a boot on her right ankle today due to recent fracture of her right ankle.  She is concerned about the bruising on her leg   States she has been depressed. This is has been since her injury. She is requesting help finding a therapist.  No SI. She is not self medicating. Hx of bipolar disorder.   Vitamin D def- has been taking prescription vitamin D  HTN- doing well on medication Taking statin daily   Patient has not checked them in over a month checking home blood sugars.   Home blood sugar records: patient does not check sugars How often is blood sugars being checked: suppose to be once a day Current symptoms include: decreased appetite. Patient denies increased appetite, nausea, visual disturbances, vomiting and weight loss.  Patient is checking their feet daily. Any Foot concerns (callous, ulcer, wound, thickened nails, toenail fungus, skin fungus, hammer toe): none Last dilated eye exam: January-[facility]  Current treatments: doing well on meds. Diabetes is diet controlled.  Medication compliance: good  Current diet: in general, a "healthy" diet   Current exercise: none due to injury Known diabetic complications: none  The following portions of the patient's history were reviewed and updated as appropriate: allergies, current medications, past medical history, past social history and problem list.  ROS as in subjective above.     Objective:    Physical Exam Alert and in no distress. Right ankle and foot with bruising. Distal pulses intact and good capillary refill. Skin intact.   Blood pressure 120/70, pulse 73, last menstrual period 04/25/2011.  Lab Review Diabetic Labs Latest Ref Rng & Units 04/24/2020 01/22/2020 10/18/2019 04/18/2019 12/12/2018  HbA1c 4.0 - 5.6  % 6.3(A) 5.8(A) 6.8(A) 6.5(H) -  Microalbumin mg/dL - - - - -  Micro/Creat Ratio <30 mcg/mg creat - - - - -  Chol 100 - 199 mg/dL 119 - 142 - 130  HDL >39 mg/dL 42 - 43 - 45  Calc LDL 0 - 99 mg/dL 63 - 78 - 67  Triglycerides 0 - 149 mg/dL 67 - 119 - 97  Creatinine 0.57 - 1.00 mg/dL 0.91 1.01(H) 0.86 0.85 0.96   BP/Weight 04/24/2020 04/06/2020 01/23/2020 03/55/9741 08/08/8451  Systolic BP 646 803 212 248 250  Diastolic BP 70 96 77 74 70  Wt. (Lbs) - - - 188 202.6  BMI - - - 30.34 32.7   Foot/eye exam completion dates Latest Ref Rng & Units 11/14/2018 02/02/2017  Eye Exam No Retinopathy - -  Foot Form Completion - Done Done    Magali  reports that she quit smoking about 12 years ago. Her smoking use included cigarettes. She has never used smokeless tobacco. She reports current alcohol use. She reports that she does not use drugs.     Assessment & Plan:    Controlled type 2 diabetes mellitus without complication, without long-term current use of insulin ([place]) - Plan: HgB A1c, CBC with Differential/Platelet, Comprehensive metabolic panel, TSH  Essential hypertension - Plan: CBC with Differential/Platelet, Comprehensive metabolic panel  Vitamin D deficiency - Plan: VITAMIN D 25 Hydroxy (Vit-D Deficiency, Fractures)  Dyslipidemia - Plan: Lipid panel  Situational depression  1. Rx changes: none diet controlled. Hgb A1c 6.3% 2. Education: Reviewed 'ABCs' of  diabetes management (respective goals in parentheses):  A1C (<7), blood pressure (<130/80), and cholesterol (LDL <100). 3. Compliance at present is estimated to be good. Efforts to improve compliance (if necessary) will be directed at dietary modifications: cut back on foods high in sugar including grapes and increased exercise. 4. Recommended counseling and a list was provided. Encouraged her to make a list and to prioritize her "to [place]" 5. Continue HTN medications and statin.  6. Follow up pending vitamin D level.  7. Follow up: 6  months

## 2020-04-25 LAB — COMPREHENSIVE METABOLIC PANEL
ALT: 15 IU/L (ref 0–32)
AST: 12 IU/L (ref 0–40)
Albumin/Globulin Ratio: 1.4 (ref 1.2–2.2)
Albumin: 4.6 g/dL (ref 3.8–4.9)
Alkaline Phosphatase: 63 IU/L (ref 44–121)
BUN/Creatinine Ratio: 10 (ref 9–23)
BUN: 9 mg/dL (ref 6–24)
Bilirubin Total: 1.1 mg/dL (ref 0.0–1.2)
CO2: 23 mmol/L (ref 20–29)
Calcium: 9.9 mg/dL (ref 8.7–10.2)
Chloride: 99 mmol/L (ref 96–106)
Creatinine, Ser: 0.91 mg/dL (ref 0.57–1.00)
GFR calc Af Amer: 84 mL/min/{1.73_m2} (ref >59)
GFR calc non Af Amer: 73 mL/min/{1.73_m2} (ref >59)
Globulin, Total: 3.3 g/dL (ref 1.5–4.5)
Glucose: 120 mg/dL — ABNORMAL HIGH (ref 65–99)
Potassium: 4.2 mmol/L (ref 3.5–5.2)
Sodium: 139 mmol/L (ref 134–144)
Total Protein: 7.9 g/dL (ref 6.0–8.5)

## 2020-04-25 LAB — LIPID PANEL
Chol/HDL Ratio: 2.8 ratio (ref 0.0–4.4)
Cholesterol, Total: 119 mg/dL (ref 100–199)
HDL: 42 mg/dL (ref >39)
LDL Chol Calc (NIH): 63 mg/dL (ref 0–99)
Triglycerides: 67 mg/dL (ref 0–149)
VLDL Cholesterol Cal: 14 mg/dL (ref 5–40)

## 2020-04-25 LAB — VITAMIN D 25 HYDROXY (VIT D DEFICIENCY, FRACTURES): Vit D, 25-Hydroxy: 74.2 ng/mL (ref 30.0–100.0)

## 2020-04-25 LAB — CBC WITH DIFFERENTIAL/PLATELET
Basophils Absolute: 0.1 10*3/uL (ref 0.0–0.2)
Basos: 1 %
EOS (ABSOLUTE): 0.1 10*3/uL (ref 0.0–0.4)
Eos: 2 %
Hematocrit: 38.9 % (ref 34.0–46.6)
Hemoglobin: 13 g/dL (ref 11.1–15.9)
Immature Grans (Abs): 0 10*3/uL (ref 0.0–0.1)
Immature Granulocytes: 0 %
Lymphocytes Absolute: 2.6 10*3/uL (ref 0.7–3.1)
Lymphs: 49 %
MCH: 28.6 pg (ref 26.6–33.0)
MCHC: 33.4 g/dL (ref 31.5–35.7)
MCV: 86 fL (ref 79–97)
Monocytes Absolute: 0.4 10*3/uL (ref 0.1–0.9)
Monocytes: 7 %
Neutrophils Absolute: 2.2 10*3/uL (ref 1.4–7.0)
Neutrophils: 41 %
Platelets: 301 10*3/uL (ref 150–450)
RBC: 4.55 x10E6/uL (ref 3.77–5.28)
RDW: 14 % (ref 11.7–15.4)
WBC: 5.4 10*3/uL (ref 3.4–10.8)

## 2020-04-25 LAB — TSH: TSH: 1.04 u[IU]/mL (ref 0.450–4.500)

## 2020-04-28 LAB — HM DIABETES EYE EXAM

## 2020-04-29 ENCOUNTER — Telehealth: Payer: Self-pay | Admitting: Family Medicine

## 2020-04-29 DIAGNOSIS — F319 Bipolar disorder, unspecified: Secondary | ICD-10-CM | POA: Diagnosis not present

## 2020-04-29 NOTE — Telephone Encounter (Signed)
Received requested records from [facility]

## 2020-05-01 ENCOUNTER — Emergency Department (HOSPITAL_COMMUNITY)
Admission: EM | Admit: 2020-05-01 | Discharge: 2020-05-01 | Disposition: A | Discharge status: Home / Self Care | Payer: BC Managed Care – PPO | Attending: Emergency Medicine | Admitting: Emergency Medicine

## 2020-05-01 ENCOUNTER — Encounter (HOSPITAL_COMMUNITY): Payer: Self-pay

## 2020-05-01 ENCOUNTER — Other Ambulatory Visit: Payer: Self-pay

## 2020-05-01 ENCOUNTER — Emergency Department (HOSPITAL_COMMUNITY): Payer: BC Managed Care – PPO

## 2020-05-01 ENCOUNTER — Encounter: Payer: Self-pay | Admitting: Internal Medicine

## 2020-05-01 ENCOUNTER — Encounter: Payer: Self-pay | Admitting: Family Medicine

## 2020-05-01 DIAGNOSIS — R110 Nausea: Secondary | ICD-10-CM | POA: Diagnosis not present

## 2020-05-01 DIAGNOSIS — M542 Cervicalgia: Secondary | ICD-10-CM | POA: Diagnosis not present

## 2020-05-01 DIAGNOSIS — R519 Headache, unspecified: Secondary | ICD-10-CM | POA: Diagnosis not present

## 2020-05-01 DIAGNOSIS — I10 Essential (primary) hypertension: Secondary | ICD-10-CM | POA: Diagnosis not present

## 2020-05-01 DIAGNOSIS — R42 Dizziness and giddiness: Secondary | ICD-10-CM | POA: Diagnosis not present

## 2020-05-01 DIAGNOSIS — R55 Syncope and collapse: Secondary | ICD-10-CM | POA: Diagnosis not present

## 2020-05-01 DIAGNOSIS — Z87891 Personal history of nicotine dependence: Secondary | ICD-10-CM | POA: Insufficient documentation

## 2020-05-01 DIAGNOSIS — Z8616 Personal history of COVID-19: Secondary | ICD-10-CM | POA: Insufficient documentation

## 2020-05-01 DIAGNOSIS — Z79899 Other long term (current) drug therapy: Secondary | ICD-10-CM | POA: Diagnosis not present

## 2020-05-01 DIAGNOSIS — E119 Type 2 diabetes mellitus without complications: Secondary | ICD-10-CM | POA: Diagnosis not present

## 2020-05-01 LAB — CBC WITH DIFFERENTIAL/PLATELET
Abs Immature Granulocytes: 0.01 10*3/uL (ref 0.00–0.07)
Basophils Absolute: 0 10*3/uL (ref 0.0–0.1)
Basophils Relative: 1 %
Eosinophils Absolute: 0.1 10*3/uL (ref 0.0–0.5)
Eosinophils Relative: 2 %
HCT: 34.9 % — ABNORMAL LOW (ref 36.0–46.0)
Hemoglobin: 11.3 g/dL — ABNORMAL LOW (ref 12.0–15.0)
Immature Granulocytes: 0 %
Lymphocytes Relative: 36 %
Lymphs Abs: 2.5 10*3/uL (ref 0.7–4.0)
MCH: 29.1 pg (ref 26.0–34.0)
MCHC: 32.4 g/dL (ref 30.0–36.0)
MCV: 89.9 fL (ref 80.0–100.0)
Monocytes Absolute: 0.4 10*3/uL (ref 0.1–1.0)
Monocytes Relative: 6 %
Neutro Abs: 3.8 10*3/uL (ref 1.7–7.7)
Neutrophils Relative %: 55 %
Platelets: 276 10*3/uL (ref 150–400)
RBC: 3.88 MIL/uL (ref 3.87–5.11)
RDW: 14.8 % (ref 11.5–15.5)
WBC: 6.9 10*3/uL (ref 4.0–10.5)
nRBC: 0 % (ref 0.0–0.2)

## 2020-05-01 LAB — BASIC METABOLIC PANEL
Anion gap: 8 (ref 5–15)
BUN: 15 mg/dL (ref 6–20)
CO2: 23 mmol/L (ref 22–32)
Calcium: 8.6 mg/dL — ABNORMAL LOW (ref 8.9–10.3)
Chloride: 108 mmol/L (ref 98–111)
Creatinine, Ser: 0.91 mg/dL (ref 0.44–1.00)
GFR, Estimated: >60 mL/min (ref >60)
Glucose, Bld: 130 mg/dL — ABNORMAL HIGH (ref 70–99)
Potassium: 3.3 mmol/L — ABNORMAL LOW (ref 3.5–5.1)
Sodium: 139 mmol/L (ref 135–145)

## 2020-05-01 MED ORDER — MECLIZINE HCL 25 MG PO TABS
25.0000 mg | ORAL_TABLET | Freq: Once | ORAL | Status: AC
  Administered 2020-05-01: 25 mg via ORAL
  Filled 2020-05-01: qty 1

## 2020-05-01 MED ORDER — SODIUM CHLORIDE 0.9 % IV BOLUS
1000.0000 mL | Freq: Once | INTRAVENOUS | Status: AC
  Administered 2020-05-01: 1000 mL via INTRAVENOUS

## 2020-05-01 MED ORDER — MECLIZINE HCL 25 MG PO TABS: 25.0000 mg | ORAL_TABLET | Freq: Three times a day (TID) | ORAL | 0 refills | Status: DC | PRN

## 2020-05-01 MED ORDER — IOHEXOL 350 MG/ML SOLN
80.0000 mL | Freq: Once | INTRAVENOUS | Status: AC | PRN
  Administered 2020-05-01: 80 mL via INTRAVENOUS

## 2020-05-01 NOTE — ED Provider Notes (Signed)
[facility] DEPT Provider Note   CSN: [id number] Arrival date & time: 05/01/20  0345    History Chief Complaint  Patient presents with  . Nausea    Deborah Haney is a 55 y.o. female with past medical history significant for bipolar who presents for evaluation of dizziness, headache and left-sided neck pain. Patient states she was awoken up out of her sleep with sudden onset sharp left-sided neck pain. Went into the posterior the left side of her head. HA resolved. She immediately became dizzy. She is unable to describe the dizziness. She denies any paresthesias or weakness. Patient states at that time she felt like she needed to use the restroom, got up to go to the restroom and felt like she was going to pass out.  Initially denied syncope however states "I just fell over."  States it took her a few minutes to call out to her sister for help.  Patient denies any paresthesias, unilateral weakness, chest pain, shortness of breath, abdominal pain, diarrhea or dysuria. Rates her neck pain a 4/10. Feels improved since onset, now described as a dull aching to left lateral neck.  Denies any neck stiffness or rigidity.  Denies additional aggravating or alleviating factors. No fever or chills at home.  Patient states she did not feel well in general prior to going to bed. "Felt blah." She took a tramadol as well as a muscle relaxer.  She is unsure what time she went to bed.  Does have boot to RLE from fx 1 month ago. Denies an increase in pain, swelling, redness to LE.  Apparently per EMS patient was orthostatic.  Was given 500 cc bolus as well as Zofran for nausea.  HPI     Past Medical History:  Diagnosis Date  . Acute cholecystitis 07/14/2013  . Acute on chronic respiratory failure with hypoxia ([place]) 09/05/2018  . Anemia    has history, takes iron  . Anxiety   . Bipolar affective disorder ([place]) 10/26/2015   Has seen counselor, declined medications per medical  record from [facility]. hypermanic  . Chest pain 07/14/2013  . Chronic vaginitis 10/26/2015   Has h/o vag dryness and tried Estrace per [facility] records  . COVID-19 virus infection 08/24/2018  . Depression   . Diabetes [facility])    Patient denies  . Diverticulitis    no problems  . Dysfunctional uterine bleeding 03/02/2011  . Elevated hemoglobin A1c 10/26/2015  . GERD (gastroesophageal reflux disease)    tums prn  . Headache(784.0)   . Hemorrhoids   . HTN (hypertension)    BP meds since fall 2016  . Internal hemorrhoids   . Nausea & vomiting 07/14/2013  . Obesity 10/26/2015  . Rectal bleeding 03/15/2017    Patient Active Problem List   Diagnosis Date Noted  . Situational depression 04/24/2020  . Vitamin D deficiency 04/24/2020  . Burning with urination 09/25/2019  . Frequent urination 09/25/2019  . Gross hematuria 09/25/2019  . Internal bleeding hemorrhoids 08/30/2019  . Paresthesia of both lower extremities 11/14/2018  . Elevated LDL cholesterol level 10/17/2018  . Proteinuria 10/17/2018  . History of 2019 novel coronavirus disease (COVID-19) 09/24/2018  . History of pneumonia 09/24/2018  . Controlled type 2 diabetes mellitus without complication, without long-term current use of insulin ([place]) 09/11/2018  . Allergic rhinitis due to pollen 06/12/2018  . Dyslipidemia 06/05/2017  . Essential hypertension 10/26/2015  . Obesity (BMI 30.0-34.9) 10/26/2015  . Chronic vaginitis 10/26/2015  . Bipolar affective disorder (  [place]) 10/26/2015  . Chest pain 07/14/2013  . Dysfunctional uterine bleeding 03/02/2011    Past Surgical History:  Procedure Laterality Date  . CHOLECYSTECTOMY N/A 07/15/2013   Procedure: LAPAROSCOPIC CHOLECYSTECTOMY;  Surgeon: Rolm Bookbinder, MD;  Location: [facility];  Service: General;  Laterality: N/A;  . COLONOSCOPY    . ECTOPIC PREGNANCY SURGERY     laparotomy  . HEMORRHOID BANDING    . ROBOTIC ASSISTED LAP VAGINAL HYSTERECTOMY       OB History    Gravida  1   Para   0   Term  0   Preterm  0   AB  1   Living  0     SAB  0   IAB  0   Ectopic  1   Multiple  0   Live Births              Family History  Problem Relation Age of Onset  . Diabetes Mother   . Hypertension Mother   . Arthritis Father 66  . Hypertension Brother   . Cancer Brother        blood   . Cancer Brother        unsure kind, as a child   . CVA Other   . Colon cancer Neg Hx   . Colon polyps Neg Hx   . Esophageal cancer Neg Hx   . Rectal cancer Neg Hx   . Stomach cancer Neg Hx   . Pancreatic cancer Neg Hx     Social History   Tobacco Use  . Smoking status: Former Smoker    Types: Cigarettes    Quit date: 04/18/2008    Years since quitting: 12.0  . Smokeless tobacco: Never Used  Vaping Use  . Vaping Use: Never used  Substance Use Topics  . Alcohol use: Yes    Comment: 1 glass of wine rarely, occasionally  . Drug use: No    Types: Cocaine    Comment: Former cocaine use since 2012    Home Medications Prior to Admission medications   Medication Sig Start Date End Date Taking? Authorizing Provider  atorvastatin (LIPITOR) 10 MG tablet Take 1 tablet by mouth once daily 10/21/19   Henson, Vickie L, NP-C  Blood Glucose Monitoring Suppl (CONTOUR MONITOR) w/Device KIT 1 kit by Does not apply route 2 (two) times a day. 07/02/18   Girtha Rm, NP-C  CONTOUR NEXT TEST test strip USE 1 STRIP TO CHECK GLUCOSE TWICE DAILY 02/27/20   Henson, Vickie L, NP-C  lisinopril-hydrochlorothiazide (ZESTORETIC) 10-12.5 MG tablet Take 1 tablet by mouth once daily 10/21/19   Henson, Vickie L, NP-C  methocarbamol (ROBAXIN) 750 MG tablet Take 750 mg by mouth every 6 (six) hours as needed. 04/14/20   [provider]  Microlet Lancets MISC Test blood sugar twice a day 07/02/18   Henson, Vickie L, NP-C  traMADol (ULTRAM) 50 MG tablet Take 1 tablet (50 mg total) by mouth every 6 (six) hours as needed. 04/06/20   Jaynee Eagles, PA-C    Allergies    Patient has no known  allergies.  Review of Systems   Review of Systems  Constitutional: Negative.   HENT: Negative.   Respiratory: Negative.   Cardiovascular: Negative.   Gastrointestinal: Positive for nausea and vomiting. Negative for abdominal distention, abdominal pain, anal bleeding, blood in stool, constipation, diarrhea and rectal pain.  Genitourinary: Negative.   Musculoskeletal: Positive for neck pain. Negative for neck stiffness.  Skin: Negative.  Neurological: Positive for dizziness, weakness (Generalized) and headaches. Negative for facial asymmetry, speech difficulty and numbness.  All other systems reviewed and are negative.   Physical Exam Updated Vital Signs BP 107/76   Pulse 78   Temp 97.7 F (36.5 C) (Oral)   Resp 16   Ht '5\' 6"'  (1.676 m)   Wt 85 kg   LMP 04/25/2011   SpO2 100%   BMI 30.25 kg/m   Physical Exam  Physical Exam  Constitutional: Pt is oriented to person, place, and time. Pt appears well-developed and well-nourished. No distress.  HENT:  Head: Normocephalic and atraumatic.  Mouth/Throat: Oropharynx is clear and moist.  Eyes: Conjunctivae and EOM are normal. Pupils are equal, round, and reactive to light. No scleral icterus.  No horizontal, vertical or rotational nystagmus  Neck: Normal range of motion. Neck supple.  Full active and passive ROM without pain No midline or paraspinal tenderness No nuchal rigidity or meningeal signs  Tenderness to left lateral neck.  No overlying skin changes Cardiovascular: Normal rate, regular rhythm and intact distal pulses.   Pulmonary/Chest: Effort normal and breath sounds normal. No respiratory distress. Pt has no wheezes. No rales.  Abdominal: Soft. Bowel sounds are normal. There is no tenderness. There is no rebound and no guarding.  Musculoskeletal: Normal range of motion.  Moves all 4 extremities without difficulty.  Compartments soft.  Right lower extremity in walking boot from prior fracture. Lymphadenopathy:    No  cervical adenopathy.  Neurological: Pt. is alert and oriented to person, place, and time. He has normal reflexes. No cranial nerve deficit.  Exhibits normal muscle tone. Coordination normal.  Mental Status:  Alert, oriented, thought content appropriate. Speech fluent without evidence of aphasia. Able to follow 2 step commands without difficulty.  Cranial Nerves:  II:  Peripheral visual fields grossly normal, pupils equal, round, reactive to light III,IV, VI: ptosis not present, extra-ocular motions intact bilaterally  V,VII: smile symmetric, facial light touch sensation equal VIII: hearing grossly normal bilaterally  IX,X: midline uvula rise  XI: bilateral shoulder shrug equal and strong XII: midline tongue extension  Motor:  5/5 in upper and lower extremities bilaterally including strong and equal grip strength and dorsiflexion/plantar flexion Sensory: Pinprick and light touch normal in all extremities.  Deep Tendon Reflexes: 2+ and symmetric  Cerebellar: normal finger-to-nose with bilateral upper extremities Gait: normal gait and balance CV: distal pulses palpable throughout   Skin: Skin is warm and dry. No rash noted. Pt is not diaphoretic.  Psychiatric: Pt has a normal mood and affect. Behavior is normal. Judgment and thought content normal.  Nursing note and vitals reviewed. ED Results / Procedures / Treatments   Labs (all labs ordered are listed, but only abnormal results are displayed) Labs Reviewed  CBC WITH DIFFERENTIAL/PLATELET - Abnormal; Notable for the following components:      Result Value   Hemoglobin 11.3 (*)    HCT 34.9 (*)    All other components within normal limits  BASIC METABOLIC PANEL - Abnormal; Notable for the following components:   Potassium 3.3 (*)    Glucose, Bld 130 (*)    Calcium 8.6 (*)    All other components within normal limits    EKG EKG Interpretation  Date/Time:  Friday May 01 2020 03:56:22 EST Ventricular Rate:  72 PR Interval:     QRS Duration: 112 QT Interval:  414 QTC Calculation: 454 R Axis:   55 Text Interpretation: Sinus rhythm Prolonged PR interval Borderline intraventricular conduction  delay Confirmed by Orpah Greek [id number]) on 05/01/2020 6:34:32 AM   Radiology No results found.  Procedures Procedures   Medications Ordered in ED Medications  sodium chloride 0.9 % bolus 1,000 mL (0 mLs Intravenous Stopped 05/01/20 0510)  iohexol (OMNIPAQUE) 350 MG/ML injection 80 mL (80 mLs Intravenous Contrast Given 05/01/20 0601)   ED Course  I have reviewed the triage vital signs and the nursing notes.  Pertinent labs & imaging results that were available during my care of the patient were reviewed by me and considered in my medical decision making (see chart for details).  55 year old here with sudden onset dizziness, left-sided sharp neck pain which awoke her from sleep.  She has a nonfocal neuro exam without deficits.  Was feeling generally unwell prior to bed and subsequently took a tramadol and a muscle relaxer.  No chest pain, shortness of breath.  Heart lungs clear.  Abdomen soft, nontender. Given dizziness, sharp, left-sided neck pain, emesis plan on CTA, labs and reassess. Patient does not want anything for dizziness at this time.  Labs and imaging personally reviewed and interpreted:  EKG without ischemic changes CBC without leukocytosis, hemoglobin 11.3 BMP mild hyponatremia at 3.3, glucose 130, no additional electrolyte, renal or liver normality CTA head pending CTA neck pending  Care transferred to [facility], PA-C who will follow up on imaging, reassessment and determine disposition.    MDM Rules/Calculators/A&P                           Final Clinical Impression(s) / ED Diagnoses Final diagnoses:  Neck pain  Dizziness    Rx / DC Orders ED Discharge Orders    None       Henderly, Britni A, PA-C 05/01/20 0657    Orpah Greek, MD 05/01/20 [phone]

## 2020-05-01 NOTE — ED Notes (Signed)
Patient assisted to bedside commode.

## 2020-05-01 NOTE — Discharge Instructions (Signed)
Take meclizine as needed for dizziness.  As we discussed, your blood pressure was slightly low here in the emergency department.  Over the next several days, please closely monitor your blood pressure and keep a record of it.  If you are finding that you are consistently low, please follow this up with your primary care doctor as your medication may need to be adjusted.  Make sure you are eating and drinking appropriately and staying hydrated.  Return the emergency department for any chest pain, shortness of breath, dizziness, difficulty walking, numbness/weakness in your arms or legs, nausea/vomiting or any other worsening concerning symptoms.

## 2020-05-01 NOTE — ED Notes (Signed)
Per MRI will do testing in about 15 mins

## 2020-05-01 NOTE — ED Triage Notes (Addendum)
Pt to ED by EMS from home with c/o nausea and associated dizziness which began at approx 0200. Pt had 539mls NS and 4mg  zofran by EMS. EMS reports pt to be orthostatic. Arrives A+O, VSS, NADN. Pt endorse taking a tramadol and a muscle relaxer prior to going to bed. Pt has a boot on her right foot for a broken ankle.

## 2020-05-01 NOTE — ED Provider Notes (Signed)
Care assumed from [facility], PA-C at shift change with CTA head and neck pending.   In brief, this patient is a 55 year old female who presents for evaluation of dizziness, headache, left-sided neck pain that began last night.  She reports that she did not feel well prior to going to sleep.  She states she woke up in the middle night and had headache, pain with side of her neck as well as dizzy.  She denies any numbness/weakness of her arms or legs, chest pain, difficulty breathing.  She states that when she went up to go to the restroom, she felt like she was going to pass out.  Patient was noted to be orthostatic on EMS arrival.  Please see note from previous provider for full history/physical exam.6   Physical Exam  BP 105/76   Pulse 70   Temp 98 F (36.7 C)   Resp 19   Ht 5\' 6"  (1.676 m)   Wt 85 kg   LMP 04/25/2011   SpO2 100%   BMI 30.25 kg/m   Physical Exam   2+ pulses radially  Normal gait  ED Course/Procedures   Clinical Course as of 05/01/20 1028  Fri May 01, 2020  0955 I was directly involved in this patients medical care.  [JH]    Clinical Course User Index [JH] Luna Fuse, MD    Procedures  Results for orders placed or performed during the hospital encounter of 05/01/20 (from the past 24 hour(s))  CBC with Differential     Status: Abnormal   Collection Time: 05/01/20  4:06 AM  Result Value Ref Range   WBC 6.9 4.0 - 10.5 K/uL   RBC 3.88 3.87 - 5.11 MIL/uL   Hemoglobin 11.3 (L) 12.0 - 15.0 g/dL   HCT 34.9 (L) 36.0 - 46.0 %   MCV 89.9 80.0 - 100.0 fL   MCH 29.1 26.0 - 34.0 pg   MCHC 32.4 30.0 - 36.0 g/dL   RDW 14.8 11.5 - 15.5 %   Platelets 276 150 - 400 K/uL   nRBC 0.0 0.0 - 0.2 %   Neutrophils Relative % 55 %   Neutro Abs 3.8 1.7 - 7.7 K/uL   Lymphocytes Relative 36 %   Lymphs Abs 2.5 0.7 - 4.0 K/uL   Monocytes Relative 6 %   Monocytes Absolute 0.4 0.1 - 1.0 K/uL   Eosinophils Relative 2 %   Eosinophils Absolute 0.1 0.0 - 0.5 K/uL    Basophils Relative 1 %   Basophils Absolute 0.0 0.0 - 0.1 K/uL   Immature Granulocytes 0 %   Abs Immature Granulocytes 0.01 0.00 - 0.07 K/uL  Basic metabolic panel     Status: Abnormal   Collection Time: 05/01/20  4:06 AM  Result Value Ref Range   Sodium 139 135 - 145 mmol/L   Potassium 3.3 (L) 3.5 - 5.1 mmol/L   Chloride 108 98 - 111 mmol/L   CO2 23 22 - 32 mmol/L   Glucose, Bld 130 (H) 70 - 99 mg/dL   BUN 15 6 - 20 mg/dL   Creatinine, Ser 0.91 0.44 - 1.00 mg/dL   Calcium 8.6 (L) 8.9 - 10.3 mg/dL   GFR, Estimated >60 >60 mL/min   Anion gap 8 5 - 15    MDM    PLAN: Patient is pending CTA of her head and neck.  Labs unremarkable.  She did not want any medications for dizziness here in ED.  She has gotten IV fluids.  MDM:  CBC shows no leukocytosis.  Hemoglobin stable 1.3.  BMP shows potassium 3.3.  Normal BUN and creatinine.  CTA head and neck shows no acute abnormalities.  There is a small infundibulum of the left distal ICA suspected to be a normal variant.  Has advanced chronic facet degeneration of the cervical spine.  Reevaluation.  Patient still soft blood pressure 105/72.  Patient states she feels weak and tired.  I personally ambulated patient in the room.  She did not have any gait ataxia but did see that she felt dizzy and not normal when she walked.  She does currently have a walking boot that she uses for a fracture in her right foot so her gait was not completely normal but I did not see any swelling.  Patient reports she is still symptomatic.  At this time, will obtain MRI for well CVA.  MRI shows no evidence of acute infarction.  There is few foci of nonspecifically gnosis/demyelination of the cerebral white matter that may reflect chronic microvascular ischemic changes.  I discussed with Dr. Malen Gauze (neuro) who states that this is chronic changes there is nothing acute or nothing that needs to be further worked up in the ED regarding this.  Reevaluation.  Patient  blood pressure improved after 2 L of fluid.  I discussed with her regarding her blood pressure.  She does have a history of hypertension.  She does tell me that she has not really been eating drinking well over the last couple days.  She has been trying to drink but states she has not had much of an appetite.  She is still been taking her blood pressure medications.  Question of this cause her to be hypotensive what caused her symptoms.  She has not any more dizziness since the meclizine.  Patient states she feels tired but otherwise well.  At this time, she is hemodynamically stable.  Instruct patient follow-up with her primary care doctor. At this time, patient exhibits no emergent life-threatening condition that require further evaluation in ED. Patient had ample opportunity for questions and discussion. All patient's questions were answered with full understanding. Strict return precautions discussed. Patient expresses understanding and agreement to plan.    1. Neck pain   2. Dizziness    Portions of this note were generated with Dragon dictation software. Dictation errors may occur despite best attempts at proofreading.     Volanda Napoleon, PA-C 05/01/20 1028    Luna Fuse, MD 05/01/20 1451

## 2020-05-06 DIAGNOSIS — F319 Bipolar disorder, unspecified: Secondary | ICD-10-CM | POA: Diagnosis not present

## 2020-05-11 ENCOUNTER — Encounter: Payer: Self-pay | Admitting: Family Medicine

## 2020-05-13 DIAGNOSIS — F319 Bipolar disorder, unspecified: Secondary | ICD-10-CM | POA: Diagnosis not present

## 2020-05-14 ENCOUNTER — Encounter: Payer: Self-pay | Admitting: Family Medicine

## 2020-05-19 DIAGNOSIS — S8264XD Nondisplaced fracture of lateral malleolus of right fibula, subsequent encounter for closed fracture with routine healing: Secondary | ICD-10-CM | POA: Diagnosis not present

## 2020-05-20 DIAGNOSIS — M6281 Muscle weakness (generalized): Secondary | ICD-10-CM | POA: Diagnosis not present

## 2020-05-20 DIAGNOSIS — R269 Unspecified abnormalities of gait and mobility: Secondary | ICD-10-CM | POA: Diagnosis not present

## 2020-05-20 DIAGNOSIS — M25671 Stiffness of right ankle, not elsewhere classified: Secondary | ICD-10-CM | POA: Diagnosis not present

## 2020-05-20 DIAGNOSIS — S8264XD Nondisplaced fracture of lateral malleolus of right fibula, subsequent encounter for closed fracture with routine healing: Secondary | ICD-10-CM | POA: Diagnosis not present

## 2020-05-21 DIAGNOSIS — F319 Bipolar disorder, unspecified: Secondary | ICD-10-CM | POA: Diagnosis not present

## 2020-05-27 DIAGNOSIS — M25671 Stiffness of right ankle, not elsewhere classified: Secondary | ICD-10-CM | POA: Diagnosis not present

## 2020-05-27 DIAGNOSIS — M6281 Muscle weakness (generalized): Secondary | ICD-10-CM | POA: Diagnosis not present

## 2020-05-27 DIAGNOSIS — F319 Bipolar disorder, unspecified: Secondary | ICD-10-CM | POA: Diagnosis not present

## 2020-05-27 DIAGNOSIS — R269 Unspecified abnormalities of gait and mobility: Secondary | ICD-10-CM | POA: Diagnosis not present

## 2020-05-27 DIAGNOSIS — S8264XD Nondisplaced fracture of lateral malleolus of right fibula, subsequent encounter for closed fracture with routine healing: Secondary | ICD-10-CM | POA: Diagnosis not present

## 2020-05-30 IMAGING — DX PORTABLE CHEST - 1 VIEW
1 series · 1 of 1 positions shown · non-contrast
Comparison: Chest x-rays dated 04/02/2018 and 04/17/2017.

CLINICAL DATA: covid + with increasing symptoms over last week

EXAM:
PORTABLE CHEST 1 VIEW

[chest ap]
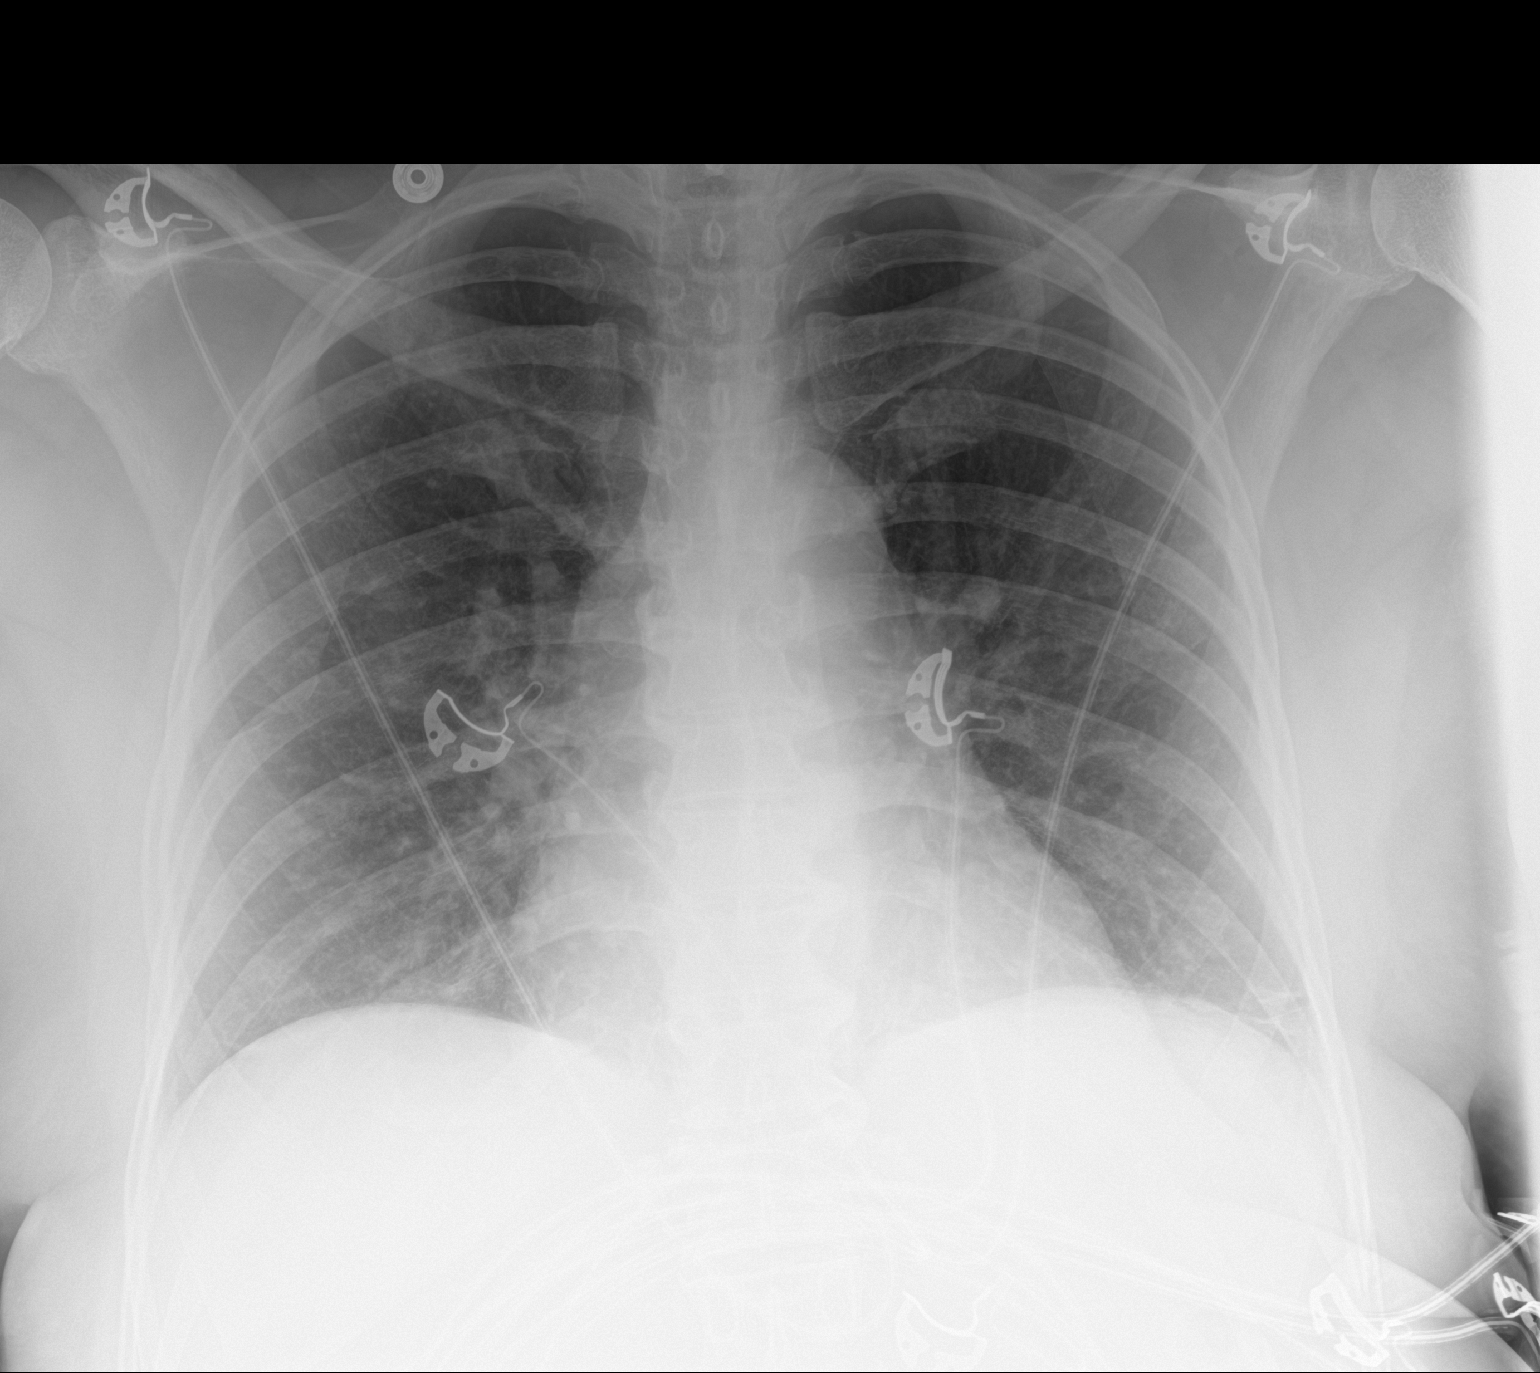

[1 of 1 positions shown; findings below may reference images not displayed]

FINDINGS: The heart size and mediastinal contours are within normal limits.
Both lungs are clear. The visualized skeletal structures are
unremarkable.
IMPRESSION: No active disease.

## 2020-06-01 IMAGING — DX PORTABLE CHEST - 1 VIEW
1 series · 1 of 1 positions shown · non-contrast
Comparison: 08/24/2018

CLINICAL DATA: Worsening cough. Covid positive.

EXAM:
PORTABLE CHEST 1 VIEW

[chest ap]
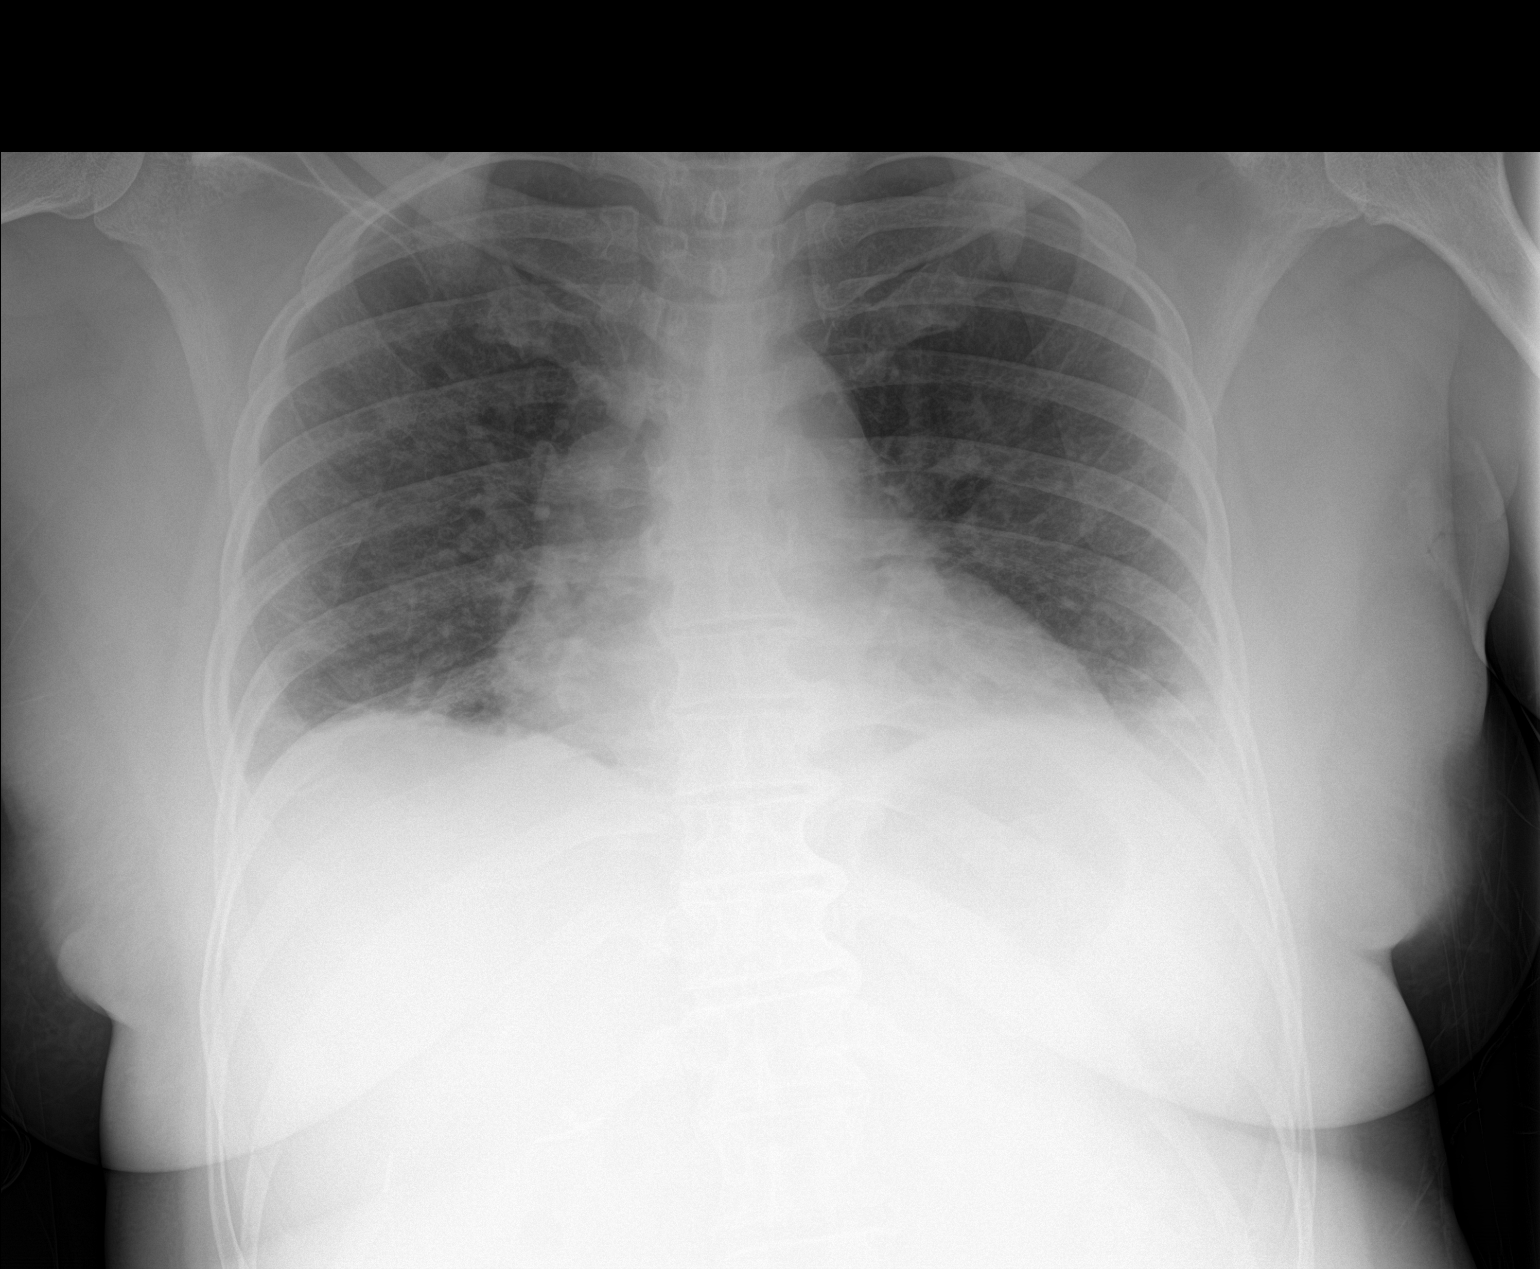

[1 of 1 positions shown; findings below may reference images not displayed]

FINDINGS: Cardiomediastinal silhouette is normal. Mediastinal contours appear
intact.

Subtle patchy lower lobe predominant peripheral airspace opacities.

Osseous structures are without acute abnormality. Soft tissues are
grossly normal.
IMPRESSION: Subtle patchy peripheral lower lobe predominant airspace opacities
may represent early multifocal viral pneumonia.

## 2020-06-04 DIAGNOSIS — F319 Bipolar disorder, unspecified: Secondary | ICD-10-CM | POA: Diagnosis not present

## 2020-06-11 DIAGNOSIS — F319 Bipolar disorder, unspecified: Secondary | ICD-10-CM | POA: Diagnosis not present

## 2020-06-15 ENCOUNTER — Encounter: Payer: Self-pay | Admitting: Internal Medicine

## 2020-06-17 ENCOUNTER — Encounter: Payer: Self-pay | Admitting: Family Medicine

## 2020-06-17 ENCOUNTER — Other Ambulatory Visit: Payer: Self-pay

## 2020-06-17 ENCOUNTER — Ambulatory Visit: Payer: BC Managed Care – PPO | Admitting: Family Medicine

## 2020-06-17 VITALS — BP 120/70 | HR 76 | Temp 98.3°F | Wt 177.8 lb

## 2020-06-17 DIAGNOSIS — N898 Other specified noninflammatory disorders of vagina: Secondary | ICD-10-CM | POA: Diagnosis not present

## 2020-06-17 DIAGNOSIS — F319 Bipolar disorder, unspecified: Secondary | ICD-10-CM | POA: Diagnosis not present

## 2020-06-17 DIAGNOSIS — Z9189 Other specified personal risk factors, not elsewhere classified: Secondary | ICD-10-CM | POA: Diagnosis not present

## 2020-06-17 LAB — POCT WET PREP (WET MOUNT)
Clue Cells Wet Prep Whiff POC: NEGATIVE
Trichomonas Wet Prep HPF POC: ABSENT

## 2020-06-17 NOTE — Progress Notes (Signed)
   Subjective:    Patient ID: Deborah Haney, female    DOB: 02-21-1966, 55 y.o.   MRN: [id number]  HPI Chief Complaint  Patient presents with  . Vaginal Discharge    Vaginal discharge x 1 week. Odor, wants STD check   Here with complaints of a 1 week history of vaginal discharge. States she has concerns for STD due to sexual partner cheating on her.   Blood sugars over the past 7 days have been good except BS around 140s 3 days.   No fever, chills, abdominal pain, N/V/D.   States she has a new therapist and is doing much better emotionally.     Review of Systems Pertinent positives and negatives in the history of present illness.     Objective:   Physical Exam BP 120/70   Pulse 76   Temp 98.3 F (36.8 C)   Wt 177 lb 12.8 oz (80.6 kg)   LMP 04/25/2011   BMI 28.70 kg/m   Alert and oriented and in on acute distress. Respirations unlabored. Speculum exam with swab performed. Scant amount of thin discharge noted. Chaperone present.       Assessment & Plan:  Vaginal discharge - Plan: POCT Wet Prep [facility] [place]), NuSwab Vaginitis Plus (VG+), RPR, HIV Antibody (routine testing w rflx)  Vaginal odor - Plan: POCT Wet Prep ([place]), NuSwab Vaginitis Plus (VG+)  At risk for sexually transmitted disease due to unprotected sex - Plan: POCT Wet Prep ([place]), NuSwab Vaginitis Plus (VG+), RPR, HIV Antibody (routine testing w rflx)  Wet prep showed few bacteria, clue cells. No yeast or trich Follow up pending Nuswab results. She will abstain from sexual activity while awaiting results.

## 2020-06-18 LAB — HIV ANTIBODY (ROUTINE TESTING W REFLEX): HIV Screen 4th Generation wRfx: NONREACTIVE

## 2020-06-18 LAB — RPR: RPR Ser Ql: NONREACTIVE

## 2020-06-21 ENCOUNTER — Encounter: Payer: Self-pay | Admitting: Family Medicine

## 2020-06-21 LAB — NUSWAB VAGINITIS PLUS (VG+)
BVAB 2: HIGH Score — AB
Candida albicans, NAA: NEGATIVE
Candida glabrata, NAA: NEGATIVE
Chlamydia trachomatis, NAA: NEGATIVE
Neisseria gonorrhoeae, NAA: NEGATIVE
Trich vag by NAA: NEGATIVE

## 2020-06-22 ENCOUNTER — Other Ambulatory Visit: Payer: Self-pay | Admitting: Family Medicine

## 2020-06-22 MED ORDER — METRONIDAZOLE 500 MG PO TABS: 500.0000 mg | ORAL_TABLET | Freq: Two times a day (BID) | ORAL | 0 refills | Status: DC

## 2020-06-24 ENCOUNTER — Other Ambulatory Visit: Payer: Self-pay | Admitting: Family Medicine

## 2020-06-24 DIAGNOSIS — I10 Essential (primary) hypertension: Secondary | ICD-10-CM

## 2020-06-24 DIAGNOSIS — E119 Type 2 diabetes mellitus without complications: Secondary | ICD-10-CM

## 2020-06-25 DIAGNOSIS — F319 Bipolar disorder, unspecified: Secondary | ICD-10-CM | POA: Diagnosis not present

## 2020-06-30 DIAGNOSIS — S8264XD Nondisplaced fracture of lateral malleolus of right fibula, subsequent encounter for closed fracture with routine healing: Secondary | ICD-10-CM | POA: Diagnosis not present

## 2020-07-01 DIAGNOSIS — F319 Bipolar disorder, unspecified: Secondary | ICD-10-CM | POA: Diagnosis not present

## 2020-07-08 DIAGNOSIS — F319 Bipolar disorder, unspecified: Secondary | ICD-10-CM | POA: Diagnosis not present

## 2020-07-14 MED ORDER — FLUCONAZOLE 150 MG PO TABS: 150.0000 mg | ORAL_TABLET | Freq: Once | ORAL | 0 refills | Status: AC

## 2020-07-23 DIAGNOSIS — F319 Bipolar disorder, unspecified: Secondary | ICD-10-CM | POA: Diagnosis not present

## 2020-07-30 DIAGNOSIS — F319 Bipolar disorder, unspecified: Secondary | ICD-10-CM | POA: Diagnosis not present

## 2020-08-04 DIAGNOSIS — F319 Bipolar disorder, unspecified: Secondary | ICD-10-CM | POA: Diagnosis not present

## 2020-08-12 DIAGNOSIS — F319 Bipolar disorder, unspecified: Secondary | ICD-10-CM | POA: Diagnosis not present

## 2020-08-20 ENCOUNTER — Encounter: Payer: Self-pay | Admitting: Family Medicine

## 2020-08-20 ENCOUNTER — Other Ambulatory Visit (INDEPENDENT_AMBULATORY_CARE_PROVIDER_SITE_OTHER): Payer: BC Managed Care – PPO

## 2020-08-20 ENCOUNTER — Telehealth (INDEPENDENT_AMBULATORY_CARE_PROVIDER_SITE_OTHER): Payer: BC Managed Care – PPO | Admitting: Family Medicine

## 2020-08-20 ENCOUNTER — Other Ambulatory Visit: Payer: Self-pay

## 2020-08-20 VITALS — Wt 160.0 lb

## 2020-08-20 DIAGNOSIS — F319 Bipolar disorder, unspecified: Secondary | ICD-10-CM | POA: Diagnosis not present

## 2020-08-20 DIAGNOSIS — R519 Headache, unspecified: Secondary | ICD-10-CM

## 2020-08-20 DIAGNOSIS — R197 Diarrhea, unspecified: Secondary | ICD-10-CM

## 2020-08-20 DIAGNOSIS — R52 Pain, unspecified: Secondary | ICD-10-CM

## 2020-08-20 DIAGNOSIS — R61 Generalized hyperhidrosis: Secondary | ICD-10-CM | POA: Diagnosis not present

## 2020-08-20 DIAGNOSIS — R1030 Lower abdominal pain, unspecified: Secondary | ICD-10-CM

## 2020-08-20 LAB — POC INFLUENZA A&B (BINAX/QUICKVUE)
Influenza A, POC: NEGATIVE
Influenza B, POC: NEGATIVE

## 2020-08-20 LAB — POC COVID19 BINAXNOW: SARS Coronavirus 2 Ag: NEGATIVE

## 2020-08-20 NOTE — Progress Notes (Addendum)
   Subjective:  Documentation for virtual audio and video telecommunications through [facility] encounter:  The patient was located at home. 2 patient identifiers used.  The provider was located in the office. The patient did consent to this visit and is aware of possible charges through their insurance for this visit.  The other persons participating in this telemedicine service were none. Time spent on call was 14 minutes and in review of previous records 20 minutes total.  This virtual service is not related to other E/M service within previous 7 days.   Patient ID: Deborah Haney, female    DOB: [birth year], 55 y.o.   MRN: [id number]  HPI Chief Complaint  Patient presents with   not feeling well    Not feeling well, body aches, not sleeping, for the last 4 days. Bs is 178   Complains of body aches for the past 4 days.  States she is just not feeling well. States she is not sleeping due to making "good changes". Looking for a new job.   Night sweats, feeling hot and having to get up and take a cold shower. Coughing at night for the past month.  Intermittent diarrhea for the past month. She had 4 days of it  Took probiotics, drank gatorade.  Hx of diverticulitis.   Lower abdomen pain for the past month. 1-2 times per week she wakes up with stomach pain. Aching, sharp and continuous at times.   States her breasts hurt for a week. This has resolved.  Has scheduled her breast exam.   Vision blurry and she saw her eye doctor. States she was diagnosed with dry eyes and is using eye drops.   She had Covid in 2020.   Negative Covid test at home per patient.   Denies dizziness, chest pain, palpitations, shortness of breath, abdominal pain, N/V, urinary symptoms, LE edema.      Review of Systems Pertinent positives and negatives in the history of present illness.     Objective:   Physical Exam Wt 160 lb (72.6 kg)   LMP 04/25/2011   BMI 25.82 kg/m   Alert and oriented  and in no acute distress.  Respirations unlabored.  Speaking in complete sentences without difficulty.  Normal speech, mood, memory      Assessment & Plan:  Body aches - Plan: POC COVID-19 BinaxNow, POC Influenza A&B(BINAX/QUICKVUE), Novel Coronavirus, NAA ([facility])  Acute nonintractable headache, unspecified headache type - Plan: POC COVID-19 BinaxNow, POC Influenza A&B(BINAX/QUICKVUE), Novel Coronavirus, NAA ([facility])  Night sweats - Plan: POC COVID-19 BinaxNow, Novel Coronavirus, NAA ([facility])  Diarrhea, unspecified type - Plan: POC COVID-19 BinaxNow, Novel Coronavirus, NAA ([facility])  Lower abdominal pain  She will come to the office parking lot for rapid COVID, flu and PCR COVID test.  If her tests are negative then she will come into the office tomorrow for an exam and other tests

## 2020-08-21 ENCOUNTER — Ambulatory Visit: Payer: BC Managed Care – PPO | Admitting: Family Medicine

## 2020-08-21 ENCOUNTER — Other Ambulatory Visit: Payer: Self-pay

## 2020-08-21 ENCOUNTER — Encounter: Payer: Self-pay | Admitting: Family Medicine

## 2020-08-21 VITALS — BP 120/80 | HR 74 | Temp 97.4°F | Resp 16 | Wt 182.8 lb

## 2020-08-21 DIAGNOSIS — M791 Myalgia, unspecified site: Secondary | ICD-10-CM | POA: Diagnosis not present

## 2020-08-21 DIAGNOSIS — E119 Type 2 diabetes mellitus without complications: Secondary | ICD-10-CM

## 2020-08-21 DIAGNOSIS — E559 Vitamin D deficiency, unspecified: Secondary | ICD-10-CM | POA: Diagnosis not present

## 2020-08-21 DIAGNOSIS — Z9189 Other specified personal risk factors, not elsewhere classified: Secondary | ICD-10-CM | POA: Diagnosis not present

## 2020-08-21 DIAGNOSIS — R197 Diarrhea, unspecified: Secondary | ICD-10-CM | POA: Diagnosis not present

## 2020-08-21 DIAGNOSIS — R10817 Generalized abdominal tenderness: Secondary | ICD-10-CM | POA: Diagnosis not present

## 2020-08-21 DIAGNOSIS — R52 Pain, unspecified: Secondary | ICD-10-CM

## 2020-08-21 DIAGNOSIS — R5383 Other fatigue: Secondary | ICD-10-CM

## 2020-08-21 DIAGNOSIS — R1030 Lower abdominal pain, unspecified: Secondary | ICD-10-CM | POA: Diagnosis not present

## 2020-08-21 LAB — POCT URINALYSIS DIP (PROADVANTAGE DEVICE)
Bilirubin, UA: NEGATIVE
Blood, UA: NEGATIVE
Glucose, UA: NEGATIVE mg/dL
Ketones, POC UA: NEGATIVE mg/dL
Leukocytes, UA: NEGATIVE
Nitrite, UA: NEGATIVE
Protein Ur, POC: NEGATIVE mg/dL
Specific Gravity, Urine: 1.015
Urobilinogen, Ur: NEGATIVE
pH, UA: 6 (ref 5.0–8.0)

## 2020-08-21 LAB — SARS-COV-2, NAA 2 DAY TAT

## 2020-08-21 LAB — NOVEL CORONAVIRUS, NAA: SARS-CoV-2, NAA: NOT DETECTED

## 2020-08-21 NOTE — Progress Notes (Signed)
   Subjective:    Patient ID: Deborah Haney, female    DOB: 06-27-1965, 55 y.o.   MRN: [id number]  HPI Chief Complaint  Patient presents with   sick    Sick, body aches, stomach pain for a month, headaches, diarrhea, tired, fatigue,    Complains of fatigue and body aches for the past 5 days. She had a negative rapid Covid and flu yesterday. PCR pending.   She has a 4 week history of diarrhea, watery and yellowish. She is having diarrhea one day per week for the entire day.  Reports hurting "all over". Lower abdominal pain, intermittent. Feels like an "ache" and then a "sharp pain".   Reports having intermittent blurred vision. She was checked by her eye doctor for this.   Blood sugar this morning was 136.   Hx of STD. Currently no vaginal discharge, odor or irritation.   Denies fever, chills, dizziness, chest pain, palpitations, shortness of breath, N/V urinary symptoms, LE edema.     Review of Systems Pertinent positives and negatives in the history of present illness.     Objective:   Physical Exam BP 120/80   Pulse 74   Temp (!) 97.4 F (36.3 C)   Resp 16   Wt 182 lb 12.8 oz (82.9 kg)   LMP 04/25/2011   BMI 29.50 kg/m   Alert and in no distress. Tympanic membranes and canals are normal. Pharyngeal area is normal. Neck is supple without adenopathy or thyromegaly. Cardiac exam shows a regular sinus rhythm without murmurs or gallops. Lungs are clear to auscultation.  Abdomen is soft, nondistended, generalized tenderness without rebound or guarding, normal bowel sounds, negative Murphy's and McBurney's, negative psoas.  Back exam shows tenderness to palpation throughout.  Extremities without edema.  Skin is warm and dry.  Normal speech, mood, memory and behavior.       Assessment & Plan:  Diarrhea, unspecified type - Plan: CBC with Differential/Platelet, Comprehensive metabolic panel, Cdiff NAA+O+P+Stool Culture, Cdiff NAA+O+P+Stool Culture -No obvious explanation.  No  sign of a acute infectious process.  Recommend staying well-hydrated.  I will order stool studies and check labs.  She is up-to-date on her colonoscopy.  Body aches - Plan: Sedimentation rate, Vitamin B12, Magnesium, C-reactive protein -Rapid flu and COVID test negative.  PCR pending.  She is quite tender throughout on her exam.  Check labs and follow-up.  She may take Tylenol for pain  Lower abdominal pain - Plan: POCT Urinalysis DIP (Proadvantage Device), NuSwab Vaginitis Plus (VG+) -Urinalysis dipstick negative.  I will screen for STDs and she did a self swab.  Follow-up pending results  Fatigue, unspecified type - Plan: CBC with Differential/Platelet, Comprehensive metabolic panel, TSH, T4, free, VITAMIN D 25 Hydroxy (Vit-D Deficiency, Fractures), Vitamin B12, Iron, TIBC and Ferritin Panel, C-reactive protein -Unclear etiology.  This may be due to a viral process.  Check labs and follow-up  Generalized abdominal tenderness without rebound tenderness - Plan: CBC with Differential/Platelet, Comprehensive metabolic panel, Lipase -No focal tenderness.  Check labs and follow-up  Controlled type 2 diabetes mellitus without complication, without long-term current use of insulin ([place]) - Plan: TSH, T4, free  Vitamin D deficiency - Plan: VITAMIN D 25 Hydroxy (Vit-D Deficiency, Fractures)  Myalgia - Plan: Sedimentation rate, Vitamin B12, Magnesium, C-reactive protein  At risk for sexually transmitted disease due to unprotected sex - Plan: HIV Antibody (routine testing w rflx), Hepatitis C antibody, RPR, NuSwab Vaginitis Plus (VG+)

## 2020-08-22 LAB — CBC WITH DIFFERENTIAL/PLATELET
Basophils Absolute: 0 10*3/uL (ref 0.0–0.2)
Basos: 1 %
EOS (ABSOLUTE): 0.1 10*3/uL (ref 0.0–0.4)
Eos: 1 %
Hematocrit: 38.6 % (ref 34.0–46.6)
Hemoglobin: 13.1 g/dL (ref 11.1–15.9)
Immature Grans (Abs): 0 10*3/uL (ref 0.0–0.1)
Immature Granulocytes: 0 %
Lymphocytes Absolute: 1.9 10*3/uL (ref 0.7–3.1)
Lymphs: 35 %
MCH: 29.2 pg (ref 26.6–33.0)
MCHC: 33.9 g/dL (ref 31.5–35.7)
MCV: 86 fL (ref 79–97)
Monocytes Absolute: 0.2 10*3/uL (ref 0.1–0.9)
Monocytes: 4 %
Neutrophils Absolute: 3.2 10*3/uL (ref 1.4–7.0)
Neutrophils: 59 %
Platelets: 303 10*3/uL (ref 150–450)
RBC: 4.48 x10E6/uL (ref 3.77–5.28)
RDW: 13.5 % (ref 11.7–15.4)
WBC: 5.4 10*3/uL (ref 3.4–10.8)

## 2020-08-22 LAB — IRON,TIBC AND FERRITIN PANEL
Ferritin: 42 ng/mL (ref 15–150)
Iron Saturation: 23 % (ref 15–55)
Iron: 77 ug/dL (ref 27–159)
Total Iron Binding Capacity: 334 ug/dL (ref 250–450)
UIBC: 257 ug/dL (ref 131–425)

## 2020-08-22 LAB — COMPREHENSIVE METABOLIC PANEL
ALT: 13 IU/L (ref 0–32)
AST: 13 IU/L (ref 0–40)
Albumin/Globulin Ratio: 1.4 (ref 1.2–2.2)
Albumin: 4.5 g/dL (ref 3.8–4.9)
Alkaline Phosphatase: 65 IU/L (ref 44–121)
BUN/Creatinine Ratio: 10 (ref 9–23)
BUN: 9 mg/dL (ref 6–24)
Bilirubin Total: 0.8 mg/dL (ref 0.0–1.2)
CO2: 21 mmol/L (ref 20–29)
Calcium: 9.4 mg/dL (ref 8.7–10.2)
Chloride: 101 mmol/L (ref 96–106)
Creatinine, Ser: 0.92 mg/dL (ref 0.57–1.00)
Globulin, Total: 3.3 g/dL (ref 1.5–4.5)
Glucose: 171 mg/dL — ABNORMAL HIGH (ref 65–99)
Potassium: 3.8 mmol/L (ref 3.5–5.2)
Sodium: 137 mmol/L (ref 134–144)
Total Protein: 7.8 g/dL (ref 6.0–8.5)
eGFR: 75 mL/min/{1.73_m2} (ref >59)

## 2020-08-22 LAB — TSH: TSH: 0.828 u[IU]/mL (ref 0.450–4.500)

## 2020-08-22 LAB — VITAMIN D 25 HYDROXY (VIT D DEFICIENCY, FRACTURES): Vit D, 25-Hydroxy: 30.9 ng/mL (ref 30.0–100.0)

## 2020-08-22 LAB — MAGNESIUM: Magnesium: 2 mg/dL (ref 1.6–2.3)

## 2020-08-22 LAB — HIV ANTIBODY (ROUTINE TESTING W REFLEX): HIV Screen 4th Generation wRfx: NONREACTIVE

## 2020-08-22 LAB — RPR: RPR Ser Ql: NONREACTIVE

## 2020-08-22 LAB — SEDIMENTATION RATE: Sed Rate: 30 mm/hr (ref 0–40)

## 2020-08-22 LAB — LIPASE: Lipase: 31 U/L (ref 14–72)

## 2020-08-22 LAB — HEPATITIS C ANTIBODY: Hep C Virus Ab: <0.1 s/co ratio (ref 0.0–0.9)

## 2020-08-22 LAB — VITAMIN B12: Vitamin B-12: 468 pg/mL (ref 232–1245)

## 2020-08-22 LAB — C-REACTIVE PROTEIN: CRP: 3 mg/L (ref 0–10)

## 2020-08-22 LAB — T4, FREE: Free T4: 1.07 ng/dL (ref 0.82–1.77)

## 2020-08-24 ENCOUNTER — Other Ambulatory Visit: Payer: BC Managed Care – PPO

## 2020-08-24 ENCOUNTER — Encounter: Payer: Self-pay | Admitting: Family Medicine

## 2020-08-24 ENCOUNTER — Other Ambulatory Visit: Payer: Self-pay

## 2020-08-24 DIAGNOSIS — R197 Diarrhea, unspecified: Secondary | ICD-10-CM | POA: Diagnosis not present

## 2020-08-25 ENCOUNTER — Other Ambulatory Visit: Payer: Self-pay | Admitting: Family Medicine

## 2020-08-25 DIAGNOSIS — A5901 Trichomonal vulvovaginitis: Secondary | ICD-10-CM

## 2020-08-25 DIAGNOSIS — A0472 Enterocolitis due to Clostridium difficile, not specified as recurrent: Secondary | ICD-10-CM

## 2020-08-25 LAB — NUSWAB VAGINITIS PLUS (VG+)
Candida albicans, NAA: POSITIVE — AB
Candida glabrata, NAA: NEGATIVE
Chlamydia trachomatis, NAA: NEGATIVE
Neisseria gonorrhoeae, NAA: NEGATIVE
Trich vag by NAA: POSITIVE — AB

## 2020-08-25 MED ORDER — VANCOMYCIN HCL 125 MG PO CAPS: 125.0000 mg | ORAL_CAPSULE | Freq: Four times a day (QID) | ORAL | 0 refills | Status: AC

## 2020-08-25 MED ORDER — METRONIDAZOLE 500 MG PO TABS: 500.0000 mg | ORAL_TABLET | Freq: Two times a day (BID) | ORAL | 0 refills | Status: DC

## 2020-08-25 MED ORDER — FLUCONAZOLE 150 MG PO TABS: 150.0000 mg | ORAL_TABLET | Freq: Once | ORAL | 0 refills | Status: AC

## 2020-08-25 NOTE — Progress Notes (Signed)
Please let her know that she is positive for trichomonas. I will send in a prescription and she should tell her partner to get treated. She should avoid sexual activity for at least one week.  I will also send in a diflucan to treat for yeast, she did have some on the swab.

## 2020-08-26 ENCOUNTER — Encounter: Payer: Self-pay | Admitting: Family Medicine

## 2020-08-26 DIAGNOSIS — F319 Bipolar disorder, unspecified: Secondary | ICD-10-CM | POA: Diagnosis not present

## 2020-08-27 ENCOUNTER — Encounter: Payer: Self-pay | Admitting: Family Medicine

## 2020-08-28 LAB — CDIFF NAA+O+P+STOOL CULTURE
E coli, Shiga toxin Assay: NEGATIVE
Toxigenic C. Difficile by PCR: POSITIVE — AB

## 2020-09-02 DIAGNOSIS — F319 Bipolar disorder, unspecified: Secondary | ICD-10-CM | POA: Diagnosis not present

## 2020-09-04 ENCOUNTER — Telehealth: Payer: Self-pay

## 2020-09-04 NOTE — Telephone Encounter (Signed)
Pt ed for a PCR test . Pt was advised since home test was negative she should wait and test again. Exposure has been two days ago. Pt was also advise if she is still concerned next week to call for a virtual and PCR test. [facility]

## 2020-09-09 DIAGNOSIS — F319 Bipolar disorder, unspecified: Secondary | ICD-10-CM | POA: Diagnosis not present

## 2020-09-15 DIAGNOSIS — R197 Diarrhea, unspecified: Secondary | ICD-10-CM | POA: Diagnosis not present

## 2020-09-16 ENCOUNTER — Other Ambulatory Visit: Payer: Self-pay | Admitting: Family Medicine

## 2020-09-16 ENCOUNTER — Encounter: Payer: Self-pay | Admitting: Family Medicine

## 2020-09-16 DIAGNOSIS — Z1231 Encounter for screening mammogram for malignant neoplasm of breast: Secondary | ICD-10-CM

## 2020-09-16 DIAGNOSIS — F319 Bipolar disorder, unspecified: Secondary | ICD-10-CM | POA: Diagnosis not present

## 2020-09-21 ENCOUNTER — Other Ambulatory Visit: Payer: Self-pay

## 2020-09-21 ENCOUNTER — Ambulatory Visit (INDEPENDENT_AMBULATORY_CARE_PROVIDER_SITE_OTHER): Payer: BC Managed Care – PPO | Admitting: Medical

## 2020-09-21 ENCOUNTER — Other Ambulatory Visit: Payer: Self-pay | Admitting: Medical

## 2020-09-21 VITALS — BP 124/82 | HR 75 | Temp 98.3°F | Wt 188.2 lb

## 2020-09-21 DIAGNOSIS — N644 Mastodynia: Secondary | ICD-10-CM | POA: Diagnosis not present

## 2020-09-21 DIAGNOSIS — E119 Type 2 diabetes mellitus without complications: Secondary | ICD-10-CM

## 2020-09-21 LAB — CDIFF NAA+O+P+STOOL CULTURE
E coli, Shiga toxin Assay: NEGATIVE
Toxigenic C. Difficile by PCR: NEGATIVE

## 2020-09-21 LAB — POCT GLYCOSYLATED HEMOGLOBIN (HGB A1C): Hemoglobin A1C: 6.9 % — AB (ref 4.0–5.6)

## 2020-09-21 NOTE — Progress Notes (Signed)
Subjective:  Deborah Haney is a 55 y.o. female who presents for Chief Complaint  Patient presents with   Other    Breast tenderness both are sore and doesn't look right, once a month for last five months.      Here for breast tenderness.both breasts have been tender, and seeing some marks/bumps on right breast.  No nipple discharge.  No fever, no chills.  Does get some body aches.  She has hx/o hysterectomy but has ovaries.    No vaginal bleeding, but has hx/o vaginal dryness.   No injury or trauma to breasts.   Not good about monthly breast exams.   She denies lump or mass, just achy.   She does drink a lot of caffeine, at least 1-2 cups of coffee daily.  Started probiotic  Mom had lumpectomy but doesn't think it was cancer.  Is diabetic.  No recent polyuria, polydipsia, blurred vision.  Not checking glucose very often.  Is diet controlled.    Was seen here back in June for illness, c. Diff infection.   Was on 2 different antibiotics at one point, and has been using probiotics.  Still has some loose stool but mostly improved.  No other aggravating or relieving factors.    No other c/o.  Past Medical History:  Diagnosis Date   Acute cholecystitis 07/14/2013   Acute on chronic respiratory failure with hypoxia ([place]) 09/05/2018   Anemia    has history, takes iron   Anxiety    Bipolar affective disorder ([place]) 10/26/2015   Has seen counselor, declined medications per medical record from [facility]. hypermanic   Chest pain 07/14/2013   Chronic vaginitis 10/26/2015   Has h/o vag dryness and tried Estrace per [place] records   COVID-19 virus infection 08/24/2018   Depression    Diabetes ([place])    Patient denies   Diverticulitis    no problems   Dysfunctional uterine bleeding 03/02/2011   Elevated hemoglobin A1c 10/26/2015   GERD (gastroesophageal reflux disease)    tums prn   Headache(784.0)    Hemorrhoids    HTN (hypertension)    BP meds since fall 2016   Internal hemorrhoids    Nausea &  vomiting 07/14/2013   Obesity 10/26/2015   Rectal bleeding 03/15/2017   Family History  Problem Relation Age of Onset   Diabetes Mother    Hypertension Mother    Arthritis Father 28   Hypertension Brother    Cancer Brother        blood    Cancer Brother        unsure kind, as a child    CVA Other    Colon cancer Neg Hx    Colon polyps Neg Hx    Esophageal cancer Neg Hx    Rectal cancer Neg Hx    Stomach cancer Neg Hx    Pancreatic cancer Neg Hx      The following portions of the patient's history were reviewed and updated as appropriate: allergies, current medications, past family history, past medical history, past social history, past surgical history and problem list.  ROS Otherwise as in subjective above  Objective: BP 124/82   Pulse 75   Temp 98.3 F (36.8 C)   Wt 188 lb 3.2 oz (85.4 kg)   LMP 04/25/2011   BMI 30.38 kg/m   General appearance: alert, no distress, well developed, well nourished, well appearing, African American female Breast: no obvious mass or lump, no lymphadenopathy, scattered flat skin stages  of bilat breasts and chest wall, she is tender in bilat breast in mid to lower breasts bilat, no other lesion.  Exam chaperoned by nurse. No supraclavicular adenopathy Pulses: 2+ radial pulses, 2+ pedal pulses, normal cap refill Ext: no edema   Assessment: Encounter Diagnoses  Name Primary?   Breast tenderness in female Yes   Controlled type 2 diabetes mellitus without complication, without long-term current use of insulin ([place])      Plan: Breast tenderness - discussed possible causes. I reviewed the extensive lab test she had done in April.  Tender on exam today without mass.  Referral for diagnostic bilat mammogram  Diabetes- diet controlled.  Lab today.  C/t efforts at health low sugar diet, exercise  Sally-Anne was seen today for other.  Diagnoses and all orders for this visit:  Breast tenderness in female -     MM Digital Diagnostic Bilat;  Future  Controlled type 2 diabetes mellitus without complication, without long-term current use of insulin (HCC) -     HgB A1c   Follow up: pending mammogram

## 2020-09-28 ENCOUNTER — Telehealth: Payer: Self-pay | Admitting: Family Medicine

## 2020-09-28 ENCOUNTER — Other Ambulatory Visit: Payer: Self-pay

## 2020-09-28 ENCOUNTER — Encounter: Payer: Self-pay | Admitting: Family Medicine

## 2020-09-28 ENCOUNTER — Ambulatory Visit: Payer: BC Managed Care – PPO | Admitting: Family Medicine

## 2020-09-28 VITALS — BP 120/76 | HR 83 | Temp 98.6°F | Wt 190.2 lb

## 2020-09-28 DIAGNOSIS — H60502 Unspecified acute noninfective otitis externa, left ear: Secondary | ICD-10-CM | POA: Diagnosis not present

## 2020-09-28 MED ORDER — COLY-MYCIN S 3.3-3-10-0.5 MG/ML OT SUSP: 2.0000 [drp] | Freq: Three times a day (TID) | OTIC | 0 refills | Status: DC

## 2020-09-28 NOTE — Progress Notes (Signed)
   Subjective:    Patient ID: Deborah Haney, female    DOB: 04-21-1965, 55 y.o.   MRN: [id number]  HPI For the last several months she has been having difficulty with itching in her ear and has been using Q-tips.  In the last week or so she has noted some discomfort in her left ear especially when she pulls on it.  No drainage is noted.   Review of Systems     Objective:   Physical Exam Exam of the left external ear does show some chronic changes at the meatus.  Internal canal is slightly erythematous and painful to manipulate.  TM difficult to see due to cerumen.  Right TM and canal is normal.       Assessment & Plan:  Acute otitis externa of left ear, unspecified type - Plan: neomycin-colistin-hydrocortisone-thonzonium (COLY-MYCIN S) 3.05-07-08-0.5 MG/ML OTIC suspension Recommend an antibiotic to quiet this down topically.  Also recommend she use Cortisporin to the external canal to help with her itching.  Explained that the ear canal itself should not be causing difficulty with itching.  She will return here if she has problems.

## 2020-09-28 NOTE — Telephone Encounter (Signed)
Pt called and states that the ear drops that was sent over was discontinued at the pharmacy, she wants to know if you will send something else over Pt uses [place], [place]

## 2020-09-30 DIAGNOSIS — F319 Bipolar disorder, unspecified: Secondary | ICD-10-CM | POA: Diagnosis not present

## 2020-10-07 DIAGNOSIS — F319 Bipolar disorder, unspecified: Secondary | ICD-10-CM | POA: Diagnosis not present

## 2020-10-09 ENCOUNTER — Ambulatory Visit: Payer: BC Managed Care – PPO

## 2020-10-09 ENCOUNTER — Ambulatory Visit
Admission: RE | Admit: 2020-10-09 | Discharge: 2020-10-09 | Disposition: A | Discharge status: Home / Self Care | Payer: BC Managed Care – PPO | Source: Ambulatory Visit | Attending: Medical | Admitting: Medical

## 2020-10-09 ENCOUNTER — Other Ambulatory Visit: Payer: Self-pay

## 2020-10-09 DIAGNOSIS — N644 Mastodynia: Secondary | ICD-10-CM | POA: Diagnosis not present

## 2020-10-09 DIAGNOSIS — R922 Inconclusive mammogram: Secondary | ICD-10-CM | POA: Diagnosis not present

## 2020-10-14 DIAGNOSIS — F319 Bipolar disorder, unspecified: Secondary | ICD-10-CM | POA: Diagnosis not present

## 2020-10-21 DIAGNOSIS — F319 Bipolar disorder, unspecified: Secondary | ICD-10-CM | POA: Diagnosis not present

## 2020-10-26 ENCOUNTER — Other Ambulatory Visit: Payer: Self-pay | Admitting: Family Medicine

## 2020-10-26 DIAGNOSIS — E782 Mixed hyperlipidemia: Secondary | ICD-10-CM

## 2020-10-28 DIAGNOSIS — F319 Bipolar disorder, unspecified: Secondary | ICD-10-CM | POA: Diagnosis not present

## 2020-11-04 DIAGNOSIS — F319 Bipolar disorder, unspecified: Secondary | ICD-10-CM | POA: Diagnosis not present

## 2020-11-11 ENCOUNTER — Encounter: Payer: Self-pay | Admitting: Family Medicine

## 2020-11-11 ENCOUNTER — Other Ambulatory Visit: Payer: Self-pay

## 2020-11-11 ENCOUNTER — Ambulatory Visit: Payer: BC Managed Care – PPO | Admitting: Family Medicine

## 2020-11-11 VITALS — BP 124/78 | HR 68 | Temp 98.1°F | Wt 195.2 lb

## 2020-11-11 DIAGNOSIS — F319 Bipolar disorder, unspecified: Secondary | ICD-10-CM | POA: Diagnosis not present

## 2020-11-11 DIAGNOSIS — K625 Hemorrhage of anus and rectum: Secondary | ICD-10-CM | POA: Diagnosis not present

## 2020-11-11 DIAGNOSIS — Z8719 Personal history of other diseases of the digestive system: Secondary | ICD-10-CM | POA: Diagnosis not present

## 2020-11-11 DIAGNOSIS — M799 Soft tissue disorder, unspecified: Secondary | ICD-10-CM | POA: Diagnosis not present

## 2020-11-11 MED ORDER — VALACYCLOVIR HCL 500 MG PO TABS: 500.0000 mg | ORAL_TABLET | Freq: Two times a day (BID) | ORAL | 0 refills | Status: DC

## 2020-11-11 NOTE — Progress Notes (Signed)
Subjective:    Patient ID: Deborah Haney, female    DOB: [birth year], 55 y.o.   MRN: [id number]  HPI Chief Complaint  Patient presents with   other    Blood in stool to being constipated. Also diarrhea, rash on rt. Check on face started about 2 weeks ago    Here with complaints of a 3 week history of bright red blood with bowel movements over the past 1/2 weeks.  Blood on the stool and on tissue with history of internal hemorrhoids and banding.  Stools are more loose now. Stool consistency has changed.   Drinking more water (alkaline).  Taking probiotics.  Diet fairly high in fiber.   No fever, chills, fatigue, night sweats, chest pain, palpitations, shortness of breath, cough, abdominal pain, vomiting.  Only 1 day with nausea.   Diagnosed and treated for C-diff in June 2022 Colonoscopy 3 years ago.   At the end of the visit she reports having sores on her pubic area and thinks they may be related to folliculitis. Using Dial soap. Reports they are intermittently present. Reports hx of herpes.   Reviewed allergies, medications, past medical, surgical, family, and social history.   Review of Systems Pertinent positives and negatives in the history of present illness.     Objective:   Physical Exam Constitutional:      General: She is not in acute distress.    Appearance: Normal appearance. She is not ill-appearing.  Eyes:     Conjunctiva/sclera: Conjunctivae normal.     Pupils: Pupils are equal, round, and reactive to light.  Cardiovascular:     Rate and Rhythm: Normal rate and regular rhythm.     Pulses: Normal pulses.     Heart sounds: Normal heart sounds.  Pulmonary:     Effort: Pulmonary effort is normal.     Breath sounds: Normal breath sounds.  Abdominal:     General: Abdomen is flat. Bowel sounds are normal. There is no distension.     Palpations: Abdomen is soft.     Tenderness: There is no abdominal tenderness. There is no guarding.  Genitourinary:     Labia:        Right: Lesion present.      Comments: Raised erythematous lesion on the right side of mons pubis. No drainage or fluctuance.  Musculoskeletal:     Cervical back: Normal range of motion and neck supple.  Skin:    General: Skin is warm and dry.  Neurological:     General: No focal deficit present.     Mental Status: She is alert and oriented to person, place, and time.  Psychiatric:        Mood and Affect: Mood normal.        Behavior: Behavior normal.        Thought Content: Thought content normal.   BP 124/78   Pulse 68   Temp 98.1 F (36.7 C)   Wt 195 lb 3.2 oz (88.5 kg)   LMP 04/25/2011   BMI 31.51 kg/m       Assessment & Plan:  BRBPR (bright red blood per rectum) - Plan: CBC with Differential/Platelet, Comprehensive metabolic panel  History of hemorrhoids  Soft tissue lesion of pelvic region - Plan: valACYclovir (VALTREX) 500 MG tablet  Exam is benign. No sign of acute infection. No red flag symptoms. Advised to continue probiotic and add Benefiber or some sort of fiber supplement. She will call and schedule with her GI. Follow up  here if symptoms are worsening and unable to get an appointment. Follow up pending labs.  She will try valacyclovir. Return if lesions are oozing for viral culture. Continue with Dial soap. Discussed contagious nature if this is herpes.

## 2020-11-12 LAB — CBC WITH DIFFERENTIAL/PLATELET
Basophils Absolute: 0 10*3/uL (ref 0.0–0.2)
Basos: 1 %
EOS (ABSOLUTE): 0.1 10*3/uL (ref 0.0–0.4)
Eos: 2 %
Hematocrit: 37.6 % (ref 34.0–46.6)
Hemoglobin: 12.5 g/dL (ref 11.1–15.9)
Immature Grans (Abs): 0 10*3/uL (ref 0.0–0.1)
Immature Granulocytes: 0 %
Lymphocytes Absolute: 3 10*3/uL (ref 0.7–3.1)
Lymphs: 41 %
MCH: 29.4 pg (ref 26.6–33.0)
MCHC: 33.2 g/dL (ref 31.5–35.7)
MCV: 89 fL (ref 79–97)
Monocytes Absolute: 0.4 10*3/uL (ref 0.1–0.9)
Monocytes: 5 %
Neutrophils Absolute: 3.7 10*3/uL (ref 1.4–7.0)
Neutrophils: 51 %
Platelets: 256 10*3/uL (ref 150–450)
RBC: 4.25 x10E6/uL (ref 3.77–5.28)
RDW: 14.3 % (ref 11.7–15.4)
WBC: 7.3 10*3/uL (ref 3.4–10.8)

## 2020-11-12 LAB — COMPREHENSIVE METABOLIC PANEL
ALT: 30 IU/L (ref 0–32)
AST: 34 IU/L (ref 0–40)
Albumin/Globulin Ratio: 1.2 (ref 1.2–2.2)
Albumin: 4.1 g/dL (ref 3.8–4.9)
Alkaline Phosphatase: 63 IU/L (ref 44–121)
BUN/Creatinine Ratio: 15 (ref 9–23)
BUN: 12 mg/dL (ref 6–24)
Bilirubin Total: 0.6 mg/dL (ref 0.0–1.2)
CO2: 20 mmol/L (ref 20–29)
Calcium: 9.2 mg/dL (ref 8.7–10.2)
Chloride: 101 mmol/L (ref 96–106)
Creatinine, Ser: 0.78 mg/dL (ref 0.57–1.00)
Globulin, Total: 3.5 g/dL (ref 1.5–4.5)
Glucose: 147 mg/dL — ABNORMAL HIGH (ref 65–99)
Potassium: 4.8 mmol/L (ref 3.5–5.2)
Sodium: 139 mmol/L (ref 134–144)
Total Protein: 7.6 g/dL (ref 6.0–8.5)
eGFR: 91 mL/min/{1.73_m2} (ref >59)

## 2020-11-18 DIAGNOSIS — F319 Bipolar disorder, unspecified: Secondary | ICD-10-CM | POA: Diagnosis not present

## 2020-11-25 DIAGNOSIS — F319 Bipolar disorder, unspecified: Secondary | ICD-10-CM | POA: Diagnosis not present

## 2020-12-02 DIAGNOSIS — F319 Bipolar disorder, unspecified: Secondary | ICD-10-CM | POA: Diagnosis not present

## 2020-12-09 DIAGNOSIS — F319 Bipolar disorder, unspecified: Secondary | ICD-10-CM | POA: Diagnosis not present

## 2020-12-16 DIAGNOSIS — F319 Bipolar disorder, unspecified: Secondary | ICD-10-CM | POA: Diagnosis not present

## 2020-12-17 ENCOUNTER — Telehealth: Payer: Self-pay

## 2020-12-17 ENCOUNTER — Ambulatory Visit: Payer: BC Managed Care – PPO | Admitting: Medical

## 2020-12-17 ENCOUNTER — Other Ambulatory Visit: Payer: Self-pay

## 2020-12-17 VITALS — BP 130/88 | HR 76 | Wt 201.4 lb

## 2020-12-17 DIAGNOSIS — Z136 Encounter for screening for cardiovascular disorders: Secondary | ICD-10-CM | POA: Diagnosis not present

## 2020-12-17 DIAGNOSIS — I10 Essential (primary) hypertension: Secondary | ICD-10-CM

## 2020-12-17 DIAGNOSIS — R739 Hyperglycemia, unspecified: Secondary | ICD-10-CM | POA: Diagnosis not present

## 2020-12-17 DIAGNOSIS — Z0001 Encounter for general adult medical examination with abnormal findings: Secondary | ICD-10-CM | POA: Insufficient documentation

## 2020-12-17 DIAGNOSIS — E1165 Type 2 diabetes mellitus with hyperglycemia: Secondary | ICD-10-CM

## 2020-12-17 DIAGNOSIS — E782 Mixed hyperlipidemia: Secondary | ICD-10-CM | POA: Insufficient documentation

## 2020-12-17 LAB — POCT GLYCOSYLATED HEMOGLOBIN (HGB A1C): Hemoglobin A1C: 6.5 % — AB (ref 4.0–5.6)

## 2020-12-17 LAB — GLUCOSE, POCT (MANUAL RESULT ENTRY): POC Glucose: 88 mg/dl (ref 70–99)

## 2020-12-17 MED ORDER — OZEMPIC (0.25 OR 0.5 MG/DOSE) 2 MG/1.5ML ~~LOC~~ SOPN: 0.5000 mg | PEN_INJECTOR | SUBCUTANEOUS | 5 refills | Status: DC

## 2020-12-17 MED ORDER — LISINOPRIL-HYDROCHLOROTHIAZIDE 10-12.5 MG PO TABS: 1.0000 | ORAL_TABLET | Freq: Every day | ORAL | 3 refills | Status: DC

## 2020-12-17 NOTE — Telephone Encounter (Signed)
Patient called and sugar is 511.  Is currently at work , she has been chocolate and drinking coffee.   Had blurred vision yesterday and day before    Needs a new glucometer bc she lost hers to [place].  NO SHOB  Has been very hot today which is unusual for her. Woke up with feet tingling and hands earlier this week  Confirmed she is not currently taking any medication

## 2020-12-17 NOTE — Telephone Encounter (Signed)
Pt notified, scheduled this afternoon with Audelia Acton

## 2020-12-17 NOTE — Addendum Note (Signed)
Addended by: Minette Headland A on: 12/17/2020 04:36 PM   Modules accepted: Orders

## 2020-12-17 NOTE — Progress Notes (Signed)
Subjective:  Deborah Haney is a 55 y.o. female who presents for Chief Complaint  Patient presents with   elevated BS    BS elevated about an hour ago at 511. Today in office its 88-90.      Here for several concerns.  She was seeing Harland Dingwall  NP prior here before she left recently.  Here for blood sugar concerns. Hasn't been checking sugars regularly.  Lost meter in July.  Was in nurse office at work today, wasn't feeling well, felt dizzy.  Nurse did sugar and they noted sugar 511.  They had her drink 4 bottles of water and rest, advised to come here.  She ate a large serving of peppermint taffy before the reading including tootsie rolls, coffee, snacking all day.  She was on Ozempic prior.  Has been off of this for a few months.  She would be agreeable to going back on this but does not want to go on other medicines for diabetes or insulin.  Every now and then she gets pins-and-needles feelings in her feet  Hyperlipidemia-he takes statin daily but she does get quite a bit of muscle aches that she attributes towards the statin.  She has been on this a few years.  Hypertension-compliant with medication.  No family history of heart disease.    She has not a smoker.  She denies heavy alcohol use.  She denies polydipsia, no polyuria, no blurred vision, no recent weight change  No other aggravating or relieving factors.    No other c/o.  Past Medical History:  Diagnosis Date   Acute cholecystitis 07/14/2013   Acute on chronic respiratory failure with hypoxia ([place]) 09/05/2018   Anemia    has history, takes iron   Anxiety    Bipolar affective disorder ([place]) 10/26/2015   Has seen counselor, declined medications per medical record from [facility]. hypermanic   Chest pain 07/14/2013   Chronic vaginitis 10/26/2015   Has h/o vag dryness and tried Estrace per [place] records   COVID-19 virus infection 08/24/2018   Depression    Diabetes ([place])    Patient denies   Diverticulitis    no problems    Dysfunctional uterine bleeding 03/02/2011   Elevated hemoglobin A1c 10/26/2015   GERD (gastroesophageal reflux disease)    tums prn   Headache(784.0)    Hemorrhoids    HTN (hypertension)    BP meds since fall 2016   Internal hemorrhoids    Nausea & vomiting 07/14/2013   Obesity 10/26/2015   Rectal bleeding 03/15/2017   Family History  Problem Relation Age of Onset   Diabetes Mother    Hypertension Mother    Arthritis Father 20   Hypertension Brother    Cancer Brother        blood    Cancer Brother        unsure kind, as a child    CVA Other    Colon cancer Neg Hx    Colon polyps Neg Hx    Esophageal cancer Neg Hx    Rectal cancer Neg Hx    Stomach cancer Neg Hx    Pancreatic cancer Neg Hx      The following portions of the patient's history were reviewed and updated as appropriate: allergies, current medications, past family history, past medical history, past social history, past surgical history and problem list.  ROS Otherwise as in subjective above  Objective: BP 130/88   Pulse 76   Wt 201 lb 6.4 oz (  91.4 kg)   LMP 04/25/2011   BMI 32.51 kg/m    General appearance: alert, no distress, well developed, well nourished     Assessment: Encounter Diagnoses  Name Primary?   Type 2 diabetes mellitus with hyperglycemia, without long-term current use of insulin (HCC) Yes   Elevated blood sugar    Essential hypertension    Screening for heart disease    Mixed dyslipidemia      Plan: We discussed her blood sugars.  Looking back through her records she has denied a diagnosis of diabetes prior although the numbers have been borderline.  We discussed the different criteria for diabetes.  She has had hemoglobin A1c's over 6.5%.  We discussed the simple fact of the blood sugar reading she got earlier today would be a criteria met as well.   Thus, we discussed that she does have diabetes.  We were going back on Ozempic which would be a good choice especially given the  postprandial numbers.  She is eating a lot of sweets throughout the day particular candy.  We discussed diet.  She is agreeable to nutrition consult.   Hyperlipidemia - reviewing back in records, lipids didn't look good 3 years ago.  Since being on statin, good control of lipids. However, having myalgias.   We will pursue CT coronary.    She has 3 risk factors.    She will consider temporarily stopping Lipitor for 2 weeks to see if myalag resolve.   Consider alternative like Crestor, or 3 days per week dosing or other remedies  HTN - continue BP medicaiton as usual  Screening for heart disease-referral for CT coronary calcium score.  I spent a lot of time discussing diagnoses, trying to prevent complications, reasoning for the medicine she is taking.  Leean was seen today for elevated bs.  Diagnoses and all orders for this visit:  Type 2 diabetes mellitus with hyperglycemia, without long-term current use of insulin (HCC)  Elevated blood sugar -     HgB A1c -     POCT glucose (manual entry) -     CT CARDIAC SCORING ([facility] LOCATIONS ONLY); Future  Essential hypertension -     lisinopril-hydrochlorothiazide (ZESTORETIC) 10-12.5 MG tablet; Take 1 tablet by mouth daily. -     CT CARDIAC SCORING ([facility] LOCATIONS ONLY); Future  Screening for heart disease -     CT CARDIAC SCORING ([facility] LOCATIONS ONLY); Future  Mixed dyslipidemia  Other orders -     Semaglutide,0.25 or 0.5MG/DOS, (OZEMPIC, 0.25 OR 0.5 MG/DOSE,) 2 MG/1.5ML SOPN; Inject 0.5 mg into the skin once a week.  Spent > 45 minutes face to face with patient in discussion of symptoms, evaluation, plan and recommendations.    Follow up:2-2mo

## 2020-12-23 ENCOUNTER — Encounter: Payer: Self-pay | Admitting: Family Medicine

## 2020-12-23 ENCOUNTER — Telehealth: Payer: Self-pay

## 2020-12-23 ENCOUNTER — Other Ambulatory Visit: Payer: Self-pay

## 2020-12-23 ENCOUNTER — Ambulatory Visit: Payer: BC Managed Care – PPO | Admitting: Family Medicine

## 2020-12-23 VITALS — BP 142/90 | HR 80 | Temp 98.2°F | Ht 66.0 in | Wt 200.6 lb

## 2020-12-23 DIAGNOSIS — F319 Bipolar disorder, unspecified: Secondary | ICD-10-CM | POA: Diagnosis not present

## 2020-12-23 DIAGNOSIS — N951 Menopausal and female climacteric states: Secondary | ICD-10-CM

## 2020-12-23 DIAGNOSIS — I10 Essential (primary) hypertension: Secondary | ICD-10-CM

## 2020-12-23 DIAGNOSIS — R300 Dysuria: Secondary | ICD-10-CM

## 2020-12-23 DIAGNOSIS — N3000 Acute cystitis without hematuria: Secondary | ICD-10-CM

## 2020-12-23 DIAGNOSIS — E782 Mixed hyperlipidemia: Secondary | ICD-10-CM

## 2020-12-23 DIAGNOSIS — E119 Type 2 diabetes mellitus without complications: Secondary | ICD-10-CM

## 2020-12-23 LAB — POCT URINALYSIS DIP (PROADVANTAGE DEVICE)
Bilirubin, UA: NEGATIVE
Glucose, UA: NEGATIVE mg/dL
Ketones, POC UA: NEGATIVE mg/dL
Nitrite, UA: NEGATIVE
Protein Ur, POC: NEGATIVE mg/dL
Specific Gravity, Urine: 1.02
Urobilinogen, Ur: NEGATIVE
pH, UA: 6 (ref 5.0–8.0)

## 2020-12-23 MED ORDER — SULFAMETHOXAZOLE-TRIMETHOPRIM 800-160 MG PO TABS
1.0000 | ORAL_TABLET | Freq: Two times a day (BID) | ORAL | 0 refills | Status: DC
Start: 2020-12-23 — End: 2021-01-06

## 2020-12-23 NOTE — Progress Notes (Signed)
Chief Complaint  Patient presents with   Dysuria    Pain with urination. Had intercourse Monday and then went home and masturbated and then urinated and it burned. Now is burning everytime she urinates.    2 days ago started with pain with urination. (As reported in [place], this started after intercourse and masturbation). Denies urinary urgency or frequency.  Discomfort is external, denies abdominal pain, flank pain (just her usual back pain). Possible fever the other night--had some clamminess/sweats at night, had some headache (Sunday night and Monday).  No headache since Monday night (HA was r/b aleve on Monday)  H/o UTI, last culture was 01/2020, enterobacter aerogenes, resistant to cefazolin, intermediate to nitrofurantoin. Prior UTI was 09/2019, E.coli, pansensitive.  Monogamous relationship, on her end, but would like STD testing.  "I'm sometimes paranoid about my partner", recently. Denies any vaginal discharge, odor, or itch.  Hyperlipidemia:  She saw Audelia Acton last week for med check. She stopped her statin 2 days ago, under the direction of Shane, related to body aches (which seem more acute, related to headaches and sweats, but thinks that she has had mild side effects for a while, just didn't recognize them until recently).  She has been on statin x 2 years.  DM--planning to restart the ozempic.  Just got a new prescription sent and plans to restart, awaiting prior auth, per pt.  Prev did well on ozempic until she got to the higher dose, then had some nausea. Not currently on any DM meds. Lab Results  Component Value Date   HGBA1C 6.5 (A) 12/17/2020   Hypertension:  reports compliance with her BP meds.  She reports being a little bit upset about something that occurred at work just before her appointment today, and that BP's are usually fine. BP Readings from Last 3 Encounters:  12/23/20 (!) 160/88  12/17/20 130/88  11/11/20 124/78    PMH, PSH, SH reviewed:  Outpatient Encounter  Medications as of 12/23/2020  Medication Sig Note   Blood Glucose Monitoring Suppl (CONTOUR MONITOR) w/Device KIT 1 kit by Does not apply route 2 (two) times a day.    CONTOUR NEXT TEST test strip USE 1 STRIP TO CHECK GLUCOSE TWICE DAILY    lisinopril-hydrochlorothiazide (ZESTORETIC) 10-12.5 MG tablet Take 1 tablet by mouth daily.    Microlet Lancets MISC Test blood sugar twice a day    neomycin-colistin-hydrocortisone-thonzonium (COLY-MYCIN S) 3.05-07-08-0.5 MG/ML OTIC suspension Place 2 drops into the left ear 3 (three) times daily.    Probiotic Product (PROBIOTIC-10 PO) Take 1 mg by mouth 1 day or 1 dose. Pt. Takes every other day    sulfamethoxazole-trimethoprim (BACTRIM DS) 800-160 MG tablet Take 1 tablet by mouth 2 (two) times daily.    atorvastatin (LIPITOR) 10 MG tablet Take 1 tablet by mouth once daily (Patient not taking: Reported on 12/23/2020) 12/23/2020: Stopped 2 d ago, taking 2 week break due to body aches   Semaglutide,0.25 or 0.5MG/DOS, (OZEMPIC, 0.25 OR 0.5 MG/DOSE,) 2 MG/1.5ML SOPN Inject 0.5 mg into the skin once a week. (Patient not taking: Reported on 12/23/2020)    valACYclovir (VALTREX) 500 MG tablet Take 1 tablet (500 mg total) by mouth 2 (two) times daily. X 3 days for outbreak. (Patient not taking: Reported on 12/23/2020)    No facility-administered encounter medications on file as of 12/23/2020.   NOT taking bactrim prior to today's visit.  No Known Allergies  ROS: no fevers (poss subjective, vs hot flashes).  Has some headaches, myalgias and dysuria,  per HPI.  No URI symptoms, cough, shortness of breath.  No chest pain, palpitations. No edema.  No n/v/d, vaginal bleeding or discharge. Moods are good.   PHYSICAL EXAM:  BP (!) 160/88   Pulse 80   Temp 98.2 F (36.8 C) (Tympanic)   Ht '5\' 6"'  (1.676 m)   Wt 200 lb 9.6 oz (91 kg)   LMP 04/25/2011   BMI 32.38 kg/m   142/90 on repeat by MD  Wt Readings from Last 3 Encounters:  12/23/20 200 lb 9.6 oz (91 kg)   12/17/20 201 lb 6.4 oz (91.4 kg)  11/11/20 195 lb 3.2 oz (88.5 kg)   Well-appearing, pleasant female, in good spirits, in no distress HEENT: conjunctiva and sclera are clear, EOMI, wearing mask Neck: No lymphadenopathy or mass Heart: regular rate and rhythm Lungs: clear bilaterally Back: no CVA tenderness Abdomen: soft, nontender, no suprapubic tenderness Extremities: no edema GU: Inflamed hair follicles x 2 in suprapubic area possibly recently drained (no active drainage).  Otherwise external genitalia is normal--no erythema, lesions, skin is intact. Psych: normal mood, affect, hygiene and grooming Neuro: alert and oriented, normal strength, gait.  Urine: SG 1.020, 2+ leuks, trace blood   ASSESSMENT/PLAN:  Acute cystitis without hematuria - encouraged voiding after intercourse. Treat UTI with Septra, contact us if sx persist/worsen - Plan: Urine Culture, sulfamethoxazole-trimethoprim (BACTRIM DS) 800-160 MG tablet  Burning with urination - suspect UTI.  Patient concerned about poss of STD, check for GC/chlamydia, declines HIV/RPR. - Plan: POCT Urinalysis DIP (Proadvantage Device), Urine Culture, GC/Chlamydia Probe Amp  Essential hypertension - BP elevated; encouraged low Na diet, daily exercise, wt loss. Cont current meds and monitor BP.  f/u if BP persistently above goal  Mixed dyslipidemia - poss statin intolerance, taking 2 week break. Encouraged re-try along with coQ10, and to contact us if can't tolerate to change statin  Controlled type 2 diabetes mellitus without complication, without long-term current use of insulin ([place]) - encouraged to check online (or with pharm) for coupon to help with coverage so she can resume med  Menopausal symptoms - pt is s/p hysterectomy, having some hot flashes.  Reviewed supplements available OTC (recognizing limited EBM) she can try if worsening   I spent 57 minutes dedicated to the care of this patient, including pre-visit review of records,  face to face time, post-visit ordering of testing and documentation.  Take the antibiotic twice daily for 5 days.  Let us know if your symptoms don't resolve or get worse (fever, flank pain, vomiting, or any allergic reaction to the mediation). We will contact you with the results.  Feel free to try over-the-counter products for menopausal symptoms (ie Estroven with soy and black cohosh, vs Amberen, vs others).  If worsening symptoms, we can discuss potential prescription medications.  After 2 weeks of being off the atorvastatin, when you go to restart it, you should also be taking coenzyme Q10. This helps prevent the muscle aches related to statins.  You may want to start this now. If you have recurrent muscle aches upon restarting, please let us know so that we can change the statin and try a different one.  If you're having trouble getting the Ozempic filled/authorized,  please check [email or web address] for any coupons (or ask the pharmacy if they have them to run).  Your blood pressure was high today.  Please continue to monitor at home.  Goal is <130/80.  If it is consistently over 135-140/85-90, please let us know. Try and limit  sodium intake and exercise regularly

## 2020-12-23 NOTE — Patient Instructions (Addendum)
Take the antibiotic twice daily for 5 days.  Let us know if your symptoms don't resolve or get worse (fever, flank pain, vomiting, or any allergic reaction to the mediation). We will contact you with the results.  Feel free to try over-the-counter products for menopausal symptoms (ie Estroven with soy and black cohosh, vs Amberen, vs others).  If worsening symptoms, we can discuss potential prescription medications.  After 2 weeks of being off the atorvastatin, when you go to restart it, you should also be taking coenzyme Q10. This helps prevent the muscle aches related to statins.  You may want to start this now. If you have recurrent muscle aches upon restarting, please let us know so that we can change the statin and try a different one.  If you're having trouble getting the Ozempic filled/authorized,  please check [email or web address] for any coupons (or ask the pharmacy if they have them to run).  Your blood pressure was high today.  Please continue to monitor at home.  Goal is <130/80.  If it is consistently over 135-140/85-90, please let us know. Try and limit sodium intake and exercise regularly  Use warm compresses at least twice daily to the suprapubic area (where the lumps are), and continue the mupirocin (bactroban) twice daily until better.

## 2020-12-23 NOTE — Telephone Encounter (Signed)
Pt left message that a medication needed prior auth.  I called pt and left her a message.  Called pharmacy and Ozempic needs P.A. they will fax request

## 2020-12-24 NOTE — Telephone Encounter (Signed)
P.A. OZEMPIC 

## 2020-12-25 LAB — URINE CULTURE

## 2020-12-25 LAB — GC/CHLAMYDIA PROBE AMP
Chlamydia trachomatis, NAA: NEGATIVE
Neisseria Gonorrhoeae by PCR: NEGATIVE

## 2020-12-25 NOTE — Telephone Encounter (Signed)
P.A. approved, pt informed, she has discount card

## 2021-01-06 ENCOUNTER — Other Ambulatory Visit (INDEPENDENT_AMBULATORY_CARE_PROVIDER_SITE_OTHER): Payer: BC Managed Care – PPO

## 2021-01-06 ENCOUNTER — Other Ambulatory Visit: Payer: Self-pay

## 2021-01-06 ENCOUNTER — Encounter: Payer: Self-pay | Admitting: Family Medicine

## 2021-01-06 ENCOUNTER — Telehealth: Payer: BC Managed Care – PPO | Admitting: Family Medicine

## 2021-01-06 VITALS — BP 164/88 | Ht 64.0 in | Wt 200.0 lb

## 2021-01-06 DIAGNOSIS — B349 Viral infection, unspecified: Secondary | ICD-10-CM

## 2021-01-06 DIAGNOSIS — R0989 Other specified symptoms and signs involving the circulatory and respiratory systems: Secondary | ICD-10-CM

## 2021-01-06 DIAGNOSIS — I10 Essential (primary) hypertension: Secondary | ICD-10-CM

## 2021-01-06 DIAGNOSIS — Z20822 Contact with and (suspected) exposure to covid-19: Secondary | ICD-10-CM

## 2021-01-06 DIAGNOSIS — E782 Mixed hyperlipidemia: Secondary | ICD-10-CM

## 2021-01-06 LAB — POC COVID19 BINAXNOW: SARS Coronavirus 2 Ag: NEGATIVE

## 2021-01-06 NOTE — Patient Instructions (Addendum)
Your COVID rapid test was negative today.  Your PCR should be back tomorrow.  Regardless of this result, since you are feeling much better, you can return to work tomorrow.  You should remain masked 100% of the time until you get the PCR results. If negative, then you do not need to continue mask wearing.  If positive, you need to continue to wear your mask when around others through 11/7.  Tuesday, 11/8 would be day 11, and masks no longer needed.  If PCR is also negative, then likely is a different virus.  Your blood pressure was elevated, likely due to not taking your medication for the last 2 days.  Please take your blood pressure medication and monitor it to make sure it comes back down to normal (goal is <130/80).  Resume the atorvastatin (cholesterol med) as planned, and let us know if your body aches/myalgias recur after resuming the medication.

## 2021-01-06 NOTE — Progress Notes (Signed)
Start time: 9:41 End time: 9:56  Virtual Visit via Video Note  I connected with [employer] on 01/06/21 by a video enabled telemedicine application and verified that I am speaking with the correct person using two identifiers.  Location: Patient: home Provider: office   I discussed the limitations of evaluation and management by telemedicine and the availability of in person appointments. The patient expressed understanding and agreed to proceed.  History of Present Illness:  Chief Complaint  Patient presents with   Nasal Congestion    VIRTUAL runny nose, loss of appetite and fatigue-was exposed to covid. Symptoms started Fri and was exposed to Wed. Negative home test Sunday. No fever.    Coworkers are sick with COVID.  Friday 10/28 she started with headache, chills, fatigue, decreased appetite. She also had slight shortness of breath (noted with going upstairs). She had a negative home COVID test on 10/30.  She is feeling better--all symptoms resolved. Feels 75% better, still a little tired. Not needing any OTC meds today or yesterday. She used herbal tea earlier in illness.    She had COVID vaccines x 3  Outpatient Encounter Medications as of 01/06/2021  Medication Sig Note   Blood Glucose Monitoring Suppl (CONTOUR MONITOR) w/Device KIT 1 kit by Does not apply route 2 (two) times a day.    CONTOUR NEXT TEST test strip USE 1 STRIP TO CHECK GLUCOSE TWICE DAILY    Microlet Lancets MISC Test blood sugar twice a day    Probiotic Product (PROBIOTIC-10 PO) Take 1 mg by mouth 1 day or 1 dose. Pt. Takes every other day    Semaglutide,0.25 or 0.5MG/DOS, (OZEMPIC, 0.25 OR 0.5 MG/DOSE,) 2 MG/1.5ML SOPN Inject 0.5 mg into the skin once a week.    Zinc 100 MG TABS Take by mouth. 01/06/2021: gummy   atorvastatin (LIPITOR) 10 MG tablet Take 1 tablet by mouth once daily (Patient not taking: Reported on 01/06/2021) 01/06/2021: On last week of 2 week break   lisinopril-hydrochlorothiazide  (ZESTORETIC) 10-12.5 MG tablet Take 1 tablet by mouth daily. (Patient not taking: Reported on 01/06/2021) 01/06/2021: Hasn't taken it today or yesterday   neomycin-colistin-hydrocortisone-thonzonium (COLY-MYCIN S) 3.05-07-08-0.5 MG/ML OTIC suspension Place 2 drops into the left ear 3 (three) times daily. (Patient not taking: Reported on 01/06/2021)    valACYclovir (VALTREX) 500 MG tablet Take 1 tablet (500 mg total) by mouth 2 (two) times daily. X 3 days for outbreak. (Patient not taking: No sig reported)    [DISCONTINUED] sulfamethoxazole-trimethoprim (BACTRIM DS) 800-160 MG tablet Take 1 tablet by mouth 2 (two) times daily.    No facility-administered encounter medications on file as of 01/06/2021.   No Known Allergies  ROS:  HA, fatigue, chills, some SOB per HPI, resolved except for some mild residual fatigue. She never had cough, sore throat, n/v/d. Urinary symptoms resolved (recent UTI) Denies chest pain. No bleeding, bruising, rash or other concerns.   Observations/Objective:  BP (!) 164/88   Ht '5\' 4"'  (1.626 m)   Wt 200 lb (90.7 kg)   LMP 04/25/2011   BMI 34.33 kg/m   Well-appearing, pleasant female, in no distress. She is alert, oriented.  Cranial nerves grossly intact. Speaking easily and comfortably. No throat-clearing or cough. Exam is limited due to virtual nature of the visit.  Rapid COVID test negative   Assessment and Plan:  Contact with and (suspected) exposure to covid-19  Viral syndrome - suspect COVID given +exposures at work.  Negative test on 10/30 and today--PCR pending.  Regardless, today is day 5, can RTW tomorrow with mask.  Essential hypertension - BP elevated, missed 2 days of meds. To take meds, monitor BP and f/u if remains elevated  Mixed dyslipidemia - currently on 2 week break from statin, myalgias resolved.  To contact us if SE recur once she restarts, so that we can try a different statin.   Rapid COVID and PCR if negative. Discussed paxlovid if +  COVID test--declined since feeling so much better.  Friday was day 0. Isolate day 1-5 isolate (through the end of Wednesday). May return to work tomorrow, but if [place] is + (or until PCR results back), you should continue masking 100% of the time through day 10 (next Monday).  May stop the mask use on Tuesday 11/8 (day 11).    Follow Up Instructions:    I discussed the assessment and treatment plan with the patient. The patient was provided an opportunity to ask questions and all were answered. The patient agreed with the plan and demonstrated an understanding of the instructions.   The patient was advised to call back or seek an in-person evaluation if the symptoms worsen or if the condition fails to improve as anticipated.  I spent 23 minutes dedicated to the care of this patient, including pre-visit review of records, face to face time, post-visit ordering of testing and documentation.    Vikki Ports, MD

## 2021-01-07 ENCOUNTER — Ambulatory Visit
Admission: RE | Admit: 2021-01-07 | Discharge: 2021-01-07 | Disposition: A | Discharge status: Home / Self Care | Payer: No Typology Code available for payment source | Source: Ambulatory Visit | Attending: Medical | Admitting: Medical

## 2021-01-07 DIAGNOSIS — Z87891 Personal history of nicotine dependence: Secondary | ICD-10-CM | POA: Diagnosis not present

## 2021-01-07 DIAGNOSIS — Z8249 Family history of ischemic heart disease and other diseases of the circulatory system: Secondary | ICD-10-CM | POA: Diagnosis not present

## 2021-01-07 DIAGNOSIS — I10 Essential (primary) hypertension: Secondary | ICD-10-CM

## 2021-01-07 DIAGNOSIS — Z136 Encounter for screening for cardiovascular disorders: Secondary | ICD-10-CM

## 2021-01-07 DIAGNOSIS — E785 Hyperlipidemia, unspecified: Secondary | ICD-10-CM | POA: Diagnosis not present

## 2021-01-07 DIAGNOSIS — R739 Hyperglycemia, unspecified: Secondary | ICD-10-CM

## 2021-01-07 LAB — NOVEL CORONAVIRUS, NAA: SARS-CoV-2, NAA: NOT DETECTED

## 2021-01-07 LAB — SARS-COV-2, NAA 2 DAY TAT

## 2021-01-13 DIAGNOSIS — F319 Bipolar disorder, unspecified: Secondary | ICD-10-CM | POA: Diagnosis not present

## 2021-01-14 ENCOUNTER — Other Ambulatory Visit: Payer: Self-pay | Admitting: *Deleted

## 2021-01-14 ENCOUNTER — Encounter: Payer: Self-pay | Admitting: *Deleted

## 2021-01-18 DIAGNOSIS — F319 Bipolar disorder, unspecified: Secondary | ICD-10-CM | POA: Diagnosis not present

## 2021-01-20 DIAGNOSIS — F319 Bipolar disorder, unspecified: Secondary | ICD-10-CM | POA: Diagnosis not present

## 2021-01-26 DIAGNOSIS — F319 Bipolar disorder, unspecified: Secondary | ICD-10-CM | POA: Diagnosis not present

## 2021-02-03 DIAGNOSIS — F319 Bipolar disorder, unspecified: Secondary | ICD-10-CM | POA: Diagnosis not present

## 2021-02-16 ENCOUNTER — Telehealth: Payer: BC Managed Care – PPO | Admitting: Family Medicine

## 2021-02-16 ENCOUNTER — Encounter: Payer: Self-pay | Admitting: Family Medicine

## 2021-02-16 ENCOUNTER — Other Ambulatory Visit: Payer: Self-pay

## 2021-02-16 VITALS — BP 138/96 | HR 78 | Temp 99.1°F | Wt 198.0 lb

## 2021-02-16 DIAGNOSIS — J111 Influenza due to unidentified influenza virus with other respiratory manifestations: Secondary | ICD-10-CM | POA: Diagnosis not present

## 2021-02-16 NOTE — Progress Notes (Signed)
° °  Subjective:    Patient ID: Deborah Haney, female    DOB: 01/08/1966, 55 y.o.   MRN: [id number]  HPI Documentation for virtual audio and video telecommunications through [facility] encounter: The patient was located at home. 2 patient identifiers used.  The provider was located in the office. The patient did consent to this visit and is aware of possible charges through their insurance for this visit. The other persons participating in this telemedicine service were none. Time spent on call was 4 minutes and in review of previous records >16 minutes total for counseling and coordination of care. This virtual service is not related to other E/M service within previous 7 days.  She states that on Sunday she developed malaise, chills, fatigue.  She tested herself that day for COVID and the test was negative.  Yesterday she noted worsening of headache, sore throat, nasal congestion, myalgias and rhinorrhea.  She states her temperature was 100.1.  Today she has had more of the same.  She did leave work early the last day or so.  Review of Systems     Objective:   Physical Exam Alert and in no distress otherwise not examined       Assessment & Plan:  Influenza I think her symptoms are consistent with influenza.  Recommend 2 Tylenol 4 times per day for symptomatic relief as well as gargling.  I will give her a note to return to work on Thursday.  Explained that once she is fever free for 24 hours without an antipyretic she can return to work.  I anticipate this will occur as mentioned above.

## 2021-02-17 DIAGNOSIS — F319 Bipolar disorder, unspecified: Secondary | ICD-10-CM | POA: Diagnosis not present

## 2021-02-18 ENCOUNTER — Telehealth: Payer: Self-pay | Admitting: Family Medicine

## 2021-02-18 NOTE — Telephone Encounter (Signed)
Pt was seen virtually on Tuesday and called and said she is still having a bad cough and chest congestion and was not able to go to work today. She said her other symptoms are better but her chest feels heavy with the congestion. She also wants to see if she can get her work note extended and return to work Monday. 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

## 2021-02-22 NOTE — Telephone Encounter (Signed)
Pt advised she did get her work note. [place]

## 2021-02-24 DIAGNOSIS — F319 Bipolar disorder, unspecified: Secondary | ICD-10-CM | POA: Diagnosis not present

## 2021-02-25 ENCOUNTER — Encounter: Payer: Self-pay | Admitting: Family Medicine

## 2021-02-25 MED ORDER — FLUCONAZOLE 150 MG PO TABS: 150.0000 mg | ORAL_TABLET | Freq: Once | ORAL | 0 refills | Status: AC

## 2021-03-04 ENCOUNTER — Encounter: Payer: Self-pay | Admitting: Family Medicine

## 2021-03-04 ENCOUNTER — Encounter: Payer: Self-pay | Admitting: Dietician

## 2021-03-04 ENCOUNTER — Other Ambulatory Visit: Payer: Self-pay

## 2021-03-04 ENCOUNTER — Encounter: Payer: BC Managed Care – PPO | Attending: Medical | Admitting: Dietician

## 2021-03-04 DIAGNOSIS — E1165 Type 2 diabetes mellitus with hyperglycemia: Secondary | ICD-10-CM | POA: Insufficient documentation

## 2021-03-04 NOTE — Progress Notes (Signed)
Medical Nutrition Therapy  Appointment Start time:  [phone]  Appointment End time:  52  Primary concerns today: Patient states that she would like to control her blood sugar and cholesterol without medication.  She would also like to lose weight. She is also questioning how her body processed food post gallbladder removal about 2 years ago. Referral diagnosis: Type 2 Diabetes Preferred learning style: no preference indicated Learning readiness: ready   NUTRITION ASSESSMENT   Anthropometrics  53" 194 lbs 216 lbs highest adult weight 1 year ago  53 lbs lowest adult weight at age 55 (but while on drugs) 157 lbs patient's goal weight  Clinical Medical Hx: Type 2 diabetes (2020), HTN, HLD, GERD Medications: Ozempic (off since pharmacy was out of and now unsure how to restart) Labs: A1C 6.5% 12/17/2020 decreased from 6.9% 53/18/2022, cholesterol 119, triglycerides 67, HDL 42, LDL 63 04/2020 Notable Signs/Symptoms: none  Lifestyle & Dietary Hx Patient lives alone.  She just finished college 07/2020 in social work and is now planning to focus more on her health. 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  Estimated daily fluid intake: 30 oz water plus 20 oz of coffee plus 20 oz tea plus 16 oz probiotic Supplements: probiotic (gets diarrhea when she does not take it) Sleep: 7-8 Stress / self-care: high stress job, fair self care Current average weekly physical activity: She is doing a stretch clinic once per week and is aiming to do these stretches consistently at home.  She is not exercising yet as she is preparing her body for more since an Ankle fracture 03/2020.  24-Hr Dietary Recall First Meal: skips 4-5 days per week OR bacon, eggs, and cheese on bun Snack: occasional nuts, banana Second Meal: skips frequently OR fried fish, bread, slaw OR shrimp and cheese burger, slaw OR leftovers Snack:  snack size candy bar or skittles or peppermint Third Meal: Shrimp and grits OR air fried chicken, green peas, cabbage Snack: potato chips, graham crackers, apples Beverages: water, coffee with regular hazelnut creamer, Karma probiotic drink, [place] punch (regular), cranberry juice, other juice  NUTRITION DIAGNOSIS  NB-1.1 Food and nutrition-related knowledge deficit As related to balance of carbohydrates, protein, and fat.  As evidenced by diet hx and patient report.   NUTRITION INTERVENTION  Nutrition education (E-1) on the following topics:  Discussed Carb Counting by food group as method of portion control Reading food labels Benefits of increased activity Basic physiology of Diabetes Target BG ranges pre and post meals, and A1c.  Encouraged regular meal schedule Discussed mindfullness  Handouts Provided Include  How to Thrive:  A Guide for Your Journey with Diabetes by the ADA Meal Plan Card Snack list Label reading Diabetes Resources  Learning Style & Readiness for Change Teaching method utilized: Visual & Auditory  Demonstrated degree of understanding via: Teach Back  Barriers to learning/adherence to lifestyle change: stress  Goals Exercise most days for at least 30 minutes (video, walking, etc.)  Rethink your drinks.  Aim for beverages without carbohydrates.  Choose whole grains Eat more vegetables Lean meat or have beans instead Low saturated fat (no or low bacon, butter, etc) Mindfulness  Choices  Regularly scheduled meals  Snacks, "Am I eating because I am hungry or for another reason?"  counseling and diabetes education initiated.   MONITORING & EVALUATION Dietary intake, weekly physical activity, and label reading in 6 weeks.  Next  Steps  Patient is to call for questions.

## 2021-03-04 NOTE — Patient Instructions (Addendum)
Exercise most days for at least 30 minutes (video, walking, etc.)  Rethink your drinks.  Aim for beverages without carbohydrates.  Choose whole grains Eat more vegetables Lean meat or have beans instead Low saturated fat (no or low bacon, butter, etc) Mindfulness  Choices  Regularly scheduled meals  Snacks, "Am I eating because I am hungry or for another reason?"  counseling and diabetes education initiated.

## 2021-03-05 DIAGNOSIS — H40033 Anatomical narrow angle, bilateral: Secondary | ICD-10-CM | POA: Diagnosis not present

## 2021-03-05 DIAGNOSIS — E119 Type 2 diabetes mellitus without complications: Secondary | ICD-10-CM | POA: Diagnosis not present

## 2021-03-10 DIAGNOSIS — F319 Bipolar disorder, unspecified: Secondary | ICD-10-CM | POA: Diagnosis not present

## 2021-03-18 DIAGNOSIS — F319 Bipolar disorder, unspecified: Secondary | ICD-10-CM | POA: Diagnosis not present

## 2021-03-24 DIAGNOSIS — F319 Bipolar disorder, unspecified: Secondary | ICD-10-CM | POA: Diagnosis not present

## 2021-03-31 DIAGNOSIS — F319 Bipolar disorder, unspecified: Secondary | ICD-10-CM | POA: Diagnosis not present

## 2021-04-07 DIAGNOSIS — F319 Bipolar disorder, unspecified: Secondary | ICD-10-CM | POA: Diagnosis not present

## 2021-04-11 ENCOUNTER — Emergency Department (HOSPITAL_BASED_OUTPATIENT_CLINIC_OR_DEPARTMENT_OTHER): Payer: BC Managed Care – PPO | Admitting: Radiology

## 2021-04-11 ENCOUNTER — Encounter (HOSPITAL_BASED_OUTPATIENT_CLINIC_OR_DEPARTMENT_OTHER): Payer: Self-pay

## 2021-04-11 ENCOUNTER — Emergency Department (HOSPITAL_BASED_OUTPATIENT_CLINIC_OR_DEPARTMENT_OTHER)
Admission: EM | Admit: 2021-04-11 | Discharge: 2021-04-11 | Disposition: A | Discharge status: Home / Self Care | Payer: BC Managed Care – PPO | Attending: Emergency Medicine | Admitting: Emergency Medicine

## 2021-04-11 ENCOUNTER — Other Ambulatory Visit: Payer: Self-pay

## 2021-04-11 DIAGNOSIS — J189 Pneumonia, unspecified organism: Secondary | ICD-10-CM | POA: Diagnosis not present

## 2021-04-11 DIAGNOSIS — Z20822 Contact with and (suspected) exposure to covid-19: Secondary | ICD-10-CM | POA: Insufficient documentation

## 2021-04-11 DIAGNOSIS — R0602 Shortness of breath: Secondary | ICD-10-CM | POA: Diagnosis not present

## 2021-04-11 DIAGNOSIS — R062 Wheezing: Secondary | ICD-10-CM | POA: Diagnosis not present

## 2021-04-11 LAB — RESP PANEL BY RT-PCR (FLU A&B, COVID) ARPGX2
Influenza A by PCR: NEGATIVE
Influenza B by PCR: NEGATIVE
SARS Coronavirus 2 by RT PCR: NEGATIVE

## 2021-04-11 MED ORDER — DEXAMETHASONE SODIUM PHOSPHATE 10 MG/ML IJ SOLN
10.0000 mg | Freq: Once | INTRAMUSCULAR | Status: AC
  Administered 2021-04-11: 10 mg via INTRAMUSCULAR
  Filled 2021-04-11: qty 1

## 2021-04-11 MED ORDER — IPRATROPIUM-ALBUTEROL 0.5-2.5 (3) MG/3ML IN SOLN: 3.0000 mL | Freq: Once | RESPIRATORY_TRACT | Status: AC

## 2021-04-11 MED ORDER — DOXYCYCLINE HYCLATE 100 MG PO TABS
100.0000 mg | ORAL_TABLET | Freq: Once | ORAL | Status: AC
  Administered 2021-04-11: 100 mg via ORAL
  Filled 2021-04-11: qty 1

## 2021-04-11 MED ORDER — PROCHLORPERAZINE EDISYLATE 10 MG/2ML IJ SOLN
10.0000 mg | Freq: Once | INTRAMUSCULAR | Status: AC
  Administered 2021-04-11: 10 mg via INTRAMUSCULAR
  Filled 2021-04-11: qty 2

## 2021-04-11 MED ORDER — IBUPROFEN 800 MG PO TABS
800.0000 mg | ORAL_TABLET | Freq: Once | ORAL | Status: AC
  Administered 2021-04-11: 800 mg via ORAL
  Filled 2021-04-11: qty 1

## 2021-04-11 MED ORDER — IPRATROPIUM-ALBUTEROL 0.5-2.5 (3) MG/3ML IN SOLN
RESPIRATORY_TRACT | Status: AC
  Administered 2021-04-11: 3 mL via RESPIRATORY_TRACT
  Filled 2021-04-11: qty 3

## 2021-04-11 MED ORDER — BENZONATATE 100 MG PO CAPS: 100.0000 mg | ORAL_CAPSULE | Freq: Three times a day (TID) | ORAL | 0 refills | Status: DC

## 2021-04-11 MED ORDER — DOXYCYCLINE HYCLATE 100 MG PO CAPS: 100.0000 mg | ORAL_CAPSULE | Freq: Two times a day (BID) | ORAL | 0 refills | Status: DC

## 2021-04-11 MED ORDER — DIPHENHYDRAMINE HCL 25 MG PO CAPS
25.0000 mg | ORAL_CAPSULE | Freq: Once | ORAL | Status: AC
Start: 2021-04-11 — End: 2021-04-11
  Administered 2021-04-11: 25 mg via ORAL
  Filled 2021-04-11: qty 1

## 2021-04-11 MED ORDER — DEXAMETHASONE SODIUM PHOSPHATE 10 MG/ML IJ SOLN: 10.0000 mg | Freq: Once | INTRAMUSCULAR | Status: DC

## 2021-04-11 NOTE — Discharge Instructions (Signed)
Your x-ray today showed that you have some pneumonia in the right lower part of your lung.  I have sent you home with a prescription for antibiotics that you can start tomorrow as we gave you your first dose here in the emergency department today.  Have also given you as steroid shot which should help with both your current headache and with inflammation in your lungs and should last you for a few days.  Have also sent you home with a cough medication that will suppress your cough, however I recommend only using this as night if you need to sleep. During the day, I would prefer you to use a decongestant such as Mucinex to help cough up the infection in your lungs.  Please follow-up with your primary care doctor at the end of your course of antibiotics to recheck an x-ray and ensure that pneumonia has resolved.  Return to the emergency department if you have worsening shortness of breath or fevers that cannot be controlled with Motrin.

## 2021-04-11 NOTE — ED Provider Notes (Signed)
[facility] EMERGENCY DEPT Provider Note   CSN: [id number] Arrival date & time: 04/11/21  1922     History  Chief Complaint  Patient presents with   Shortness of Deborah Haney is a 56 y.o. female who presents the ED for evaluation of shortness of breath.  Patient has been sick for approximately 5 or 6 days now and endorses worsening cough and difficulty breathing.  She was around someone who had COVID, however she is taken to home COVID test that were negative.  She has been taking TheraFlu, NyQuil and ibuprofen and Tylenol as needed for her body aches and fever.  She also has a leftover inhaler from her previous COVID infection which she has used without any relief.  Patient denies sore throat, abdominal pain, nausea, vomiting, diarrhea.     Shortness of Breath Associated symptoms: cough and fever   Associated symptoms: no abdominal pain, no headaches, no rash and no vomiting       Home Medications Prior to Admission medications   Medication Sig Start Date End Date Taking? Authorizing Provider  benzonatate (TESSALON) 100 MG capsule Take 1 capsule (100 mg total) by mouth every 8 (eight) hours. 04/11/21  Yes Kathe Becton R, PA-C  doxycycline (VIBRAMYCIN) 100 MG capsule Take 1 capsule (100 mg total) by mouth 2 (two) times daily for 5 days. First dose given in ED on 04/11/2021 04/12/21 04/17/21 Yes Kathe Becton R, PA-C  atorvastatin (LIPITOR) 10 MG tablet Take 1 tablet by mouth once daily Patient not taking: Reported on 01/06/2021 10/26/20   Harland Dingwall L, PA-C  Blood Glucose Monitoring Suppl (CONTOUR MONITOR) w/Device KIT 1 kit by Does not apply route 2 (two) times a day. 07/02/18   Henson, Vickie L, PA-C  CONTOUR NEXT TEST test strip USE 1 STRIP TO CHECK GLUCOSE TWICE DAILY 02/27/20   Henson, Vickie L, PA-C  lisinopril-hydrochlorothiazide (ZESTORETIC) 10-12.5 MG tablet Take 1 tablet by mouth daily. 12/17/20   Tysinger, Camelia Eng, PA-C  Microlet Lancets MISC Test  blood sugar twice a day 07/02/18   Henson, Vickie L, PA-C  neomycin-colistin-hydrocortisone-thonzonium (COLY-MYCIN S) 3.05-07-08-0.5 MG/ML OTIC suspension Place 2 drops into the left ear 3 (three) times daily. 09/28/20   Denita Lung, MD  Probiotic Product (PROBIOTIC-10 PO) Take 1 mg by mouth 1 day or 1 dose. Pt. Takes every other day    [provider]  Semaglutide,0.25 or 0.5MG/DOS, (OZEMPIC, 0.25 OR 0.5 MG/DOSE,) 2 MG/1.5ML SOPN Inject 0.5 mg into the skin once a week. Patient not taking: Reported on 03/04/2021 12/17/20   Tysinger, Camelia Eng, PA-C  valACYclovir (VALTREX) 500 MG tablet Take 1 tablet (500 mg total) by mouth 2 (two) times daily. X 3 days for outbreak. 11/11/20   Henson, Vickie L, PA-C  Zinc 100 MG TABS Take by mouth.    [provider]      Allergies    Patient has no known allergies.    Review of Systems   Review of Systems  Constitutional:  Positive for fever.  HENT: Negative.    Eyes: Negative.   Respiratory:  Positive for cough and shortness of breath.   Cardiovascular: Negative.   Gastrointestinal:  Negative for abdominal pain and vomiting.  Endocrine: Negative.   Genitourinary: Negative.   Musculoskeletal: Negative.   Skin:  Negative for rash.  Neurological:  Negative for headaches.  All other systems reviewed and are negative.  Physical Exam Updated Vital Signs BP 133/86    Pulse  100    Temp (!) 100.9 F (38.3 C) (Oral)    Resp 17    Ht '5\' 6"'  (1.676 m)    Wt 89.8 kg    LMP 04/25/2011    SpO2 98%    BMI 31.96 kg/m  Today's Vitals   04/11/21 1935 04/11/21 1948 04/11/21 2030 04/11/21 2100  BP: 133/86 133/86 138/78 130/81  Pulse: 91 100 92 95  Resp: '16 17 18 14  ' Temp:      TempSrc:      SpO2: 99% 98% 99% 99%  Weight:      Height:      PainSc:       Body mass index is 31.96 kg/m.  Physical Exam Vitals and nursing note reviewed.  Constitutional:      General: She is not in acute distress.    Appearance: She is ill-appearing. She is not  toxic-appearing.  HENT:     Head: Atraumatic.  Eyes:     Conjunctiva/sclera: Conjunctivae normal.  Cardiovascular:     Rate and Rhythm: Normal rate and regular rhythm.     Pulses: Normal pulses.     Heart sounds: No murmur heard. Pulmonary:     Effort: Pulmonary effort is normal. No respiratory distress.     Breath sounds: Examination of the right-lower field reveals rales. Rales present.     Comments: Patient with increased effort of breathing with accessory muscle use.  Coarse rales of the right lower lobe.  All other lung fields clear to auscultation. Abdominal:     General: Abdomen is flat. There is no distension.     Palpations: Abdomen is soft.     Tenderness: There is no abdominal tenderness.  Musculoskeletal:        General: Normal range of motion.     Cervical back: Normal range of motion.  Skin:    General: Skin is warm and dry.     Capillary Refill: Capillary refill takes less than 2 seconds.  Neurological:     General: No focal deficit present.     Mental Status: She is alert.  Psychiatric:        Mood and Affect: Mood normal.    ED Results / Procedures / Treatments   Labs (all labs ordered are listed, but only abnormal results are displayed) Labs Reviewed  RESP PANEL BY RT-PCR (FLU A&B, COVID) [id number]    EKG EKG Interpretation  Date/Time:  Sunday April 11 2021 19:32:03 EST Ventricular Rate:  93 PR Interval:  193 QRS Duration: 93 QT Interval:  352 QTC Calculation: 438 R Axis:   66 Text Interpretation: Sinus rhythm Consider left ventricular hypertrophy Confirmed by Nanda Quinton [phone]) on 04/11/2021 7:35:33 PM  Radiology DG Chest 2 View  Result Date: 04/11/2021 CLINICAL DATA:  Shortness of breath and wheezing for 3 days. EXAM: CHEST - 2 VIEW COMPARISON:  09/24/2018 FINDINGS: Heart size and pulmonary vascularity are normal. Vague patchy airspace disease suggested in the right lower lung suggesting early pneumonia. Left lung is clear. No pleural effusions.  No pneumothorax. Mediastinal contours appear intact. Degenerative changes in the spine. IMPRESSION: Patchy airspace disease suggested in the right lower lung. Electronically Signed   By: Lucienne Capers M.D.   On: 04/11/2021 21:24    Procedures Procedures    Medications Ordered in ED Medications  doxycycline (VIBRA-TABS) tablet 100 mg (has no administration in time range)  prochlorperazine (COMPAZINE) injection 10 mg (has no administration in time range)  diphenhydrAMINE (BENADRYL) capsule 25 mg (has  no administration in time range)  dexamethasone (DECADRON) injection 10 mg (has no administration in time range)  ipratropium-albuterol (DUONEB) 0.5-2.5 (3) MG/3ML nebulizer solution 3 mL (3 mLs Nebulization Given 04/11/21 1946)  ibuprofen (ADVIL) tablet 800 mg (800 mg Oral Given 04/11/21 2013)    ED Course/ Medical Decision Making/ A&P                           Medical Decision Making Amount and/or Complexity of Data Reviewed Radiology: ordered.  Risk Prescription drug management.   History:  Per HPI  Initial impression:  This patient presents to the ED for concern of shortness of breath, this involves an extensive number of treatment options, and is a complaint that carries with it a high risk of complications and morbidity.   The emergent differential diagnosis for shortness of breath includes, but is not limited to, Pulmonary edema, bronchoconstriction, Pneumonia, Pulmonary embolism, Pneumotherax/ Hemothorax, Dysrythmia, ACS.  Patient is ill-appearing, with increased work of breathing.  Oxygen was at 98% on room air.  She was febrile, and was being administered a breathing treatment by RT at my time of evaluation.  Pulmonary exam significant for rales of the right lower lobe.  Will give Motrin for fever and obtain labs and chest x-ray.  ED Course: Patient had slight improvement with breathing following her breathing treatments by RT.  Oxygen remains at 99% on room air.  Respiratory  panel was negative for COVID and flu.  X-ray confirms early pneumonia of the right lower lobe.  Patient also endorses worsening headache rated 9 out of 10 that was not improved with ibuprofen.  She was then given migraine cocktail of Decadron 10 mg, Benadryl 25, and Compazine 10 in addition to her first dose of doxycycline with improvement.   Lab Tests and EKG:  I Ordered, reviewed, and interpreted labs and EKG.  The pertinent results include:  Negative respiratory panel   Imaging Studies ordered:  I ordered imaging studies including chest x-ray with this right lower lobe pneumonia I independently visualized and interpreted imaging and I agree with the radiologist interpretation.    Cardiac Monitoring:  The patient was maintained on a cardiac monitor.  I personally viewed and interpreted the cardiac monitored which showed an underlying rhythm of: NSR   Medicines ordered and prescription drug management:  I ordered medication including: Ibuprofen 800 mg for fever and headache Doxycycline 100 mg for pneumonia Migraine cocktail as above Reevaluation of the patient after these medicines showed that the patient improved I have reviewed the patients home medicines and have made adjustments as needed  Disposition:  After consideration of the diagnostic results, physical exam, history and the patients response to treatment feel that the patent would benefit from discharge with strict return precautions.   CAP of right lower lobe: Patient was given Decadron and first dose of doxycycline here in the emergency department tonight.  She is to follow-up with her primary care doctor at the end of her course of antibiotics to repeat x-ray and ensure resolution of pneumonia.  Strict return precautions were discussed.  We discussed at home care measures.  All questions asked and answered.  Discharged home in good condition.   Final Clinical Impression(s) / ED Diagnoses Final diagnoses:  Community  acquired pneumonia of right lower lobe of lung    Rx / DC Orders ED Discharge Orders          Ordered    doxycycline (VIBRAMYCIN)  100 MG capsule  2 times daily        04/11/21 2145    benzonatate (TESSALON) 100 MG capsule  Every 8 hours        04/11/21 2145              Rodena Piety 04/11/21 2153    Margette Fast, MD 04/20/21 [phone]

## 2021-04-11 NOTE — ED Triage Notes (Signed)
Pt arrives POV with c/o shortness of breath and wheezing for approximately 3 days.  Says she has done 2 Covid tests that were negative.

## 2021-04-13 DIAGNOSIS — F319 Bipolar disorder, unspecified: Secondary | ICD-10-CM | POA: Diagnosis not present

## 2021-04-15 ENCOUNTER — Ambulatory Visit: Payer: BC Managed Care – PPO | Admitting: Dietician

## 2021-04-16 ENCOUNTER — Encounter: Payer: Self-pay | Admitting: Physician Assistant

## 2021-04-16 ENCOUNTER — Other Ambulatory Visit: Payer: Self-pay

## 2021-04-16 ENCOUNTER — Ambulatory Visit (INDEPENDENT_AMBULATORY_CARE_PROVIDER_SITE_OTHER): Payer: BC Managed Care – PPO | Admitting: Physician Assistant

## 2021-04-16 VITALS — BP 128/78 | HR 78 | Ht 66.0 in | Wt 186.6 lb

## 2021-04-16 DIAGNOSIS — E1165 Type 2 diabetes mellitus with hyperglycemia: Secondary | ICD-10-CM

## 2021-04-16 DIAGNOSIS — I10 Essential (primary) hypertension: Secondary | ICD-10-CM

## 2021-04-16 DIAGNOSIS — Z8719 Personal history of other diseases of the digestive system: Secondary | ICD-10-CM | POA: Diagnosis not present

## 2021-04-16 DIAGNOSIS — Z8701 Personal history of pneumonia (recurrent): Secondary | ICD-10-CM | POA: Diagnosis not present

## 2021-04-16 DIAGNOSIS — E119 Type 2 diabetes mellitus without complications: Secondary | ICD-10-CM

## 2021-04-16 DIAGNOSIS — R062 Wheezing: Secondary | ICD-10-CM

## 2021-04-16 MED ORDER — ACCU-CHEK SOFTCLIX LANCETS MISC: 12 refills | Status: AC

## 2021-04-16 MED ORDER — DOXYCYCLINE HYCLATE 100 MG PO CAPS: 100.0000 mg | ORAL_CAPSULE | Freq: Two times a day (BID) | ORAL | 0 refills | Status: AC

## 2021-04-16 MED ORDER — ALBUTEROL SULFATE HFA 108 (90 BASE) MCG/ACT IN AERS: 2.0000 | INHALATION_SPRAY | Freq: Four times a day (QID) | RESPIRATORY_TRACT | 0 refills | Status: DC | PRN

## 2021-04-16 NOTE — Patient Instructions (Signed)
You can take an over the counter antihistamine to help with sleep: Benadryl (Diphenhydramine) 25 mg as directed. You can go to a store with a pharmacy and ask them to help you find these medicines.

## 2021-04-16 NOTE — Progress Notes (Signed)
Acute Office Visit  Subjective:    Patient ID: Deborah Haney, female    DOB: 04/26/1965, 56 y.o.   MRN: [id number]  Chief Complaint  Patient presents with   Pneumonia    Hospital follow up- Feels some better but still having chest congestion.   Blood In Stools    States happens when she takes motrin and nyquil. Had bloody stool this morning first occurrence since December. Used to see [facility] for this but has not been able to get back in, was r/t hemorrhoids in the past    HPI Patient is in today for follow up of pneumonia diagnosed in the ED on 04/11/2021. States she feels the same and has finished her 5 day course of doxycycline. States she is wheezing while lying down at night; is taking tessalon perles at night, but is still coughing quite a bit; denies fever/chills/nausea/vomiting, no current tobacco use.  Also reports she continues to have rectal bleeding worse after taking ibuprofen or Nyquil; has not been evaluated by GI in a while.   Past Medical History:  Diagnosis Date   Acute cholecystitis 07/14/2013   Acute on chronic respiratory failure with hypoxia ([place]) 09/05/2018   Anemia    has history, takes iron   Anxiety    Bipolar affective disorder ([place]) 10/26/2015   Has seen counselor, declined medications per medical record from [facility]. hypermanic   Chest pain 07/14/2013   Chronic vaginitis 10/26/2015   Has h/o vag dryness and tried Estrace per [place] records   COVID-19 virus infection 08/24/2018   Depression    Diabetes ([place])    Patient denies   Diverticulitis    no problems   Dysfunctional uterine bleeding 03/02/2011   Elevated hemoglobin A1c 10/26/2015   GERD (gastroesophageal reflux disease)    tums prn   Headache(784.0)    Hemorrhoids    HTN (hypertension)    BP meds since fall 2016   Internal hemorrhoids    Nausea & vomiting 07/14/2013   Obesity 10/26/2015   Rectal bleeding 03/15/2017    Past Surgical History:  Procedure Laterality Date   CHOLECYSTECTOMY N/A  07/15/2013   Procedure: LAPAROSCOPIC CHOLECYSTECTOMY;  Surgeon: Rolm Bookbinder, MD;  Location: [facility] OR;  Service: General;  Laterality: N/A;   COLONOSCOPY     ECTOPIC PREGNANCY SURGERY     laparotomy   HEMORRHOID BANDING     ROBOTIC ASSISTED LAP VAGINAL HYSTERECTOMY      Family History  Problem Relation Age of Onset   Diabetes Mother    Hypertension Mother    Arthritis Father 57   Hypertension Brother    Cancer Brother        blood    Cancer Brother        unsure kind, as a child    CVA Other    Colon cancer Neg Hx    Colon polyps Neg Hx    Esophageal cancer Neg Hx    Rectal cancer Neg Hx    Stomach cancer Neg Hx    Pancreatic cancer Neg Hx     Social History   Socioeconomic History   Marital status: Divorced    Spouse name: Not on file   Number of children: 0   Years of education: Not on file   Highest education level: Not on file  Occupational History   Occupation: Education officer, museum, Chief of Staff  Tobacco Use   Smoking status: Former    Types: Cigarettes    Quit date: 04/18/2008  Years since quitting: 13.0   Smokeless tobacco: Never  Vaping Use   Vaping Use: Never used  Substance and Sexual Activity   Alcohol use: Yes    Comment: 1 glass of wine rarely, occasionally   Drug use: No    Types: Cocaine    Comment: Former cocaine use since 2012   Sexual activity: Yes    Partners: Male    Birth control/protection: Surgical  Other Topics Concern   Not on file  Social History Narrative   Lives alone.     No children, no pets.   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   Social Determinants of Health   Financial Resource Strain: Not on file  Food Insecurity: Not on file  Transportation Needs: Not on file  Physical Activity: Not on file  Stress: Not on file  Social Connections: Not on file  Intimate Partner Violence: Not on file    Outpatient Medications Prior to Visit  Medication Sig Dispense Refill   benzonatate  (TESSALON) 100 MG capsule Take 1 capsule (100 mg total) by mouth every 8 (eight) hours. 21 capsule 0   guaiFENesin (MUCINEX) 600 MG 12 hr tablet Take by mouth daily.     lisinopril-hydrochlorothiazide (ZESTORETIC) 10-12.5 MG tablet Take 1 tablet by mouth daily. 90 tablet 3   neomycin-colistin-hydrocortisone-thonzonium (COLY-MYCIN S) 3.05-07-08-0.5 MG/ML OTIC suspension Place 2 drops into the left ear 3 (three) times daily. 10 mL 0   Zinc 100 MG TABS Take by mouth.     doxycycline (VIBRAMYCIN) 100 MG capsule Take 1 capsule (100 mg total) by mouth 2 (two) times daily for 5 days. First dose given in ED on 04/11/2021 9 capsule 0   atorvastatin (LIPITOR) 10 MG tablet Take 1 tablet by mouth once daily (Patient not taking: Reported on 04/16/2021) 90 tablet 0   Blood Glucose Monitoring Suppl (CONTOUR MONITOR) w/Device KIT 1 kit by Does not apply route 2 (two) times a day. 1 kit 0   CONTOUR NEXT TEST test strip USE 1 STRIP TO CHECK GLUCOSE TWICE DAILY 100 each 0   Microlet Lancets MISC Test blood sugar twice a day 100 each 1   Probiotic Product (PROBIOTIC-10 PO) Take 1 mg by mouth 1 day or 1 dose. Pt. Takes every other day (Patient not taking: Reported on 04/16/2021)     Semaglutide,0.25 or 0.5MG/DOS, (OZEMPIC, 0.25 OR 0.5 MG/DOSE,) 2 MG/1.5ML SOPN Inject 0.5 mg into the skin once a week. (Patient not taking: Reported on 03/04/2021) 1.5 mL 5   valACYclovir (VALTREX) 500 MG tablet Take 1 tablet (500 mg total) by mouth 2 (two) times daily. X 3 days for outbreak. (Patient not taking: Reported on 04/16/2021) 30 tablet 0   No facility-administered medications prior to visit.    No Known Allergies  Review of Systems  Constitutional:  Negative for activity change and chills.  HENT:  Positive for congestion. Negative for voice change.   Eyes:  Negative for pain and redness.  Respiratory:  Positive for cough and wheezing.   Cardiovascular:  Negative for chest pain.  Gastrointestinal:  Positive for anal bleeding.  Negative for constipation, diarrhea, nausea and vomiting.  Endocrine: Negative for polyuria.  Genitourinary:  Negative for frequency.  Skin:  Negative for color change and rash.  Allergic/Immunologic: Negative for immunocompromised state.  Neurological:  Negative for dizziness.  Psychiatric/Behavioral:  Negative for agitation.       Objective:    Physical Exam Vitals and nursing note reviewed.  Constitutional:  General: She is not in acute distress.    Appearance: Normal appearance. She is not ill-appearing.  HENT:     Head: Normocephalic and atraumatic.     Right Ear: External ear normal.     Left Ear: External ear normal.     Nose: No congestion.  Eyes:     Extraocular Movements: Extraocular movements intact.     Conjunctiva/sclera: Conjunctivae normal.     Pupils: Pupils are equal, round, and reactive to light.  Cardiovascular:     Rate and Rhythm: Normal rate and regular rhythm.     Pulses: Normal pulses.     Heart sounds: Normal heart sounds.  Pulmonary:     Effort: Pulmonary effort is normal.     Breath sounds: Normal breath sounds. No wheezing.  Abdominal:     General: Bowel sounds are normal.     Palpations: Abdomen is soft.  Musculoskeletal:     Cervical back: Normal range of motion and neck supple.     Right lower leg: No edema.     Left lower leg: No edema.  Skin:    General: Skin is warm and dry.     Findings: No bruising.  Neurological:     General: No focal deficit present.     Mental Status: She is alert and oriented to person, place, and time.  Psychiatric:        Mood and Affect: Mood normal.        Behavior: Behavior normal.        Thought Content: Thought content normal.    BP 128/78 (BP Location: Right Arm, Patient Position: Sitting)    Pulse 78    Ht '5\' 6"'  (1.676 m)    Wt 186 lb 9.6 oz (84.6 kg)    LMP 04/25/2011    SpO2 97%    BMI 30.12 kg/m  Wt Readings from Last 3 Encounters:  04/16/21 186 lb 9.6 oz (84.6 kg)  04/11/21 198 lb (89.8  kg)  03/04/21 194 lb (88 kg)    Health Maintenance Due  Topic Date Due   Zoster Vaccines- Shingrix (1 of 2) Never done   FOOT EXAM  11/14/2019   COVID-19 Vaccine (4 - Booster for Pfizer series) 04/21/2020   INFLUENZA VACCINE  10/05/2020    There are no preventive care reminders to display for this patient.   Lab Results  Component Value Date   TSH 0.828 08/21/2020   Lab Results  Component Value Date   WBC 7.3 11/11/2020   HGB 12.5 11/11/2020   HCT 37.6 11/11/2020   MCV 89 11/11/2020   PLT 256 11/11/2020   Lab Results  Component Value Date   NA 139 11/11/2020   K 4.8 11/11/2020   CO2 20 11/11/2020   GLUCOSE 147 (H) 11/11/2020   BUN 12 11/11/2020   CREATININE 0.78 11/11/2020   BILITOT 0.6 11/11/2020   ALKPHOS 63 11/11/2020   AST 34 11/11/2020   ALT 30 11/11/2020   PROT 7.6 11/11/2020   ALBUMIN 4.1 11/11/2020   CALCIUM 9.2 11/11/2020   ANIONGAP 8 05/01/2020   EGFR 91 11/11/2020   Lab Results  Component Value Date   CHOL 119 04/24/2020   Lab Results  Component Value Date   HDL 42 04/24/2020   Lab Results  Component Value Date   LDLCALC 63 04/24/2020   Lab Results  Component Value Date   TRIG 67 04/24/2020   Lab Results  Component Value Date   CHOLHDL 2.8 04/24/2020  Lab Results  Component Value Date   HGBA1C 6.5 (A) 12/17/2020       Assessment & Plan:   Problem List Items Addressed This Visit       Cardiovascular and Mediastinum   Essential hypertension     Endocrine   Controlled type 2 diabetes mellitus without complication, without long-term current use of insulin ([place])   Type 2 diabetes mellitus with hyperglycemia, without long-term current use of insulin ([facility])   Relevant Orders   POCT Glucose (Device for Home Use)     Other   History of pneumonia - Primary   Relevant Medications   doxycycline (VIBRAMYCIN) 100 MG capsule   albuterol (VENTOLIN HFA) 108 (90 Base) MCG/ACT inhaler   Other Visit Diagnoses     History of  hemorrhoids       Relevant Orders   Ambulatory referral to Gastroenterology   Wheezing       Relevant Medications   albuterol (VENTOLIN HFA) 108 (90 Base) MCG/ACT inhaler        Meds ordered this encounter  Medications   doxycycline (VIBRAMYCIN) 100 MG capsule    Sig: Take 1 capsule (100 mg total) by mouth 2 (two) times daily for 5 days. First dose given in ED on 04/11/2021    Dispense:  10 capsule    Refill:  0    Order Specific Question:   Supervising Provider    Answer:   Denita Lung [[id number]]   albuterol (VENTOLIN HFA) 108 (90 Base) MCG/ACT inhaler    Sig: Inhale 2 puffs into the lungs every 6 (six) hours as needed for wheezing or shortness of breath.    Dispense:  8 g    Refill:  0    Order Specific Question:   Supervising Provider    Answer:   Denita Lung [[id number]]   Repeat CBC, CXR in 2 weeks at follow up appointment. If your symptoms get worse, please call 911 / EMS for help or go to the Emergency Department for further evaluation   Irene Pap, PA-C

## 2021-04-19 ENCOUNTER — Encounter: Payer: BC Managed Care – PPO | Attending: Medical | Admitting: Registered"

## 2021-04-19 DIAGNOSIS — E1165 Type 2 diabetes mellitus with hyperglycemia: Secondary | ICD-10-CM | POA: Insufficient documentation

## 2021-04-21 ENCOUNTER — Encounter: Payer: Self-pay | Admitting: Physician Assistant

## 2021-04-21 DIAGNOSIS — F319 Bipolar disorder, unspecified: Secondary | ICD-10-CM | POA: Diagnosis not present

## 2021-04-23 DIAGNOSIS — F319 Bipolar disorder, unspecified: Secondary | ICD-10-CM | POA: Diagnosis not present

## 2021-04-26 ENCOUNTER — Other Ambulatory Visit: Payer: Self-pay | Admitting: Physician Assistant

## 2021-04-28 DIAGNOSIS — F319 Bipolar disorder, unspecified: Secondary | ICD-10-CM | POA: Diagnosis not present

## 2021-04-30 ENCOUNTER — Other Ambulatory Visit: Payer: Self-pay

## 2021-04-30 ENCOUNTER — Ambulatory Visit
Admission: RE | Admit: 2021-04-30 | Discharge: 2021-04-30 | Disposition: A | Discharge status: Home / Self Care | Payer: BC Managed Care – PPO | Source: Ambulatory Visit | Attending: Physician Assistant | Admitting: Physician Assistant

## 2021-04-30 ENCOUNTER — Ambulatory Visit: Payer: BC Managed Care – PPO | Admitting: Physician Assistant

## 2021-04-30 ENCOUNTER — Encounter: Payer: Self-pay | Admitting: Physician Assistant

## 2021-04-30 VITALS — BP 122/78 | HR 78 | Temp 98.6°F | Ht 66.0 in | Wt 187.6 lb

## 2021-04-30 DIAGNOSIS — N951 Menopausal and female climacteric states: Secondary | ICD-10-CM

## 2021-04-30 DIAGNOSIS — I10 Essential (primary) hypertension: Secondary | ICD-10-CM

## 2021-04-30 DIAGNOSIS — Z8701 Personal history of pneumonia (recurrent): Secondary | ICD-10-CM

## 2021-04-30 DIAGNOSIS — E1165 Type 2 diabetes mellitus with hyperglycemia: Secondary | ICD-10-CM | POA: Diagnosis not present

## 2021-04-30 HISTORY — DX: Menopausal and female climacteric states: N95.1

## 2021-04-30 NOTE — Patient Instructions (Addendum)
Diagnostic Radiology and Imaging  [facility] [address] [place], [place] [address]  Phone [phone] Fax [phone]  Hours of Operation General hours of operation are Monday - Friday, 8 am-5 pm   ________________________________________________________  In the early 40s to early 28s women can have perimenopausal symptoms as hormone levels in the body change. The symptoms can start gradually or all of a sudden at any age and can last for a various amount of time, from months to years. There is no quick fix for these symptoms.  Each person can have any or none of the following symptoms: Fatigue Hot flashes  Night sweats Trouble falling and staying asleep Mood swings Brain fog Aches and pains Heart palpitations Sore breasts Weight gain Dry, itchy skin Decreased sexual drive Headaches Overthinking Anxiety Depression  You can try over the counter supplements: Wild yam Evening primrose oil Ashwagandha  You can also follow up with an ObGyn to have your hormones checked.

## 2021-04-30 NOTE — Progress Notes (Signed)
Acute Office Visit  Subjective:    Patient ID: Deborah Haney, female    DOB: Mar 28, 1965, 56 y.o.   MRN: [id number]  Chief Complaint  Patient presents with   other    2 week f/u on pneumonia feels a lot better feels a little tired, has slight cough, hot flashes started, sweets at night badly not sleeping well either.     HPI Patient is in today for a follow up appointment. Albuterol helps occasional cough and wheezing and she feels like her pneumonia is getting better; still reports some fatigue, but is ready to go back to work full-time. States she works at a [employer], was exposed to TB, had testing done with the [facility] that was negative.  Also states she has some hot flashes and wonders if she is going through menopause.   Past Medical History:  Diagnosis Date   Acute cholecystitis 07/14/2013   Acute on chronic respiratory failure with hypoxia ([place]) 09/05/2018   Anemia    has history, takes iron   Anxiety    Bipolar affective disorder ([place]) 10/26/2015   Has seen counselor, declined medications per medical record from [facility]. hypermanic   Chest pain 07/14/2013   Chronic vaginitis 10/26/2015   Has h/o vag dryness and tried Estrace per [place] records   COVID-19 virus infection 08/24/2018   Depression    Diabetes ([place])    Patient denies   Diverticulitis    no problems   Dysfunctional uterine bleeding 03/02/2011   Elevated hemoglobin A1c 10/26/2015   GERD (gastroesophageal reflux disease)    tums prn   Headache(784.0)    Hemorrhoids    HTN (hypertension)    BP meds since fall 2016   Internal hemorrhoids    Nausea & vomiting 07/14/2013   Obesity 10/26/2015   Rectal bleeding 03/15/2017    Past Surgical History:  Procedure Laterality Date   CHOLECYSTECTOMY N/A 07/15/2013   Procedure: LAPAROSCOPIC CHOLECYSTECTOMY;  Surgeon: Rolm Bookbinder, MD;  Location: [facility] OR;  Service: General;  Laterality: N/A;   COLONOSCOPY     ECTOPIC PREGNANCY SURGERY     laparotomy    HEMORRHOID BANDING     ROBOTIC ASSISTED LAP VAGINAL HYSTERECTOMY      Family History  Problem Relation Age of Onset   Diabetes Mother    Hypertension Mother    Arthritis Father 82   Hypertension Brother    Cancer Brother        blood    Cancer Brother        unsure kind, as a child    CVA Other    Colon cancer Neg Hx    Colon polyps Neg Hx    Esophageal cancer Neg Hx    Rectal cancer Neg Hx    Stomach cancer Neg Hx    Pancreatic cancer Neg Hx     Social History   Socioeconomic History   Marital status: Divorced    Spouse name: Not on file   Number of children: 0   Years of education: Not on file   Highest education level: Not on file  Occupational History   Occupation: Education officer, museum, Chief of Staff  Tobacco Use   Smoking status: Former    Types: Cigarettes    Quit date: 04/18/2008    Years since quitting: 13.0   Smokeless tobacco: Never  Vaping Use   Vaping Use: Never used  Substance and Sexual Activity   Alcohol use: Yes    Comment: 1 glass of  wine rarely, occasionally   Drug use: No    Types: Cocaine    Comment: Former cocaine use since 2012   Sexual activity: Yes    Partners: Male    Birth control/protection: Surgical  Other Topics Concern   Not on file  Social History Narrative   Lives alone.     No children, no pets.   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   Social Determinants of Health   Financial Resource Strain: Not on file  Food Insecurity: Not on file  Transportation Needs: Not on file  Physical Activity: Not on file  Stress: Not on file  Social Connections: Not on file  Intimate Partner Violence: Not on file    Outpatient Medications Prior to Visit  Medication Sig Dispense Refill   Accu-Chek Softclix Lancets lancets Use as instructed;  Check FSBS BID - Include strips # 50 with 5 refills, Lancets #50 with 5 refills DX: Type 2 DM - ICD 10: E11.9 100 each 12   albuterol (VENTOLIN HFA) 108 (90 Base) MCG/ACT  inhaler Inhale 2 puffs into the lungs every 6 (six) hours as needed for wheezing or shortness of breath. 18 g 0   Blood Glucose Monitoring Suppl (CONTOUR MONITOR) w/Device KIT 1 kit by Does not apply route 2 (two) times a day. 1 kit 0   CONTOUR NEXT TEST test strip USE 1 STRIP TO CHECK GLUCOSE TWICE DAILY 100 each 0   lisinopril-hydrochlorothiazide (ZESTORETIC) 10-12.5 MG tablet Take 1 tablet by mouth daily. 90 tablet 3   neomycin-colistin-hydrocortisone-thonzonium (COLY-MYCIN S) 3.05-07-08-0.5 MG/ML OTIC suspension Place 2 drops into the left ear 3 (three) times daily. 10 mL 0   Probiotic Product (PROBIOTIC-10 PO) Take 1 mg by mouth 1 day or 1 dose. Pt. Takes every other day     valACYclovir (VALTREX) 500 MG tablet Take 1 tablet (500 mg total) by mouth 2 (two) times daily. X 3 days for outbreak. 30 tablet 0   Zinc 100 MG TABS Take by mouth.     atorvastatin (LIPITOR) 10 MG tablet Take 1 tablet by mouth once daily (Patient not taking: Reported on 04/30/2021) 90 tablet 0   benzonatate (TESSALON) 100 MG capsule Take 1 capsule (100 mg total) by mouth every 8 (eight) hours. (Patient not taking: Reported on 04/30/2021) 21 capsule 0   guaiFENesin (MUCINEX) 600 MG 12 hr tablet Take by mouth daily. (Patient not taking: Reported on 04/30/2021)     Semaglutide,0.25 or 0.5MG /DOS, (OZEMPIC, 0.25 OR 0.5 MG/DOSE,) 2 MG/1.5ML SOPN Inject 0.5 mg into the skin once a week. (Patient not taking: Reported on 03/04/2021) 1.5 mL 5   No facility-administered medications prior to visit.    No Known Allergies  Review of Systems  Constitutional:  Negative for activity change and chills.  HENT:  Negative for congestion and voice change.   Eyes:  Negative for pain and redness.  Respiratory:  Positive for cough and wheezing.   Cardiovascular:  Negative for chest pain.  Gastrointestinal:  Negative for constipation, diarrhea, nausea and vomiting.  Endocrine: Negative for polyuria.  Genitourinary:  Negative for frequency.   Skin:  Negative for color change and rash.  Allergic/Immunologic: Negative for immunocompromised state.  Neurological:  Negative for dizziness.  Psychiatric/Behavioral:  Negative for agitation.       Objective:    Physical Exam Vitals and nursing note reviewed.  Constitutional:      General: She is not in acute distress.    Appearance: Normal appearance.  She is not ill-appearing.  HENT:     Head: Normocephalic and atraumatic.     Right Ear: External ear normal.     Left Ear: External ear normal.     Nose: No congestion.  Eyes:     Extraocular Movements: Extraocular movements intact.     Conjunctiva/sclera: Conjunctivae normal.     Pupils: Pupils are equal, round, and reactive to light.  Cardiovascular:     Rate and Rhythm: Normal rate and regular rhythm.     Pulses: Normal pulses.     Heart sounds: Normal heart sounds.  Pulmonary:     Effort: Pulmonary effort is normal.     Breath sounds: Normal breath sounds. No wheezing.  Abdominal:     General: Bowel sounds are normal.     Palpations: Abdomen is soft.  Musculoskeletal:     Cervical back: Normal range of motion and neck supple.     Right lower leg: No edema.     Left lower leg: No edema.  Skin:    General: Skin is warm and dry.     Findings: No bruising.  Neurological:     General: No focal deficit present.     Mental Status: She is alert and oriented to person, place, and time.  Psychiatric:        Mood and Affect: Mood normal.        Behavior: Behavior normal.        Thought Content: Thought content normal.    BP 122/78    Pulse 78    Temp 98.6 F (37 C)    Wt 187 lb 9.6 oz (85.1 kg)    LMP 04/25/2011    BMI 30.28 kg/m   Wt Readings from Last 3 Encounters:  04/30/21 187 lb 9.6 oz (85.1 kg)  04/16/21 186 lb 9.6 oz (84.6 kg)  04/11/21 198 lb (89.8 kg)    Health Maintenance Due  Topic Date Due   Zoster Vaccines- Shingrix (1 of 2) Never done   FOOT EXAM  11/14/2019   COVID-19 Vaccine (4 - Booster for  Pfizer series) 04/21/2020   INFLUENZA VACCINE  10/05/2020    There are no preventive care reminders to display for this patient.   Lab Results  Component Value Date   TSH 0.828 08/21/2020   Lab Results  Component Value Date   WBC 7.3 11/11/2020   HGB 12.5 11/11/2020   HCT 37.6 11/11/2020   MCV 89 11/11/2020   PLT 256 11/11/2020   Lab Results  Component Value Date   NA 139 11/11/2020   K 4.8 11/11/2020   CO2 20 11/11/2020   GLUCOSE 147 (H) 11/11/2020   BUN 12 11/11/2020   CREATININE 0.78 11/11/2020   BILITOT 0.6 11/11/2020   ALKPHOS 63 11/11/2020   AST 34 11/11/2020   ALT 30 11/11/2020   PROT 7.6 11/11/2020   ALBUMIN 4.1 11/11/2020   CALCIUM 9.2 11/11/2020   ANIONGAP 8 05/01/2020   EGFR 91 11/11/2020   Lab Results  Component Value Date   CHOL 119 04/24/2020   Lab Results  Component Value Date   HDL 42 04/24/2020   Lab Results  Component Value Date   LDLCALC 63 04/24/2020   Lab Results  Component Value Date   TRIG 67 04/24/2020   Lab Results  Component Value Date   CHOLHDL 2.8 04/24/2020   Lab Results  Component Value Date   HGBA1C 6.5 (A) 12/17/2020       Assessment & Plan:  Problem List Items Addressed This Visit       Cardiovascular and Mediastinum   Essential hypertension     Endocrine   Type 2 diabetes mellitus with hyperglycemia, without long-term current use of insulin (H[facility])     Other   History of pneumonia - Primary   Relevant Orders   DG Chest 2 View   Perimenopausal symptom     No orders of the defined types were placed in this encounter.  Repeat chest x-ray ordered. Note given to return to work full duty. Information about perimenopause given and can follow up with an ObGyn as needed.   Return at already scheduled annual exam.  Irene Pap, PA-C

## 2021-05-12 DIAGNOSIS — F319 Bipolar disorder, unspecified: Secondary | ICD-10-CM | POA: Diagnosis not present

## 2021-05-13 ENCOUNTER — Ambulatory Visit: Payer: BC Managed Care – PPO | Admitting: Physician Assistant

## 2021-05-13 ENCOUNTER — Other Ambulatory Visit: Payer: Self-pay

## 2021-05-13 ENCOUNTER — Ambulatory Visit (HOSPITAL_COMMUNITY)
Admission: EM | Admit: 2021-05-13 | Discharge: 2021-05-13 | Disposition: A | Discharge status: Home / Self Care | Payer: BC Managed Care – PPO | Attending: Internal Medicine | Admitting: Internal Medicine

## 2021-05-13 ENCOUNTER — Encounter: Payer: Self-pay | Admitting: Physician Assistant

## 2021-05-13 ENCOUNTER — Encounter (HOSPITAL_COMMUNITY): Payer: Self-pay

## 2021-05-13 VITALS — BP 130/90 | HR 80 | Ht 66.0 in | Wt 193.0 lb

## 2021-05-13 DIAGNOSIS — L0231 Cutaneous abscess of buttock: Secondary | ICD-10-CM

## 2021-05-13 DIAGNOSIS — I10 Essential (primary) hypertension: Secondary | ICD-10-CM | POA: Diagnosis not present

## 2021-05-13 DIAGNOSIS — E1165 Type 2 diabetes mellitus with hyperglycemia: Secondary | ICD-10-CM

## 2021-05-13 DIAGNOSIS — Z90710 Acquired absence of both cervix and uterus: Secondary | ICD-10-CM | POA: Diagnosis not present

## 2021-05-13 DIAGNOSIS — L03317 Cellulitis of buttock: Secondary | ICD-10-CM

## 2021-05-13 DIAGNOSIS — N951 Menopausal and female climacteric states: Secondary | ICD-10-CM | POA: Insufficient documentation

## 2021-05-13 MED ORDER — TRAMADOL HCL 50 MG PO TABS
50.0000 mg | ORAL_TABLET | Freq: Two times a day (BID) | ORAL | 0 refills | Status: DC | PRN
Start: 2021-05-13 — End: 2021-07-07

## 2021-05-13 MED ORDER — LIDOCAINE-EPINEPHRINE 1 %-1:100000 IJ SOLN
INTRAMUSCULAR | Status: AC
  Filled 2021-05-13: qty 1

## 2021-05-13 MED ORDER — MUPIROCIN CALCIUM 2 % EX CREA: 1.0000 "application " | TOPICAL_CREAM | Freq: Two times a day (BID) | CUTANEOUS | 1 refills | Status: AC

## 2021-05-13 MED ORDER — SULFAMETHOXAZOLE-TRIMETHOPRIM 800-160 MG PO TABS: 1.0000 | ORAL_TABLET | Freq: Two times a day (BID) | ORAL | 0 refills | Status: AC

## 2021-05-13 MED ORDER — METRONIDAZOLE 500 MG PO TABS: 500.0000 mg | ORAL_TABLET | Freq: Three times a day (TID) | ORAL | 0 refills | Status: AC

## 2021-05-13 NOTE — ED Triage Notes (Signed)
Over one week h/o abscess on left buttock. Pt was seen earlier today by her PCP, and given bactrim and a cream. Pt reports that her sxs have significantly worsened noting that walking and sitting aggravates sxs. Pt describes the abscess as red and having "two heads", but she is unable to get a full view. No meds taken. ?

## 2021-05-13 NOTE — Progress Notes (Signed)
Acute Office Visit  Subjective:    Patient ID: Deborah Haney, female    DOB: 28-Mar-1965, 56 y.o.   MRN: [id number]  Chief Complaint  Patient presents with   Acute Visit    Pt has a boil on left butt cheek towards the middle    HPI Patient is in today for a boil on her left buttock for several days and states it is getting very painful;  Denies fever / chills / nausea / vomiting / diarrhea / constipation.   Past Medical History:  Diagnosis Date   Acute cholecystitis 07/14/2013   Acute on chronic respiratory failure with hypoxia ([facility]) 09/05/2018   Anemia    has history, takes iron   Anxiety    Bipolar affective disorder ([facility]) 10/26/2015   Has seen counselor, declined medications per medical record from [facility]. hypermanic   Chest pain 07/14/2013   Chronic vaginitis 10/26/2015   Has h/o vag dryness and tried Estrace per [place] records   COVID-19 virus infection 08/24/2018   Depression    Diabetes ([facility])    Patient denies   Diverticulitis    no problems   Dysfunctional uterine bleeding 03/02/2011   Elevated hemoglobin A1c 10/26/2015   GERD (gastroesophageal reflux disease)    tums prn   Headache(784.0)    Hemorrhoids    HTN (hypertension)    BP meds since fall 2016   Internal hemorrhoids    Nausea & vomiting 07/14/2013   Obesity 10/26/2015   Perimenopausal symptom 04/30/2021   Rectal bleeding 03/15/2017    Past Surgical History:  Procedure Laterality Date   CHOLECYSTECTOMY N/A 07/15/2013   Procedure: LAPAROSCOPIC CHOLECYSTECTOMY;  Surgeon: Emelia Loron, MD;  Location: [facility] OR;  Service: General;  Laterality: N/A;   COLONOSCOPY     ECTOPIC PREGNANCY SURGERY     laparotomy   HEMORRHOID BANDING     ROBOTIC ASSISTED LAP VAGINAL HYSTERECTOMY      Family History  Problem Relation Age of Onset   Diabetes Mother    Hypertension Mother    Arthritis Father 46   Hypertension Brother    Cancer Brother        blood    Cancer Brother        unsure kind, as a child    CVA Other     Colon cancer Neg Hx    Colon polyps Neg Hx    Esophageal cancer Neg Hx    Rectal cancer Neg Hx    Stomach cancer Neg Hx    Pancreatic cancer Neg Hx     Social History   Socioeconomic History   Marital status: Divorced    Spouse name: Not on file   Number of children: 0   Years of education: Not on file   Highest education level: Not on file  Occupational History   Occupation: Child psychotherapist, Dealer  Tobacco Use   Smoking status: Former    Types: Cigarettes    Quit date: 04/18/2008    Years since quitting: 13.0   Smokeless tobacco: Never  Vaping Use   Vaping Use: Never used  Substance and Sexual Activity   Alcohol use: Yes    Comment: 1 glass of wine rarely, occasionally   Drug use: No    Types: Cocaine    Comment: Former cocaine use since 2012   Sexual activity: Yes    Partners: Male    Birth control/protection: Surgical  Other Topics Concern   Not on file  Social History Narrative  Lives alone.     No children, no pets.   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   Social Determinants of Health   Financial Resource Strain: Not on file  Food Insecurity: Not on file  Transportation Needs: Not on file  Physical Activity: Not on file  Stress: Not on file  Social Connections: Not on file  Intimate Partner Violence: Not on file    Outpatient Medications Prior to Visit  Medication Sig Dispense Refill   Accu-Chek Softclix Lancets lancets Use as instructed;  Check FSBS BID - Include strips # 50 with 5 refills, Lancets #50 with 5 refills DX: Type 2 DM - ICD 10: E11.9 100 each 12   albuterol (VENTOLIN HFA) 108 (90 Base) MCG/ACT inhaler Inhale 2 puffs into the lungs every 6 (six) hours as needed for wheezing or shortness of breath. 18 g 0   Blood Glucose Monitoring Suppl (CONTOUR MONITOR) w/Device KIT 1 kit by Does not apply route 2 (two) times a day. 1 kit 0   CONTOUR NEXT TEST test strip USE 1 STRIP TO CHECK GLUCOSE TWICE DAILY  100 each 0   lisinopril-hydrochlorothiazide (ZESTORETIC) 10-12.5 MG tablet Take 1 tablet by mouth daily. 90 tablet 3   neomycin-colistin-hydrocortisone-thonzonium (COLY-MYCIN S) 3.05-07-08-0.5 MG/ML OTIC suspension Place 2 drops into the left ear 3 (three) times daily. 10 mL 0   Probiotic Product (PROBIOTIC-10 PO) Take 1 mg by mouth 1 day or 1 dose. Pt. Takes every other day     valACYclovir (VALTREX) 500 MG tablet Take 1 tablet (500 mg total) by mouth 2 (two) times daily. X 3 days for outbreak. 30 tablet 0   Zinc 100 MG TABS Take by mouth.     atorvastatin (LIPITOR) 10 MG tablet Take 1 tablet by mouth once daily (Patient not taking: Reported on 04/30/2021) 90 tablet 0   benzonatate (TESSALON) 100 MG capsule Take 1 capsule (100 mg total) by mouth every 8 (eight) hours. (Patient not taking: Reported on 04/30/2021) 21 capsule 0   guaiFENesin (MUCINEX) 600 MG 12 hr tablet Take by mouth daily. (Patient not taking: Reported on 04/30/2021)     Semaglutide,0.25 or 0.5MG /DOS, (OZEMPIC, 0.25 OR 0.5 MG/DOSE,) 2 MG/1.5ML SOPN Inject 0.5 mg into the skin once a week. (Patient not taking: Reported on 03/04/2021) 1.5 mL 5   No facility-administered medications prior to visit.    No Known Allergies  Review of Systems  Constitutional:  Negative for activity change and chills.  HENT:  Negative for congestion and voice change.   Eyes:  Negative for pain and redness.  Respiratory:  Negative for cough and wheezing.   Cardiovascular:  Negative for chest pain.  Gastrointestinal:  Negative for constipation, diarrhea, nausea and vomiting.  Endocrine: Negative for polyuria.  Genitourinary:  Negative for frequency.  Skin:  Positive for color change and wound. Negative for rash.  Allergic/Immunologic: Negative for immunocompromised state.  Neurological:  Negative for dizziness.  Psychiatric/Behavioral:  Negative for agitation.       Objective:    Physical Exam Vitals and nursing note reviewed.  Constitutional:       General: She is not in acute distress.    Appearance: Normal appearance. She is not ill-appearing.  HENT:     Head: Normocephalic and atraumatic.     Right Ear: External ear normal.     Left Ear: External ear normal.     Nose: No congestion.  Eyes:     Extraocular Movements: Extraocular movements intact.  Conjunctiva/sclera: Conjunctivae normal.     Pupils: Pupils are equal, round, and reactive to light.  Pulmonary:     Breath sounds: No wheezing.  Musculoskeletal:     Cervical back: Normal range of motion and neck supple.     Right lower leg: No edema.     Left lower leg: No edema.  Skin:    General: Skin is warm and dry.     Findings: Erythema present. No bruising.     Comments: Very tender epidermal cyst on left inner buttock cheek, minimal surrounding erythema and edema, no current discharge  Neurological:     General: No focal deficit present.     Mental Status: She is alert and oriented to person, place, and time.  Psychiatric:        Mood and Affect: Mood normal.        Behavior: Behavior normal.        Thought Content: Thought content normal.    BP 130/90   Pulse 80   Wt 193 lb (87.5 kg)   LMP 04/25/2011   SpO2 99%   BMI 31.15 kg/m   Wt Readings from Last 3 Encounters:  05/13/21 193 lb (87.5 kg)  04/30/21 187 lb 9.6 oz (85.1 kg)  04/16/21 186 lb 9.6 oz (84.6 kg)    Health Maintenance Due  Topic Date Due   Zoster Vaccines- Shingrix (1 of 2) Never done   FOOT EXAM  11/14/2019   COVID-19 Vaccine (4 - Booster for Pfizer series) 04/21/2020   INFLUENZA VACCINE  10/05/2020    There are no preventive care reminders to display for this patient.   Lab Results  Component Value Date   TSH 0.828 08/21/2020   Lab Results  Component Value Date   WBC 7.3 11/11/2020   HGB 12.5 11/11/2020   HCT 37.6 11/11/2020   MCV 89 11/11/2020   PLT 256 11/11/2020   Lab Results  Component Value Date   NA 139 11/11/2020   K 4.8 11/11/2020   CO2 20 11/11/2020    GLUCOSE 147 (H) 11/11/2020   BUN 12 11/11/2020   CREATININE 0.78 11/11/2020   BILITOT 0.6 11/11/2020   ALKPHOS 63 11/11/2020   AST 34 11/11/2020   ALT 30 11/11/2020   PROT 7.6 11/11/2020   ALBUMIN 4.1 11/11/2020   CALCIUM 9.2 11/11/2020   ANIONGAP 8 05/01/2020   EGFR 91 11/11/2020   Lab Results  Component Value Date   CHOL 119 04/24/2020   Lab Results  Component Value Date   HDL 42 04/24/2020   Lab Results  Component Value Date   LDLCALC 63 04/24/2020   Lab Results  Component Value Date   TRIG 67 04/24/2020   Lab Results  Component Value Date   CHOLHDL 2.8 04/24/2020   Lab Results  Component Value Date   HGBA1C 6.5 (A) 12/17/2020       Assessment & Plan:   Problem List Items Addressed This Visit       Cardiovascular and Mediastinum   Essential hypertension     Endocrine   Type 2 diabetes mellitus with hyperglycemia, without long-term current use of insulin (HCC)     Other   H/O: hysterectomy   Menopausal symptoms   Other Visit Diagnoses     Cellulitis and abscess of buttock    -  Primary   Relevant Medications   sulfamethoxazole-trimethoprim (BACTRIM DS) 800-160 MG tablet   mupirocin cream (BACTROBAN) 2 %        Meds  ordered this encounter  Medications   sulfamethoxazole-trimethoprim (BACTRIM DS) 800-160 MG tablet    Sig: Take 1 tablet by mouth 2 (two) times daily for 14 days.    Dispense:  28 tablet    Refill:  0    Order Specific Question:   Supervising Provider    Answer:   Ronnald Nian [[id number]]   mupirocin cream (BACTROBAN) 2 %    Sig: Apply 1 application. topically 2 (two) times daily.    Dispense:  15 g    Refill:  1    Order Specific Question:   Supervising Provider    Answer:   Ronnald Nian [[id number]]   Return as already scheduled.   Jake Shark, PA-C

## 2021-05-13 NOTE — Discharge Instructions (Addendum)
Please continue sitz bath's ?Please remove packing if it is still in place 48 hours from today ?Take medications as prescribed ?If you have worsening pain, swelling, discharge or fever please return to the urgent care to be reevaluated. ?

## 2021-05-14 ENCOUNTER — Encounter: Payer: Self-pay | Admitting: Physician Assistant

## 2021-05-14 DIAGNOSIS — L0231 Cutaneous abscess of buttock: Secondary | ICD-10-CM | POA: Diagnosis not present

## 2021-05-14 NOTE — ED Provider Notes (Signed)
[place]    CSN: [id number] Arrival date & time: 05/13/21  1930      History   Chief Complaint Chief Complaint  Patient presents with   Abscess    buttock    HPI Deborah Haney is a 56 y.o. female comes to the urgent care with painful swelling in the left 1 week duration.  Patient says the pain has been worsening particularly over the past couple of days.  Pain is currently 10 out of 10, throbbing, aggravated by palpation and walking.  No known relieving factors.  No fever or chills.  Patient was seen by primary care provider today.  Primary care provider prescribed antibiotics for the patient and recommended follow up if symptoms worsens.  Patient came to the urgent care because the pain was unbearable.  No attempts were made to drain the abscess in the primary care physician's office.  HPI  Past Medical History:  Diagnosis Date   Acute cholecystitis 07/14/2013   Acute on chronic respiratory failure with hypoxia ([place]) 09/05/2018   Anemia    has history, takes iron   Anxiety    Bipolar affective disorder ([place]) 10/26/2015   Has seen counselor, declined medications per medical record from [facility]. hypermanic   Chest pain 07/14/2013   Chronic vaginitis 10/26/2015   Has h/o vag dryness and tried Estrace per [place] records   COVID-19 virus infection 08/24/2018   Depression    Diabetes ([place])    Patient denies   Diverticulitis    no problems   Dysfunctional uterine bleeding 03/02/2011   Elevated hemoglobin A1c 10/26/2015   GERD (gastroesophageal reflux disease)    tums prn   Headache(784.0)    Hemorrhoids    HTN (hypertension)    BP meds since fall 2016   Internal hemorrhoids    Nausea & vomiting 07/14/2013   Obesity 10/26/2015   Perimenopausal symptom 04/30/2021   Rectal bleeding 03/15/2017    Patient Active Problem List   Diagnosis Date Noted   H/O: hysterectomy 05/13/2021   Menopausal symptoms 05/13/2021   Elevated blood sugar 12/17/2020   Screening for heart  disease 12/17/2020   Type 2 diabetes mellitus with hyperglycemia, without long-term current use of insulin ([place]) 12/17/2020   Mixed dyslipidemia 12/17/2020   Situational depression 04/24/2020   Vitamin D deficiency 04/24/2020   Burning with urination 09/25/2019   Frequent urination 09/25/2019   Gross hematuria 09/25/2019   Internal bleeding hemorrhoids 08/30/2019   Paresthesia of both lower extremities 11/14/2018   Elevated LDL cholesterol level 10/17/2018   Proteinuria 10/17/2018   History of 2019 novel coronavirus disease (COVID-19) 09/24/2018   History of pneumonia 09/24/2018   Controlled type 2 diabetes mellitus without complication, without long-term current use of insulin ([place]) 09/11/2018   Allergic rhinitis due to pollen 06/12/2018   Dyslipidemia 06/05/2017   Essential hypertension 10/26/2015   Obesity (BMI 30.0-34.9) 10/26/2015   Chronic vaginitis 10/26/2015   Bipolar affective disorder ([place]) 10/26/2015   Chest pain 07/14/2013   Dysfunctional uterine bleeding 03/02/2011    Past Surgical History:  Procedure Laterality Date   CHOLECYSTECTOMY N/A 07/15/2013   Procedure: LAPAROSCOPIC CHOLECYSTECTOMY;  Surgeon: Rolm Bookbinder, MD;  Location: [facility];  Service: General;  Laterality: N/A;   COLONOSCOPY     ECTOPIC PREGNANCY SURGERY     laparotomy   HEMORRHOID BANDING     ROBOTIC ASSISTED LAP VAGINAL HYSTERECTOMY      OB History     Gravida  1   Para  0   Term  0   Preterm  0   AB  1   Living  0      SAB  0   IAB  0   Ectopic  1   Multiple  0   Live Births               Home Medications    Prior to Admission medications   Medication Sig Start Date End Date Taking? Authorizing Provider  metroNIDAZOLE (FLAGYL) 500 MG tablet Take 1 tablet (500 mg total) by mouth 3 (three) times daily for 7 days. 05/13/21 05/20/21 Yes Harbert Fitterer, Myrene Galas, MD  traMADol (ULTRAM) 50 MG tablet Take 1 tablet (50 mg total) by mouth every 12 (twelve) hours as needed. 05/13/21   Yes Andruw Battie, Myrene Galas, MD  Accu-Chek Softclix Lancets lancets Use as instructed;  Check FSBS BID - Include strips # 50 with 5 refills, Lancets #50 with 5 refills DX: Type 2 DM - ICD 10: E11.9 04/16/21   Irene Pap, PA-C  albuterol (VENTOLIN HFA) 108 (90 Base) MCG/ACT inhaler Inhale 2 puffs into the lungs every 6 (six) hours as needed for wheezing or shortness of breath. 04/16/21 05/16/21  Irene Pap, PA-C  Blood Glucose Monitoring Suppl (CONTOUR MONITOR) w/Device KIT 1 kit by Does not apply route 2 (two) times a day. 07/02/18   Henson, Vickie L, PA-C  CONTOUR NEXT TEST test strip USE 1 STRIP TO CHECK GLUCOSE TWICE DAILY 02/27/20   Henson, Vickie L, PA-C  lisinopril-hydrochlorothiazide (ZESTORETIC) 10-12.5 MG tablet Take 1 tablet by mouth daily. 12/17/20   Tysinger, Camelia Eng, PA-C  mupirocin cream (BACTROBAN) 2 % Apply 1 application. topically 2 (two) times daily. 05/13/21   Irene Pap, PA-C  neomycin-colistin-hydrocortisone-thonzonium (COLY-MYCIN S) 3.05-07-08-0.5 MG/ML OTIC suspension Place 2 drops into the left ear 3 (three) times daily. 09/28/20   Denita Lung, MD  Probiotic Product (PROBIOTIC-10 PO) Take 1 mg by mouth 1 day or 1 dose. Pt. Takes every other day    [provider]  sulfamethoxazole-trimethoprim (BACTRIM DS) 800-160 MG tablet Take 1 tablet by mouth 2 (two) times daily for 14 days. 05/13/21 05/27/21  Irene Pap, PA-C  valACYclovir (VALTREX) 500 MG tablet Take 1 tablet (500 mg total) by mouth 2 (two) times daily. X 3 days for outbreak. 11/11/20   Henson, Vickie L, PA-C  Zinc 100 MG TABS Take by mouth.    [provider]    Family History Family History  Problem Relation Age of Onset   Diabetes Mother    Hypertension Mother    Arthritis Father 73   Hypertension Brother    Cancer Brother        blood    Cancer Brother        unsure kind, as a child    CVA Other    Colon cancer Neg Hx    Colon polyps Neg Hx    Esophageal cancer Neg Hx     Rectal cancer Neg Hx    Stomach cancer Neg Hx    Pancreatic cancer Neg Hx     Social History Social History   Tobacco Use   Smoking status: Former    Types: Cigarettes    Quit date: 04/18/2008    Years since quitting: 13.0   Smokeless tobacco: Never  Vaping Use   Vaping Use: Never used  Substance Use Topics   Alcohol use: Yes    Comment: 1 glass of wine rarely,  occasionally   Drug use: No    Types: Cocaine    Comment: Former cocaine use since 2012     Allergies   Patient has no known allergies.   Review of Systems Review of Systems  Constitutional: Negative.   Gastrointestinal: Negative.   Musculoskeletal:  Positive for myalgias.  Skin:  Positive for color change.  Neurological: Negative.     Physical Exam Triage Vital Signs ED Triage Vitals  Enc Vitals Group     BP 05/13/21 2004 (!) 152/91     Pulse Rate 05/13/21 2004 87     Resp 05/13/21 2004 20     Temp 05/13/21 2004 98.3 F (36.8 C)     Temp Source 05/13/21 2004 Oral     SpO2 05/13/21 2004 98 %     Weight --      Height --      Head Circumference --      Peak Flow --      Pain Score 05/13/21 2002 10     Pain Loc --      Pain Edu? --      Excl. in [place]? --    No data found.  Updated Vital Signs BP (!) 152/91 (BP Location: Right Arm) Comment: BP meds not taken today   Pulse 87    Temp 98.3 F (36.8 C) (Oral)    Resp 20    LMP 04/25/2011    SpO2 98%   Visual Acuity Right Eye Distance:   Left Eye Distance:   Bilateral Distance:    Right Eye Near:   Left Eye Near:    Bilateral Near:     Physical Exam Vitals and nursing note reviewed.  Constitutional:      General: She is in acute distress.  Cardiovascular:     Rate and Rhythm: Normal rate and regular rhythm.     Pulses: Normal pulses.     Heart sounds: Normal heart sounds.  Musculoskeletal:     Comments: Fluctuant swelling on the medial aspect of the left buttocks.  Fluctuance measures about 2 inches in the longest diameter surrounded by  induration. Indurated area measures about 4 inches in the longest diameter. The area is warm to touch..No discharge noted.  Neurological:     Mental Status: She is alert.     UC Treatments / Results  Labs (all labs ordered are listed, but only abnormal results are displayed) Labs Reviewed - No data to display  EKG   Radiology No results found.  Procedures Incision and Drainage  Date/Time: 05/14/2021 3:52 PM Performed by: Chase Picket, MD Authorized by: Chase Picket, MD   Consent:    Consent obtained:  Verbal   Consent given by:  Patient   Risks discussed:  Bleeding and incomplete drainage Universal protocol:    Patient identity confirmed:  Verbally with patient Location:    Type:  Abscess   Location:  Anogenital   Anogenital location:  Gluteal cleft Pre-procedure details:    Skin preparation:  Povidone-iodine Anesthesia:    Anesthesia method:  Local infiltration   Local anesthetic:  Lidocaine 2% WITH epi Procedure type:    Complexity:  Complex Procedure details:    Incision types:  Single straight and stab incision   Incision depth:  Subcutaneous   Wound management:  Probed and deloculated   Drainage:  Bloody and purulent   Drainage amount:  Moderate   Packing materials:  1/4 in iodoform gauze   Amount 1/4" iodoform:  7 inches Post-procedure details:    Procedure completion:  Tolerated well, no immediate complications (including critical care time)  Medications Ordered in UC Medications - No data to display  Initial Impression / Assessment and Plan / UC Course  I have reviewed the triage vital signs and the nursing notes.  Pertinent labs & imaging results that were available during my care of the patient were reviewed by me and considered in my medical decision making (see chart for details).     1.  Gluteal Cleft abscess: Incision and drainage completed Incision and drainage aftercare teaching given to the patient I will add metronidazole to  the current treatment regimen. Patient was prescribed Bactrim at the primary care visit Tramadol as needed for pain Return precautions given. Final Clinical Impressions(s) / UC Diagnoses   Final diagnoses:  Cutaneous abscess of buttock     Discharge Instructions      Please continue sitz bath's Please remove packing if it is still in place 48 hours from today Take medications as prescribed If you have worsening pain, swelling, discharge or fever please return to the urgent care to be reevaluated.   ED Prescriptions     Medication Sig Dispense Auth. Provider   metroNIDAZOLE (FLAGYL) 500 MG tablet Take 1 tablet (500 mg total) by mouth 3 (three) times daily for 7 days. 21 tablet Celia Friedland, Myrene Galas, MD   traMADol (ULTRAM) 50 MG tablet Take 1 tablet (50 mg total) by mouth every 12 (twelve) hours as needed. 10 tablet Karsyn Jamie, Myrene Galas, MD      I have reviewed the PDMP during this encounter.   Chase Picket, MD 05/14/21 [phone]

## 2021-05-19 ENCOUNTER — Encounter: Payer: Self-pay | Admitting: Physician Assistant

## 2021-05-19 DIAGNOSIS — F319 Bipolar disorder, unspecified: Secondary | ICD-10-CM | POA: Diagnosis not present

## 2021-05-27 DIAGNOSIS — F319 Bipolar disorder, unspecified: Secondary | ICD-10-CM | POA: Diagnosis not present

## 2021-06-02 DIAGNOSIS — F319 Bipolar disorder, unspecified: Secondary | ICD-10-CM | POA: Diagnosis not present

## 2021-06-10 ENCOUNTER — Encounter: Payer: BC Managed Care – PPO | Admitting: Physician Assistant

## 2021-06-17 ENCOUNTER — Other Ambulatory Visit: Payer: Self-pay | Admitting: Family Medicine

## 2021-06-17 ENCOUNTER — Encounter: Payer: Self-pay | Admitting: Family Medicine

## 2021-06-17 ENCOUNTER — Ambulatory Visit: Payer: Managed Care, Other (non HMO) | Admitting: Family Medicine

## 2021-06-17 VITALS — BP 140/88 | HR 65 | Temp 97.6°F | Ht 66.0 in | Wt 194.0 lb

## 2021-06-17 DIAGNOSIS — E119 Type 2 diabetes mellitus without complications: Secondary | ICD-10-CM

## 2021-06-17 DIAGNOSIS — R109 Unspecified abdominal pain: Secondary | ICD-10-CM

## 2021-06-17 DIAGNOSIS — E782 Mixed hyperlipidemia: Secondary | ICD-10-CM

## 2021-06-17 DIAGNOSIS — N941 Unspecified dyspareunia: Secondary | ICD-10-CM | POA: Diagnosis not present

## 2021-06-17 DIAGNOSIS — E559 Vitamin D deficiency, unspecified: Secondary | ICD-10-CM | POA: Diagnosis not present

## 2021-06-17 DIAGNOSIS — I10 Essential (primary) hypertension: Secondary | ICD-10-CM | POA: Diagnosis not present

## 2021-06-17 DIAGNOSIS — N951 Menopausal and female climacteric states: Secondary | ICD-10-CM

## 2021-06-17 DIAGNOSIS — F319 Bipolar disorder, unspecified: Secondary | ICD-10-CM

## 2021-06-17 LAB — CBC WITH DIFFERENTIAL/PLATELET
Basophils Absolute: 0 10*3/uL (ref 0.0–0.1)
Basophils Relative: 0.6 % (ref 0.0–3.0)
Eosinophils Absolute: 0.1 10*3/uL (ref 0.0–0.7)
Eosinophils Relative: 2.3 % (ref 0.0–5.0)
HCT: 35.7 % — ABNORMAL LOW (ref 36.0–46.0)
Hemoglobin: 12.2 g/dL (ref 12.0–15.0)
Lymphocytes Relative: 48.5 % — ABNORMAL HIGH (ref 12.0–46.0)
Lymphs Abs: 3.1 10*3/uL (ref 0.7–4.0)
MCHC: 34 g/dL (ref 30.0–36.0)
MCV: 88.9 fl (ref 78.0–100.0)
Monocytes Absolute: 0.3 10*3/uL (ref 0.1–1.0)
Monocytes Relative: 5.2 % (ref 3.0–12.0)
Neutro Abs: 2.8 10*3/uL (ref 1.4–7.7)
Neutrophils Relative %: 43.4 % (ref 43.0–77.0)
Platelets: 266 10*3/uL (ref 150.0–400.0)
RBC: 4.02 Mil/uL (ref 3.87–5.11)
RDW: 15.3 % (ref 11.5–15.5)
WBC: 6.4 10*3/uL (ref 4.0–10.5)

## 2021-06-17 LAB — COMPREHENSIVE METABOLIC PANEL
ALT: 18 U/L (ref 0–35)
AST: 17 U/L (ref 0–37)
Albumin: 3.9 g/dL (ref 3.5–5.2)
Alkaline Phosphatase: 45 U/L (ref 39–117)
BUN: 11 mg/dL (ref 6–23)
CO2: 28 mEq/L (ref 19–32)
Calcium: 8.8 mg/dL (ref 8.4–10.5)
Chloride: 104 mEq/L (ref 96–112)
Creatinine, Ser: 0.78 mg/dL (ref 0.40–1.20)
GFR: 86.61 mL/min (ref >60.00)
Glucose, Bld: 104 mg/dL — ABNORMAL HIGH (ref 70–99)
Potassium: 4.2 mEq/L (ref 3.5–5.1)
Sodium: 137 mEq/L (ref 135–145)
Total Bilirubin: 0.4 mg/dL (ref 0.2–1.2)
Total Protein: 7.4 g/dL (ref 6.0–8.3)

## 2021-06-17 LAB — TSH: TSH: 1.28 u[IU]/mL (ref 0.35–5.50)

## 2021-06-17 LAB — T4, FREE: Free T4: 0.78 ng/dL (ref 0.60–1.60)

## 2021-06-17 LAB — LIPID PANEL
Cholesterol: 187 mg/dL (ref 0–200)
HDL: 46.6 mg/dL (ref >39.00)
LDL Cholesterol: 108 mg/dL — ABNORMAL HIGH (ref 0–99)
NonHDL: 140.06
Total CHOL/HDL Ratio: 4
Triglycerides: 160 mg/dL — ABNORMAL HIGH (ref 0.0–149.0)
VLDL: 32 mg/dL (ref 0.0–40.0)

## 2021-06-17 LAB — VITAMIN D 25 HYDROXY (VIT D DEFICIENCY, FRACTURES): VITD: 16.18 ng/mL — ABNORMAL LOW (ref 30.00–100.00)

## 2021-06-17 LAB — HEMOGLOBIN A1C: Hgb A1c MFr Bld: 6.4 % (ref 4.6–6.5)

## 2021-06-17 MED ORDER — VITAMIN D (ERGOCALCIFEROL) 1.25 MG (50000 UNIT) PO CAPS: 50000.0000 [IU] | ORAL_CAPSULE | ORAL | 0 refills | Status: DC

## 2021-06-17 NOTE — Patient Instructions (Signed)
Obgyn Offices:   [facility] [address] [place], [place] [address] [phone]  [facility] Address: [address] [place], [place] [address] Phone: [phone]  [facility] [address] , [place] [address] Phone: [phone]   [facility] [address] ,  [address] Phone: [phone] 

## 2021-06-17 NOTE — Progress Notes (Signed)
? ?  Subjective:  ? ? Patient ID: Deborah Haney, female    DOB: 1965/12/15, 56 y.o.   MRN: [id number] ? ?HPI ?Chief Complaint  ?Patient presents with  ? Establish Care  ?  Time for annual physical, would like labs done (check cholesterol). Thinks she may have arthritis, has been experiencing joint pain, thought it was cholestrol medication which is why she stopped but pain persisted. Would like therapist referral and OBGYN referral, painful sex/masturbation.   ? ?She is here to establish care. She was under my care at [facility] last year.  ? ?Other providers: ?[facility] ? ?DM- last Hgb A1c 6.5% 12/17/2020 ? ?HTN-  lisinopril-HCTZ 10-12.5 mg daily. She did not take it today.  ?Normally her BP is controlled when she is taking her medication regularly. ? ?Stopped statin to see if joint pain resolved but it did not.  Agreeable to start back on statin if needed pending lab results. ? ?States she is having vaginal dryness and painful intercourse.  Does not have an OB/GYN. ? ?Under the care of GI for abdominal pain and recurrent rectal bleeding. ? ?She is seeing her therapist for anxiety, depression, and bipolar ? ?She is working several jobs ? ?Denies fever, chills, dizziness, chest pain, palpitations, shortness of breath, N/V/D, urinary symptoms, LE edema.  ? ?Reviewed allergies, medications, past medical, surgical, family, and social history. ? ? ? ?Review of Systems ?Pertinent positives and negatives in the history of present illness. ? ?   ?Objective:  ? Physical Exam ?BP 140/88 (BP Location: Left Arm, Patient Position: Sitting, Cuff Size: Large)   Pulse 65   Temp 97.6 ?F (36.4 ?C) (Temporal)   Ht '5\' 6"'$  (1.676 m)   Wt 194 lb (88 kg)   LMP 04/25/2011   SpO2 99%   BMI 31.31 kg/m?  ? ?Alert and oriented in no acute distress.  Respirations unlabored.  Not otherwise examined. ? ? ?   ?Assessment & Plan:  ?Essential hypertension - Plan: CBC with Differential/Platelet, Comprehensive metabolic panel, Comprehensive metabolic  panel, CBC with Differential/Platelet ?-Recommend good compliance with medication.  Follow-up pending lab results ? ?Mixed dyslipidemia - Plan: Lipid panel, Lipid panel ?-She is currently not on a statin.  Check lipid panel and follow-up ? ?Controlled type 2 diabetes mellitus without complication, without long-term current use of insulin (H[facility]) - Plan: TSH, T4, free, Hemoglobin A1c, Hemoglobin A1c, T4, free, TSH ?-Recommend low sugar, low-carb diet and increasing physical activity.  Follow-up pending A1c and other lab results. ? ?Dyspareunia in female ?-Referral to gynecologist ? ?Menopausal symptoms ?-Referral to gynecologist ? ?Bipolar affective disorder, remission status unspecified ([place]) ?-Continue seeing therapist.  Does not want medication at this time. ? ?Vitamin D deficiency - Plan: VITAMIN D 25 Hydroxy (Vit-D Deficiency, Fractures), VITAMIN D 25 Hydroxy (Vit-D Deficiency, Fractures) ?-Follow-up pending vitamin D level and supplement as appropriate ? ?Intermittent abdominal pain - Plan: Comprehensive metabolic panel, Comprehensive metabolic panel ?-Under the care of GI ? ? ?

## 2021-07-07 ENCOUNTER — Ambulatory Visit: Payer: Managed Care, Other (non HMO) | Admitting: Gastroenterology

## 2021-07-07 ENCOUNTER — Encounter: Payer: Self-pay | Admitting: Gastroenterology

## 2021-07-07 ENCOUNTER — Other Ambulatory Visit: Payer: Managed Care, Other (non HMO)

## 2021-07-07 VITALS — BP 132/86 | HR 55 | Ht 66.0 in | Wt 199.2 lb

## 2021-07-07 DIAGNOSIS — R194 Change in bowel habit: Secondary | ICD-10-CM

## 2021-07-07 DIAGNOSIS — K589 Irritable bowel syndrome without diarrhea: Secondary | ICD-10-CM | POA: Insufficient documentation

## 2021-07-07 DIAGNOSIS — R140 Abdominal distension (gaseous): Secondary | ICD-10-CM | POA: Insufficient documentation

## 2021-07-07 DIAGNOSIS — K625 Hemorrhage of anus and rectum: Secondary | ICD-10-CM

## 2021-07-07 MED ORDER — HYOSCYAMINE SULFATE 0.125 MG SL SUBL: SUBLINGUAL_TABLET | SUBLINGUAL | 0 refills | Status: DC

## 2021-07-07 MED ORDER — CHOLESTYRAMINE 4 G PO PACK: 4.0000 g | PACK | Freq: Every day | ORAL | 3 refills | Status: DC

## 2021-07-07 NOTE — Patient Instructions (Addendum)
If you are age 54 or older, your body mass index should be between 23-30. Your Body mass index is 32.15 kg/m?Marland Kitchen If this is out of the aforementioned range listed, please consider follow up with your Primary Care Provider. ? ?If you are age 3 or younger, your body mass index should be between 19-25. Your Body mass index is 32.15 kg/m?Marland Kitchen If this is out of the aformentioned range listed, please consider follow up with your Primary Care Provider.  ? ?________________________________________________________ ? ?The [facility] providers would like to encourage you to use [facility] to communicate with providers for non-urgent requests or questions.  Due to long hold times on the telephone, sending your provider a message by [facility] may be a faster and more efficient way to get a response.  Please allow 48 business hours for a response.  Please remember that this is for non-urgent requests.  ?_______________________________________________________ ? ?Your provider has requested that you go to the basement level for lab work before leaving today. Press "B" on the elevator. The lab is located at the first door on the left as you exit the elevator. ? ?Due to recent changes in healthcare laws, you may see the results of your imaging and laboratory studies on MyChart before your provider has had a chance to review them.  We understand that in some cases there may be results that are confusing or concerning to you. Not all laboratory results come back in the same time frame and the provider may be waiting for multiple results in order to interpret others.  Please give [place] 48 hours in order for your provider to thoroughly review all the results before contacting the office for clarification of your results.   ? ? ?It was a pleasure to see you today! ? ?Thank you for trusting me with your gastrointestinal care!   ? ? ?

## 2021-07-07 NOTE — Progress Notes (Signed)
? ? ? ?07/07/2021 ?Deborah Haney ?[id number] ?[birth year] ? ? ?HISTORY OF PRESENT ILLNESS:  This is a 56 year old female who is a patient of Dr. Ardis Hughs.  She had 2 hemorrhoid bandings in 2019 by Dr. Hilarie Fredrickson.  She then underwent colonoscopy by Dr. Ardis Hughs in August 2020 at which time she had 2 polyps that were removed, one was a hyperplastic polyp and one was a tubular adenoma--recall 2027.  Also noted to have medium sized internal and external hemorrhoids.  Was recommended that she follow-up for further hemorrhoid banding.  She then had a hemorrhoid banding with Dr. Carlean Purl at the time of her last OV with me on 08/30/2019.  Then she came back for another banding on 11/07/2019 with Dr. Carlean Purl.  Has not been seen since that time.  She presents here today with again recurrent intermittent rectal bleeding.  States sometimes it is a little bit, sometimes more.  She says that her stools fluctuate a lot in color and consistency, but they are always more mushy or loose.  Says that every time that she eats within 20 minutes she has to have a bowel movement.  She says that she frequently has generalized abdominal discomfort that she describes more as a nagging pain, not severe.  She thinks it has to do with being stressed and anxious and she did mention that before when I saw her.  Complains of a lot of gas and bloating and asking if it could be from something that she is eating that is causing it. ? ?Past Medical History:  ?Diagnosis Date  ? Acute cholecystitis 07/14/2013  ? Acute on chronic respiratory failure with hypoxia ([place]) 09/05/2018  ? Anemia   ? has history, takes iron  ? Anxiety   ? Bipolar affective disorder ([place]) 10/26/2015  ? Has seen counselor, declined medications per medical record from [facility]. hypermanic  ? Chest pain 07/14/2013  ? Chronic vaginitis 10/26/2015  ? Has h/o vag dryness and tried Estrace per [facility] records  ? COVID-19 virus infection 08/24/2018  ? Depression   ? Diabetes ([place])   ? Patient denies  ?  Diverticulitis   ? no problems  ? Dysfunctional uterine bleeding 03/02/2011  ? Elevated hemoglobin A1c 10/26/2015  ? GERD (gastroesophageal reflux disease)   ? tums prn  ? Headache(784.0)   ? Hemorrhoids   ? HTN (hypertension)   ? BP meds since fall 2016  ? Internal hemorrhoids   ? Nausea & vomiting 07/14/2013  ? Obesity 10/26/2015  ? Perimenopausal symptom 04/30/2021  ? Rectal bleeding 03/15/2017  ? ?Past Surgical History:  ?Procedure Laterality Date  ? CHOLECYSTECTOMY N/A 07/15/2013  ? Procedure: LAPAROSCOPIC CHOLECYSTECTOMY;  Surgeon: Rolm Bookbinder, MD;  Location: [facility];  Service: General;  Laterality: N/A;  ? COLONOSCOPY    ? ECTOPIC PREGNANCY SURGERY    ? laparotomy  ? HEMORRHOID BANDING    ? ROBOTIC ASSISTED LAP VAGINAL HYSTERECTOMY    ? ? reports that she quit smoking about 13 years ago. Her smoking use included cigarettes. She has never used smokeless tobacco. She reports current alcohol use. She reports that she does not use drugs. ?family history includes Arthritis (age of onset: 57) in her father; CVA in an other family member; Cancer in her brother and brother; Diabetes in her mother; Hypertension in her brother and mother. ?No Known Allergies ? ?  ?Outpatient Encounter Medications as of 07/07/2021  ?Medication Sig  ? Accu-Chek Softclix Lancets lancets Use as instructed;  Check FSBS BID - Include  strips # 50 with 5 refills, Lancets #50 with 5 refills DX: Type 2 DM - ICD 10: E11.9  ? Blood Glucose Monitoring Suppl (CONTOUR MONITOR) w/Device KIT 1 kit by Does not apply route 2 (two) times a day.  ? CONTOUR NEXT TEST test strip USE 1 STRIP TO CHECK GLUCOSE TWICE DAILY  ? lisinopril-hydrochlorothiazide (ZESTORETIC) 10-12.5 MG tablet Take 1 tablet by mouth daily.  ? mupirocin cream (BACTROBAN) 2 % Apply 1 application. topically 2 (two) times daily.  ? neomycin-colistin-hydrocortisone-thonzonium (COLY-MYCIN S) 3.05-07-08-0.5 MG/ML OTIC suspension Place 2 drops into the left ear 3 (three) times daily.  ?  NEOMYCIN-POLYMYXIN-HYDROCORTISONE (CORTISPORIN) 1 % SOLN OTIC solution neomycin-polymyxin-hydrocort 3.5 mg/mL-10,000 unit/mL-1 % ear solution ? INSTILL 2 DROPS INTO THE LEFT EAR THREE TIMES DAILY  ? Probiotic Product (PROBIOTIC-10 PO) Take 1 mg by mouth 1 day or 1 dose. Pt. Takes every other day  ? Vitamin D, Ergocalciferol, (DRISDOL) 1.25 MG (50000 UNIT) CAPS capsule Take 1 capsule (50,000 Units total) by mouth every 7 (seven) days.  ? Zinc 100 MG TABS Take by mouth.  ? [DISCONTINUED] traMADol (ULTRAM) 50 MG tablet Take 1 tablet (50 mg total) by mouth every 12 (twelve) hours as needed.  ? [DISCONTINUED] valACYclovir (VALTREX) 500 MG tablet Take 1 tablet (500 mg total) by mouth 2 (two) times daily. X 3 days for outbreak.  ? albuterol (VENTOLIN HFA) 108 (90 Base) MCG/ACT inhaler Inhale 2 puffs into the lungs every 6 (six) hours as needed for wheezing or shortness of breath.  ? [DISCONTINUED] mupirocin ointment (BACTROBAN) 2 % mupirocin 2 % topical ointment ? APPLY OINTMENT EXTERNALLY TO AFFECTED AREA TWICE DAILY  ? ?No facility-administered encounter medications on file as of 07/07/2021.  ? ? ? ?REVIEW OF SYSTEMS  : All other systems reviewed and negative except where noted in the History of Present Illness. ? ? ?PHYSICAL EXAM: ?BP 132/86   Pulse (!) 55   Ht '5\' 6"'  (1.676 m)   Wt 199 lb 3.2 oz (90.4 kg)   LMP 04/25/2011   SpO2 97%   BMI 32.15 kg/m?  ?General: Well developed female in no acute distress ?Head: Normocephalic and atraumatic ?Eyes:  Sclerae anicteric, conjunctiva pink. ?Ears: Normal auditory acuity ?Lungs: Clear throughout to auscultation; no W/R/R. ?Heart: Regular rate and rhythm; no M/R/G. ?Abdomen: Soft, non-distended.  BS present.  Non-tender. ?Rectal:  Patient declined. ?Musculoskeletal: Symmetrical with no gross deformities  ?Skin: No lesions on visible extremities ?Extremities: No edema  ?Neurological: Alert oriented x 4, grossly non-focal ?Psychological:  Alert and cooperative. Normal mood and  affect ? ?ASSESSMENT AND PLAN: ?*Soft/loose/unformed stools: Often has a bowel movement within 20 minutes after eating.  She is status post cholecystectomy.  We will try Questran 1 packet mixed with 8 ounces of liquid daily to try to bind her stools.  Will check celiac labs. ?*Gas/bloating and generalized abdominal pain: We again discussed possibility of IBS as she feels like her abdominal pain gets worse when she is anxious or stressed.  We discussed this previously as well.  We also discussed the role that diet may be playing.  We discussed and she was given literature on lactose-free diet as well as FODMAP diets.  We will try Levsin as needed.  Prescription sent to pharmacy ?*Rectal bleeding: No significant bleeding in the past few weeks.  She declined, somewhat hesitant for rectal exam today.  Last colonoscopy was less than 3 years ago.  Suspect recurrent hemorrhoid issues as a source of this.  Will  monitor for now. ? ?-We will follow-up with me in 6 to 8 weeks. ? ? ?CC:  Henson, Vickie L, NP-C ? ?  ?

## 2021-07-08 LAB — IGA: Immunoglobulin A: 180 mg/dL (ref 47–310)

## 2021-07-08 LAB — TISSUE TRANSGLUTAMINASE, IGA: (tTG) Ab, IgA: <1 U/mL

## 2021-07-08 NOTE — Progress Notes (Signed)
I agree with the agbove note, plan 

## 2021-08-25 ENCOUNTER — Encounter: Payer: Self-pay | Admitting: Gastroenterology

## 2021-08-25 ENCOUNTER — Ambulatory Visit: Payer: Managed Care, Other (non HMO) | Admitting: Gastroenterology

## 2021-08-25 VITALS — BP 144/76 | HR 84 | Ht 66.0 in | Wt 194.0 lb

## 2021-08-25 DIAGNOSIS — K589 Irritable bowel syndrome without diarrhea: Secondary | ICD-10-CM

## 2021-08-25 DIAGNOSIS — R195 Other fecal abnormalities: Secondary | ICD-10-CM

## 2021-08-25 DIAGNOSIS — R140 Abdominal distension (gaseous): Secondary | ICD-10-CM

## 2021-08-25 DIAGNOSIS — R194 Change in bowel habit: Secondary | ICD-10-CM | POA: Diagnosis not present

## 2021-08-25 NOTE — Progress Notes (Signed)
   08/25/2021 Deborah Haney [id number] 06/09/1965   HISTORY OF PRESENT ILLNESS: This is a pleasant 56-year-old female who is a patient of Dr. Jacobs.  She had 2 hemorrhoid bandings in 2019 with Dr. Pyrtle.  She then underwent colonoscopy by Dr. Jacobs in August 2020 at which time she had 2 polyps that were removed, 1 was a hyperplastic polyp and 1 was a tubular adenoma with colonoscopy recall for 2027.  She was also noted to have medium size internal and external hemorrhoids.  Was recommended that she have follow-up for further banding.  She then had a hemorrhoid banding by Dr. Gessner at the time of her office visit with me on 08/30/2019.  Then she came back for another banding on 11/07/2019 with Dr. Gessner.  She was then seen by me on 07/07/2021 for complaints of generalized abdominal pain with loose stools after eating as well as gas and bloating.  She described that her stools were always loose or mushy, often occurring within 20 minutes after every time that she eats.  Described a nagging generalized abdominal discomfort that she thought would worsen with stress and anxiety.  Last time we discussed lactose-free diet.  We checked celiac labs which were negative, we added Questran 1 packet daily as she is status post cholecystectomy.  We also gave her Levsin to use as needed.  She admits that she does use the Levsin as needed and it definitely helps.  She has not been quite consistent with the Questran on a daily basis.  She is asking if she can add Metamucil with that as well as she also wants the added benefits for cholesterol control with that.  She is still taking in some dairy in the form of butter.  She describes that her stools are greasy/oily appearing.  She has had only 1 small episode of rectal bleeding since her last visit here.   Past Medical History:  Diagnosis Date   Acute cholecystitis 07/14/2013   Acute on chronic respiratory failure with hypoxia (HCC) 09/05/2018   Anemia    has history,  takes iron   Anxiety    Bipolar affective disorder (HCC) 10/26/2015   Has seen counselor, declined medications per medical record from [facility]. hypermanic   Chest pain 07/14/2013   Chronic vaginitis 10/26/2015   Has h/o vag dryness and tried Estrace per [facility] records   COVID-19 virus infection 08/24/2018   Depression    Diabetes (HCC)    Patient denies   Diverticulitis    no problems   Dysfunctional uterine bleeding 03/02/2011   Elevated hemoglobin A1c 10/26/2015   GERD (gastroesophageal reflux disease)    tums prn   Headache(784.0)    Hemorrhoids    HTN (hypertension)    BP meds since fall 2016   Internal hemorrhoids    Nausea & vomiting 07/14/2013   Obesity 10/26/2015   Perimenopausal symptom 04/30/2021   Rectal bleeding 03/15/2017   Past Surgical History:  Procedure Laterality Date   CHOLECYSTECTOMY N/A 07/15/2013   Procedure: LAPAROSCOPIC CHOLECYSTECTOMY;  Surgeon: Matthew Wakefield, MD;  Location: [facility];  Service: General;  Laterality: N/A;   COLONOSCOPY     ECTOPIC PREGNANCY SURGERY     laparotomy   HEMORRHOID BANDING     ROBOTIC ASSISTED LAP VAGINAL HYSTERECTOMY      reports that she quit smoking about 13 years ago. Her smoking use included cigarettes. She has never used smokeless tobacco. She reports current alcohol use. She reports that she does not   use drugs. family history includes Arthritis (age of onset: 78) in her father; CVA in an other family member; Cancer in her brother and brother; Diabetes in her mother; Hypertension in her brother and mother. No Known Allergies    Outpatient Encounter Medications as of 08/25/2021  Medication Sig   Accu-Chek Softclix Lancets lancets Use as instructed;  Check FSBS BID - Include strips # 50 with 5 refills, Lancets #50 with 5 refills DX: Type 2 DM - ICD 10: E11.9   Blood Glucose Monitoring Suppl (CONTOUR MONITOR) w/Device KIT 1 kit by Does not apply route 2 (two) times a day.   cholestyramine (QUESTRAN) 4 g packet Take 1 packet (4 g  total) by mouth daily.   CONTOUR NEXT TEST test strip USE 1 STRIP TO CHECK GLUCOSE TWICE DAILY   hyoscyamine (LEVSIN SL) 0.125 MG SL tablet Take 1 tablet, Every 6-8 hours as needed   lisinopril-hydrochlorothiazide (ZESTORETIC) 10-12.5 MG tablet Take 1 tablet by mouth daily.   mupirocin cream (BACTROBAN) 2 % Apply 1 application. topically 2 (two) times daily.   neomycin-colistin-hydrocortisone-thonzonium (COLY-MYCIN S) 3.05-07-08-0.5 MG/ML OTIC suspension Place 2 drops into the left ear 3 (three) times daily.   NEOMYCIN-POLYMYXIN-HYDROCORTISONE (CORTISPORIN) 1 % SOLN OTIC solution neomycin-polymyxin-hydrocort 3.5 mg/mL-10,000 unit/mL-1 % ear solution  INSTILL 2 DROPS INTO THE LEFT EAR THREE TIMES DAILY   Probiotic Product (PROBIOTIC-10 PO) Take 1 mg by mouth 1 day or 1 dose. Pt. Takes every other day   Vitamin D, Ergocalciferol, (DRISDOL) 1.25 MG (50000 UNIT) CAPS capsule Take 1 capsule (50,000 Units total) by mouth every 7 (seven) days.   [DISCONTINUED] Zinc 100 MG TABS Take by mouth.   [DISCONTINUED] albuterol (VENTOLIN HFA) 108 (90 Base) MCG/ACT inhaler Inhale 2 puffs into the lungs every 6 (six) hours as needed for wheezing or shortness of breath.   No facility-administered encounter medications on file as of 08/25/2021.     REVIEW OF SYSTEMS  : All other systems reviewed and negative except where noted in the History of Present Illness.   PHYSICAL EXAM: BP (!) 144/76   Pulse 84   Ht 5' 6" (1.676 m)   Wt 194 lb (88 kg)   LMP 04/25/2011   BMI 31.31 kg/m  General: Well developed female in no acute distress Head: Normocephalic and atraumatic Eyes:  Sclerae anicteric, conjunctiva pink. Ears: Normal auditory acuity Lungs: Clear throughout to auscultation; no W/R/R. Heart: Regular rate and rhythm; no M/R/G. Abdomen: Soft, non-distended.  BS present.  Non-tender. Musculoskeletal: Symmetrical with no gross deformities  Skin: No lesions on visible extremities Extremities: No edema   Neurological: Alert oriented x 4, grossly non-focal Psychological:  Alert and cooperative. Normal mood and affect  ASSESSMENT AND PLAN: *Soft/loose/unformed stools with generalized abdominal pain and gas/bloating: Often has a bowel movement within 20 minutes after eating.  She is status post cholecystectomy, which is likely playing into this with possibly component of irritable bowel syndrome and may be lactose intolerance.  Celiac labs are negative.  She does describe some greasy looking stools.  We will check a pancreatic fecal elastase.  Otherwise she will continue the Questran and do the Levsin as needed, which does help.  She is going to add Metamucil daily as well for also the added benefits of helping with her cholesterol.  We will follow-up the results of her stool study and then make office visit follow-up pending those results.   CC:  Henson, Vickie L, NP-C    

## 2021-08-25 NOTE — Progress Notes (Signed)
I agree with the above note, plan 

## 2021-08-25 NOTE — Patient Instructions (Signed)
Your provider has requested that you go to the basement level for lab work before leaving today. Press "B" on the elevator. The lab is located at the first door on the left as you exit the elevator.  Continue lactose free diet.   Start taking over the counter Metamucil daily.   We will determine follow up depending results of your stool studies.   The [facility] providers would like to encourage you to use [facility] to communicate with providers for non-urgent requests or questions.  Due to long hold times on the telephone, sending your provider a message by [facility] may be a faster and more efficient way to get a response.  Please allow 48 business hours for a response.  Please remember that this is for non-urgent requests.   Due to recent changes in healthcare laws, you may see the results of your imaging and laboratory studies on MyChart before your provider has had a chance to review them.  We understand that in some cases there may be results that are confusing or concerning to you. Not all laboratory results come back in the same time frame and the provider may be waiting for multiple results in order to interpret others.  Please give [place] 48 hours in order for your provider to thoroughly review all the results before contacting the office for clarification of your results.

## 2021-08-31 ENCOUNTER — Other Ambulatory Visit: Payer: Managed Care, Other (non HMO)

## 2021-08-31 DIAGNOSIS — R195 Other fecal abnormalities: Secondary | ICD-10-CM

## 2021-08-31 DIAGNOSIS — R194 Change in bowel habit: Secondary | ICD-10-CM

## 2021-08-31 DIAGNOSIS — R140 Abdominal distension (gaseous): Secondary | ICD-10-CM

## 2021-09-08 LAB — PANCREATIC ELASTASE, FECAL: Pancreatic Elastase-1, Stool: >500 mcg/g

## 2021-09-15 ENCOUNTER — Encounter: Payer: Self-pay | Admitting: Gastroenterology

## 2021-09-15 ENCOUNTER — Other Ambulatory Visit (INDEPENDENT_AMBULATORY_CARE_PROVIDER_SITE_OTHER): Payer: Managed Care, Other (non HMO)

## 2021-09-15 ENCOUNTER — Ambulatory Visit: Payer: Managed Care, Other (non HMO) | Admitting: Gastroenterology

## 2021-09-15 VITALS — BP 128/84 | HR 76 | Ht 66.0 in | Wt 188.0 lb

## 2021-09-15 DIAGNOSIS — E1165 Type 2 diabetes mellitus with hyperglycemia: Secondary | ICD-10-CM

## 2021-09-15 DIAGNOSIS — R194 Change in bowel habit: Secondary | ICD-10-CM

## 2021-09-15 DIAGNOSIS — I10 Essential (primary) hypertension: Secondary | ICD-10-CM

## 2021-09-15 DIAGNOSIS — R109 Unspecified abdominal pain: Secondary | ICD-10-CM | POA: Diagnosis not present

## 2021-09-15 DIAGNOSIS — R634 Abnormal weight loss: Secondary | ICD-10-CM | POA: Diagnosis not present

## 2021-09-15 LAB — BASIC METABOLIC PANEL
BUN: 14 mg/dL (ref 6–23)
CO2: 26 mEq/L (ref 19–32)
Calcium: 9.9 mg/dL (ref 8.4–10.5)
Chloride: 102 mEq/L (ref 96–112)
Creatinine, Ser: 0.91 mg/dL (ref 0.40–1.20)
GFR: 71.85 mL/min (ref >60.00)
Glucose, Bld: 110 mg/dL — ABNORMAL HIGH (ref 70–99)
Potassium: 3.9 mEq/L (ref 3.5–5.1)
Sodium: 137 mEq/L (ref 135–145)

## 2021-09-15 NOTE — Patient Instructions (Signed)
If you are age 54 or younger, your body mass index should be between 19-25. Your Body mass index is 30.34 kg/m. If this is out of the aformentioned range listed, please consider follow up with your Primary Care Provider.  ________________________________________________________  The [facility] providers would like to encourage you to use [facility] to communicate with providers for non-urgent requests or questions.  Due to long hold times on the telephone, sending your provider a message by [facility] may be a faster and more efficient way to get a response.  Please allow 48 business hours for a response.  Please remember that this is for non-urgent requests.  _______________________________________________________  Your provider has requested that you go to the basement level for lab work before leaving today. Press "B" on the elevator. The lab is located at the first door on the left as you exit the elevator.  You have been scheduled for a CT scan of the abdomen and pelvis at [facility], 1st floor Radiology. You are scheduled on 09-23-21  at 8:30am. You should arrive 30 minutes prior to your appointment time for registration.   The solution may taste better if refrigerated, but do NOT add ice or any other liquid to this solution. Shake well before drinking.   Please follow the written instructions below on the day of your exam:   1) Do not eat anything after 4:30am (4 hours prior to your test)   2) Drink 1 bottle of contrast @ 6:30am (2 hours prior to your exam)  Remember to shake well before drinking and do NOT pour over ice.     Drink 1 bottle of contrast @ 7:30am (1 hour prior to your exam)   You may take any medications as prescribed with a small amount of water, if necessary. If you take any of the following medications: METFORMIN, GLUCOPHAGE, GLUCOVANCE, AVANDAMET, RIOMET, FORTAMET, [place] MET, JANUMET, GLUMETZA or METAGLIP, you MAY be asked to HOLD this medication 48 hours AFTER the  exam.   The purpose of you drinking the oral contrast is to aid in the visualization of your intestinal tract. The contrast solution may cause some diarrhea. Depending on your individual set of symptoms, you may also receive an intravenous injection of x-ray contrast/dye. Plan on being at [facility] for 45 minutes or longer, depending on the type of exam you are having performed.   If you have any questions regarding your exam or if you need to reschedule, you may call [facility] at [phone] between the hours of 8:00 am and 5:00 pm, Monday-Friday.  Due to recent changes in healthcare laws, you may see the results of your imaging and laboratory studies on MyChart before your provider has had a chance to review them.  We understand that in some cases there may be results that are confusing or concerning to you. Not all laboratory results come back in the same time frame and the provider may be waiting for multiple results in order to interpret others.  Please give [place] 48 hours in order for your provider to thoroughly review all the results before contacting the office for clarification of your results.   Thank you for entrusting me with your care and choosing [facility].  Dr Ardis Hughs

## 2021-09-15 NOTE — Progress Notes (Signed)
Review of pertinent gastrointestinal problems: 1.  History of adenomatous polyps colonoscopy Dr. Ardis Hughs 10/2018 2 subCMpolyps that were removed, 1 was a hyperplastic polyp and 1 was a tubular adenoma. 2.  Internal and external hemorrhoids, underwent internal hemorrhoid banding 2021 twice 3.  Generalized abdominal discomforts, bloating, postprandial discomforts led to testing 2023: CBC, complete metabolic profile, TSH, celiac sprue testing, pancreatic elastase were all normal.  Symptoms seem to improve with Levsin on as needed basis.  HPI: This is a very pleasant 56 year old woman whom I am seeing for the first time since colonoscopy almost 3 years ago.   She was last here in our office in June.  Last month.  She has seen Janett Billow twice in the past couple months.  She was having some loose stools and generalized abdominal discomforts that she felt would worsen with stress and anxiety.  Celiac sprue serologies were negative.  Levsin used on an as-needed basis definitely helped.  Her weight is down 6 pounds since her last office visit here last month  She is still bothered by generalized abdominal discomforts, some bowel changes.  She will have loose stools 4-5 times a day, always after she eats.  Her stools are beginning to look oily.  She takes Questran every day or 2 and does not think that is making much of a difference, Levsin still seems to reliably help.  She can tell that she has been losing some weight.  Colon cancer does not run in her family, pancreatic cancer does not run in her family.   ROS: complete GI ROS as described in HPI, all other review negative.  Constitutional:  No unintentional weight loss   Past Medical History:  Diagnosis Date   Acute cholecystitis 07/14/2013   Acute on chronic respiratory failure with hypoxia ([place]) 09/05/2018   Anemia    has history, takes iron   Anxiety    Bipolar affective disorder ([place]) 10/26/2015   Has seen counselor, declined medications per  medical record from [facility]. hypermanic   Chest pain 07/14/2013   Chronic vaginitis 10/26/2015   Has h/o vag dryness and tried Estrace per [facility] records   COVID-19 virus infection 08/24/2018   Depression    Diabetes ([place])    Patient denies   Diverticulitis    no problems   Dysfunctional uterine bleeding 03/02/2011   Elevated hemoglobin A1c 10/26/2015   GERD (gastroesophageal reflux disease)    tums prn   Headache(784.0)    Hemorrhoids    HTN (hypertension)    BP meds since fall 2016   Internal hemorrhoids    Nausea & vomiting 07/14/2013   Obesity 10/26/2015   Perimenopausal symptom 04/30/2021   Rectal bleeding 03/15/2017    Past Surgical History:  Procedure Laterality Date   CHOLECYSTECTOMY N/A 07/15/2013   Procedure: LAPAROSCOPIC CHOLECYSTECTOMY;  Surgeon: Rolm Bookbinder, MD;  Location: [facility];  Service: General;  Laterality: N/A;   COLONOSCOPY     ECTOPIC PREGNANCY SURGERY     laparotomy   HEMORRHOID BANDING     ROBOTIC ASSISTED LAP VAGINAL HYSTERECTOMY      Current Outpatient Medications  Medication Instructions   Accu-Chek Softclix Lancets lancets Use as instructed;  Check FSBS BID - Include strips # 50 with 5 refills, Lancets #50 with 5 refills DX: Type 2 DM - ICD 10: E11.9   Blood Glucose Monitoring Suppl (CONTOUR MONITOR) w/Device KIT 1 kit, Does not apply, 2 times daily   cholestyramine (QUESTRAN) 4 g, Oral, Daily   CONTOUR NEXT TEST  test strip USE 1 STRIP TO CHECK GLUCOSE TWICE DAILY   hyoscyamine (LEVSIN SL) 0.125 MG SL tablet Take 1 tablet, Every 6-8 hours as needed   lisinopril-hydrochlorothiazide (ZESTORETIC) 10-12.5 MG tablet 1 tablet, Oral, Daily   mupirocin cream (BACTROBAN) 2 % 1 application , Topical, 2 times daily   neomycin-colistin-hydrocortisone-thonzonium (COLY-MYCIN S) 3.05-07-08-0.5 MG/ML OTIC suspension 2 drops, Left EAR, 3 times daily   NEOMYCIN-POLYMYXIN-HYDROCORTISONE (CORTISPORIN) 1 % SOLN OTIC solution neomycin-polymyxin-hydrocort 3.5 mg/mL-10,000  unit/mL-1 % ear solution  INSTILL 2 DROPS INTO THE LEFT EAR THREE TIMES DAILY   Probiotic Product (PROBIOTIC-10 PO) 1 mg, Oral, 1 Day/Dose, Pt. Takes every other day   Vitamin D (Ergocalciferol) (DRISDOL) 50,000 Units, Oral, Every 7 days    Allergies as of 09/15/2021   (No Known Allergies)    Family History  Problem Relation Age of Onset   Diabetes Mother    Hypertension Mother    Arthritis Father 47   Hypertension Brother    Cancer Brother        blood    Cancer Brother        unsure kind, as a child    CVA Other    Colon cancer Neg Hx    Colon polyps Neg Hx    Esophageal cancer Neg Hx    Rectal cancer Neg Hx    Stomach cancer Neg Hx    Pancreatic cancer Neg Hx     Social History   Socioeconomic History   Marital status: Divorced    Spouse name: Not on file   Number of children: 0   Years of education: Not on file   Highest education level: Not on file  Occupational History   Occupation: Education officer, museum, Chief of Staff  Tobacco Use   Smoking status: Former    Types: Cigarettes    Quit date: 04/18/2008    Years since quitting: 13.4   Smokeless tobacco: Never  Vaping Use   Vaping Use: Never used  Substance and Sexual Activity   Alcohol use: Yes    Comment: 1 glass of wine rarely, occasionally   Drug use: No    Types: Cocaine    Comment: Former cocaine use since 2012   Sexual activity: Yes    Partners: Male    Birth control/protection: Surgical  Other Topics Concern   Not on file  Social History Narrative   Lives alone.     No children, no pets.   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   Social Determinants of Health   Financial Resource Strain: Not on file  Food Insecurity: Not on file  Transportation Needs: Not on file  Physical Activity: Unknown (08/31/2018)   Exercise Vital Sign    Days of Exercise per Week: Patient refused    Minutes of Exercise per Session: Patient refused  Stress: Not on file  Social Connections:  Unknown (08/31/2018)   Social Connection and Isolation Panel [NHANES]    Frequency of Communication with Friends and Family: Patient refused    Frequency of Social Gatherings with Friends and Family: Patient refused    Attends Religious Services: Patient refused    Active Member of Clubs or Organizations: Patient refused    Attends Archivist Meetings: Patient refused    Marital Status: Patient refused  Intimate Partner Violence: Unknown (08/31/2018)   Humiliation, Afraid, Rape, and Kick questionnaire    Fear of Current or Ex-Partner: Patient refused    Emotionally Abused: Patient refused  Physically Abused: Patient refused    Sexually Abused: Patient refused     Physical Exam: BP 128/84   Pulse 76   Ht _0  (1.676 m)   Wt 188 lb (85.3 kg)   LMP 04/25/2011   SpO2 100%   BMI 30.34 kg/m  Constitutional: generally well-appearing Psychiatric: alert and oriented x3 Abdomen: soft, nontender, nondistended, no obvious ascites, no peritoneal signs, normal bowel sounds No peripheral edema noted in lower extremities  Assessment and plan: 56 y.o. female with generalized abdominal discomforts, bowel changes, oily looking stools, unintentional weight loss  She is still pretty miserable with bowel changes and generalized abdominal discomforts which started 2 or 3 months ago.  She is losing weight.  She has actually noticed that her stools have been a bit oily.  I recommended CT scan abdomen pelvis with IV and oral contrast as the next step in work-up.  She understands that if this is not helpful she might need further testing with colonoscopy and/or upper endoscopy.  Please see the "Patient Instructions" section for addition details about the plan.  Owens Loffler, MD [facility] 09/15/2021, 9:05 AM   Total time on date of encounter was 35 minutes (this included time spent preparing to see the patient reviewing records; obtaining and/or reviewing separately obtained  history; performing a medically appropriate exam and/or evaluation; counseling and educating the patient and family if present; ordering medications, tests or procedures if applicable; and documenting clinical information in the health record).

## 2021-09-22 ENCOUNTER — Ambulatory Visit (INDEPENDENT_AMBULATORY_CARE_PROVIDER_SITE_OTHER): Payer: Managed Care, Other (non HMO)

## 2021-09-22 ENCOUNTER — Ambulatory Visit (INDEPENDENT_AMBULATORY_CARE_PROVIDER_SITE_OTHER): Payer: Managed Care, Other (non HMO) | Admitting: Family Medicine

## 2021-09-22 ENCOUNTER — Encounter: Payer: Self-pay | Admitting: Family Medicine

## 2021-09-22 VITALS — BP 126/84 | HR 75 | Temp 97.6°F | Ht 66.0 in | Wt 187.0 lb

## 2021-09-22 DIAGNOSIS — R634 Abnormal weight loss: Secondary | ICD-10-CM

## 2021-09-22 DIAGNOSIS — R051 Acute cough: Secondary | ICD-10-CM

## 2021-09-22 DIAGNOSIS — J0140 Acute pansinusitis, unspecified: Secondary | ICD-10-CM

## 2021-09-22 DIAGNOSIS — J3489 Other specified disorders of nose and nasal sinuses: Secondary | ICD-10-CM | POA: Diagnosis not present

## 2021-09-22 DIAGNOSIS — R0602 Shortness of breath: Secondary | ICD-10-CM | POA: Diagnosis not present

## 2021-09-22 DIAGNOSIS — R062 Wheezing: Secondary | ICD-10-CM

## 2021-09-22 DIAGNOSIS — E1165 Type 2 diabetes mellitus with hyperglycemia: Secondary | ICD-10-CM | POA: Diagnosis not present

## 2021-09-22 DIAGNOSIS — R197 Diarrhea, unspecified: Secondary | ICD-10-CM | POA: Diagnosis not present

## 2021-09-22 LAB — COMPREHENSIVE METABOLIC PANEL
ALT: 16 U/L (ref 0–35)
AST: 16 U/L (ref 0–37)
Albumin: 4.5 g/dL (ref 3.5–5.2)
Alkaline Phosphatase: 57 U/L (ref 39–117)
BUN: 9 mg/dL (ref 6–23)
CO2: 28 mEq/L (ref 19–32)
Calcium: 9.4 mg/dL (ref 8.4–10.5)
Chloride: 102 mEq/L (ref 96–112)
Creatinine, Ser: 0.84 mg/dL (ref 0.40–1.20)
GFR: 79.09 mL/min (ref >60.00)
Glucose, Bld: 108 mg/dL — ABNORMAL HIGH (ref 70–99)
Potassium: 3.7 mEq/L (ref 3.5–5.1)
Sodium: 139 mEq/L (ref 135–145)
Total Bilirubin: 0.9 mg/dL (ref 0.2–1.2)
Total Protein: 8.4 g/dL — ABNORMAL HIGH (ref 6.0–8.3)

## 2021-09-22 LAB — CBC WITH DIFFERENTIAL/PLATELET
Basophils Absolute: 0 10*3/uL (ref 0.0–0.1)
Basophils Relative: 0.7 % (ref 0.0–3.0)
Eosinophils Absolute: 0.2 10*3/uL (ref 0.0–0.7)
Eosinophils Relative: 4.8 % (ref 0.0–5.0)
HCT: 39.1 % (ref 36.0–46.0)
Hemoglobin: 13.1 g/dL (ref 12.0–15.0)
Lymphocytes Relative: 40.2 % (ref 12.0–46.0)
Lymphs Abs: 2 10*3/uL (ref 0.7–4.0)
MCHC: 33.5 g/dL (ref 30.0–36.0)
MCV: 88.8 fl (ref 78.0–100.0)
Monocytes Absolute: 0.5 10*3/uL (ref 0.1–1.0)
Monocytes Relative: 9.6 % (ref 3.0–12.0)
Neutro Abs: 2.2 10*3/uL (ref 1.4–7.7)
Neutrophils Relative %: 44.7 % (ref 43.0–77.0)
Platelets: 235 10*3/uL (ref 150.0–400.0)
RBC: 4.4 Mil/uL (ref 3.87–5.11)
RDW: 15.5 % (ref 11.5–15.5)
WBC: 4.9 10*3/uL (ref 4.0–10.5)

## 2021-09-22 LAB — POC COVID19 BINAXNOW: SARS Coronavirus 2 Ag: NEGATIVE

## 2021-09-22 LAB — T4, FREE: Free T4: 0.83 ng/dL (ref 0.60–1.60)

## 2021-09-22 LAB — TSH: TSH: 1.14 u[IU]/mL (ref 0.35–5.50)

## 2021-09-22 LAB — HEMOGLOBIN A1C: Hgb A1c MFr Bld: 6.6 % — ABNORMAL HIGH (ref 4.6–6.5)

## 2021-09-22 MED ORDER — AZITHROMYCIN 250 MG PO TABS: ORAL_TABLET | ORAL | 0 refills | Status: AC

## 2021-09-22 NOTE — Progress Notes (Signed)
Subjective:  Deborah Haney is a 56 y.o. female who presents for 5 day history of sinus congestion, post nasal drainage, cough and chest congestion. Started having chills. Hx of pneumonia and states it feels like when she had pneumonia.  Short of breath and wheezing.  Used albuterol which she has from when she had pneumonia.  States it helped. States she has had unexplained weight loss approximately 10 pounds over the past month.  She is eating but probably not as much as usual due to poor appetite.  States she has diarrhea every time she eats.  She is seeing GI for this and has a CT of her abdomen pelvis tomorrow.  States she is up-to-date on her Pap smear.  She is supposed to have a mammogram soon.  Diabetes has been controlled.  Her last hemoglobin A1c was 6.4% in April.    Denies fever, body aches, fatigue, headache, chest pain, palpitations, abdominal pain, vomiting, urinary symptoms.  Treatment to date: Mucinex.  + sick contacts.  No other aggravating or relieving factors.     ROS as in subjective.   Objective: Vitals:   09/22/21 0838  BP: 126/84  Pulse: 75  Temp: 97.6 F (36.4 C)  SpO2: 99%    General appearance: Alert, WD/WN, no distress, mildly ill appearing                             Skin: warm, no rash                           Head: no sinus tenderness                            Eyes: conjunctiva normal, corneas clear, PERRLA                            Ears: pearly TMs, external ear canals normal                          Nose: septum midline, turbinates swollen, with erythema and clear discharge             Mouth/throat: mask on                            Neck: supple, no adenopathy, no thyromegaly, nontender                          Heart: RRR                         Lungs: CTA bilaterally, no wheezes, rales, or rhonchi      Assessment: Acute non-recurrent pansinusitis  Nasal drainage - Plan: POC COVID-19  Unexplained weight loss - Plan: CBC with  Differential/Platelet, Comprehensive metabolic panel, TSH, T4, free, DG Chest 2 View, T4, free, TSH, Comprehensive metabolic panel, CBC with Differential/Platelet  Diarrhea, unspecified type - Plan: CBC with Differential/Platelet, Comprehensive metabolic panel, Comprehensive metabolic panel, CBC with Differential/Platelet  Acute cough - Plan: DG Chest 2 View  Shortness of breath - Plan: CBC with Differential/Platelet, Comprehensive metabolic panel, DG Chest 2 View, Comprehensive metabolic panel, CBC with Differential/Platelet  Wheezing - Plan: DG Chest 2 View  Type  2 diabetes mellitus with hyperglycemia, without long-term current use of insulin ([place]) - Plan: CBC with Differential/Platelet, Comprehensive metabolic panel, TSH, T4, free, Hemoglobin A1c, Hemoglobin A1c, T4, free, TSH, Comprehensive metabolic panel, CBC with Differential/Platelet   Plan: Negative Covid test.  Chest x-ray and labs ordered for unexplained weight loss, diabetes and current illness.  Azithromycin prescribed.  She will use Flonase, Mucinex and her albuterol inhaler as needed.  She has an albuterol inhaler due to having pneumonia.  She is under the care of GI for unexplained weight loss as well.  Having a CT abdomen pelvis tomorrow.  Diabetes has been diet controlled with last A1c 6.4% in April.  Recheck this today.  Follow-up pending results and in approximately 10 days if she is not back to baseline with acute illness.

## 2021-09-22 NOTE — Patient Instructions (Signed)
Please go downstairs for labs and a chest x-ray before you leave today.  Start the antibiotic.  I also recommend using Flonase and taking Mucinex.  Drink plenty of water.  We will be in touch with your lab and x-ray results.  Follow-up if you are getting worse or not back to baseline in 10 days.  Make sure you get your mammogram

## 2021-09-23 ENCOUNTER — Ambulatory Visit (HOSPITAL_COMMUNITY)
Admission: RE | Admit: 2021-09-23 | Discharge: 2021-09-23 | Disposition: A | Discharge status: Home / Self Care | Payer: Managed Care, Other (non HMO) | Source: Ambulatory Visit | Attending: Gastroenterology | Admitting: Gastroenterology

## 2021-09-23 DIAGNOSIS — R109 Unspecified abdominal pain: Secondary | ICD-10-CM | POA: Diagnosis present

## 2021-09-23 DIAGNOSIS — R194 Change in bowel habit: Secondary | ICD-10-CM | POA: Diagnosis present

## 2021-09-23 DIAGNOSIS — R634 Abnormal weight loss: Secondary | ICD-10-CM | POA: Diagnosis present

## 2021-09-23 MED ORDER — IOHEXOL 300 MG/ML  SOLN
100.0000 mL | Freq: Once | INTRAMUSCULAR | Status: AC | PRN
  Administered 2021-09-23: 100 mL via INTRAVENOUS

## 2021-09-23 MED ORDER — SODIUM CHLORIDE (PF) 0.9 % IJ SOLN
INTRAMUSCULAR | Status: AC
  Filled 2021-09-23: qty 50

## 2021-09-29 DIAGNOSIS — R109 Unspecified abdominal pain: Secondary | ICD-10-CM

## 2021-09-29 DIAGNOSIS — R634 Abnormal weight loss: Secondary | ICD-10-CM

## 2021-09-29 MED ORDER — PEG 3350-KCL-NA BICARB-NACL 420 G PO SOLR: 4000.0000 mL | Freq: Once | ORAL | 0 refills | Status: AC

## 2021-11-04 ENCOUNTER — Encounter: Payer: Self-pay | Admitting: Family Medicine

## 2021-11-05 ENCOUNTER — Ambulatory Visit: Payer: Managed Care, Other (non HMO) | Admitting: Family

## 2021-11-05 ENCOUNTER — Ambulatory Visit: Payer: Managed Care, Other (non HMO) | Admitting: Physician Assistant

## 2021-11-09 ENCOUNTER — Ambulatory Visit: Payer: Managed Care, Other (non HMO) | Admitting: Family Medicine

## 2021-11-15 ENCOUNTER — Encounter (HOSPITAL_BASED_OUTPATIENT_CLINIC_OR_DEPARTMENT_OTHER): Payer: Self-pay | Admitting: *Deleted

## 2021-11-15 ENCOUNTER — Emergency Department (HOSPITAL_BASED_OUTPATIENT_CLINIC_OR_DEPARTMENT_OTHER)
Admission: EM | Admit: 2021-11-15 | Discharge: 2021-11-15 | Disposition: A | Discharge status: Home / Self Care | Payer: Managed Care, Other (non HMO) | Attending: Emergency Medicine | Admitting: Emergency Medicine

## 2021-11-15 ENCOUNTER — Emergency Department (HOSPITAL_BASED_OUTPATIENT_CLINIC_OR_DEPARTMENT_OTHER): Payer: Managed Care, Other (non HMO)

## 2021-11-15 ENCOUNTER — Other Ambulatory Visit: Payer: Self-pay

## 2021-11-15 DIAGNOSIS — Z8616 Personal history of COVID-19: Secondary | ICD-10-CM | POA: Diagnosis not present

## 2021-11-15 DIAGNOSIS — I10 Essential (primary) hypertension: Secondary | ICD-10-CM | POA: Diagnosis not present

## 2021-11-15 DIAGNOSIS — Z79899 Other long term (current) drug therapy: Secondary | ICD-10-CM | POA: Diagnosis not present

## 2021-11-15 DIAGNOSIS — R319 Hematuria, unspecified: Secondary | ICD-10-CM | POA: Diagnosis present

## 2021-11-15 DIAGNOSIS — N3001 Acute cystitis with hematuria: Secondary | ICD-10-CM | POA: Diagnosis not present

## 2021-11-15 LAB — BASIC METABOLIC PANEL
Anion gap: 9 (ref 5–15)
BUN: 15 mg/dL (ref 6–20)
CO2: 24 mmol/L (ref 22–32)
Calcium: 8.9 mg/dL (ref 8.9–10.3)
Chloride: 105 mmol/L (ref 98–111)
Creatinine, Ser: 0.91 mg/dL (ref 0.44–1.00)
GFR, Estimated: >60 mL/min (ref >60)
Glucose, Bld: 132 mg/dL — ABNORMAL HIGH (ref 70–99)
Potassium: 4.1 mmol/L (ref 3.5–5.1)
Sodium: 138 mmol/L (ref 135–145)

## 2021-11-15 LAB — CBC WITH DIFFERENTIAL/PLATELET
Abs Immature Granulocytes: 0.05 10*3/uL (ref 0.00–0.07)
Basophils Absolute: 0 10*3/uL (ref 0.0–0.1)
Basophils Relative: 0 %
Eosinophils Absolute: 0.2 10*3/uL (ref 0.0–0.5)
Eosinophils Relative: 2 %
HCT: 37 % (ref 36.0–46.0)
Hemoglobin: 12.6 g/dL (ref 12.0–15.0)
Immature Granulocytes: 0 %
Lymphocytes Relative: 20 %
Lymphs Abs: 2.3 10*3/uL (ref 0.7–4.0)
MCH: 30.2 pg (ref 26.0–34.0)
MCHC: 34.1 g/dL (ref 30.0–36.0)
MCV: 88.7 fL (ref 80.0–100.0)
Monocytes Absolute: 0.7 10*3/uL (ref 0.1–1.0)
Monocytes Relative: 6 %
Neutro Abs: 8.5 10*3/uL — ABNORMAL HIGH (ref 1.7–7.7)
Neutrophils Relative %: 72 %
Platelets: 298 10*3/uL (ref 150–400)
RBC: 4.17 MIL/uL (ref 3.87–5.11)
RDW: 15.3 % (ref 11.5–15.5)
WBC: 11.8 10*3/uL — ABNORMAL HIGH (ref 4.0–10.5)
nRBC: 0 % (ref 0.0–0.2)

## 2021-11-15 LAB — URINALYSIS, ROUTINE W REFLEX MICROSCOPIC
Bilirubin Urine: NEGATIVE
Glucose, UA: NEGATIVE mg/dL
Ketones, ur: NEGATIVE mg/dL
Nitrite: NEGATIVE
Protein, ur: >300 mg/dL — AB
RBC / HPF: >50 RBC/hpf — ABNORMAL HIGH (ref 0–5)
Specific Gravity, Urine: 1.021 (ref 1.005–1.030)
WBC, UA: >50 WBC/hpf — ABNORMAL HIGH (ref 0–5)
pH: 6.5 (ref 5.0–8.0)

## 2021-11-15 MED ORDER — CEPHALEXIN 500 MG PO CAPS: 500.0000 mg | ORAL_CAPSULE | Freq: Three times a day (TID) | ORAL | 0 refills | Status: DC

## 2021-11-15 MED ORDER — CEFTRIAXONE SODIUM 1 G IJ SOLR
1.0000 g | Freq: Once | INTRAMUSCULAR | Status: AC
  Administered 2021-11-15: 1 g via INTRAMUSCULAR
  Filled 2021-11-15: qty 10

## 2021-11-15 MED ORDER — SODIUM CHLORIDE 0.9 % IV BOLUS
1000.0000 mL | Freq: Once | INTRAVENOUS | Status: AC
  Administered 2021-11-15: 1000 mL via INTRAVENOUS

## 2021-11-15 MED ORDER — MORPHINE SULFATE (PF) 4 MG/ML IV SOLN
4.0000 mg | Freq: Once | INTRAVENOUS | Status: AC
  Administered 2021-11-15: 4 mg via INTRAVENOUS
  Filled 2021-11-15: qty 1

## 2021-11-15 MED ORDER — ONDANSETRON HCL 4 MG/2ML IJ SOLN
4.0000 mg | Freq: Once | INTRAMUSCULAR | Status: AC
  Administered 2021-11-15: 4 mg via INTRAVENOUS
  Filled 2021-11-15: qty 2

## 2021-11-15 NOTE — Discharge Instructions (Signed)
You were seen today for bloody urine.  You have evidence of urinary tract infection.  Take medications as prescribed.

## 2021-11-15 NOTE — ED Triage Notes (Addendum)
C/o cough that started yesterday. Also c/o of hematuria and lower back pain.  Denies any fevers. C/o of urinary frequency and burning. Denies a hx of kidney stones.

## 2021-11-15 NOTE — ED Provider Notes (Signed)
Deborah Haney   CSN: [id number] Arrival date & time: 11/15/21  0454     History  Chief Complaint  Patient presents with   Hematuria    Deborah Haney is a 56 y.o. female.  HPI     This is a 56 year old female who presents with left-sided flank pain.  Reports onset of symptoms yesterday.  She is also noted bloody urine.  No fevers but has had some dysuria and frequency.  Patient states that over the last week she has developed a nonproductive cough.  No known history of kidney stones.  He has not taken anything for her symptoms at home.  Home Medications Prior to Admission medications   Medication Sig Start Date End Date Taking? Authorizing Provider  cephALEXin (KEFLEX) 500 MG capsule Take 1 capsule (500 mg total) by mouth 3 (three) times daily. 11/15/21  Yes Angellina Ferdinand, Barbette Hair, MD  Accu-Chek Softclix Lancets lancets Use as instructed;  Check FSBS BID - Include strips # 50 with 5 refills, Lancets #50 with 5 refills DX: Type 2 DM - ICD 10: E11.9 04/16/21   Irene Pap, PA-C  Blood Glucose Monitoring Suppl (CONTOUR MONITOR) w/Device KIT 1 kit by Does not apply route 2 (two) times a day. 07/02/18   Henson, Vickie L, NP-C  cholestyramine (QUESTRAN) 4 g packet Take 1 packet (4 g total) by mouth daily. 07/07/21   Zehr, Janett Billow D, PA-C  CONTOUR NEXT TEST test strip USE 1 STRIP TO CHECK GLUCOSE TWICE DAILY 02/27/20   Henson, Vickie L, NP-C  hyoscyamine (LEVSIN SL) 0.125 MG SL tablet Take 1 tablet, Every 6-8 hours as needed 07/07/21   Zehr, Laban Emperor, PA-C  lisinopril-hydrochlorothiazide (ZESTORETIC) 10-12.5 MG tablet Take 1 tablet by mouth daily. 12/17/20   Tysinger, Camelia Eng, PA-C  mupirocin cream (BACTROBAN) 2 % Apply 1 application. topically 2 (two) times daily. 05/13/21   Irene Pap, PA-C  neomycin-colistin-hydrocortisone-thonzonium (COLY-MYCIN S) 3.05-07-08-0.5 MG/ML OTIC suspension Place 2 drops into the left ear 3 (three) times daily. 09/28/20    Denita Lung, MD  NEOMYCIN-POLYMYXIN-HYDROCORTISONE (CORTISPORIN) 1 % SOLN OTIC solution neomycin-polymyxin-hydrocort 3.5 mg/mL-10,000 unit/mL-1 % ear solution  INSTILL 2 DROPS INTO THE LEFT EAR THREE TIMES DAILY    [provider]  Probiotic Product (PROBIOTIC-10 PO) Take 1 mg by mouth 1 day or 1 dose. Pt. Takes every other day    [provider]  Vitamin D, Ergocalciferol, (DRISDOL) 1.25 MG (50000 UNIT) CAPS capsule Take 1 capsule (50,000 Units total) by mouth every 7 (seven) days. 06/17/21   Girtha Rm, NP-C      Allergies    Patient has no known allergies.    Review of Systems   Review of Systems  Genitourinary:  Positive for dysuria, flank pain and hematuria.  All other systems reviewed and are negative.   Physical Exam Updated Vital Signs BP (!) 140/84   Pulse 72   Temp 98.2 F (36.8 C) (Oral)   Resp 18   Ht 1.676 m ('5\' 6"' )   Wt 80.7 kg   LMP 04/25/2011   SpO2 97%   BMI 28.73 kg/m  Physical Exam Vitals and nursing Haney reviewed.  Constitutional:      Appearance: She is well-developed. She is not ill-appearing.  HENT:     Head: Normocephalic and atraumatic.  Eyes:     Pupils: Pupils are equal, round, and reactive to light.  Cardiovascular:     Rate and Rhythm: Normal rate  and regular rhythm.  Pulmonary:     Effort: Pulmonary effort is normal. No respiratory distress.  Abdominal:     Palpations: Abdomen is soft.     Tenderness: There is no abdominal tenderness. There is no right CVA tenderness.     Hernia: No hernia is present.  Musculoskeletal:     Cervical back: Neck supple.  Skin:    General: Skin is warm and dry.  Neurological:     Mental Status: She is alert and oriented to person, place, and time.  Psychiatric:        Mood and Affect: Mood normal.     ED Results / Procedures / Treatments   Labs (all labs ordered are listed, but only abnormal results are displayed) Labs Reviewed  URINALYSIS, ROUTINE W REFLEX MICROSCOPIC  - Abnormal; Notable for the following components:      Result Value   Color, Urine ORANGE (*)    APPearance CLOUDY (*)    Hgb urine dipstick LARGE (*)    Protein, ur >300 (*)    Leukocytes,Ua LARGE (*)    RBC / HPF >50 (*)    WBC, UA >50 (*)    Bacteria, UA FEW (*)    Non Squamous Epithelial 0-5 (*)    All other components within normal limits  CBC WITH DIFFERENTIAL/PLATELET - Abnormal; Notable for the following components:   WBC 11.8 (*)    Neutro Abs 8.5 (*)    All other components within normal limits  BASIC METABOLIC PANEL - Abnormal; Notable for the following components:   Glucose, Bld 132 (*)    All other components within normal limits  URINE CULTURE    EKG None  Radiology CT Renal Stone Study  Result Date: 11/15/2021 CLINICAL DATA:  Flank pain.  Evaluate for kidney stone. EXAM: CT ABDOMEN AND PELVIS WITHOUT CONTRAST TECHNIQUE: Multidetector CT imaging of the abdomen and pelvis was performed following the standard protocol without IV contrast. RADIATION DOSE REDUCTION: This exam was performed according to the departmental dose-optimization program which includes automated exposure control, adjustment of the mA and/or kV according to patient size and/or use of iterative reconstruction technique. COMPARISON:  09/23/2021 FINDINGS: Lower chest: Scar versus atelectasis noted within the lingula. Hepatobiliary: No focal liver abnormality is seen. Status post cholecystectomy. No biliary dilatation. Pancreas: Unremarkable. No pancreatic ductal dilatation or surrounding inflammatory changes. Spleen: Normal in size without focal abnormality. Adrenals/Urinary Tract: Normal adrenal glands. No nephrolithiasis or hydronephrosis. There is soft tissue stranding surrounding the bladder with underlying wall thickening. No bladder calculi identified. Stomach/Bowel: Stomach appears normal. The appendix is visualized and appears normal. No bowel wall thickening, inflammation, or distension.  Vascular/Lymphatic: Normal appearance of the abdominal aorta. No abdominopelvic adenopathy. Enlarged left inguinal lymph node measures 1.4 cm. Reproductive: Status post hysterectomy. Asymmetric enlargement of the right ovary measures 3.7 x 4.0 by 5.2 cm (volume = 40 cm^3), image 49/5. The left ovary appears normal. Other: No free fluid or fluid collections. Musculoskeletal: No acute or significant osseous findings. IMPRESSION: 1. No nephrolithiasis or hydronephrosis. 2. There is diffuse soft tissue stranding surrounding the bladder with underlying wall thickening. Correlate for any clinical signs or symptoms of cystitis. 3. Asymmetric enlargement of the left inguinal lymph node which may be reactive in the setting of acute cystitis. 4. Asymmetric enlargement of the right ovary. Consider further evaluation with pelvic sonogram. Electronically Signed   By: Kerby Moors M.D.   On: 11/15/2021 06:21    Procedures Procedures    Medications  Ordered in ED Medications  cefTRIAXone (ROCEPHIN) injection 1 g (has no administration in time range)  sodium chloride 0.9 % bolus 1,000 mL (1,000 mLs Intravenous New Bag/Given 11/15/21 0556)  morphine (PF) 4 MG/ML injection 4 mg (4 mg Intravenous Given 11/15/21 0557)  ondansetron (ZOFRAN) injection 4 mg (4 mg Intravenous Given 11/15/21 0557)    ED Course/ Medical Decision Making/ A&P                           Medical Decision Making Amount and/or Complexity of Data Reviewed Labs: ordered. Radiology: ordered.  Risk Prescription drug management.   This patient presents to the ED for concern of hematuria, flank pain, this involves an extensive number of treatment options, and is a complaint that carries with it a high risk of complications and morbidity.  I considered the following differential and admission for this acute, potentially life threatening condition.  The differential diagnosis includes acute cystitis, pyelonephritis, kidney stone  MDM:    This is  a 56 year old female who presents with hematuria and left flank pain.  She is nontoxic and vital signs are reassuring.  She is afebrile.  Labs obtained.  Patient given fluids, pain, nausea medication.  Labs notable for slight leukocytosis.  Urinalysis is consistent with UTI.  Urine culture added.  CT stone study obtained to evaluate for infected stone.  Stone study is negative but there is stranding about the bladder consistent with cystitis.  Suspect hemorrhagic cystitis.  Patient was given a dose of IM Rocephin.  Will discharge with Keflex 3 times daily.  She has no metabolic derangements and no kidney dysfunction.  (Labs, imaging, consults)  Labs: I Ordered, and personally interpreted labs.  The pertinent results include: Leukocytosis to 11  Imaging Studies ordered: I ordered imaging studies including CT stone study I independently visualized and interpreted imaging. I agree with the radiologist interpretation  Additional history obtained from chart review.  External records from outside source obtained and reviewed including prior evaluations  Cardiac Monitoring: The patient was maintained on a cardiac monitor.  I personally viewed and interpreted the cardiac monitored which showed an underlying rhythm of: Sinus rhythm  Reevaluation: After the interventions noted above, I reevaluated the patient and found that they have :improved  Social Determinants of Health: Lives independently  Disposition: Discharge  Co morbidities that complicate the patient evaluation  Past Medical History:  Diagnosis Date   Acute cholecystitis 07/14/2013   Acute on chronic respiratory failure with hypoxia ([place]) 09/05/2018   Anemia    has history, takes iron   Anxiety    Bipolar affective disorder ([place]) 10/26/2015   Has seen counselor, declined medications per medical record from [facility]. hypermanic   Chest pain 07/14/2013   Chronic vaginitis 10/26/2015   Has h/o vag dryness and tried Estrace per [place]  records   COVID-19 virus infection 08/24/2018   Depression    Diabetes ([place])    Patient denies   Diverticulitis    no problems   Dysfunctional uterine bleeding 03/02/2011   Elevated hemoglobin A1c 10/26/2015   GERD (gastroesophageal reflux disease)    tums prn   Headache(784.0)    Hemorrhoids    HTN (hypertension)    BP meds since fall 2016   Internal hemorrhoids    Nausea & vomiting 07/14/2013   Obesity 10/26/2015   Perimenopausal symptom 04/30/2021   Rectal bleeding 03/15/2017     Medicines Meds ordered this encounter  Medications  sodium chloride 0.9 % bolus 1,000 mL   morphine (PF) 4 MG/ML injection 4 mg   ondansetron (ZOFRAN) injection 4 mg   cefTRIAXone (ROCEPHIN) injection 1 g    Order Specific Question:   Antibiotic Indication:    Answer:   UTI   cephALEXin (KEFLEX) 500 MG capsule    Sig: Take 1 capsule (500 mg total) by mouth 3 (three) times daily.    Dispense:  21 capsule    Refill:  0    I have reviewed the patients home medicines and have made adjustments as needed  Problem List / ED Course: Problem List Items Addressed This Visit   None Visit Diagnoses     Acute cystitis with hematuria    -  Primary                   Final Clinical Impression(s) / ED Diagnoses Final diagnoses:  Acute cystitis with hematuria    Rx / DC Orders ED Discharge Orders          Ordered    cephALEXin (KEFLEX) 500 MG capsule  3 times daily        11/15/21 0647              Merryl Hacker, MD 11/15/21 [phone]

## 2021-11-15 NOTE — ED Notes (Signed)
Lab aware a urine culture needs to be added.

## 2021-11-16 ENCOUNTER — Encounter: Payer: Self-pay | Admitting: Family Medicine

## 2021-11-16 ENCOUNTER — Ambulatory Visit: Payer: Managed Care, Other (non HMO) | Admitting: Family Medicine

## 2021-11-16 VITALS — BP 130/88 | HR 64 | Temp 97.6°F | Ht 66.0 in | Wt 185.0 lb

## 2021-11-16 DIAGNOSIS — R319 Hematuria, unspecified: Secondary | ICD-10-CM

## 2021-11-16 DIAGNOSIS — D649 Anemia, unspecified: Secondary | ICD-10-CM | POA: Insufficient documentation

## 2021-11-16 DIAGNOSIS — R5383 Other fatigue: Secondary | ICD-10-CM | POA: Diagnosis not present

## 2021-11-16 DIAGNOSIS — N3001 Acute cystitis with hematuria: Secondary | ICD-10-CM

## 2021-11-16 DIAGNOSIS — R350 Frequency of micturition: Secondary | ICD-10-CM

## 2021-11-16 DIAGNOSIS — L0291 Cutaneous abscess, unspecified: Secondary | ICD-10-CM

## 2021-11-16 DIAGNOSIS — K219 Gastro-esophageal reflux disease without esophagitis: Secondary | ICD-10-CM | POA: Insufficient documentation

## 2021-11-16 DIAGNOSIS — R6883 Chills (without fever): Secondary | ICD-10-CM

## 2021-11-16 DIAGNOSIS — R051 Acute cough: Secondary | ICD-10-CM

## 2021-11-16 DIAGNOSIS — K5792 Diverticulitis of intestine, part unspecified, without perforation or abscess without bleeding: Secondary | ICD-10-CM | POA: Insufficient documentation

## 2021-11-16 LAB — URINE CULTURE: Culture: NO GROWTH

## 2021-11-16 LAB — POC COVID19 BINAXNOW: SARS Coronavirus 2 Ag: NEGATIVE

## 2021-11-16 NOTE — Assessment & Plan Note (Addendum)
Diagnosed in the ED and awaiting urine culture. CT renal stone done and no stones.  Rocephin IM given in ED and started on Keflex yesterday.

## 2021-11-16 NOTE — Assessment & Plan Note (Signed)
Negative Covid test today.  Reviewed ED notes and results. Mild leukocytosis.  On Keflex.  Follow up if worsening or not improving.

## 2021-11-16 NOTE — Patient Instructions (Addendum)
Continue taking keflex. Hydrate. Rest.   Use warm compresses on the abscess and keep the area as dry as possible.   Follow up with me in one week.

## 2021-11-16 NOTE — Progress Notes (Signed)
Subjective:     Patient ID: Deborah Haney, female    DOB: 02-01-1966, 56 y.o.   MRN: [id number]  Chief Complaint  Patient presents with   Urinary Frequency   Hematuria   Cough    Has gotten worse over the last week   Chills    Staff member has covid    HPI Patient is in today for a 2 week history of fatigue, chills, headaches, rhinorrhea, cough, hematuria.  Moving slowly, mental fog.   Decreased appetite but is eating.    ED visit yesterday and diagnosed with UTI. Started on Keflex. Given IM Rocephin.  Labs done.   Urinary pressure. No longer having hematuria or the severe pain.   C/o boil on lower abdomen x 4 weeks. Using warm compresses.   BS was 133 last night.   Denies chills, dizziness, chest pain, palpitations, shortness of breath, abdominal pain, N/V/D.   Health Maintenance Due  Topic Date Due   Zoster Vaccines- Shingrix (1 of 2) Never done   FOOT EXAM  11/14/2019    Past Medical History:  Diagnosis Date   Acute cholecystitis 07/14/2013   Acute on chronic respiratory failure with hypoxia ([place]) 09/05/2018   Anemia    has history, takes iron   Anxiety    Bipolar affective disorder ([place]) 10/26/2015   Has seen counselor, declined medications per medical record from [facility]. hypermanic   Chest pain 07/14/2013   Chronic vaginitis 10/26/2015   Has h/o vag dryness and tried Estrace per [place] records   COVID-19 virus infection 08/24/2018   Depression    Diabetes ([place])    Patient denies   Diverticulitis    no problems   Dysfunctional uterine bleeding 03/02/2011   Elevated hemoglobin A1c 10/26/2015   GERD (gastroesophageal reflux disease)    tums prn   Headache(784.0)    Hemorrhoids    HTN (hypertension)    BP meds since fall 2016   Internal hemorrhoids    Nausea & vomiting 07/14/2013   Obesity 10/26/2015   Perimenopausal symptom 04/30/2021   Rectal bleeding 03/15/2017    Past Surgical History:  Procedure Laterality Date   CHOLECYSTECTOMY N/A 07/15/2013    Procedure: LAPAROSCOPIC CHOLECYSTECTOMY;  Surgeon: Rolm Bookbinder, MD;  Location: [facility] OR;  Service: General;  Laterality: N/A;   COLONOSCOPY     ECTOPIC PREGNANCY SURGERY     laparotomy   HEMORRHOID BANDING     ROBOTIC ASSISTED LAP VAGINAL HYSTERECTOMY      Family History  Problem Relation Age of Onset   Diabetes Mother    Hypertension Mother    Arthritis Father 22   Hypertension Brother    Cancer Brother        blood    Cancer Brother        unsure kind, as a child    CVA Other    Colon cancer Neg Hx    Colon polyps Neg Hx    Esophageal cancer Neg Hx    Rectal cancer Neg Hx    Stomach cancer Neg Hx    Pancreatic cancer Neg Hx     Social History   Socioeconomic History   Marital status: Divorced    Spouse name: Not on file   Number of children: 0   Years of education: Not on file   Highest education level: Not on file  Occupational History   Occupation: Education officer, museum, Chief of Staff  Tobacco Use   Smoking status: Former    Types: Cigarettes  Quit date: 04/18/2008    Years since quitting: 13.5   Smokeless tobacco: Never  Vaping Use   Vaping Use: Never used  Substance and Sexual Activity   Alcohol use: Yes    Comment: 1 glass of wine rarely, occasionally   Drug use: Yes    Types: Marijuana    Comment: Former cocaine use since 2012   Sexual activity: Yes    Partners: Male    Birth control/protection: Surgical  Other Topics Concern   Not on file  Social History Narrative   Lives alone.     No children, no pets.   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   Social Determinants of Health   Financial Resource Strain: Not on file  Food Insecurity: Not on file  Transportation Needs: Not on file  Physical Activity: Unknown (08/31/2018)   Exercise Vital Sign    Days of Exercise per Week: Patient refused    Minutes of Exercise per Session: Patient refused  Stress: Not on file  Social Connections: Unknown (08/31/2018)   Social  Connection and Isolation Panel [NHANES]    Frequency of Communication with Friends and Family: Patient refused    Frequency of Social Gatherings with Friends and Family: Patient refused    Attends Religious Services: Patient refused    Active Member of Clubs or Organizations: Patient refused    Attends Archivist Meetings: Patient refused    Marital Status: Patient refused  Intimate Partner Violence: Unknown (08/31/2018)   Humiliation, Afraid, Rape, and Kick questionnaire    Fear of Current or Ex-Partner: Patient refused    Emotionally Abused: Patient refused    Physically Abused: Patient refused    Sexually Abused: Patient refused    Outpatient Medications Prior to Visit  Medication Sig Dispense Refill   Accu-Chek Softclix Lancets lancets Use as instructed;  Check FSBS BID - Include strips # 50 with 5 refills, Lancets #50 with 5 refills DX: Type 2 DM - ICD 10: E11.9 100 each 12   Blood Glucose Monitoring Suppl (CONTOUR MONITOR) w/Device KIT 1 kit by Does not apply route 2 (two) times a day. 1 kit 0   cephALEXin (KEFLEX) 500 MG capsule Take 1 capsule (500 mg total) by mouth 3 (three) times daily. 21 capsule 0   cholestyramine (QUESTRAN) 4 g packet Take 1 packet (4 g total) by mouth daily. 30 each 3   CONTOUR NEXT TEST test strip USE 1 STRIP TO CHECK GLUCOSE TWICE DAILY 100 each 0   hyoscyamine (LEVSIN SL) 0.125 MG SL tablet Take 1 tablet, Every 6-8 hours as needed 30 tablet 0   lisinopril-hydrochlorothiazide (ZESTORETIC) 10-12.5 MG tablet Take 1 tablet by mouth daily. 90 tablet 3   mupirocin cream (BACTROBAN) 2 % Apply 1 application. topically 2 (two) times daily. 15 g 1   neomycin-colistin-hydrocortisone-thonzonium (COLY-MYCIN S) 3.05-07-08-0.5 MG/ML OTIC suspension Place 2 drops into the left ear 3 (three) times daily. 10 mL 0   NEOMYCIN-POLYMYXIN-HYDROCORTISONE (CORTISPORIN) 1 % SOLN OTIC solution neomycin-polymyxin-hydrocort 3.5 mg/mL-10,000 unit/mL-1 % ear solution  INSTILL 2  DROPS INTO THE LEFT EAR THREE TIMES DAILY     Probiotic Product (PROBIOTIC-10 PO) Take 1 mg by mouth 1 day or 1 dose. Pt. Takes every other day     Vitamin D, Ergocalciferol, (DRISDOL) 1.25 MG (50000 UNIT) CAPS capsule Take 1 capsule (50,000 Units total) by mouth every 7 (seven) days. 12 capsule 0   No facility-administered medications prior to visit.    No  Known Allergies  ROS     Objective:    Physical Exam Constitutional:      General: She is not in acute distress.    Appearance: She is ill-appearing.  Eyes:     Extraocular Movements: Extraocular movements intact.     Conjunctiva/sclera: Conjunctivae normal.     Pupils: Pupils are equal, round, and reactive to light.  Cardiovascular:     Rate and Rhythm: Normal rate and regular rhythm.  Pulmonary:     Effort: Pulmonary effort is normal.     Breath sounds: Normal breath sounds.  Abdominal:     General: Bowel sounds are normal.     Palpations: Abdomen is soft.     Tenderness: There is no abdominal tenderness. There is no right CVA tenderness, left CVA tenderness, guarding or rebound.       Comments: Abscess to lower abdominal fold, soft, non tender, no fluctuance. No surrounding erythema or drainage.   Musculoskeletal:        General: Normal range of motion.     Cervical back: Normal range of motion and neck supple.  Lymphadenopathy:     Cervical: No cervical adenopathy.  Skin:    General: Skin is warm and dry.  Neurological:     General: No focal deficit present.     Mental Status: She is alert and oriented to person, place, and time.     Cranial Nerves: No cranial nerve deficit.     Motor: No weakness.     Coordination: Coordination normal.     Gait: Gait normal.  Psychiatric:        Mood and Affect: Mood normal.        Behavior: Behavior normal.     BP 130/88 (BP Location: Left Arm, Patient Position: Sitting, Cuff Size: Large) Comment: no BP meds  Pulse 64   Temp 97.6 F (36.4 C) (Temporal)   Ht 5' 6"  (1.676 m)   Wt 185 lb (83.9 kg)   LMP 04/25/2011   SpO2 99%   BMI 29.86 kg/m  Wt Readings from Last 3 Encounters:  11/16/21 185 lb (83.9 kg)  11/15/21 178 lb (80.7 kg)  09/22/21 187 lb (84.8 kg)        Assessment & Plan:   Problem List Items Addressed This Visit       Genitourinary   Acute cystitis with hematuria    Diagnosed in the ED and awaiting urine culture. CT renal stone done and no stones.  Rocephin IM given in ED and started on Keflex yesterday.          Other   Abscess    This is a new problem. Overall improving per patient. No longer tender. Continue using warm compresses. Keep area dry. Taking Keflex. Follow up if worsening.       Acute cough    Negative Covid test today.  Reviewed ED notes and results. Mild leukocytosis.  On Keflex.  Follow up if worsening or not improving.       Fatigue - Primary    Negative Covid test.  On antibiotic for acute cystitis.  Follow up if not improving.       Frequent urination   Other Visit Diagnoses     Hematuria, unspecified type       Chills       Relevant Orders   POC COVID-19 (Completed)       I am having Hattye N. Cerra maintain her Contour Monitor, Contour Next Test, Probiotic Product (  PROBIOTIC-10 PO), Coly-Mycin S, lisinopril-hydrochlorothiazide, Accu-Chek Softclix Lancets, mupirocin cream, Vitamin D (Ergocalciferol), NEOMYCIN-POLYMYXIN-HYDROCORTISONE, cholestyramine, hyoscyamine, and cephALEXin.  No orders of the defined types were placed in this encounter.

## 2021-11-16 NOTE — Assessment & Plan Note (Signed)
Negative Covid test.  On antibiotic for acute cystitis.  Follow up if not improving.

## 2021-11-16 NOTE — Assessment & Plan Note (Signed)
This is a new problem. Overall improving per patient. No longer tender. Continue using warm compresses. Keep area dry. Taking Keflex. Follow up if worsening.

## 2021-11-17 ENCOUNTER — Encounter: Payer: Self-pay | Admitting: Gastroenterology

## 2021-11-19 ENCOUNTER — Telehealth: Payer: Self-pay | Admitting: Internal Medicine

## 2021-11-19 NOTE — Telephone Encounter (Signed)
Pt instructions have been sent to the pt via My Chart

## 2021-11-19 NOTE — Telephone Encounter (Signed)
Spoke with patient to remind her of her procedure coming up on 11-24-21 and she stated she did not have her instructions.   Please send prep instructions to her my chart account.  Thank you

## 2021-11-22 ENCOUNTER — Telehealth: Payer: Self-pay | Admitting: Internal Medicine

## 2021-11-22 NOTE — Telephone Encounter (Signed)
New instructions have been sent to the pt My Chart.  Left message notifying pt.

## 2021-11-22 NOTE — Telephone Encounter (Signed)
Patient called to reschedule EGD AND COLON from 9/20 to 9/25. Requesting a call back on how to use prep correctly. Please call to advise.

## 2021-11-24 ENCOUNTER — Encounter: Payer: Managed Care, Other (non HMO) | Admitting: Internal Medicine

## 2021-11-29 ENCOUNTER — Telehealth: Payer: Self-pay | Admitting: Internal Medicine

## 2021-11-29 ENCOUNTER — Encounter: Payer: Self-pay | Admitting: Internal Medicine

## 2021-11-29 ENCOUNTER — Ambulatory Visit: Payer: Managed Care, Other (non HMO) | Admitting: Internal Medicine

## 2021-11-29 VITALS — BP 152/74 | HR 67 | Temp 98.4°F | Resp 12 | Ht 66.0 in | Wt 188.0 lb

## 2021-11-29 DIAGNOSIS — R634 Abnormal weight loss: Secondary | ICD-10-CM | POA: Diagnosis not present

## 2021-11-29 DIAGNOSIS — K635 Polyp of colon: Secondary | ICD-10-CM | POA: Diagnosis not present

## 2021-11-29 DIAGNOSIS — K319 Disease of stomach and duodenum, unspecified: Secondary | ICD-10-CM

## 2021-11-29 DIAGNOSIS — R197 Diarrhea, unspecified: Secondary | ICD-10-CM | POA: Diagnosis not present

## 2021-11-29 DIAGNOSIS — B3781 Candidal esophagitis: Secondary | ICD-10-CM

## 2021-11-29 DIAGNOSIS — R194 Change in bowel habit: Secondary | ICD-10-CM

## 2021-11-29 DIAGNOSIS — K648 Other hemorrhoids: Secondary | ICD-10-CM | POA: Diagnosis not present

## 2021-11-29 DIAGNOSIS — R109 Unspecified abdominal pain: Secondary | ICD-10-CM

## 2021-11-29 DIAGNOSIS — D125 Benign neoplasm of sigmoid colon: Secondary | ICD-10-CM

## 2021-11-29 DIAGNOSIS — K2960 Other gastritis without bleeding: Secondary | ICD-10-CM | POA: Diagnosis not present

## 2021-11-29 MED ORDER — OMEPRAZOLE 40 MG PO CPDR: 40.0000 mg | DELAYED_RELEASE_CAPSULE | Freq: Two times a day (BID) | ORAL | 3 refills | Status: DC

## 2021-11-29 MED ORDER — FLUCONAZOLE 200 MG PO TABS: 400.0000 mg | ORAL_TABLET | Freq: Once | ORAL | 0 refills | Status: AC

## 2021-11-29 MED ORDER — SODIUM CHLORIDE 0.9 % IV SOLN: 500.0000 mL | Freq: Once | INTRAVENOUS | Status: DC

## 2021-11-29 NOTE — Progress Notes (Signed)
Report given to PACU, vss 

## 2021-11-29 NOTE — Op Note (Signed)
[facility] Patient Name: Deborah Haney Procedure Date: 11/29/2021 10:50 AM MRN: [id number] Endoscopist: Sonny Masters "Deborah Haney ,  Age: 56 Referring MD:  Date of Birth: 05-10-1965 Gender: Female Account #: [id number] Procedure:                Upper GI endoscopy Indications:              Generalized abdominal pain, Diarrhea, Weight loss Medicines:                Monitored Anesthesia Care Procedure:                Pre-Anesthesia Assessment:                           - Prior to the procedure, a History and Physical                            was performed, and patient medications and                            allergies were reviewed. The patient's tolerance of                            previous anesthesia was also reviewed. The risks                            and benefits of the procedure and the sedation                            options and risks were discussed with the patient.                            All questions were answered, and informed consent                            was obtained. Prior Anticoagulants: The patient has                            taken no previous anticoagulant or antiplatelet                            agents. ASA Grade Assessment: II - A patient with                            mild systemic disease. After reviewing the risks                            and benefits, the patient was deemed in                            satisfactory condition to undergo the procedure.                           After obtaining informed consent, the endoscope was  passed under direct vision. Throughout the                            procedure, the patient's blood pressure, pulse, and                            oxygen saturations were monitored continuously. The                            GIF [id number] #[id number] was introduced through the                            mouth, and advanced to the second part of duodenum.                            The  upper GI endoscopy was accomplished without                            difficulty. The patient tolerated the procedure                            well. Scope In: Scope Out: Findings:                 White nummular lesions were noted in the entire                            esophagus. Biopsies were taken with a cold forceps                            for histology.                           Localized mild inflammation characterized by                            congestion (edema) and erythema was found in the                            gastric antrum. Biopsies were taken with a cold                            forceps for histology.                           The examined duodenum was normal. Biopsies were                            taken with a cold forceps for histology. Complications:            No immediate complications. Estimated Blood Loss:     Estimated blood loss was minimal. Impression:               - White nummular lesions in esophageal mucosa.  Biopsied.                           - Gastritis. Biopsied.                           - Normal examined duodenum. Biopsied. Recommendation:           - Await pathology results.                           - Diflucan (fluconazole) 400 mg PO daily X 1 dose,                            followed by 200 mg PO daily x 13 doses.                           - Use Prilosec (omeprazole) 40 mg PO BID for 8                            weeks.                           - Perform a colonoscopy today. Dr Georgian Co "Deborah Haney" Deborah Haney,  11/29/2021 11:30:56 AM

## 2021-11-29 NOTE — Telephone Encounter (Signed)
Inbound call from patient stating that she had  a procedure with Dr. Lorenso Courier today and she proscribed  DIFLUCAN and was not given what Dr. Lorenso Courier wanted her to take as far as that medication dose. Patient is requesting a call back to discuss. Please advise.

## 2021-11-29 NOTE — Progress Notes (Signed)
GASTROENTEROLOGY PROCEDURE H&P NOTE   Primary Care Physician: Girtha Rm, NP-C    Reason for Procedure:   Generalized ab discomfort, change in bowel habits, weight loss  Plan:    EGD/colonoscopy  Patient is appropriate for endoscopic procedure(s) in the ambulatory ([facility]) setting.  The nature of the procedure, as well as the risks, benefits, and alternatives were carefully and thoroughly reviewed with the patient. Ample time for discussion and questions allowed. The patient understood, was satisfied, and agreed to proceed.     HPI: Deborah Haney is a 56 y.o. female who presents for EGD/colonoscopy for evaluation of generalized ab discomfort, change in bowel habits, weight loss.  Patient was most recently seen in the Gastroenterology Clinic on 09/15/21.  No interval change in medical history since that appointment. Please refer to that note for full details regarding GI history and clinical presentation.   Past Medical History:  Diagnosis Date   Acute cholecystitis 07/14/2013   Acute on chronic respiratory failure with hypoxia ([place]) 09/05/2018   Anemia    has history, takes iron   Anxiety    Bipolar affective disorder ([place]) 10/26/2015   Has seen counselor, declined medications per medical record from [facility]. hypermanic   Chest pain 07/14/2013   Chronic vaginitis 10/26/2015   Has h/o vag dryness and tried Estrace per [facility] records   COVID-19 virus infection 08/24/2018   Depression    Diabetes ([place])    Patient denies   Diverticulitis    no problems   Dysfunctional uterine bleeding 03/02/2011   Elevated hemoglobin A1c 10/26/2015   GERD (gastroesophageal reflux disease)    tums prn   Headache(784.0)    Hemorrhoids    HTN (hypertension)    BP meds since fall 2016   Internal hemorrhoids    Nausea & vomiting 07/14/2013   Obesity 10/26/2015   Perimenopausal symptom 04/30/2021   Rectal bleeding 03/15/2017    Past Surgical History:  Procedure Laterality Date   CHOLECYSTECTOMY  N/A 07/15/2013   Procedure: LAPAROSCOPIC CHOLECYSTECTOMY;  Surgeon: Rolm Bookbinder, MD;  Location: [facility];  Service: General;  Laterality: N/A;   COLONOSCOPY     ECTOPIC PREGNANCY SURGERY     laparotomy   HEMORRHOID BANDING     ROBOTIC ASSISTED LAP VAGINAL HYSTERECTOMY      Prior to Admission medications   Medication Sig Start Date End Date Taking? Authorizing Provider  Accu-Chek Softclix Lancets lancets Use as instructed;  Check FSBS BID - Include strips # 50 with 5 refills, Lancets #50 with 5 refills DX: Type 2 DM - ICD 10: E11.9 04/16/21   Irene Pap, PA-C  Blood Glucose Monitoring Suppl (CONTOUR MONITOR) w/Device KIT 1 kit by Does not apply route 2 (two) times a day. 07/02/18   Henson, Vickie L, NP-C  cephALEXin (KEFLEX) 500 MG capsule Take 1 capsule (500 mg total) by mouth 3 (three) times daily. 11/15/21   Horton, Barbette Hair, MD  cholestyramine (QUESTRAN) 4 g packet Take 1 packet (4 g total) by mouth daily. 07/07/21   Zehr, Janett Billow D, PA-C  CONTOUR NEXT TEST test strip USE 1 STRIP TO CHECK GLUCOSE TWICE DAILY 02/27/20   Henson, Vickie L, NP-C  hyoscyamine (LEVSIN SL) 0.125 MG SL tablet Take 1 tablet, Every 6-8 hours as needed 07/07/21   Zehr, Laban Emperor, PA-C  lisinopril-hydrochlorothiazide (ZESTORETIC) 10-12.5 MG tablet Take 1 tablet by mouth daily. 12/17/20   Tysinger, Camelia Eng, PA-C  mupirocin cream (BACTROBAN) 2 % Apply 1 application. topically 2 (  two) times daily. 05/13/21   Jake Shark, PA-C  neomycin-colistin-hydrocortisone-thonzonium (COLY-MYCIN S) 3.05-07-08-0.5 MG/ML OTIC suspension Place 2 drops into the left ear 3 (three) times daily. 09/28/20   Ronnald Nian, MD  NEOMYCIN-POLYMYXIN-HYDROCORTISONE (CORTISPORIN) 1 % SOLN OTIC solution neomycin-polymyxin-hydrocort 3.5 mg/mL-10,000 unit/mL-1 % ear solution  INSTILL 2 DROPS INTO THE LEFT EAR THREE TIMES DAILY    [provider]  Probiotic Product (PROBIOTIC-10 PO) Take 1 mg by mouth 1 day or 1 dose. Pt. Takes every other  day    [provider]  Vitamin D, Ergocalciferol, (DRISDOL) 1.25 MG (50000 UNIT) CAPS capsule Take 1 capsule (50,000 Units total) by mouth every 7 (seven) days. 06/17/21   Avanell Shackleton, NP-C    Current Outpatient Medications  Medication Sig Dispense Refill   Accu-Chek Softclix Lancets lancets Use as instructed;  Check FSBS BID - Include strips # 50 with 5 refills, Lancets #50 with 5 refills DX: Type 2 DM - ICD 10: E11.9 100 each 12   Blood Glucose Monitoring Suppl (CONTOUR MONITOR) w/Device KIT 1 kit by Does not apply route 2 (two) times a day. 1 kit 0   cephALEXin (KEFLEX) 500 MG capsule Take 1 capsule (500 mg total) by mouth 3 (three) times daily. 21 capsule 0   cholestyramine (QUESTRAN) 4 g packet Take 1 packet (4 g total) by mouth daily. 30 each 3   CONTOUR NEXT TEST test strip USE 1 STRIP TO CHECK GLUCOSE TWICE DAILY 100 each 0   hyoscyamine (LEVSIN SL) 0.125 MG SL tablet Take 1 tablet, Every 6-8 hours as needed 30 tablet 0   lisinopril-hydrochlorothiazide (ZESTORETIC) 10-12.5 MG tablet Take 1 tablet by mouth daily. 90 tablet 3   mupirocin cream (BACTROBAN) 2 % Apply 1 application. topically 2 (two) times daily. 15 g 1   neomycin-colistin-hydrocortisone-thonzonium (COLY-MYCIN S) 3.05-07-08-0.5 MG/ML OTIC suspension Place 2 drops into the left ear 3 (three) times daily. 10 mL 0   NEOMYCIN-POLYMYXIN-HYDROCORTISONE (CORTISPORIN) 1 % SOLN OTIC solution neomycin-polymyxin-hydrocort 3.5 mg/mL-10,000 unit/mL-1 % ear solution  INSTILL 2 DROPS INTO THE LEFT EAR THREE TIMES DAILY     Probiotic Product (PROBIOTIC-10 PO) Take 1 mg by mouth 1 day or 1 dose. Pt. Takes every other day     Vitamin D, Ergocalciferol, (DRISDOL) 1.25 MG (50000 UNIT) CAPS capsule Take 1 capsule (50,000 Units total) by mouth every 7 (seven) days. 12 capsule 0   Current Facility-Administered Medications  Medication Dose Route Frequency Provider Last Rate Last Admin   0.9 %  sodium chloride infusion  500 mL  Intravenous Once Imogene Burn, MD        Allergies as of 11/29/2021   (No Known Allergies)    Family History  Problem Relation Age of Onset   Diabetes Mother    Hypertension Mother    Arthritis Father 52   Hypertension Brother    Cancer Brother        blood    Cancer Brother        unsure kind, as a child    CVA Other    Colon cancer Neg Hx    Colon polyps Neg Hx    Esophageal cancer Neg Hx    Rectal cancer Neg Hx    Stomach cancer Neg Hx    Pancreatic cancer Neg Hx     Social History   Socioeconomic History   Marital status: Divorced    Spouse name: Not on file   Number of children: 0  Years of education: Not on file   Highest education level: Not on file  Occupational History   Occupation: Education officer, museum, Chief of Staff  Tobacco Use   Smoking status: Former    Types: Cigarettes    Quit date: 04/18/2008    Years since quitting: 13.6   Smokeless tobacco: Never  Vaping Use   Vaping Use: Never used  Substance and Sexual Activity   Alcohol use: Yes    Comment: 1 glass of wine rarely, occasionally   Drug use: Yes    Types: Marijuana    Comment: Former cocaine use since 2012   Sexual activity: Yes    Partners: Male    Birth control/protection: Surgical  Other Topics Concern   Not on file  Social History Narrative   Lives alone.     No children, no pets.   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   Social Determinants of Health   Financial Resource Strain: Not on file  Food Insecurity: Not on file  Transportation Needs: Not on file  Physical Activity: Unknown (08/31/2018)   Exercise Vital Sign    Days of Exercise per Week: Patient refused    Minutes of Exercise per Session: Patient refused  Stress: Not on file  Social Connections: Unknown (08/31/2018)   Social Connection and Isolation Panel [NHANES]    Frequency of Communication with Friends and Family: Patient refused    Frequency of Social Gatherings with Friends and  Family: Patient refused    Attends Religious Services: Patient refused    Active Member of Clubs or Organizations: Patient refused    Attends Archivist Meetings: Patient refused    Marital Status: Patient refused  Intimate Partner Violence: Unknown (08/31/2018)   Humiliation, Afraid, Rape, and Kick questionnaire    Fear of Current or Ex-Partner: Patient refused    Emotionally Abused: Patient refused    Physically Abused: Patient refused    Sexually Abused: Patient refused    Physical Exam: Vital signs in last 24 hours: BP 135/84   Pulse 64   Temp 98.4 F (36.9 C)   Ht _0  (1.676 m)   Wt 188 lb (85.3 kg)   LMP 04/25/2011   SpO2 99%   BMI 30.34 kg/m  GEN: NAD EYE: Sclerae anicteric ENT: MMM CV: Non-tachycardic Pulm: No increased WOB GI: Soft NEURO:  Alert & Oriented   Christia Reading, MD [facility]   11/29/2021 10:19 AM

## 2021-11-29 NOTE — Patient Instructions (Signed)
Diflucan (fluconazole) 400 mg by mouth daily X one dose, followed by 200 mg by mouth daily X 13 doses. Use Prilosec (omeprazole) 40 mg twice daily for 8 weeks.  Return to GI clinic in 6 weeks.       YOU HAD AN ENDOSCOPIC PROCEDURE TODAY AT [facility]:   Refer to the procedure report that was given to you for any specific questions about what was found during the examination.  If the procedure report does not answer your questions, please call your gastroenterologist to clarify.  If you requested that your care partner not be given the details of your procedure findings, then the procedure report has been included in a sealed envelope for you to review at your convenience later.  YOU SHOULD EXPECT: Some feelings of bloating in the abdomen. Passage of more gas than usual.  Walking can help get rid of the air that was put into your GI tract during the procedure and reduce the bloating. If you had a lower endoscopy (such as a colonoscopy or flexible sigmoidoscopy) you may notice spotting of blood in your stool or on the toilet paper. If you underwent a bowel prep for your procedure, you may not have a normal bowel movement for a few days.  Please Note:  You might notice some irritation and congestion in your nose or some drainage.  This is from the oxygen used during your procedure.  There is no need for concern and it should clear up in a day or so.  SYMPTOMS TO REPORT IMMEDIATELY:  Following lower endoscopy (colonoscopy or flexible sigmoidoscopy):  Excessive amounts of blood in the stool  Significant tenderness or worsening of abdominal pains  Swelling of the abdomen that is new, acute  Fever of 100F or higher  Following upper endoscopy (EGD)  Vomiting of blood or coffee ground material  New chest pain or pain under the shoulder blades  Painful or persistently difficult swallowing  New shortness of breath  Fever of 100F or higher  Black, tarry-looking stools  For  urgent or emergent issues, a gastroenterologist can be reached at any hour by calling [phone]. Do not use MyChart messaging for urgent concerns.    DIET:  We do recommend a small meal at first, but then you may proceed to your regular diet.  Drink plenty of fluids but you should avoid alcoholic beverages for 24 hours.  ACTIVITY:  You should plan to take it easy for the rest of today and you should NOT DRIVE or use heavy machinery until tomorrow (because of the sedation medicines used during the test).    FOLLOW UP: Our staff will call the number listed on your records the next business day following your procedure.  We will call around 7:15- 8:00 am to check on you and address any questions or concerns that you may have regarding the information given to you following your procedure. If we do not reach you, we will leave a message.     If any biopsies were taken you will be contacted by phone or by letter within the next 1-3 weeks.  Please call us at [phone] if you have not heard about the biopsies in 3 weeks.    SIGNATURES/CONFIDENTIALITY: You and/or your care partner have signed paperwork which will be entered into your electronic medical record.  These signatures attest to the fact that that the information above on your After Visit Summary has been reviewed and is understood.  Full responsibility  of the confidentiality of this discharge information lies with you and/or your care-partner.

## 2021-11-29 NOTE — Progress Notes (Signed)
1049 Robinul 0.1 mg IV given due large amount of secretions upon assessment.  MD made aware, vss

## 2021-11-29 NOTE — Op Note (Signed)
[facility] Patient Name: Deborah Haney Procedure Date: 11/29/2021 10:49 AM MRN: [id number] Endoscopist: Sonny Masters "Deborah Haney ,  Age: 56 Referring MD:  Date of Birth: 04/16/1965 Gender: Female Account #: [id number] Procedure:                Colonoscopy Indications:              Generalized abdominal pain, Diarrhea, Weight loss Medicines:                Monitored Anesthesia Care Procedure:                Pre-Anesthesia Assessment:                           - Prior to the procedure, a History and Physical                            was performed, and patient medications and                            allergies were reviewed. The patient's tolerance of                            previous anesthesia was also reviewed. The risks                            and benefits of the procedure and the sedation                            options and risks were discussed with the patient.                            All questions were answered, and informed consent                            was obtained. Prior Anticoagulants: The patient has                            taken no previous anticoagulant or antiplatelet                            agents. ASA Grade Assessment: II - A patient with                            mild systemic disease. After reviewing the risks                            and benefits, the patient was deemed in                            satisfactory condition to undergo the procedure.                           After obtaining informed consent, the colonoscope  was passed under direct vision. Throughout the                            procedure, the patient's blood pressure, pulse, and                            oxygen saturations were monitored continuously. The                            Colonoscope was introduced through the anus and                            advanced to the the terminal ileum. The colonoscopy                            was  performed without difficulty. The patient                            tolerated the procedure well. The quality of the                            bowel preparation was good. The terminal ileum,                            ileocecal valve, appendiceal orifice, and rectum                            were photographed. Scope In: 11:08:11 AM Scope Out: 11:23:32 AM Scope Withdrawal Time: 0 hours 11 minutes 24 seconds  Total Procedure Duration: 0 hours 15 minutes 21 seconds  Findings:                 The terminal ileum appeared normal.                           A 3 mm polyp was found in the sigmoid colon. The                            polyp was sessile. The polyp was removed with a                            cold snare. Resection and retrieval were complete.                           Non-bleeding internal hemorrhoids were found during                            retroflexion.                           Biopsies for histology were taken with a cold                            forceps from the entire colon for evaluation of  microscopic colitis. Complications:            No immediate complications. Estimated Blood Loss:     Estimated blood loss was minimal. Impression:               - The examined portion of the ileum was normal.                           - One 3 mm polyp in the sigmoid colon, removed with                            a cold snare. Resected and retrieved.                           - Non-bleeding internal hemorrhoids.                           - Biopsies were taken with a cold forceps from the                            entire colon for evaluation of microscopic colitis. Recommendation:           - Discharge patient to home (with escort).                           - Await pathology results.                           - Return to GI clinic in 6 weeks.                           - The findings and recommendations were discussed                            with the  patient. Dr Georgian Co "Deborah Haney" Deborah Haney,  11/29/2021 11:33:41 AM

## 2021-11-29 NOTE — Progress Notes (Signed)
Called to room to assist during endoscopic procedure.  Patient ID and intended procedure confirmed with present staff. Received instructions for my participation in the procedure from the performing physician.  

## 2021-11-30 ENCOUNTER — Telehealth: Payer: Self-pay

## 2021-11-30 MED ORDER — FLUCONAZOLE 200 MG PO TABS: 200.0000 mg | ORAL_TABLET | Freq: Every day | ORAL | 0 refills | Status: DC

## 2021-11-30 NOTE — Telephone Encounter (Signed)
Returned the patient call and apologized for the delay.  Dr. Lorenso Courier had ordered 400 mg of Diflucan for one dose and 200 mg daily for 13 doses.  Only the 400 mg was sent to pharmacy.  I sent the order for 200 mg x13 to the pharmacy on file.  Patient was appreciative of the return call and will pick up script today.

## 2021-11-30 NOTE — Telephone Encounter (Signed)
First post procedure follow up call, no answer 

## 2021-12-03 ENCOUNTER — Encounter: Payer: Self-pay | Admitting: Internal Medicine

## 2022-01-24 ENCOUNTER — Encounter: Payer: Self-pay | Admitting: Family Medicine

## 2022-01-24 ENCOUNTER — Other Ambulatory Visit (HOSPITAL_COMMUNITY)
Admission: RE | Admit: 2022-01-24 | Discharge: 2022-01-24 | Disposition: A | Discharge status: Home / Self Care | Payer: Managed Care, Other (non HMO) | Source: Ambulatory Visit | Attending: Family Medicine | Admitting: Family Medicine

## 2022-01-24 ENCOUNTER — Ambulatory Visit: Payer: Managed Care, Other (non HMO) | Admitting: Family Medicine

## 2022-01-24 VITALS — BP 118/76 | HR 73 | Temp 97.8°F | Ht 66.0 in | Wt 179.0 lb

## 2022-01-24 DIAGNOSIS — E1165 Type 2 diabetes mellitus with hyperglycemia: Secondary | ICD-10-CM | POA: Diagnosis not present

## 2022-01-24 DIAGNOSIS — R197 Diarrhea, unspecified: Secondary | ICD-10-CM | POA: Diagnosis not present

## 2022-01-24 DIAGNOSIS — Z9189 Other specified personal risk factors, not elsewhere classified: Secondary | ICD-10-CM | POA: Diagnosis present

## 2022-01-24 DIAGNOSIS — N898 Other specified noninflammatory disorders of vagina: Secondary | ICD-10-CM | POA: Insufficient documentation

## 2022-01-24 DIAGNOSIS — R5383 Other fatigue: Secondary | ICD-10-CM

## 2022-01-24 LAB — CBC WITH DIFFERENTIAL/PLATELET
Basophils Absolute: 0 10*3/uL (ref 0.0–0.1)
Basophils Relative: 0.4 % (ref 0.0–3.0)
Eosinophils Absolute: 0.1 10*3/uL (ref 0.0–0.7)
Eosinophils Relative: 0.9 % (ref 0.0–5.0)
HCT: 37.2 % (ref 36.0–46.0)
Hemoglobin: 12.5 g/dL (ref 12.0–15.0)
Lymphocytes Relative: 32.4 % (ref 12.0–46.0)
Lymphs Abs: 1.9 10*3/uL (ref 0.7–4.0)
MCHC: 33.6 g/dL (ref 30.0–36.0)
MCV: 89.8 fl (ref 78.0–100.0)
Monocytes Absolute: 0.5 10*3/uL (ref 0.1–1.0)
Monocytes Relative: 8.1 % (ref 3.0–12.0)
Neutro Abs: 3.5 10*3/uL (ref 1.4–7.7)
Neutrophils Relative %: 58.2 % (ref 43.0–77.0)
Platelets: 294 10*3/uL (ref 150.0–400.0)
RBC: 4.14 Mil/uL (ref 3.87–5.11)
RDW: 14.8 % (ref 11.5–15.5)
WBC: 5.9 10*3/uL (ref 4.0–10.5)

## 2022-01-24 LAB — COMPREHENSIVE METABOLIC PANEL
ALT: 12 U/L (ref 0–35)
AST: 13 U/L (ref 0–37)
Albumin: 4.3 g/dL (ref 3.5–5.2)
Alkaline Phosphatase: 55 U/L (ref 39–117)
BUN: 9 mg/dL (ref 6–23)
CO2: 28 mEq/L (ref 19–32)
Calcium: 9.2 mg/dL (ref 8.4–10.5)
Chloride: 103 mEq/L (ref 96–112)
Creatinine, Ser: 0.81 mg/dL (ref 0.40–1.20)
GFR: 82.42 mL/min (ref >60.00)
Glucose, Bld: 90 mg/dL (ref 70–99)
Potassium: 3.5 mEq/L (ref 3.5–5.1)
Sodium: 139 mEq/L (ref 135–145)
Total Bilirubin: 0.5 mg/dL (ref 0.2–1.2)
Total Protein: 8 g/dL (ref 6.0–8.3)

## 2022-01-24 LAB — T4, FREE: Free T4: 0.88 ng/dL (ref 0.60–1.60)

## 2022-01-24 LAB — VITAMIN B12: Vitamin B-12: 296 pg/mL (ref 211–911)

## 2022-01-24 LAB — HEMOGLOBIN A1C: Hgb A1c MFr Bld: 6.5 % (ref 4.6–6.5)

## 2022-01-24 LAB — TSH: TSH: 0.96 u[IU]/mL (ref 0.35–5.50)

## 2022-01-24 LAB — MICROALBUMIN / CREATININE URINE RATIO
Creatinine,U: 153.1 mg/dL
Microalb Creat Ratio: 0.9 mg/g (ref 0.0–30.0)
Microalb, Ur: 1.4 mg/dL (ref 0.0–1.9)

## 2022-01-24 LAB — VITAMIN D 25 HYDROXY (VIT D DEFICIENCY, FRACTURES): VITD: 35.27 ng/mL (ref 30.00–100.00)

## 2022-01-24 NOTE — Progress Notes (Signed)
Subjective:     Patient ID: Deborah Haney, female    DOB: [birth year], 56 y.o.   MRN: [id number]  Chief Complaint  Patient presents with   Follow-up    Thinks Lucrezia Starch may be giving her upset stomach, has been going on for about 2 weeks.  Wants STD testing for new relationship.     HPI Patient is in today for diarrhea, stomach pain and fatigue. Diarrhea is worse since recently traveling [place] States she did not leave her resort.   She sees GI for diarrhea. Prescribed Questran but has not taken it yet.  Decreased appetite.   Working 3 jobs. Has a lot of stress.   Vaginal discharge with odor. New sexual partner. States she is concerned about her history of sexual behaviors.   Denies fever, chills, dizziness, chest pain, palpitations, shortness of breath, N/V urinary symptoms, LE edema.    Cyst on her ovaries. Needs a new OB/GYN  Health Maintenance Due  Topic Date Due   Zoster Vaccines- Shingrix (1 of 2) Never done   FOOT EXAM  11/14/2019   COVID-19 Vaccine (4 - Pfizer series) 04/21/2020   INFLUENZA VACCINE  10/05/2021    Past Medical History:  Diagnosis Date   Acute cholecystitis 07/14/2013   Acute on chronic respiratory failure with hypoxia ([place]) 09/05/2018   Anemia    has history, takes iron   Anxiety    Bipolar affective disorder ([place]) 10/26/2015   Has seen counselor, declined medications per medical record from [facility]. hypermanic   Chest pain 07/14/2013   Chronic vaginitis 10/26/2015   Has h/o vag dryness and tried Estrace per [facility] records   COVID-19 virus infection 08/24/2018   Depression    Diabetes ([place])    Patient denies   Diverticulitis    no problems   Dysfunctional uterine bleeding 03/02/2011   Elevated hemoglobin A1c 10/26/2015   GERD (gastroesophageal reflux disease)    tums prn   Headache(784.0)    Hemorrhoids    HTN (hypertension)    BP meds since fall 2016   Internal hemorrhoids    Nausea & vomiting 07/14/2013    Obesity 10/26/2015   Perimenopausal symptom 04/30/2021   Rectal bleeding 03/15/2017    Past Surgical History:  Procedure Laterality Date   CHOLECYSTECTOMY N/A 07/15/2013   Procedure: LAPAROSCOPIC CHOLECYSTECTOMY;  Surgeon: Rolm Bookbinder, MD;  Location: [facility] OR;  Service: General;  Laterality: N/A;   COLONOSCOPY     ECTOPIC PREGNANCY SURGERY     laparotomy   HEMORRHOID BANDING     ROBOTIC ASSISTED LAP VAGINAL HYSTERECTOMY      Family History  Problem Relation Age of Onset   Diabetes Mother    Hypertension Mother    Arthritis Father 20   Hypertension Brother    Cancer Brother        blood    Cancer Brother        unsure kind, as a child    CVA Other    Colon cancer Neg Hx    Colon polyps Neg Hx    Esophageal cancer Neg Hx    Rectal cancer Neg Hx    Stomach cancer Neg Hx    Pancreatic cancer Neg Hx     Social History   Socioeconomic History   Marital status: Divorced    Spouse name: Not on file   Number of children: 0   Years of education: Not on file   Highest education level: Not on file  Occupational History   Occupation: Education officer, museum, Chief of Staff  Tobacco Use   Smoking status: Former    Types: Cigarettes    Quit date: 04/18/2008    Years since quitting: 13.7   Smokeless tobacco: Never  Vaping Use   Vaping Use: Never used  Substance and Sexual Activity   Alcohol use: Yes    Comment: 1 glass of wine rarely, occasionally   Drug use: Yes    Types: Marijuana    Comment: last use this am partial blunt, Former cocaine use since 2012   Sexual activity: Yes    Partners: Male    Birth control/protection: Surgical  Other Topics Concern   Not on file  Social History Narrative   Lives alone.     No children, no pets.   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   Social Determinants of Health   Financial Resource Strain: Not on file  Food Insecurity: Not on file  Transportation Needs: Not on file  Physical Activity: Unknown  (08/31/2018)   Exercise Vital Sign    Days of Exercise per Week: Patient refused    Minutes of Exercise per Session: Patient refused  Stress: Not on file  Social Connections: Unknown (08/31/2018)   Social Connection and Isolation Panel [NHANES]    Frequency of Communication with Friends and Family: Patient refused    Frequency of Social Gatherings with Friends and Family: Patient refused    Attends Religious Services: Patient refused    Active Member of Clubs or Organizations: Patient refused    Attends Archivist Meetings: Patient refused    Marital Status: Patient refused  Intimate Partner Violence: Unknown (08/31/2018)   Humiliation, Afraid, Rape, and Kick questionnaire    Fear of Current or Ex-Partner: Patient refused    Emotionally Abused: Patient refused    Physically Abused: Patient refused    Sexually Abused: Patient refused    Outpatient Medications Prior to Visit  Medication Sig Dispense Refill   Accu-Chek Softclix Lancets lancets Use as instructed;  Check FSBS BID - Include strips # 50 with 5 refills, Lancets #50 with 5 refills DX: Type 2 DM - ICD 10: E11.9 100 each 12   albuterol (VENTOLIN HFA) 108 (90 Base) MCG/ACT inhaler Inhale 2 puffs into the lungs as needed.     Blood Glucose Monitoring Suppl (CONTOUR MONITOR) w/Device KIT 1 kit by Does not apply route 2 (two) times a day. 1 kit 0   CONTOUR NEXT TEST test strip USE 1 STRIP TO CHECK GLUCOSE TWICE DAILY 100 each 0   hyoscyamine (LEVSIN SL) 0.125 MG SL tablet Take 1 tablet, Every 6-8 hours as needed 30 tablet 0   lisinopril-hydrochlorothiazide (ZESTORETIC) 10-12.5 MG tablet Take 1 tablet by mouth daily. 90 tablet 3   mupirocin cream (BACTROBAN) 2 % Apply 1 application. topically 2 (two) times daily. 15 g 1   neomycin-colistin-hydrocortisone-thonzonium (COLY-MYCIN S) 3.05-07-08-0.5 MG/ML OTIC suspension Place 2 drops into the left ear 3 (three) times daily. 10 mL 0   NEOMYCIN-POLYMYXIN-HYDROCORTISONE (CORTISPORIN) 1  % SOLN OTIC solution neomycin-polymyxin-hydrocort 3.5 mg/mL-10,000 unit/mL-1 % ear solution  INSTILL 2 DROPS INTO THE LEFT EAR THREE TIMES DAILY     omeprazole (PRILOSEC) 40 MG capsule Take 1 capsule (40 mg total) by mouth 2 (two) times daily. Prilosec (omeprazole) 40 mg PO BID for 8 weeks. 90 capsule 3   cephALEXin (KEFLEX) 500 MG capsule Take 1 capsule (500 mg total) by mouth 3 (three) times daily. 21 capsule  0   cholestyramine (QUESTRAN) 4 g packet Take 1 packet (4 g total) by mouth daily. (Patient not taking: Reported on 01/24/2022) 30 each 3   Probiotic Product (PROBIOTIC-10 PO) Take 1 mg by mouth 1 day or 1 dose. Pt. Takes every other day (Patient not taking: Reported on 01/24/2022)     fluconazole (DIFLUCAN) 200 MG tablet Take 1 tablet (200 mg total) by mouth daily. (Patient not taking: Reported on 01/24/2022) 13 tablet 0   Vitamin D, Ergocalciferol, (DRISDOL) 1.25 MG (50000 UNIT) CAPS capsule Take 1 capsule (50,000 Units total) by mouth every 7 (seven) days. 12 capsule 0   No facility-administered medications prior to visit.    No Known Allergies  ROS     Objective:    Physical Exam Constitutional:      General: She is not in acute distress.    Appearance: She is not ill-appearing.  HENT:     Mouth/Throat:     Mouth: Mucous membranes are dry.  Eyes:     Extraocular Movements: Extraocular movements intact.     Conjunctiva/sclera: Conjunctivae normal.  Cardiovascular:     Rate and Rhythm: Normal rate and regular rhythm.  Pulmonary:     Effort: Pulmonary effort is normal.     Breath sounds: Normal breath sounds.  Abdominal:     General: Bowel sounds are increased.     Palpations: Abdomen is soft.     Tenderness: There is no abdominal tenderness. There is no guarding or rebound. Negative signs include Murphy's sign and McBurney's sign.  Skin:    General: Skin is warm and dry.  Neurological:     General: No focal deficit present.     Mental Status: She is alert and  oriented to person, place, and time.  Psychiatric:        Mood and Affect: Mood normal.        Behavior: Behavior normal.        Thought Content: Thought content normal.     BP 118/76 (BP Location: Left Arm, Patient Position: Sitting, Cuff Size: Large)   Pulse 73   Temp 97.8 F (36.6 C) (Temporal)   Ht _0  (1.676 m)   Wt 179 lb (81.2 kg)   LMP 04/25/2011   SpO2 98%   BMI 28.89 kg/m  Wt Readings from Last 3 Encounters:  01/24/22 179 lb (81.2 kg)  11/29/21 188 lb (85.3 kg)  11/16/21 185 lb (83.9 kg)        Assessment & Plan:   Problem List Items Addressed This Visit       Endocrine   Type 2 diabetes mellitus with hyperglycemia, without long-term current use of insulin ([place])    She has not been checking BS at home. Diet controlled. Check labs including Hgb A1c and follow up      Relevant Orders   CBC with Differential/Platelet (Completed)   Comprehensive metabolic panel (Completed)   Hemoglobin A1c (Completed)   Microalbumin / creatinine urine ratio (Completed)     Other   Diarrhea - Primary    No acute distress. She has been seeing GI for diarrhea. She will try prescribed Questran to see if this helps. Discussed following up with GI if no improvement.  Check labs and follow up      Relevant Orders   CBC with Differential/Platelet (Completed)   Comprehensive metabolic panel (Completed)   TSH (Completed)   T4, free (Completed)   Fatigue    Look for underlying etiology. Stress most  likely playing a role. She will schedule with a new therapist.       Relevant Orders   CBC with Differential/Platelet (Completed)   Comprehensive metabolic panel (Completed)   TSH (Completed)   T4, free (Completed)   VITAMIN D 25 Hydroxy (Vit-D Deficiency, Fractures) (Completed)   Vitamin B12 (Completed)   History of sexual behavior with high risk of exposure to communicable disease    Concerned with past hx of sexual behavior. Check labs and vaginal swab for STD testing.        Relevant Orders   Hepatitis C antibody   Hepatitis B surface antigen   HIV Antibody (routine testing w rflx)   RPR   Cervicovaginal ancillary only( [place])   Vaginal discharge    Vaginal swab (self swab) done to screen for BV, yeast and STDs.       Relevant Orders   HIV Antibody (routine testing w rflx)   RPR   Cervicovaginal ancillary only( [place])    I have discontinued [employer]. Person's Vitamin D (Ergocalciferol), cephALEXin, and fluconazole. I am also having her maintain her Contour Monitor, Contour Next Test, Probiotic Product (PROBIOTIC-10 PO), Coly-Mycin S, lisinopril-hydrochlorothiazide, Accu-Chek Softclix Lancets, mupirocin cream, NEOMYCIN-POLYMYXIN-HYDROCORTISONE, cholestyramine, hyoscyamine, albuterol, and omeprazole.  No orders of the defined types were placed in this encounter.

## 2022-01-24 NOTE — Assessment & Plan Note (Signed)
Vaginal swab (self swab) done to screen for BV, yeast and STDs.

## 2022-01-24 NOTE — Assessment & Plan Note (Signed)
She has not been checking BS at home. Diet controlled. Check labs including Hgb A1c and follow up

## 2022-01-24 NOTE — Patient Instructions (Addendum)
Please go downstairs for a urine and labs before your leave.   Start the Questran for diarrhea and follow up with GI if not improving.   Stay hydrated.   Schedule with a therapist.   We will be in touch with your results.    Obgyn Offices:   [facility] [address] [place], [place] [phone]  [facility] Address: [address] [place] [address] [place], [place] [address] Phone: [phone]  [facility] [address] [place], [place] [address] Phone: [phone]   [facility] [place], [place] [address] Phone: [phone]

## 2022-01-24 NOTE — Assessment & Plan Note (Signed)
Look for underlying etiology. Stress most likely playing a role. She will schedule with a new therapist.

## 2022-01-24 NOTE — Assessment & Plan Note (Signed)
Concerned with past hx of sexual behavior. Check labs and vaginal swab for STD testing.

## 2022-01-24 NOTE — Assessment & Plan Note (Signed)
No acute distress. She has been seeing GI for diarrhea. She will try prescribed Questran to see if this helps. Discussed following up with GI if no improvement.  Check labs and follow up

## 2022-01-25 LAB — CERVICOVAGINAL ANCILLARY ONLY
Bacterial Vaginitis (gardnerella): NEGATIVE
Candida Glabrata: NEGATIVE
Candida Vaginitis: POSITIVE — AB
Chlamydia: NEGATIVE
Comment: NEGATIVE
Comment: NEGATIVE
Comment: NEGATIVE
Comment: NEGATIVE
Comment: NEGATIVE
Comment: NORMAL
Neisseria Gonorrhea: NEGATIVE
Trichomonas: POSITIVE — AB

## 2022-01-25 LAB — HEPATITIS C ANTIBODY: Hepatitis C Ab: NONREACTIVE

## 2022-01-25 LAB — HIV ANTIBODY (ROUTINE TESTING W REFLEX): HIV 1&2 Ab, 4th Generation: NONREACTIVE

## 2022-01-25 LAB — HEPATITIS B SURFACE ANTIGEN: Hepatitis B Surface Ag: NONREACTIVE

## 2022-01-25 LAB — RPR: RPR Ser Ql: NONREACTIVE

## 2022-01-26 ENCOUNTER — Other Ambulatory Visit: Payer: Self-pay | Admitting: Family Medicine

## 2022-01-26 DIAGNOSIS — B3731 Acute candidiasis of vulva and vagina: Secondary | ICD-10-CM

## 2022-01-26 DIAGNOSIS — A599 Trichomoniasis, unspecified: Secondary | ICD-10-CM

## 2022-01-26 MED ORDER — FLUCONAZOLE 150 MG PO TABS: 150.0000 mg | ORAL_TABLET | Freq: Once | ORAL | 0 refills | Status: AC

## 2022-01-26 MED ORDER — METRONIDAZOLE 500 MG PO TABS: 500.0000 mg | ORAL_TABLET | Freq: Two times a day (BID) | ORAL | 0 refills | Status: DC

## 2022-01-26 NOTE — Progress Notes (Signed)
Please call her to make sure she sees my note: Hiba,  I sent in medication to treat trichomonas which is a sexually transmitted infection. Do not drink alcohol with the antibiotic. Make sure you avoid sex for the time you are on the medication and have your sexual partner get treated as well.  You also are positive for yeast and I sent in diflucan to treat this.  Let me know if you have any questions.

## 2022-02-05 IMAGING — MR MR HEAD W/O CM
11 series · 48 of 48 positions shown · non-contrast
Comparison: None.

CLINICAL DATA: Dizziness

EXAM:
MRI HEAD WITHOUT CONTRAST
TECHNIQUE: Multiplanar, multiecho pulse sequences of the brain and surrounding
structures were obtained without intravenous contrast.

[Series 5: DWI · axial · 3.0mm · 1.36mm/px · z∈[-63,+89]mm · 7 of 104 slices shown (1 of 4)]
[im 1/104]
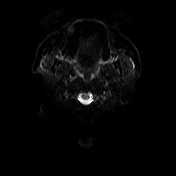
[im 18/104]
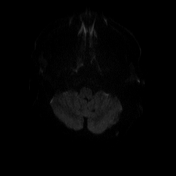
[im 35/104]
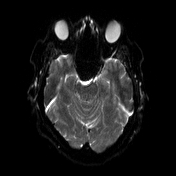
[im 52/104]
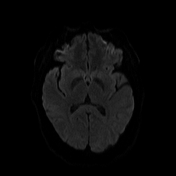
[im 69/104]
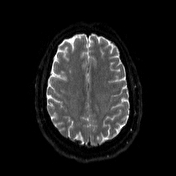
[im 86/104]
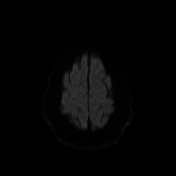
[im 104/104]
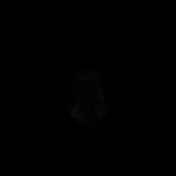

[Series 6: DWI · axial · 3.0mm · 1.36mm/px · z∈[-63,+89]mm · 3 of 52 slices shown (2 of 4)]
[im 1/52]
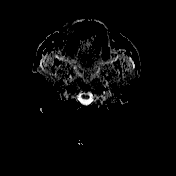
[im 26/52]
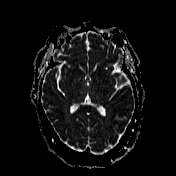
[im 52/52]
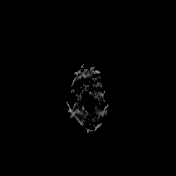

[Series 7: T1 · sagittal · 5.0mm · 0.75mm/px · 2 of 24 slices shown (1 of 2)]
[im 1/24]
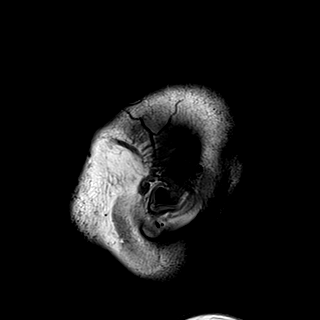
[im 24/24]
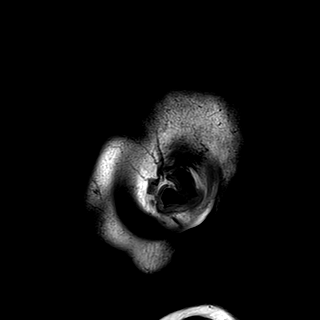

[Series 8: T2 · axial · 5.0mm · 0.62mm/px · z∈[-59,+104]mm · 2 of 26 slices shown (1 of 2)]
[im 1/26]
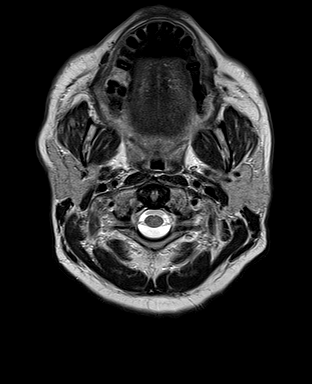
[im 26/26]
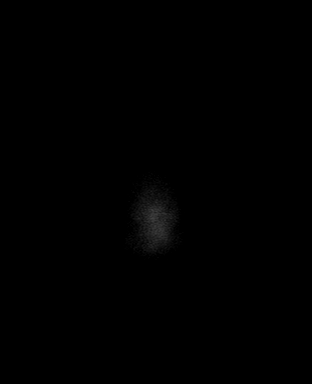

[Series 9: mip_images(sw) · axial · 24.0mm · 0.75mm/px · z∈[-43,+89]mm · 3 of 45 slices shown]
[im 1/45]
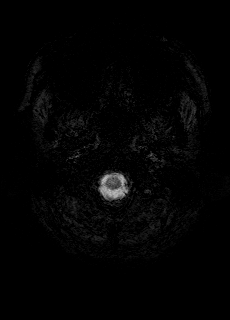
[im 23/45]
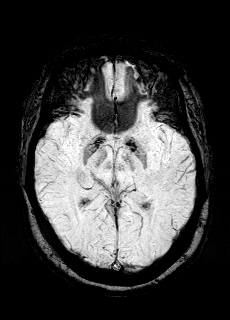
[im 45/45]
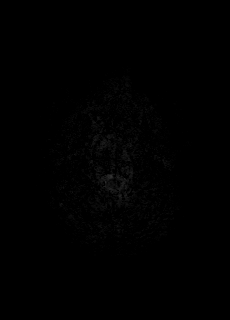

[Series 10: swi_images · axial · 3.0mm · 0.75mm/px · z∈[-54,+99]mm · 4 of 52 slices shown]
[im 1/52]
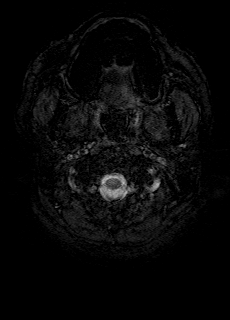
[im 18/52]
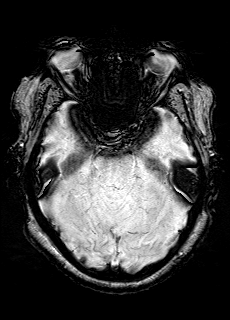
[im 35/52]
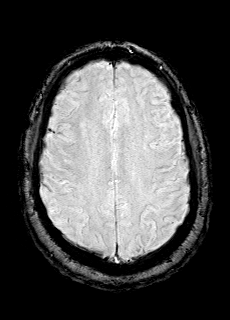
[im 52/52]
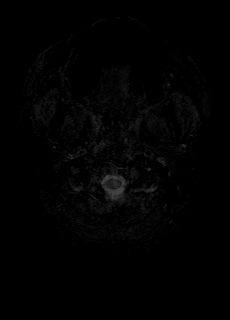

[Series 11: FLAIR · axial · 3.0mm · 0.75mm/px · z∈[-54,+99]mm · 4 of 52 slices shown]
[im 1/52]
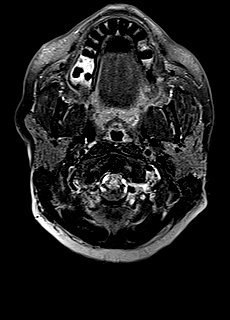
[im 18/52]
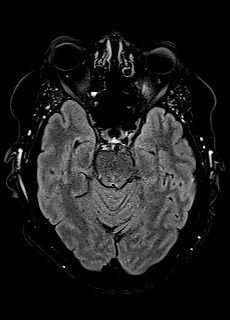
[im 35/52]
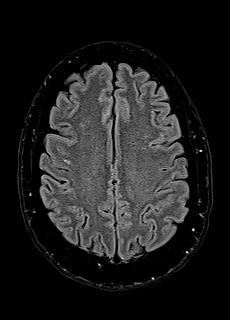
[im 52/52]
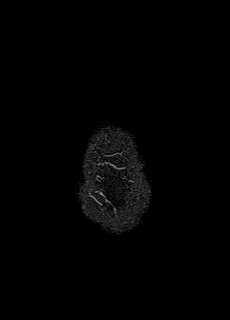

[Series 12: T1 · axial · 1.0mm · 0.94mm/px · z∈[-54,+104]mm · 12 of 160 slices shown (2 of 2)]
[im 1/160]
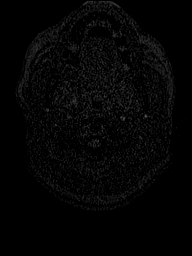
[im 15/160]
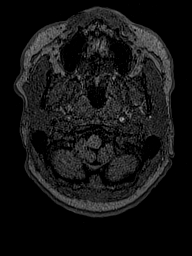
[im 29/160]
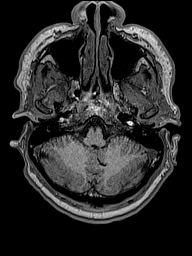
[im 44/160]
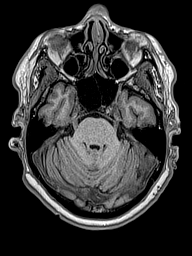
[im 58/160]
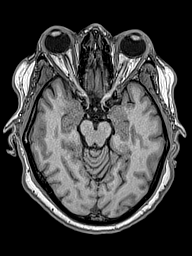
[im 73/160]
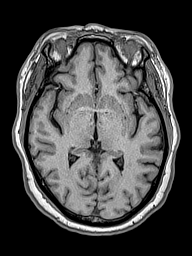
[im 87/160]
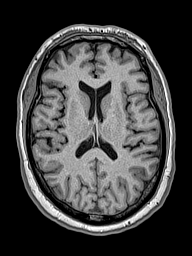
[im 102/160]
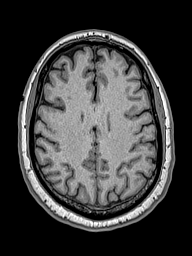
[im 116/160]
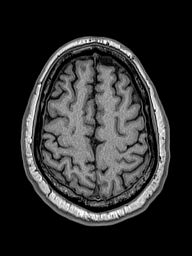
[im 131/160]
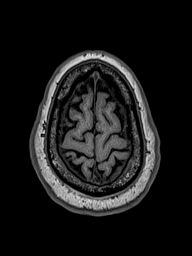
[im 145/160]
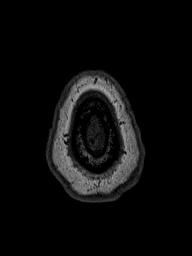
[im 160/160]
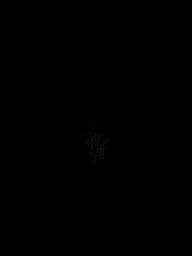

[Series 13: DWI · coronal · 5.0mm · 1.31mm/px · 5 of 68 slices shown (3 of 4)]
[im 1/68]
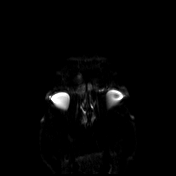
[im 17/68]
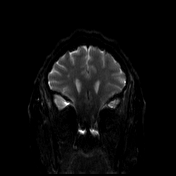
[im 34/68]
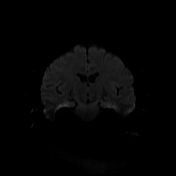
[im 51/68]
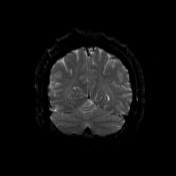
[im 68/68]
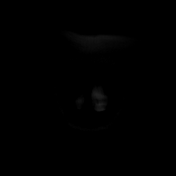

[Series 14: DWI · coronal · 5.0mm · 1.31mm/px · 3 of 34 slices shown (4 of 4)]
[im 1/34]
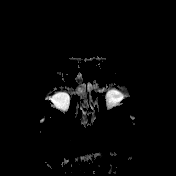
[im 17/34]
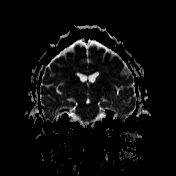
[im 34/34]
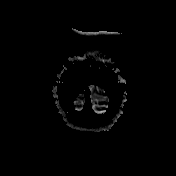

[Series 15: T2 · coronal · 5.0mm · 0.57mm/px · 3 of 34 slices shown (2 of 2)]
[im 1/34]
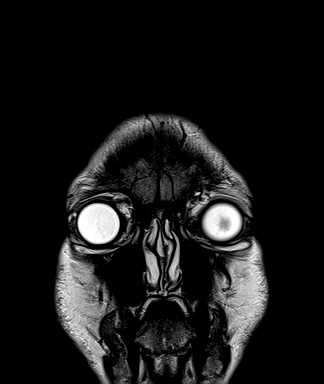
[im 17/34]
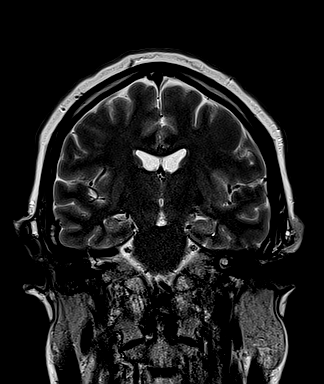
[im 34/34]
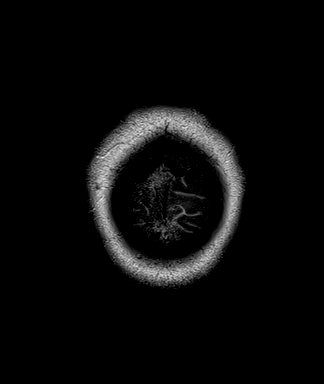

[48 of 48 positions shown; findings below may reference images not displayed]

FINDINGS: Brain: There is no acute infarction or intracranial hemorrhage.
There is no intracranial mass, mass effect, or edema. There is no
hydrocephalus or extra-axial fluid collection. Ventricles and sulci
are normal in size and configuration. Patchy foci of T2
hyperintensity in the supratentorial white matter are nonspecific
but may reflect minor chronic microvascular ischemic changes or
gliosis/demyelination of other etiologies.

Vascular: Major vessel flow voids at the skull base are preserved.

Skull and upper cervical spine: Normal marrow signal is preserved.

Sinuses/Orbits: Paranasal sinuses are aerated. Orbits are
unremarkable.

Other: Sella is unremarkable.  Mastoid air cells are clear.
IMPRESSION: No evidence of recent infarction, hemorrhage, or mass.

Few foci of nonspecific gliosis/demyelination the cerebral white
matter that may reflect minor chronic microvascular ischemic
changes.

## 2022-02-05 IMAGING — CT CT ANGIO HEAD
2 of 11 series · 7 of 33 positions shown · IV contrast (omnipaque)
Comparison: CT head 06/06/2012.  CT cervical spine 0998.

CLINICAL DATA: 54-year-old female with dizziness and nausea since
2222 hours.



[Series 10: cta head neck thins · axial · 0.43mm/px · z∈[-341,-127]mm · 5 of 644 slices shown]
[im 108/644  soft-tissue]
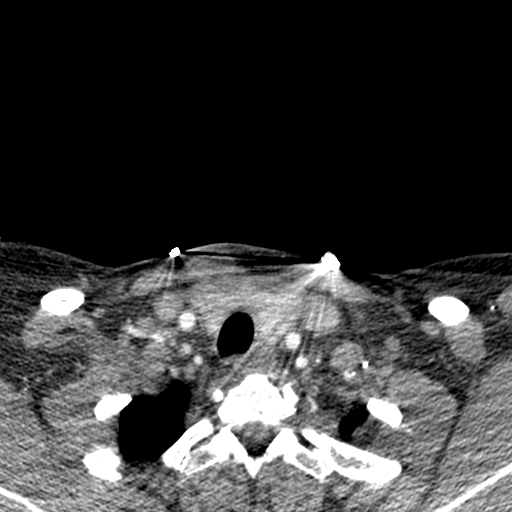
[im 215/644  bone]
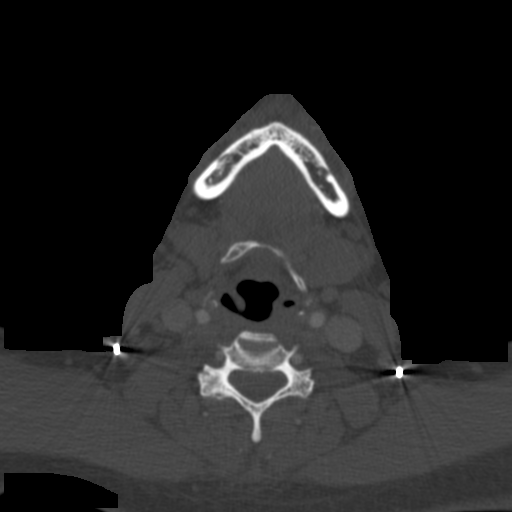
[im 322/644  soft-tissue]
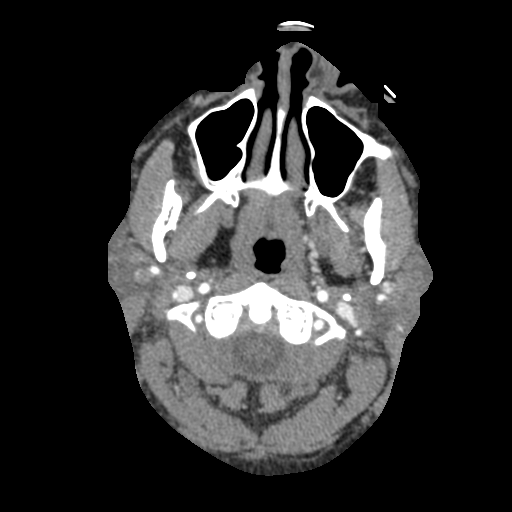
[im 429/644  bone]
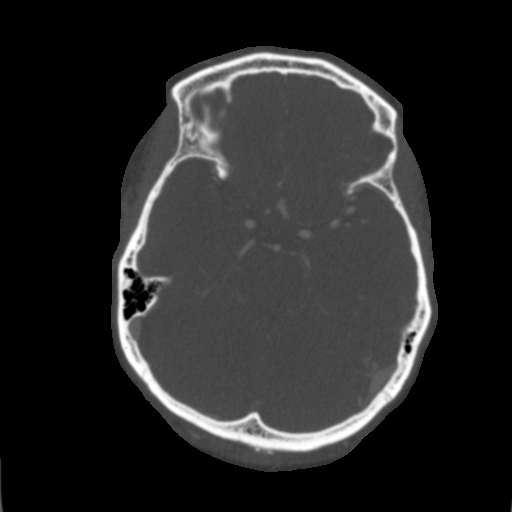
[im 536/644  soft-tissue]
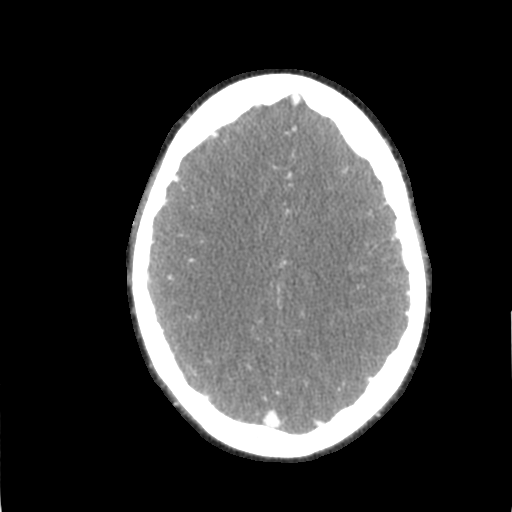

[Series 11: ax thin · axial · 0.39mm/px · z∈[-288,-180]mm · 2 of 321 slices shown]
[im 107/321  soft-tissue]
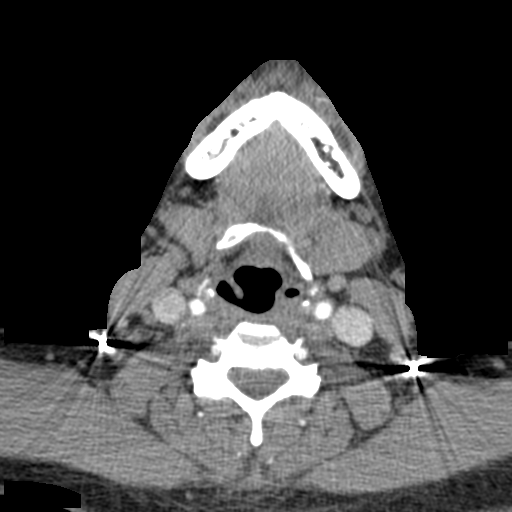
[im 214/321  soft-tissue]
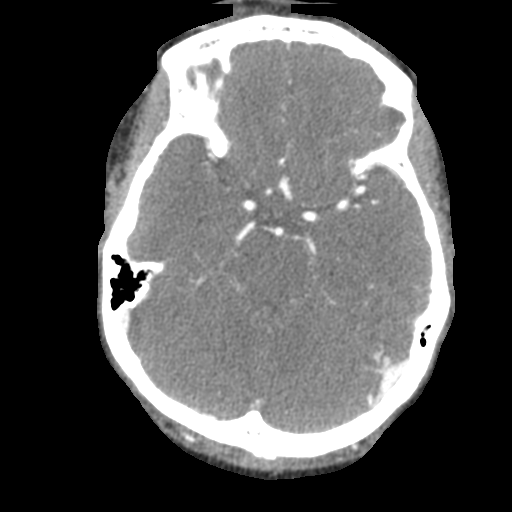

[7 of 33 positions shown; findings below may reference images not displayed]

FINDINGS: CT HEAD

Brain: Cerebral volume is stable and normal for age. Mild dystrophic
basal ganglia calcifications have increased since 7803. No midline
shift, ventriculomegaly, mass effect, evidence of mass lesion,
intracranial hemorrhage or evidence of cortically based acute
infarction. Gray-white matter differentiation is within normal
limits throughout the brain.

Calvarium and skull base: Negative.

Paranasal sinuses: Visualized paranasal sinuses and mastoids are
clear. Tympanic cavities remain clear.

Orbits: Visualized orbits and scalp soft tissues are within normal
limits.

CTA NECK

Mild to moderate metallic necklace artifact in the upper chest and
the neck.

Skeleton: Chronic facet degeneration, with development of right side
C4-C5 ankylosis since 3667. No acute osseous abnormality identified.

Upper chest: Negative.

Other neck: Negative.

Aortic arch: 3 vessel arch configuration.  No arch atherosclerosis.

Right carotid system: Negative.

Left carotid system: Negative.

Vertebral arteries:
Normal proximal artery origin. Right subclavian artery and right
vertebral the right vertebral artery is patent and normal to the
skull base.

Normal proximal left subclavian artery and left vertebral artery
origin. Mildly dominant left vertebral artery appears patent and
normal to the skull base.

CTA HEAD

Posterior circulation: Mildly dominant left V4 segment. Normal
distal vertebral arteries and vertebrobasilar junction. Normal left
PICA origin. The right AICA appears dominant. Patent basilar artery
without stenosis. Patent SCA and PCA origins. Posterior
communicating arteries are diminutive or absent. Bilateral PCA
branches are within normal limits.

Anterior circulation: Both ICA siphons are patent with no plaque or
stenosis. Small left distal ICA infundibulum (series 13, image 119),
measuring 1-2 mm likely at the origin of a left coronal artery,
visible on image 118.

Patent carotid termini. Normal MCA and ACA origins. Mild ACA A1
segment tortuosity. And the left ACA is dominant throughout.
Diminutive anterior communicating artery. Bilateral ACA branches are
within normal limits. Left MCA M1 segment and bifurcation are patent
without stenosis. Left MCA branches are within normal limits. Right
MCA M1 segment and bifurcation are patent without stenosis. Right
MCA branches are within normal limits.

Venous sinuses: Patent.

Anatomic variants: Mildly dominant left vertebral artery, left ACA.

Review of the MIP images confirms the above findings
IMPRESSION: 1. Normal for age CT appearance of the brain.
2. Negative CTA Head and Neck; small infundibulum of the distal Left
ICA suspected (normal variant).
3. Advanced chronic facet degeneration in the cervical spine. Right
side C4-C5 ankylosis has developed since [DATE].

## 2022-02-13 ENCOUNTER — Other Ambulatory Visit: Payer: Self-pay | Admitting: Medical

## 2022-02-13 ENCOUNTER — Encounter: Payer: Self-pay | Admitting: Family Medicine

## 2022-02-13 DIAGNOSIS — I10 Essential (primary) hypertension: Secondary | ICD-10-CM

## 2022-02-14 NOTE — Telephone Encounter (Signed)
This will be her 1st fill of lisinopril-hctz with you, ok to refill?

## 2022-02-15 MED ORDER — LISINOPRIL-HYDROCHLOROTHIAZIDE 10-12.5 MG PO TABS: 1.0000 | ORAL_TABLET | Freq: Every day | ORAL | 0 refills | Status: DC

## 2022-02-15 NOTE — Telephone Encounter (Signed)
Rx sent 

## 2022-03-03 ENCOUNTER — Encounter (HOSPITAL_BASED_OUTPATIENT_CLINIC_OR_DEPARTMENT_OTHER): Payer: Self-pay

## 2022-03-03 ENCOUNTER — Other Ambulatory Visit: Payer: Self-pay

## 2022-03-03 ENCOUNTER — Emergency Department (HOSPITAL_BASED_OUTPATIENT_CLINIC_OR_DEPARTMENT_OTHER)
Admission: EM | Admit: 2022-03-03 | Discharge: 2022-03-03 | Disposition: A | Discharge status: Home / Self Care | Payer: Managed Care, Other (non HMO) | Attending: Emergency Medicine | Admitting: Emergency Medicine

## 2022-03-03 DIAGNOSIS — R319 Hematuria, unspecified: Secondary | ICD-10-CM | POA: Diagnosis present

## 2022-03-03 DIAGNOSIS — N3001 Acute cystitis with hematuria: Secondary | ICD-10-CM | POA: Insufficient documentation

## 2022-03-03 LAB — URINALYSIS, ROUTINE W REFLEX MICROSCOPIC
Bilirubin Urine: NEGATIVE
Glucose, UA: NEGATIVE mg/dL
Ketones, ur: NEGATIVE mg/dL
Nitrite: NEGATIVE
Protein, ur: >300 mg/dL — AB
RBC / HPF: >50 RBC/hpf — ABNORMAL HIGH (ref 0–5)
Specific Gravity, Urine: 1.023 (ref 1.005–1.030)
WBC, UA: >50 WBC/hpf — ABNORMAL HIGH (ref 0–5)
pH: 6.5 (ref 5.0–8.0)

## 2022-03-03 MED ORDER — CEPHALEXIN 500 MG PO CAPS: 500.0000 mg | ORAL_CAPSULE | Freq: Three times a day (TID) | ORAL | 0 refills | Status: DC

## 2022-03-03 MED ORDER — CEPHALEXIN 250 MG PO CAPS
500.0000 mg | ORAL_CAPSULE | Freq: Once | ORAL | Status: AC
  Administered 2022-03-03: 500 mg via ORAL
  Filled 2022-03-03: qty 2

## 2022-03-03 NOTE — ED Provider Notes (Signed)
[facility]ERG[facility] DEPT Provider Note   CSN: [id number] Arrival date & time: 03/03/22  0149     History  Chief Complaint  Patient presents with   Hematuria    Deborah Haney is a 56 y.o. female.  The history is provided by the patient.   Patient reports she has had increasing dysuria.  She is now noting blood in her urine.  She has had mild back pain.  No fevers or vomiting. She reports this happened previously and responded to Keflex She has not taken any OTC meds. She reports she had sexual intercourse yesterday which may have triggered this episode. Patient reports this feels like previous UTI     Home Medications Prior to Admission medications   Medication Sig Start Date End Date Taking? Authorizing Provider  cephALEXin (KEFLEX) 500 MG capsule Take 1 capsule (500 mg total) by mouth 3 (three) times daily. 03/03/22  Yes Ripley Fraise, MD  Accu-Chek Softclix Lancets lancets Use as instructed;  Check FSBS BID - Include strips # 50 with 5 refills, Lancets #50 with 5 refills DX: Type 2 DM - ICD 10: E11.9 04/16/21   Francis Gaines B, PA-C  albuterol (VENTOLIN HFA) 108 (90 Base) MCG/ACT inhaler Inhale 2 puffs into the lungs as needed.    [provider]  Blood Glucose Monitoring Suppl (CONTOUR MONITOR) w/Device KIT 1 kit by Does not apply route 2 (two) times a day. 07/02/18   Henson, Vickie L, NP-C  cholestyramine (QUESTRAN) 4 g packet Take 1 packet (4 g total) by mouth daily. Patient not taking: Reported on 01/24/2022 07/07/21   Zehr, Janett Billow D, PA-C  CONTOUR NEXT TEST test strip USE 1 STRIP TO CHECK GLUCOSE TWICE DAILY 02/27/20   Henson, Vickie L, NP-C  hyoscyamine (LEVSIN SL) 0.125 MG SL tablet Take 1 tablet, Every 6-8 hours as needed 07/07/21   Zehr, Laban Emperor, PA-C  lisinopril-hydrochlorothiazide (ZESTORETIC) 10-12.5 MG tablet Take 1 tablet by mouth daily. 02/15/22   Henson, Vickie L, NP-C  mupirocin cream (BACTROBAN) 2 % Apply 1 application.  topically 2 (two) times daily. 05/13/21   Irene Pap, PA-C  neomycin-colistin-hydrocortisone-thonzonium (COLY-MYCIN S) 3.05-07-08-0.5 MG/ML OTIC suspension Place 2 drops into the left ear 3 (three) times daily. 09/28/20   Denita Lung, MD  NEOMYCIN-POLYMYXIN-HYDROCORTISONE (CORTISPORIN) 1 % SOLN OTIC solution neomycin-polymyxin-hydrocort 3.5 mg/mL-10,000 unit/mL-1 % ear solution  INSTILL 2 DROPS INTO THE LEFT EAR THREE TIMES DAILY    [provider]  omeprazole (PRILOSEC) 40 MG capsule Take 1 capsule (40 mg total) by mouth 2 (two) times daily. Prilosec (omeprazole) 40 mg PO BID for 8 weeks. 11/29/21   Sharyn Creamer, MD  Probiotic Product (PROBIOTIC-10 PO) Take 1 mg by mouth 1 day or 1 dose. Pt. Takes every other day Patient not taking: Reported on 01/24/2022    [provider]      Allergies    Patient has no known allergies.    Review of Systems   Review of Systems  Physical Exam Updated Vital Signs BP 123/81   Pulse 81   Temp 98.8 F (37.1 C) (Oral)   Resp 17   Ht 1.676 m (_0 )   Wt 81.2 kg   LMP 04/25/2011   SpO2 98%   BMI 28.89 kg/m  Physical Exam CONSTITUTIONAL: Well developed/well nourished, no distress HEAD: Normocephalic/atraumatic EYES: EOMI ABDOMEN: soft, nontender, no rebound or guarding, bowel sounds noted throughout abdomen GU:no cva tenderness NEURO: Pt is awake/alert/appropriate, moves all extremitiesx4.  No facial droop.   EXTREMITIES:  full ROM SKIN: warm, color normal PSYCH: no abnormalities of mood noted, alert and oriented to situation  ED Results / Procedures / Treatments   Labs (all labs ordered are listed, but only abnormal results are displayed) Labs Reviewed  URINALYSIS, ROUTINE W REFLEX MICROSCOPIC - Abnormal; Notable for the following components:      Result Value   Color, Urine ORANGE (*)    APPearance CLOUDY (*)    Hgb urine dipstick LARGE (*)    Protein, ur >300 (*)    Leukocytes,Ua LARGE (*)    RBC / HPF >50 (*)     WBC, UA >50 (*)    Bacteria, UA FEW (*)    Non Squamous Epithelial 0-5 (*)    All other components within normal limits    EKG None  Radiology No results found.  Procedures Procedures    Medications Ordered in ED Medications  cephALEXin (KEFLEX) capsule 500 mg (500 mg Oral Given 03/03/22 0444)    ED Course/ Medical Decision Making/ A&P                           Medical Decision Making Amount and/or Complexity of Data Reviewed Labs: ordered.  Risk Prescription drug management.   Patient reports dysuria, urinary frequency and hematuria.  Previous workup in September revealed a negative CT scan, no signs of kidney stones.  Was treated with Keflex at that time with some improvement Will place back on Keflex.  She is not septic appearing.  She is safe for outpatient management       Final Clinical Impression(s) / ED Diagnoses Final diagnoses:  Acute cystitis with hematuria    Rx / DC Orders ED Discharge Orders          Ordered    cephALEXin (KEFLEX) 500 MG capsule  3 times daily        03/03/22 0430              Ripley Fraise, MD 03/03/22 [phone]

## 2022-03-03 NOTE — ED Triage Notes (Signed)
POV from home c/o blood in urine and burning with urination that began tonight. NAD, A&O x 4, amb to triage.

## 2022-03-09 ENCOUNTER — Encounter: Payer: Self-pay | Admitting: Internal Medicine

## 2022-03-09 ENCOUNTER — Ambulatory Visit: Payer: Managed Care, Other (non HMO) | Admitting: Family Medicine

## 2022-03-09 ENCOUNTER — Encounter: Payer: Self-pay | Admitting: Family Medicine

## 2022-03-09 VITALS — BP 124/84 | HR 80 | Temp 97.6°F | Ht 66.0 in | Wt 182.0 lb

## 2022-03-09 DIAGNOSIS — R350 Frequency of micturition: Secondary | ICD-10-CM | POA: Diagnosis not present

## 2022-03-09 DIAGNOSIS — N390 Urinary tract infection, site not specified: Secondary | ICD-10-CM | POA: Diagnosis not present

## 2022-03-09 LAB — POCT URINALYSIS DIPSTICK
Bilirubin, UA: NEGATIVE
Blood, UA: NEGATIVE
Glucose, UA: POSITIVE — AB
Ketones, UA: NEGATIVE
Leukocytes, UA: NEGATIVE
Nitrite, UA: NEGATIVE
Protein, UA: NEGATIVE
Spec Grav, UA: >=1.03 — AB (ref 1.010–1.025)
Urobilinogen, UA: 0.2 E.U./dL
pH, UA: 6 (ref 5.0–8.0)

## 2022-03-09 NOTE — Progress Notes (Signed)
Subjective:     Patient ID: Deborah Haney, female    DOB: Mar 05, 1966, 57 y.o.   MRN: [id number]  Chief Complaint  Patient presents with   Urinary Frequency    Accompanied by urine odor since 12/28, concerned about reoccurring UTIs    Urinary Frequency  Associated symptoms include frequency.   Patient is in today for follow up on recent ED visit for acute cystitis with hematuria. Visit on 03/03/2022.  Completed course of keflex.   Symptoms improved. States she was told she should see a urologist due to incomplete bladder emptying and recurrent UTIs.   Denies fever, chills, dizziness, chest pain, palpitations, shortness of breath, abdominal pain, N/V/D.   Has an appt with a new therapist.   Has a new boyfriend who moved here from [place].   Health Maintenance Due  Topic Date Due   Zoster Vaccines- Shingrix (1 of 2) Never done   FOOT EXAM  11/14/2019   OPHTHALMOLOGY EXAM  02/23/2022    Past Medical History:  Diagnosis Date   Acute cholecystitis 07/14/2013   Acute on chronic respiratory failure with hypoxia ([place]) 09/05/2018   Anemia    has history, takes iron   Anxiety    Bipolar affective disorder ([place]) 10/26/2015   Has seen counselor, declined medications per medical record from [facility]. hypermanic   Chest pain 07/14/2013   Chronic vaginitis 10/26/2015   Has h/o vag dryness and tried Estrace per [facility] records   COVID-19 virus infection 08/24/2018   Depression    Diabetes ([place])    Patient denies   Diverticulitis    no problems   Dysfunctional uterine bleeding 03/02/2011   Elevated hemoglobin A1c 10/26/2015   GERD (gastroesophageal reflux disease)    tums prn   Headache(784.0)    Hemorrhoids    HTN (hypertension)    BP meds since fall 2016   Internal hemorrhoids    Nausea & vomiting 07/14/2013   Obesity 10/26/2015   Perimenopausal symptom 04/30/2021   Rectal bleeding 03/15/2017    Past Surgical History:  Procedure Laterality Date   CHOLECYSTECTOMY N/A  07/15/2013   Procedure: LAPAROSCOPIC CHOLECYSTECTOMY;  Surgeon: Rolm Bookbinder, MD;  Location: [facility] OR;  Service: General;  Laterality: N/A;   COLONOSCOPY     ECTOPIC PREGNANCY SURGERY     laparotomy   HEMORRHOID BANDING     ROBOTIC ASSISTED LAP VAGINAL HYSTERECTOMY      Family History  Problem Relation Age of Onset   Diabetes Mother    Hypertension Mother    Arthritis Father 42   Hypertension Brother    Cancer Brother        blood    Cancer Brother        unsure kind, as a child    CVA Other    Colon cancer Neg Hx    Colon polyps Neg Hx    Esophageal cancer Neg Hx    Rectal cancer Neg Hx    Stomach cancer Neg Hx    Pancreatic cancer Neg Hx     Social History   Socioeconomic History   Marital status: Divorced    Spouse name: Not on file   Number of children: 0   Years of education: Not on file   Highest education level: Not on file  Occupational History   Occupation: Education officer, museum, Chief of Staff  Tobacco Use   Smoking status: Former    Types: Cigarettes    Quit date: 04/18/2008    Years since quitting: 27.8  Smokeless tobacco: Never  Vaping Use   Vaping Use: Never used  Substance and Sexual Activity   Alcohol use: Yes    Comment: 1 glass of wine rarely, occasionally   Drug use: Yes    Types: Marijuana    Comment: last use this am partial blunt, Former cocaine use since 2012   Sexual activity: Yes    Partners: Male    Birth control/protection: Surgical  Other Topics Concern   Not on file  Social History Narrative   Lives alone.     No children, no pets.   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   Social Determinants of Health   Financial Resource Strain: Not on file  Food Insecurity: Not on file  Transportation Needs: Not on file  Physical Activity: Unknown (08/31/2018)   Exercise Vital Sign    Days of Exercise per Week: Patient refused    Minutes of Exercise per Session: Patient refused  Stress: Not on file  Social  Connections: Unknown (08/31/2018)   Social Connection and Isolation Panel [NHANES]    Frequency of Communication with Friends and Family: Patient refused    Frequency of Social Gatherings with Friends and Family: Patient refused    Attends Religious Services: Patient refused    Active Member of Clubs or Organizations: Patient refused    Attends Archivist Meetings: Patient refused    Marital Status: Patient refused  Intimate Partner Violence: Unknown (08/31/2018)   Humiliation, Afraid, Rape, and Kick questionnaire    Fear of Current or Ex-Partner: Patient refused    Emotionally Abused: Patient refused    Physically Abused: Patient refused    Sexually Abused: Patient refused    Outpatient Medications Prior to Visit  Medication Sig Dispense Refill   Accu-Chek Softclix Lancets lancets Use as instructed;  Check FSBS BID - Include strips # 50 with 5 refills, Lancets #50 with 5 refills DX: Type 2 DM - ICD 10: E11.9 100 each 12   albuterol (VENTOLIN HFA) 108 (90 Base) MCG/ACT inhaler Inhale 2 puffs into the lungs as needed.     Blood Glucose Monitoring Suppl (CONTOUR MONITOR) w/Device KIT 1 kit by Does not apply route 2 (two) times a day. 1 kit 0   cephALEXin (KEFLEX) 500 MG capsule Take 1 capsule (500 mg total) by mouth 3 (three) times daily. 21 capsule 0   cholestyramine (QUESTRAN) 4 g packet Take 1 packet (4 g total) by mouth daily. 30 each 3   CONTOUR NEXT TEST test strip USE 1 STRIP TO CHECK GLUCOSE TWICE DAILY 100 each 0   hyoscyamine (LEVSIN SL) 0.125 MG SL tablet Take 1 tablet, Every 6-8 hours as needed 30 tablet 0   lisinopril-hydrochlorothiazide (ZESTORETIC) 10-12.5 MG tablet Take 1 tablet by mouth daily. 90 tablet 0   mupirocin cream (BACTROBAN) 2 % Apply 1 application. topically 2 (two) times daily. 15 g 1   neomycin-colistin-hydrocortisone-thonzonium (COLY-MYCIN S) 3.05-07-08-0.5 MG/ML OTIC suspension Place 2 drops into the left ear 3 (three) times daily. 10 mL 0    NEOMYCIN-POLYMYXIN-HYDROCORTISONE (CORTISPORIN) 1 % SOLN OTIC solution neomycin-polymyxin-hydrocort 3.5 mg/mL-10,000 unit/mL-1 % ear solution  INSTILL 2 DROPS INTO THE LEFT EAR THREE TIMES DAILY     omeprazole (PRILOSEC) 40 MG capsule Take 1 capsule (40 mg total) by mouth 2 (two) times daily. Prilosec (omeprazole) 40 mg PO BID for 8 weeks. 90 capsule 3   Probiotic Product (PROBIOTIC-10 PO) Take 1 mg by mouth 1 day or 1 dose. Pt.  Takes every other day     No facility-administered medications prior to visit.    No Known Allergies  Review of Systems  Genitourinary:  Positive for frequency.       Objective:    Physical Exam Constitutional:      General: She is not in acute distress.    Appearance: She is not ill-appearing.  Cardiovascular:     Rate and Rhythm: Normal rate.  Pulmonary:     Effort: Pulmonary effort is normal.  Neurological:     General: No focal deficit present.     Mental Status: She is alert and oriented to person, place, and time.  Psychiatric:        Mood and Affect: Mood normal.        Behavior: Behavior normal.     BP 124/84 (BP Location: Left Arm, Patient Position: Sitting, Cuff Size: Large)   Pulse 80   Temp 97.6 F (36.4 C) (Temporal)   Ht _0  (1.676 m)   Wt 182 lb (82.6 kg)   LMP 04/25/2011   SpO2 100%   BMI 29.38 kg/m  Wt Readings from Last 3 Encounters:  03/09/22 182 lb (82.6 kg)  03/03/22 179 lb 0.2 oz (81.2 kg)  01/24/22 179 lb (81.2 kg)       Assessment & Plan:   Problem List Items Addressed This Visit   None Visit Diagnoses     Recurrent UTI (urinary tract infection)    -  Primary   Relevant Orders   Ambulatory referral to Urology   Urine Culture   Urinary frequency       Relevant Orders   POCT urinalysis dipstick (Completed)   Urine Culture      Urinalysis dipstick negative for infection. Urine culture sent.  Increase water intake.  Referral to urologist.  She will call and schedule with an OB/GYN.    I am having  Deborah Haney maintain her Contour Monitor, Contour Next Test, Probiotic Product (PROBIOTIC-10 PO), Coly-Mycin S, Accu-Chek Softclix Lancets, mupirocin cream, NEOMYCIN-POLYMYXIN-HYDROCORTISONE, cholestyramine, hyoscyamine, albuterol, omeprazole, lisinopril-hydrochlorothiazide, and cephALEXin.  No orders of the defined types were placed in this encounter.

## 2022-03-09 NOTE — Patient Instructions (Signed)
You will hear from [facility].   Make sure you are staying hydrated.   Call and schedule with a gynecologist as discussed.   Obgyn Offices:   [facility] [address] [place], [place] [phone]  [facility] Address: [address] [place], [place] [address] Phone: [phone]  [place] [address] [place], [place] [address] Phone: [phone]   [place], [place] [address] Phone: [phone]

## 2022-03-10 LAB — URINE CULTURE: Result:: NO GROWTH

## 2022-03-30 ENCOUNTER — Other Ambulatory Visit: Payer: Self-pay

## 2022-03-30 MED ORDER — CHOLESTYRAMINE 4 G PO PACK: 4.0000 g | PACK | Freq: Every day | ORAL | 0 refills | Status: DC

## 2022-03-30 NOTE — Progress Notes (Signed)
Refill request for Cholestyramin packets 90 day supply - [facility]

## 2022-04-12 ENCOUNTER — Telehealth: Payer: Managed Care, Other (non HMO) | Admitting: Physician Assistant

## 2022-04-12 DIAGNOSIS — J209 Acute bronchitis, unspecified: Secondary | ICD-10-CM

## 2022-04-12 MED ORDER — BENZONATATE 100 MG PO CAPS: 100.0000 mg | ORAL_CAPSULE | Freq: Three times a day (TID) | ORAL | 0 refills | Status: DC | PRN

## 2022-04-12 MED ORDER — AZITHROMYCIN 250 MG PO TABS: ORAL_TABLET | ORAL | 0 refills | Status: AC

## 2022-04-12 MED ORDER — ALBUTEROL SULFATE HFA 108 (90 BASE) MCG/ACT IN AERS: 2.0000 | INHALATION_SPRAY | Freq: Four times a day (QID) | RESPIRATORY_TRACT | 0 refills | Status: DC | PRN

## 2022-04-12 NOTE — Progress Notes (Signed)
Virtual Visit Consent   Deborah Haney, you are scheduled for a virtual visit with a [facility] provider today. Just as with appointments in the office, your consent must be obtained to participate. Your consent will be active for this visit and any virtual visit you may have with one of our providers in the next 365 days. If you have a MyChart account, a copy of this consent can be sent to you electronically.  As this is a virtual visit, video technology does not allow for your provider to perform a traditional examination. This may limit your provider's ability to fully assess your condition. If your provider identifies any concerns that need to be evaluated in person or the need to arrange testing (such as labs, EKG, etc.), we will make arrangements to do so. Although advances in technology are sophisticated, we cannot ensure that it will always work on either your end or our end. If the connection with a video visit is poor, the visit may have to be switched to a telephone visit. With either a video or telephone visit, we are not always able to ensure that we have a secure connection.  By engaging in this virtual visit, you consent to the provision of healthcare and authorize for your insurance to be billed (if applicable) for the services provided during this visit. Depending on your insurance coverage, you may receive a charge related to this service.  I need to obtain your verbal consent now. Are you willing to proceed with your visit today? Deborah Haney has provided verbal consent on 04/12/2022 for a virtual visit (video or telephone). Leeanne Rio, [place]  Date: 04/12/2022 3:29 PM  Virtual Visit via Video Note   I, Leeanne Rio, connected with  Deborah Haney  ([id number], [birth year]) on 04/12/22 at  3:15 PM EST by a video-enabled telemedicine application and verified that I am speaking with the correct person using two identifiers.  Location: Patient: Virtual Visit Location  Patient: Home Provider: Virtual Visit Location Provider: Home Office   I discussed the limitations of evaluation and management by telemedicine and the availability of in person appointments. The patient expressed understanding and agreed to proceed.    History of Present Illness: Deborah Haney is a 57 y.o. who identifies as a female who was assigned female at birth, and is being seen today for URI symptoms starting last Friday night with some fatigue and dry, persistent cough. The next day noting some associated chest and nasal congestion. Cough is still dry but very persistent and causing some chest wall tenderness. Unsure of any fever. Home COVID test which was negative. Today with more wheezing.  OTC -- Alka Seltzer Cold and Theraflu.  HPI: HPI  Problems:  Patient Active Problem List   Diagnosis Date Noted   Diarrhea 01/24/2022   History of sexual behavior with high risk of exposure to communicable disease 01/24/2022   Vaginal discharge 01/24/2022   Anemia 11/16/2021   Diverticulitis 11/16/2021   Gastroesophageal reflux disease 11/16/2021   Abscess 11/16/2021   Fatigue 11/16/2021   Bloating 07/07/2021   Rectal bleeding 07/07/2021   Irritable bowel syndrome 07/07/2021   H/O: hysterectomy 05/13/2021   Menopausal symptoms 05/13/2021   Elevated blood sugar 12/17/2020   Screening for heart disease 12/17/2020   Type 2 diabetes mellitus with hyperglycemia, without long-term current use of insulin ([place]) 12/17/2020   Mixed dyslipidemia 12/17/2020   Situational depression 04/24/2020   Vitamin D deficiency 04/24/2020   Burning  with urination 09/25/2019   Frequent urination 09/25/2019   Gross hematuria 09/25/2019   Internal bleeding hemorrhoids 08/30/2019   Change in bowel habit 08/30/2019   Loose stools 08/30/2019   Paresthesia of both lower extremities 11/14/2018   Elevated LDL cholesterol level 10/17/2018   Proteinuria 10/17/2018   History of 2019 novel coronavirus disease  (COVID-19) 09/24/2018   History of pneumonia 09/24/2018   Controlled type 2 diabetes mellitus without complication, without long-term current use of insulin ([place]) 09/11/2018   Acute cough 06/12/2018   Allergic rhinitis due to pollen 06/12/2018   Acute cystitis with hematuria 03/05/2018   Dyslipidemia 06/05/2017   Essential hypertension 10/26/2015   Obesity (BMI 30.0-34.9) 10/26/2015   Chronic vaginitis 10/26/2015   Bipolar affective disorder ([place]) 10/26/2015   Chest pain 07/14/2013   Dysfunctional uterine bleeding 03/02/2011    Allergies: No Known Allergies Medications:  Current Outpatient Medications:    albuterol (VENTOLIN HFA) 108 (90 Base) MCG/ACT inhaler, Inhale 2 puffs into the lungs every 6 (six) hours as needed for wheezing or shortness of breath., Disp: 8 g, Rfl: 0   azithromycin (ZITHROMAX) 250 MG tablet, Take 2 tablets on day 1, then 1 tablet daily on days 2 through 5, Disp: 6 tablet, Rfl: 0   benzonatate (TESSALON) 100 MG capsule, Take 1 capsule (100 mg total) by mouth 3 (three) times daily as needed for cough., Disp: 30 capsule, Rfl: 0   Accu-Chek Softclix Lancets lancets, Use as instructed;  Check FSBS BID - Include strips # 50 with 5 refills, Lancets #50 with 5 refills DX: Type 2 DM - ICD 10: E11.9, Disp: 100 each, Rfl: 12   Blood Glucose Monitoring Suppl (CONTOUR MONITOR) w/Device KIT, 1 kit by Does not apply route 2 (two) times a day., Disp: 1 kit, Rfl: 0   cholestyramine (QUESTRAN) 4 g packet, Take 1 packet (4 g total) by mouth daily., Disp: 90 packet, Rfl: 0   CONTOUR NEXT TEST test strip, USE 1 STRIP TO CHECK GLUCOSE TWICE DAILY, Disp: 100 each, Rfl: 0   hyoscyamine (LEVSIN SL) 0.125 MG SL tablet, Take 1 tablet, Every 6-8 hours as needed, Disp: 30 tablet, Rfl: 0   lisinopril-hydrochlorothiazide (ZESTORETIC) 10-12.5 MG tablet, Take 1 tablet by mouth daily., Disp: 90 tablet, Rfl: 0   mupirocin cream (BACTROBAN) 2 %, Apply 1 application. topically 2 (two) times daily.,  Disp: 15 g, Rfl: 1   neomycin-colistin-hydrocortisone-thonzonium (COLY-MYCIN S) 3.05-07-08-0.5 MG/ML OTIC suspension, Place 2 drops into the left ear 3 (three) times daily., Disp: 10 mL, Rfl: 0   NEOMYCIN-POLYMYXIN-HYDROCORTISONE (CORTISPORIN) 1 % SOLN OTIC solution, neomycin-polymyxin-hydrocort 3.5 mg/mL-10,000 unit/mL-1 % ear solution  INSTILL 2 DROPS INTO THE LEFT EAR THREE TIMES DAILY, Disp: , Rfl:    omeprazole (PRILOSEC) 40 MG capsule, Take 1 capsule (40 mg total) by mouth 2 (two) times daily. Prilosec (omeprazole) 40 mg PO BID for 8 weeks., Disp: 90 capsule, Rfl: 3   Probiotic Product (PROBIOTIC-10 PO), Take 1 mg by mouth 1 day or 1 dose. Pt. Takes every other day, Disp: , Rfl:   Observations/Objective: Patient is well-developed, well-nourished in no acute distress.  Resting comfortably at home.  Head is normocephalic, atraumatic.  No labored breathing. Speech is clear and coherent with logical content.  Patient is alert and oriented at baseline.   Assessment and Plan: 1. Acute bronchitis, unspecified organism - benzonatate (TESSALON) 100 MG capsule; Take 1 capsule (100 mg total) by mouth 3 (three) times daily as needed for cough.  Dispense: 30  capsule; Refill: 0 - albuterol (VENTOLIN HFA) 108 (90 Base) MCG/ACT inhaler; Inhale 2 puffs into the lungs every 6 (six) hours as needed for wheezing or shortness of breath.  Dispense: 8 g; Refill: 0  Suspect bronchitis, most likely viral in nature. Supportive measures and OTC medications reviewed. Tessalon and Albuterol per orders. Did place antibiotic on file at pharmacy to start if symptoms continue to progress after 48 hours or any further change in sputum.  Follow Up Instructions: I discussed the assessment and treatment plan with the patient. The patient was provided an opportunity to ask questions and all were answered. The patient agreed with the plan and demonstrated an understanding of the instructions.  A copy of instructions were sent to  the patient via MyChart unless otherwise noted below.   The patient was advised to call back or seek an in-person evaluation if the symptoms worsen or if the condition fails to improve as anticipated.  Time:  I spent 10 minutes with the patient via telehealth technology discussing the above problems/concerns.    Leeanne Rio, PA-C

## 2022-04-12 NOTE — Patient Instructions (Signed)
Deborah Haney, thank you for joining Leeanne Rio, PA-C for today's virtual visit.  While this provider is not your primary care provider (PCP), if your PCP is located in our provider database this encounter information will be shared with them immediately following your visit.   [facility] account gives you access to today's visit and all your visits, tests, and labs performed at [facility] " click here if you don't have a [facility] account or go to [email or web address]  Consent: (Patient) Deborah Haney provided verbal consent for this virtual visit at the beginning of the encounter.  Current Medications:  Current Outpatient Medications:    Accu-Chek Softclix Lancets lancets, Use as instructed;  Check FSBS BID - Include strips # 50 with 5 refills, Lancets #50 with 5 refills DX: Type 2 DM - ICD 10: E11.9, Disp: 100 each, Rfl: 12   albuterol (VENTOLIN HFA) 108 (90 Base) MCG/ACT inhaler, Inhale 2 puffs into the lungs as needed., Disp: , Rfl:    Blood Glucose Monitoring Suppl (CONTOUR MONITOR) w/Device KIT, 1 kit by Does not apply route 2 (two) times a day., Disp: 1 kit, Rfl: 0   cephALEXin (KEFLEX) 500 MG capsule, Take 1 capsule (500 mg total) by mouth 3 (three) times daily., Disp: 21 capsule, Rfl: 0   cholestyramine (QUESTRAN) 4 g packet, Take 1 packet (4 g total) by mouth daily., Disp: 90 packet, Rfl: 0   CONTOUR NEXT TEST test strip, USE 1 STRIP TO CHECK GLUCOSE TWICE DAILY, Disp: 100 each, Rfl: 0   hyoscyamine (LEVSIN SL) 0.125 MG SL tablet, Take 1 tablet, Every 6-8 hours as needed, Disp: 30 tablet, Rfl: 0   lisinopril-hydrochlorothiazide (ZESTORETIC) 10-12.5 MG tablet, Take 1 tablet by mouth daily., Disp: 90 tablet, Rfl: 0   mupirocin cream (BACTROBAN) 2 %, Apply 1 application. topically 2 (two) times daily., Disp: 15 g, Rfl: 1   neomycin-colistin-hydrocortisone-thonzonium (COLY-MYCIN S) 3.05-07-08-0.5 MG/ML OTIC suspension, Place 2 drops into  the left ear 3 (three) times daily., Disp: 10 mL, Rfl: 0   NEOMYCIN-POLYMYXIN-HYDROCORTISONE (CORTISPORIN) 1 % SOLN OTIC solution, neomycin-polymyxin-hydrocort 3.5 mg/mL-10,000 unit/mL-1 % ear solution  INSTILL 2 DROPS INTO THE LEFT EAR THREE TIMES DAILY, Disp: , Rfl:    omeprazole (PRILOSEC) 40 MG capsule, Take 1 capsule (40 mg total) by mouth 2 (two) times daily. Prilosec (omeprazole) 40 mg PO BID for 8 weeks., Disp: 90 capsule, Rfl: 3   Probiotic Product (PROBIOTIC-10 PO), Take 1 mg by mouth 1 day or 1 dose. Pt. Takes every other day, Disp: , Rfl:    Medications ordered in this encounter:  No orders of the defined types were placed in this encounter.    *If you need refills on other medications prior to your next appointment, please contact your pharmacy*  Follow-Up: Call back or seek an in-person evaluation if the symptoms worsen or if the condition fails to improve as anticipated.  [place] [phone]  Other Instructions   If you have been instructed to have an in-person evaluation today at a local Urgent Care facility, please use the link below. It will take you to a list of all of our available [facility], including address, phone number and hours of operation. Please do not delay care.  [facility]  If you or a family member do not have a primary care provider, use the link below to schedule a visit and establish care. When you choose a [facility] primary care physician or advanced practice provider, you gain a long-term partner in health. Find a Primary Care Provider  Learn more about [facility]'s in-office and virtual care options: [facility]

## 2022-06-06 DIAGNOSIS — F439 Reaction to severe stress, unspecified: Secondary | ICD-10-CM | POA: Diagnosis not present

## 2022-06-09 DIAGNOSIS — N83201 Unspecified ovarian cyst, right side: Secondary | ICD-10-CM | POA: Diagnosis not present

## 2022-06-10 ENCOUNTER — Other Ambulatory Visit: Payer: Self-pay | Admitting: Gastroenterology

## 2022-06-10 DIAGNOSIS — N3021 Other chronic cystitis with hematuria: Secondary | ICD-10-CM | POA: Diagnosis not present

## 2022-06-10 DIAGNOSIS — R310 Gross hematuria: Secondary | ICD-10-CM | POA: Diagnosis not present

## 2022-06-10 DIAGNOSIS — N3946 Mixed incontinence: Secondary | ICD-10-CM | POA: Diagnosis not present

## 2022-06-15 DIAGNOSIS — M62838 Other muscle spasm: Secondary | ICD-10-CM | POA: Diagnosis not present

## 2022-06-15 DIAGNOSIS — N3946 Mixed incontinence: Secondary | ICD-10-CM | POA: Diagnosis not present

## 2022-06-15 DIAGNOSIS — M6281 Muscle weakness (generalized): Secondary | ICD-10-CM | POA: Diagnosis not present

## 2022-06-15 DIAGNOSIS — M6289 Other specified disorders of muscle: Secondary | ICD-10-CM | POA: Diagnosis not present

## 2022-06-16 DIAGNOSIS — F439 Reaction to severe stress, unspecified: Secondary | ICD-10-CM | POA: Diagnosis not present

## 2022-06-23 DIAGNOSIS — F4322 Adjustment disorder with anxiety: Secondary | ICD-10-CM | POA: Diagnosis not present

## 2022-06-30 DIAGNOSIS — F439 Reaction to severe stress, unspecified: Secondary | ICD-10-CM | POA: Diagnosis not present

## 2022-07-08 DIAGNOSIS — F439 Reaction to severe stress, unspecified: Secondary | ICD-10-CM | POA: Diagnosis not present

## 2022-07-21 DIAGNOSIS — N83201 Unspecified ovarian cyst, right side: Secondary | ICD-10-CM | POA: Diagnosis not present

## 2022-07-27 DIAGNOSIS — F439 Reaction to severe stress, unspecified: Secondary | ICD-10-CM | POA: Diagnosis not present

## 2022-08-03 DIAGNOSIS — N3021 Other chronic cystitis with hematuria: Secondary | ICD-10-CM | POA: Diagnosis not present

## 2022-08-03 DIAGNOSIS — N3946 Mixed incontinence: Secondary | ICD-10-CM | POA: Diagnosis not present

## 2022-08-04 DIAGNOSIS — N3946 Mixed incontinence: Secondary | ICD-10-CM | POA: Diagnosis not present

## 2022-08-04 DIAGNOSIS — M6281 Muscle weakness (generalized): Secondary | ICD-10-CM | POA: Diagnosis not present

## 2022-08-04 DIAGNOSIS — M62838 Other muscle spasm: Secondary | ICD-10-CM | POA: Diagnosis not present

## 2022-08-04 DIAGNOSIS — M6289 Other specified disorders of muscle: Secondary | ICD-10-CM | POA: Diagnosis not present

## 2022-08-07 ENCOUNTER — Ambulatory Visit (HOSPITAL_COMMUNITY)
Admission: EM | Admit: 2022-08-07 | Discharge: 2022-08-07 | Disposition: A | Discharge status: Home / Self Care | Payer: BC Managed Care – PPO | Attending: Internal Medicine | Admitting: Internal Medicine

## 2022-08-07 ENCOUNTER — Encounter (HOSPITAL_COMMUNITY): Payer: Self-pay

## 2022-08-07 DIAGNOSIS — B349 Viral infection, unspecified: Secondary | ICD-10-CM | POA: Diagnosis not present

## 2022-08-07 DIAGNOSIS — R350 Frequency of micturition: Secondary | ICD-10-CM | POA: Diagnosis not present

## 2022-08-07 DIAGNOSIS — Z1152 Encounter for screening for COVID-19: Secondary | ICD-10-CM | POA: Diagnosis not present

## 2022-08-07 DIAGNOSIS — R52 Pain, unspecified: Secondary | ICD-10-CM | POA: Diagnosis not present

## 2022-08-07 DIAGNOSIS — R6883 Chills (without fever): Secondary | ICD-10-CM | POA: Diagnosis not present

## 2022-08-07 DIAGNOSIS — N3001 Acute cystitis with hematuria: Secondary | ICD-10-CM | POA: Diagnosis not present

## 2022-08-07 LAB — POCT URINALYSIS DIP (MANUAL ENTRY)
Bilirubin, UA: NEGATIVE
Glucose, UA: NEGATIVE mg/dL
Leukocytes, UA: NEGATIVE
Nitrite, UA: NEGATIVE
Protein Ur, POC: NEGATIVE mg/dL
Spec Grav, UA: 1.025 (ref 1.010–1.025)
Urobilinogen, UA: 0.2 E.U./dL
pH, UA: 6 (ref 5.0–8.0)

## 2022-08-07 LAB — POCT INFLUENZA A/B
Influenza A, POC: NEGATIVE
Influenza B, POC: NEGATIVE

## 2022-08-07 MED ORDER — FLUTICASONE PROPIONATE 50 MCG/ACT NA SUSP: 1.0000 | Freq: Every day | NASAL | 0 refills | Status: DC

## 2022-08-07 NOTE — Discharge Instructions (Addendum)
I believe that you have a virus.  You tested negative for flu.  We will contact you if you are positive for COVID.  Use Tylenol and ibuprofen for pain.  You can use Flonase for congestion.  I also recommend nasal saline and sinus rinses as well as a humidifier in your room at night.  Your urine did not show any evidence of infection.  We are sending this for culture and we will contact you if need to start antibiotics.  If your symptoms are improving within a few days please return for reevaluation.  If you have any worsening symptoms including severe pain, fever not responding to medication, nausea/vomiting interfering with oral intake, chest pain, shortness of breath, bad cough you should be seen immediately.

## 2022-08-07 NOTE — ED Provider Notes (Signed)
[facility]    CSN: [id number] Arrival date & time: 08/07/22  1456      History   Chief Complaint Chief Complaint  Patient presents with   Generalized Body Aches   Chills   Nasal Congestion   Urinary Frequency   Dysuria    HPI Deborah Haney is a 57 y.o. female.   Patient presents today with a 2-day history of URI symptoms.  She reports associated body aches, chills, nasal congestion, urinary frequency.  Denies any dysuria, abdominal pain, nausea, vomiting, fever, cough, shortness of breath, chest pain.  Denies any known sick contacts.  She has been working a lot (80 hours last week) and wonders if this could have run her down and let her to get sick.  She does have a history of recurrent UTI and has been seeing a urologist.  Denies any recent antibiotics or steroids.  She has had COVID in 2019.  Has not had it more recently.  She has had COVID-19 vaccination.  Denies history of allergies, asthma, COPD.  She does not smoke.  She has been taking Alka-Seltzer plus without improvement of symptoms.  She is having difficulty with her daily activities as a result of symptoms.    Past Medical History:  Diagnosis Date   Acute cholecystitis 07/14/2013   Acute on chronic respiratory failure with hypoxia (HCC) 09/05/2018   Anemia    has history, takes iron   Anxiety    Bipolar affective disorder (HCC) 10/26/2015   Has seen counselor, declined medications per medical record from [facility]. hypermanic   Chest pain 07/14/2013   Chronic vaginitis 10/26/2015   Has h/o vag dryness and tried Estrace per [facility] records   COVID-19 virus infection 08/24/2018   Depression    Diabetes (HCC)    Patient denies   Diverticulitis    no problems   Dysfunctional uterine bleeding 03/02/2011   Elevated hemoglobin A1c 10/26/2015   GERD (gastroesophageal reflux disease)    tums prn   Headache(784.0)    Hemorrhoids    HTN (hypertension)    BP meds since fall 2016   Internal hemorrhoids     Nausea & vomiting 07/14/2013   Obesity 10/26/2015   Perimenopausal symptom 04/30/2021   Rectal bleeding 03/15/2017    Patient Active Problem List   Diagnosis Date Noted   Diarrhea 01/24/2022   History of sexual behavior with high risk of exposure to communicable disease 01/24/2022   Vaginal discharge 01/24/2022   Anemia 11/16/2021   Diverticulitis 11/16/2021   Gastroesophageal reflux disease 11/16/2021   Abscess 11/16/2021   Fatigue 11/16/2021   Bloating 07/07/2021   Rectal bleeding 07/07/2021   Irritable bowel syndrome 07/07/2021   H/O: hysterectomy 05/13/2021   Menopausal symptoms 05/13/2021   Elevated blood sugar 12/17/2020   Screening for heart disease 12/17/2020   Type 2 diabetes mellitus with hyperglycemia, without long-term current use of insulin (HCC) 12/17/2020   Mixed dyslipidemia 12/17/2020   Situational depression 04/24/2020   Vitamin D deficiency 04/24/2020   Burning with urination 09/25/2019   Frequent urination 09/25/2019   Gross hematuria 09/25/2019   Internal bleeding hemorrhoids 08/30/2019   Change in bowel habit 08/30/2019   Loose stools 08/30/2019   Paresthesia of both lower extremities 11/14/2018   Elevated LDL cholesterol level 10/17/2018   Proteinuria 10/17/2018   History of 2019 novel coronavirus disease (COVID-19) 09/24/2018   History of pneumonia 09/24/2018   Controlled type 2 diabetes mellitus without complication, without long-term current use of insulin (  HCC) 09/11/2018   Acute cough 06/12/2018   Allergic rhinitis due to pollen 06/12/2018   Acute cystitis with hematuria 03/05/2018   Dyslipidemia 06/05/2017   Essential hypertension 10/26/2015   Obesity (BMI 30.0-34.9) 10/26/2015   Chronic vaginitis 10/26/2015   Bipolar affective disorder (HCC) 10/26/2015   Chest pain 07/14/2013   Dysfunctional uterine bleeding 03/02/2011    Past Surgical History:  Procedure Laterality Date   CHOLECYSTECTOMY N/A 07/15/2013   Procedure: LAPAROSCOPIC  CHOLECYSTECTOMY;  Surgeon: Emelia Loron, MD;  Location: [facility] OR;  Service: General;  Laterality: N/A;   COLONOSCOPY     ECTOPIC PREGNANCY SURGERY     laparotomy   HEMORRHOID BANDING     ROBOTIC ASSISTED LAP VAGINAL HYSTERECTOMY      OB History     Gravida  1   Para  0   Term  0   Preterm  0   AB  1   Living  0      SAB  0   IAB  0   Ectopic  1   Multiple  0   Live Births               Home Medications    Prior to Admission medications   Medication Sig Start Date End Date Taking? Authorizing Provider  fluticasone (FLONASE) 50 MCG/ACT nasal spray Place 1 spray into both nostrils daily. 08/07/22  Yes Anayelli Lai K, PA-C  Accu-Chek Softclix Lancets lancets Use as instructed;  Check FSBS BID - Include strips # 50 with 5 refills, Lancets #50 with 5 refills DX: Type 2 DM - ICD 10: E11.9 04/16/21   Jake Shark, PA-C  Blood Glucose Monitoring Suppl (CONTOUR MONITOR) w/Device KIT 1 kit by Does not apply route 2 (two) times a day. 07/02/18   Henson, Vickie L, NP-C  cholestyramine (QUESTRAN) 4 g packet MIX AND DRINK 1 PACKET DAILY 06/10/22   Zehr, Shanda Bumps D, PA-C  CONTOUR NEXT TEST test strip USE 1 STRIP TO CHECK GLUCOSE TWICE DAILY 02/27/20   Henson, Vickie L, NP-C  lisinopril-hydrochlorothiazide (ZESTORETIC) 10-12.5 MG tablet Take 1 tablet by mouth daily. 02/15/22   Henson, Vickie L, NP-C  mupirocin cream (BACTROBAN) 2 % Apply 1 application. topically 2 (two) times daily. 05/13/21   Jake Shark, PA-C  neomycin-colistin-hydrocortisone-thonzonium (COLY-MYCIN S) 3.05-07-08-0.5 MG/ML OTIC suspension Place 2 drops into the left ear 3 (three) times daily. 09/28/20   Ronnald Nian, MD  NEOMYCIN-POLYMYXIN-HYDROCORTISONE (CORTISPORIN) 1 % SOLN OTIC solution neomycin-polymyxin-hydrocort 3.5 mg/mL-10,000 unit/mL-1 % ear solution  INSTILL 2 DROPS INTO THE LEFT EAR THREE TIMES DAILY    [provider]  omeprazole (PRILOSEC) 40 MG capsule Take 1 capsule (40 mg total) by  mouth 2 (two) times daily. Prilosec (omeprazole) 40 mg PO BID for 8 weeks. 11/29/21   Imogene Burn, MD  Probiotic Product (PROBIOTIC-10 PO) Take 1 mg by mouth 1 day or 1 dose. Pt. Takes every other day    [provider]    Family History Family History  Problem Relation Age of Onset   Diabetes Mother    Hypertension Mother    Arthritis Father 26   Hypertension Brother    Cancer Brother        blood    Cancer Brother        unsure kind, as a child    CVA Other    Colon cancer Neg Hx    Colon polyps Neg Hx    Esophageal cancer Neg Hx  Rectal cancer Neg Hx    Stomach cancer Neg Hx    Pancreatic cancer Neg Hx     Social History Social History   Tobacco Use   Smoking status: Former    Types: Cigarettes    Quit date: 04/18/2008    Years since quitting: 14.3   Smokeless tobacco: Never  Vaping Use   Vaping Use: Never used  Substance Use Topics   Alcohol use: Yes    Comment: 1 glass of wine rarely, occasionally   Drug use: Yes    Types: Marijuana    Comment: last use this am partial blunt, Former cocaine use since 2012     Allergies   Patient has no known allergies.   Review of Systems Review of Systems  Constitutional:  Positive for activity change, chills and fatigue. Negative for appetite change and fever.  HENT:  Positive for congestion. Negative for sinus pressure, sneezing and sore throat.   Respiratory:  Negative for cough and shortness of breath.   Cardiovascular:  Negative for chest pain.  Gastrointestinal:  Negative for abdominal pain, diarrhea, nausea and vomiting.  Genitourinary:  Positive for frequency and urgency. Negative for dysuria, vaginal bleeding, vaginal discharge and vaginal pain.  Musculoskeletal:  Positive for arthralgias and myalgias.  Neurological:  Negative for dizziness, light-headedness and headaches.     Physical Exam Triage Vital Signs ED Triage Vitals  Enc Vitals Group     BP 08/07/22 1627 (!) 152/93     Pulse Rate  08/07/22 1627 71     Resp 08/07/22 1627 16     Temp 08/07/22 1627 99.3 F (37.4 C)     Temp Source 08/07/22 1627 Oral     SpO2 08/07/22 1627 97 %     Weight --      Height --      Head Circumference --      Peak Flow --      Pain Score 08/07/22 1629 8     Pain Loc --      Pain Edu? --      Excl. in GC? --    No data found.  Updated Vital Signs BP (!) 152/93 (BP Location: Left Arm)   Pulse 71   Temp 99.3 F (37.4 C) (Oral)   Resp 16   LMP 04/25/2011   SpO2 97%   Visual Acuity Right Eye Distance:   Left Eye Distance:   Bilateral Distance:    Right Eye Near:   Left Eye Near:    Bilateral Near:     Physical Exam Vitals reviewed.  Constitutional:      General: She is awake. She is not in acute distress.    Appearance: Normal appearance. She is well-developed. She is not ill-appearing.     Comments: Very pleasant female appears stated age in no acute distress sitting comfortably in exam room  HENT:     Head: Normocephalic and atraumatic.     Right Ear: Tympanic membrane, ear canal and external ear normal. Tympanic membrane is not erythematous or bulging.     Left Ear: Tympanic membrane, ear canal and external ear normal. Tympanic membrane is not erythematous or bulging.     Nose:     Right Sinus: No maxillary sinus tenderness or frontal sinus tenderness.     Left Sinus: No maxillary sinus tenderness or frontal sinus tenderness.     Mouth/Throat:     Pharynx: Uvula midline. No oropharyngeal exudate or posterior oropharyngeal erythema.  Cardiovascular:  Rate and Rhythm: Normal rate and regular rhythm.     Heart sounds: Normal heart sounds, S1 normal and S2 normal. No murmur heard. Pulmonary:     Effort: Pulmonary effort is normal.     Breath sounds: Normal breath sounds. No wheezing, rhonchi or rales.     Comments: Clear to auscultation bilaterally Abdominal:     General: Bowel sounds are normal.     Palpations: Abdomen is soft.     Tenderness: There is no  abdominal tenderness. There is no right CVA tenderness, left CVA tenderness, guarding or rebound.  Psychiatric:        Behavior: Behavior is cooperative.      UC Treatments / Results  Labs (all labs ordered are listed, but only abnormal results are displayed) Labs Reviewed  POCT URINALYSIS DIP (MANUAL ENTRY) - Abnormal; Notable for the following components:      Result Value   Ketones, POC UA small (15) (*)    Blood, UA trace-intact (*)    All other components within normal limits  URINE CULTURE  SARS CORONAVIRUS 2 (TAT 6-24 HRS)  POCT INFLUENZA A/B    EKG   Radiology No results found.  Procedures Procedures (including critical care time)  Medications Ordered in UC Medications - No data to display  Initial Impression / Assessment and Plan / UC Course  I have reviewed the triage vital signs and the nursing notes.  Pertinent labs & imaging results that were available during my care of the patient were reviewed by me and considered in my medical decision making (see chart for details).     Patient is well-appearing, afebrile, nontoxic, nontachycardic.  No evidence of acute infection on physical exam that warrant initiation of antibiotics.  Urine was obtained given associated urinary frequency that showed ketones and trace red blood cells but no evidence of infection.  This was sent for culture given her history of recurrent UTI with current symptoms but will defer antibiotics until results are available.  Flu testing was negative.  COVID test is pending.  We discussed likely viral etiology of symptoms.  She is a candidate for antiviral therapy if positive for COVID and would recommend Paxlovid.  Her last metabolic panel was from 01/24/2022 that showed creatinine of 0.81 and EGFR of 82.42.  This is just slightly outside the last 6 months window but she has never had any kidney issues or history of chronic kidney disease so this was not repeated and we will use this to dose  Paxlovid if positive.  If she does take Paxlovid she will need to take her dose approximately an hour before her Questran/cholestyramine to prevent decreased absorption.  She is use over-the-counter medication including alternating Tylenol and ibuprofen for pain.  She was given fluticasone to help with nasal congestion.  Recommend that she rest and drink plenty of fluid.  She can also use nasal saline and sinus rinses for additional symptom relief.  Discussed that if her symptoms are not improving within a few days she should return for reevaluation.  If she has any worsening symptoms including high fever not responding to medication, nausea/vomiting interfering with oral intake, fever, chest pain, shortness of breath, worsening cough she needs to be seen immediately.  Strict return precautions given.  Work excuse note provided.  Final Clinical Impressions(s) / UC Diagnoses   Final diagnoses:  Viral illness  Body aches  Chills  Urinary frequency     Discharge Instructions      I believe  that you have a virus.  You tested negative for flu.  We will contact you if you are positive for COVID.  Use Tylenol and ibuprofen for pain.  You can use Flonase for congestion.  I also recommend nasal saline and sinus rinses as well as a humidifier in your room at night.  Your urine did not show any evidence of infection.  We are sending this for culture and we will contact you if need to start antibiotics.  If your symptoms are improving within a few days please return for reevaluation.  If you have any worsening symptoms including severe pain, fever not responding to medication, nausea/vomiting interfering with oral intake, chest pain, shortness of breath, bad cough you should be seen immediately.     ED Prescriptions     Medication Sig Dispense Auth. Provider   fluticasone (FLONASE) 50 MCG/ACT nasal spray Place 1 spray into both nostrils daily. 16 g Nolie Bignell K, PA-C      PDMP not reviewed this  encounter.   Jeani Hawking, PA-C 08/07/22 1817

## 2022-08-07 NOTE — ED Triage Notes (Addendum)
Patient c/o dysuria, frequent urination, generalized body aches, nasal congestion, and chills since last night.  Patient states she took Catering manager Plus last night.

## 2022-08-08 LAB — URINE CULTURE

## 2022-08-08 LAB — SARS CORONAVIRUS 2 (TAT 6-24 HRS): SARS Coronavirus 2: NEGATIVE

## 2022-08-11 DIAGNOSIS — M6281 Muscle weakness (generalized): Secondary | ICD-10-CM | POA: Diagnosis not present

## 2022-08-11 DIAGNOSIS — M62838 Other muscle spasm: Secondary | ICD-10-CM | POA: Diagnosis not present

## 2022-08-11 DIAGNOSIS — M6289 Other specified disorders of muscle: Secondary | ICD-10-CM | POA: Diagnosis not present

## 2022-08-11 DIAGNOSIS — R102 Pelvic and perineal pain: Secondary | ICD-10-CM | POA: Diagnosis not present

## 2022-08-11 DIAGNOSIS — F439 Reaction to severe stress, unspecified: Secondary | ICD-10-CM | POA: Diagnosis not present

## 2022-08-12 ENCOUNTER — Ambulatory Visit: Payer: BC Managed Care – PPO | Admitting: Family Medicine

## 2022-08-12 ENCOUNTER — Encounter: Payer: Self-pay | Admitting: Family Medicine

## 2022-08-12 VITALS — BP 122/86 | HR 76 | Temp 97.6°F | Ht 66.0 in | Wt 177.0 lb

## 2022-08-12 DIAGNOSIS — E119 Type 2 diabetes mellitus without complications: Secondary | ICD-10-CM

## 2022-08-12 DIAGNOSIS — E785 Hyperlipidemia, unspecified: Secondary | ICD-10-CM

## 2022-08-12 DIAGNOSIS — R3915 Urgency of urination: Secondary | ICD-10-CM

## 2022-08-12 DIAGNOSIS — R051 Acute cough: Secondary | ICD-10-CM

## 2022-08-12 DIAGNOSIS — I10 Essential (primary) hypertension: Secondary | ICD-10-CM

## 2022-08-12 DIAGNOSIS — R5383 Other fatigue: Secondary | ICD-10-CM

## 2022-08-12 DIAGNOSIS — E559 Vitamin D deficiency, unspecified: Secondary | ICD-10-CM

## 2022-08-12 DIAGNOSIS — J0140 Acute pansinusitis, unspecified: Secondary | ICD-10-CM

## 2022-08-12 DIAGNOSIS — E1169 Type 2 diabetes mellitus with other specified complication: Secondary | ICD-10-CM

## 2022-08-12 LAB — HEMOGLOBIN A1C: Hgb A1c MFr Bld: 6.4 % (ref 4.6–6.5)

## 2022-08-12 LAB — COMPREHENSIVE METABOLIC PANEL
ALT: 11 U/L (ref 0–35)
AST: 14 U/L (ref 0–37)
Albumin: 4.1 g/dL (ref 3.5–5.2)
Alkaline Phosphatase: 47 U/L (ref 39–117)
BUN: 11 mg/dL (ref 6–23)
CO2: 27 mEq/L (ref 19–32)
Calcium: 9.1 mg/dL (ref 8.4–10.5)
Chloride: 103 mEq/L (ref 96–112)
Creatinine, Ser: 0.84 mg/dL (ref 0.40–1.20)
GFR: 78.6 mL/min (ref >60.00)
Glucose, Bld: 114 mg/dL — ABNORMAL HIGH (ref 70–99)
Potassium: 3.7 mEq/L (ref 3.5–5.1)
Sodium: 138 mEq/L (ref 135–145)
Total Bilirubin: 0.8 mg/dL (ref 0.2–1.2)
Total Protein: 7.7 g/dL (ref 6.0–8.3)

## 2022-08-12 LAB — POC URINALSYSI DIPSTICK (AUTOMATED)
Bilirubin, UA: NEGATIVE
Blood, UA: NEGATIVE
Glucose, UA: NEGATIVE
Ketones, UA: NEGATIVE
Leukocytes, UA: NEGATIVE
Nitrite, UA: NEGATIVE
Protein, UA: POSITIVE — AB
Spec Grav, UA: >=1.03 — AB (ref 1.010–1.025)
Urobilinogen, UA: 0.2 E.U./dL
pH, UA: 6 (ref 5.0–8.0)

## 2022-08-12 LAB — LIPID PANEL
Cholesterol: 173 mg/dL (ref 0–200)
HDL: 38.9 mg/dL — ABNORMAL LOW (ref >39.00)
LDL Cholesterol: 115 mg/dL — ABNORMAL HIGH (ref 0–99)
NonHDL: 133.9
Total CHOL/HDL Ratio: 4
Triglycerides: 94 mg/dL (ref 0.0–149.0)
VLDL: 18.8 mg/dL (ref 0.0–40.0)

## 2022-08-12 LAB — CBC WITH DIFFERENTIAL/PLATELET
Basophils Absolute: 0 10*3/uL (ref 0.0–0.1)
Basophils Relative: 0.9 % (ref 0.0–3.0)
Eosinophils Absolute: 0.1 10*3/uL (ref 0.0–0.7)
Eosinophils Relative: 2.9 % (ref 0.0–5.0)
HCT: 38.1 % (ref 36.0–46.0)
Hemoglobin: 12.6 g/dL (ref 12.0–15.0)
Lymphocytes Relative: 54.5 % — ABNORMAL HIGH (ref 12.0–46.0)
Lymphs Abs: 2 10*3/uL (ref 0.7–4.0)
MCHC: 33 g/dL (ref 30.0–36.0)
MCV: 89.5 fl (ref 78.0–100.0)
Monocytes Absolute: 0.4 10*3/uL (ref 0.1–1.0)
Monocytes Relative: 10.7 % (ref 3.0–12.0)
Neutro Abs: 1.2 10*3/uL — ABNORMAL LOW (ref 1.4–7.7)
Neutrophils Relative %: 31 % — ABNORMAL LOW (ref 43.0–77.0)
Platelets: 214 10*3/uL (ref 150.0–400.0)
RBC: 4.26 Mil/uL (ref 3.87–5.11)
RDW: 15.2 % (ref 11.5–15.5)
WBC: 3.8 10*3/uL — ABNORMAL LOW (ref 4.0–10.5)

## 2022-08-12 LAB — VITAMIN D 25 HYDROXY (VIT D DEFICIENCY, FRACTURES): VITD: 28.87 ng/mL — ABNORMAL LOW (ref 30.00–100.00)

## 2022-08-12 LAB — TSH: TSH: 0.99 u[IU]/mL (ref 0.35–5.50)

## 2022-08-12 LAB — VITAMIN B12: Vitamin B-12: 272 pg/mL (ref 211–911)

## 2022-08-12 MED ORDER — FLUTICASONE PROPIONATE 50 MCG/ACT NA SUSP
2.0000 | Freq: Every day | NASAL | 6 refills | Status: AC
Start: 2022-08-12 — End: ?

## 2022-08-12 MED ORDER — AMOXICILLIN-POT CLAVULANATE 875-125 MG PO TABS
1.0000 | ORAL_TABLET | Freq: Two times a day (BID) | ORAL | 0 refills | Status: DC
Start: 2022-08-12 — End: 2022-10-14

## 2022-08-12 NOTE — Progress Notes (Signed)
Subjective:  Deborah Haney is a 57 y.o. female who presents for a 7 day hx of fatigue, hot flashes, nasal congestion, sinus pressure, post nasal drainage, cough. States her vision is blurry in the mornings.  Recent visit to UC. Negative flu and Covid.   She also c/o urinary urgency and increased thirst.   She is not taking anything for her symptoms.   Does not check blood sugars with hx of DM.   Denies fever, chills, dizziness, chest pain, palpitations, shortness of breath, abdominal pain, N/V/D, LE edema.    ROS as in subjective.   Objective: Vitals:   08/12/22 0758  BP: 122/86  Pulse: 76  Temp: 97.6 F (36.4 C)  SpO2: 98%    General appearance: Alert, WD/WN, no distress, mildly ill appearing                             Skin: warm, no rash                           Head: + maxillary sinus tenderness                            Eyes: conjunctiva normal, corneas clear, PERRLA                            Ears: pearly TMs, external ear canals normal                          Nose: septum midline, turbinates swollen, with erythema             Mouth/throat: MMM, tongue normal, mild pharyngeal erythema                           Neck: supple, no adenopathy, no thyromegaly, nontender                          Heart: RRR                         Lungs: CTA bilaterally, no wheezes, rales, or rhonchi   Extremities: no edema    Foot exam normal.       Assessment: Fatigue, unspecified type - Plan: TSH, VITAMIN D 25 Hydroxy (Vit-D Deficiency, Fractures), Vitamin B12, Vitamin B12, VITAMIN D 25 Hydroxy (Vit-D Deficiency, Fractures), TSH  Urinary urgency - Plan: POCT Urinalysis Dipstick (Automated)  Acute pansinusitis, recurrence not specified - Plan: amoxicillin-clavulanate (AUGMENTIN) 875-125 MG tablet, fluticasone (FLONASE) 50 MCG/ACT nasal spray  Acute cough  Essential hypertension  Controlled type 2 diabetes mellitus without complication, without long-term current use of insulin  (HCC) - Plan: CBC with Differential/Platelet, Comprehensive metabolic panel, Hemoglobin A1c, Hemoglobin A1c, Comprehensive metabolic panel, CBC with Differential/Platelet  Vitamin D deficiency - Plan: VITAMIN D 25 Hydroxy (Vit-D Deficiency, Fractures), VITAMIN D 25 Hydroxy (Vit-D Deficiency, Fractures)  Hyperlipidemia associated with type 2 diabetes mellitus (HCC) - Plan: Lipid panel, Lipid panel    Plan: Augmentin prescribed for sinusitis.  UA negative for infection. She has upcoming appt with urology.  Flonase prescribed. Increase water intake.  Suggested symptomatic OTC remedies. Nasal saline spray for congestion.   She will call and schedule an eye exam.  Check labs to evaluate chronic health conditions including diabetes. DM is currently diet controlled.  Follow up if not back to baseline after completing the antibiotic or sooner if worsening.

## 2022-08-12 NOTE — Patient Instructions (Addendum)
  Please go downstairs for labs before you leave.  Take the antibiotic as prescribed with food.  Use the Flonase nasal spray as prescribed.  You can also use saline nasal spray over-the-counter.  Take Tylenol as needed.  Do salt water gargles for your sore throat or use Chloraseptic spray over-the-counter.  Take Mucinex DM or Robitussin DM for cough and congestion.  Increase your water intake.  Please call and schedule your diabetic eye exam.

## 2022-08-17 DIAGNOSIS — F439 Reaction to severe stress, unspecified: Secondary | ICD-10-CM | POA: Diagnosis not present

## 2022-08-22 DIAGNOSIS — F439 Reaction to severe stress, unspecified: Secondary | ICD-10-CM | POA: Diagnosis not present

## 2022-08-31 DIAGNOSIS — F439 Reaction to severe stress, unspecified: Secondary | ICD-10-CM | POA: Diagnosis not present

## 2022-09-01 ENCOUNTER — Encounter: Payer: BC Managed Care – PPO | Admitting: Family Medicine

## 2022-09-05 ENCOUNTER — Other Ambulatory Visit: Payer: Self-pay | Admitting: Family Medicine

## 2022-09-05 DIAGNOSIS — F439 Reaction to severe stress, unspecified: Secondary | ICD-10-CM | POA: Diagnosis not present

## 2022-09-05 DIAGNOSIS — M6289 Other specified disorders of muscle: Secondary | ICD-10-CM | POA: Diagnosis not present

## 2022-09-05 DIAGNOSIS — M62838 Other muscle spasm: Secondary | ICD-10-CM | POA: Diagnosis not present

## 2022-09-05 DIAGNOSIS — M6281 Muscle weakness (generalized): Secondary | ICD-10-CM | POA: Diagnosis not present

## 2022-09-05 DIAGNOSIS — N941 Unspecified dyspareunia: Secondary | ICD-10-CM | POA: Diagnosis not present

## 2022-09-05 DIAGNOSIS — I10 Essential (primary) hypertension: Secondary | ICD-10-CM

## 2022-09-22 DIAGNOSIS — F439 Reaction to severe stress, unspecified: Secondary | ICD-10-CM | POA: Diagnosis not present

## 2022-09-28 DIAGNOSIS — F439 Reaction to severe stress, unspecified: Secondary | ICD-10-CM | POA: Diagnosis not present

## 2022-10-01 ENCOUNTER — Emergency Department (HOSPITAL_BASED_OUTPATIENT_CLINIC_OR_DEPARTMENT_OTHER)
Admission: EM | Admit: 2022-10-01 | Discharge: 2022-10-01 | Disposition: A | Discharge status: Home / Self Care | Payer: BC Managed Care – PPO | Attending: Emergency Medicine | Admitting: Emergency Medicine

## 2022-10-01 ENCOUNTER — Emergency Department (HOSPITAL_BASED_OUTPATIENT_CLINIC_OR_DEPARTMENT_OTHER): Payer: BC Managed Care – PPO

## 2022-10-01 ENCOUNTER — Encounter (HOSPITAL_BASED_OUTPATIENT_CLINIC_OR_DEPARTMENT_OTHER): Payer: Self-pay | Admitting: Emergency Medicine

## 2022-10-01 DIAGNOSIS — E119 Type 2 diabetes mellitus without complications: Secondary | ICD-10-CM | POA: Diagnosis not present

## 2022-10-01 DIAGNOSIS — R0789 Other chest pain: Secondary | ICD-10-CM | POA: Diagnosis not present

## 2022-10-01 DIAGNOSIS — Z79899 Other long term (current) drug therapy: Secondary | ICD-10-CM | POA: Diagnosis not present

## 2022-10-01 DIAGNOSIS — R1013 Epigastric pain: Secondary | ICD-10-CM | POA: Diagnosis not present

## 2022-10-01 DIAGNOSIS — I440 Atrioventricular block, first degree: Secondary | ICD-10-CM | POA: Diagnosis not present

## 2022-10-01 DIAGNOSIS — I10 Essential (primary) hypertension: Secondary | ICD-10-CM | POA: Insufficient documentation

## 2022-10-01 DIAGNOSIS — R079 Chest pain, unspecified: Secondary | ICD-10-CM | POA: Diagnosis not present

## 2022-10-01 LAB — BASIC METABOLIC PANEL
Anion gap: 9 (ref 5–15)
BUN: 12 mg/dL (ref 6–20)
CO2: 24 mmol/L (ref 22–32)
Calcium: 9.4 mg/dL (ref 8.9–10.3)
Chloride: 105 mmol/L (ref 98–111)
Creatinine, Ser: 0.72 mg/dL (ref 0.44–1.00)
GFR, Estimated: >60 mL/min (ref >60)
Glucose, Bld: 94 mg/dL (ref 70–99)
Potassium: 3.8 mmol/L (ref 3.5–5.1)
Sodium: 138 mmol/L (ref 135–145)

## 2022-10-01 LAB — CBC WITH DIFFERENTIAL/PLATELET
Abs Immature Granulocytes: 0.01 10*3/uL (ref 0.00–0.07)
Basophils Absolute: 0 10*3/uL (ref 0.0–0.1)
Basophils Relative: 1 %
Eosinophils Absolute: 0.2 10*3/uL (ref 0.0–0.5)
Eosinophils Relative: 3 %
HCT: 36.3 % (ref 36.0–46.0)
Hemoglobin: 12 g/dL (ref 12.0–15.0)
Immature Granulocytes: 0 %
Lymphocytes Relative: 45 %
Lymphs Abs: 2.8 10*3/uL (ref 0.7–4.0)
MCH: 29.6 pg (ref 26.0–34.0)
MCHC: 33.1 g/dL (ref 30.0–36.0)
MCV: 89.6 fL (ref 80.0–100.0)
Monocytes Absolute: 0.4 10*3/uL (ref 0.1–1.0)
Monocytes Relative: 6 %
Neutro Abs: 2.8 10*3/uL (ref 1.7–7.7)
Neutrophils Relative %: 45 %
Platelets: 265 10*3/uL (ref 150–400)
RBC: 4.05 MIL/uL (ref 3.87–5.11)
RDW: 14.6 % (ref 11.5–15.5)
WBC: 6.2 10*3/uL (ref 4.0–10.5)
nRBC: 0 % (ref 0.0–0.2)

## 2022-10-01 LAB — LIPASE, BLOOD: Lipase: 43 U/L (ref 11–51)

## 2022-10-01 LAB — TROPONIN I (HIGH SENSITIVITY): Troponin I (High Sensitivity): 2 ng/L (ref <18)

## 2022-10-01 MED ORDER — NAPROXEN 375 MG PO TABS: 375.0000 mg | ORAL_TABLET | Freq: Two times a day (BID) | ORAL | 0 refills | Status: AC

## 2022-10-01 MED ORDER — KETOROLAC TROMETHAMINE 30 MG/ML IJ SOLN
30.0000 mg | Freq: Once | INTRAMUSCULAR | Status: AC
  Administered 2022-10-01: 30 mg via INTRAVENOUS
  Filled 2022-10-01: qty 1

## 2022-10-01 NOTE — ED Notes (Signed)
Called lab to run hold blood for CBC, BMP, and trop. Spoke with [employer].

## 2022-10-01 NOTE — Discharge Instructions (Addendum)
As discussed, your pain can be due to inflammation of the chest wall. I have sent a prescription for Naproxen to your pharmacy to take twice a day as needed. This medication should help with pain and inflammation, so make sure you take this. If your pain persists, you can get Lidocaine patches from your pharmacy to apply to the area of pain for additional relief. You can use one patch a day.  I have provided information for cardiology to follow up with for further evaluation of your symptoms.  Get help right away if: You feel sick to your stomach (nauseous) or you throw up (vomit). You feel sweaty or light-headed. You have a cough with mucus from your lungs (sputum) or you cough up blood. You are short of breath.

## 2022-10-01 NOTE — ED Provider Notes (Cosign Needed Addendum)
[place] EMERGENCY DEPARTMENT AT [facility] Provider Note   CSN: [id number] Arrival date & time: 10/01/22  1336     History  Chief Complaint  Patient presents with   Chest Pain    Deborah Haney is a 57 y.o. female with a history of diabetes mellitus, hypertension, and GERD who presents to the ED today for chest pain. Patient reports pain from the right side of her chest down to her ribs for the past 4 days. The pain worsens with movement and direct pressure to the chest. She has taken Ibuprofen and Aleve without any relief of symptoms. Pain is not positional. Denies shortness of breath, fever, nausea, vomiting, or changes to bowel habits.  Patient reports that several years ago she had chest pain due to inflammation of her chest wall but this pain today feels different.    Home Medications Prior to Admission medications   Medication Sig Start Date End Date Taking? Authorizing Provider  naproxen (NAPROSYN) 375 MG tablet Take 1 tablet (375 mg total) by mouth 2 (two) times daily for 14 days. 10/01/22 10/15/22 Yes Maxwell Marion, PA-C  Accu-Chek Softclix Lancets lancets Use as instructed;  Check FSBS BID - Include strips # 50 with 5 refills, Lancets #50 with 5 refills DX: Type 2 DM - ICD 10: E11.9 04/16/21   Burnard Hawthorne B, PA-C  amoxicillin-clavulanate (AUGMENTIN) 875-125 MG tablet Take 1 tablet by mouth 2 (two) times daily. 08/12/22   Henson, Vickie L, NP-C  Blood Glucose Monitoring Suppl (CONTOUR MONITOR) w/Device KIT 1 kit by Does not apply route 2 (two) times a day. 07/02/18   Henson, Vickie L, NP-C  cholestyramine (QUESTRAN) 4 g packet MIX AND DRINK 1 PACKET DAILY 06/10/22   Zehr, Shanda Bumps D, PA-C  CONTOUR NEXT TEST test strip USE 1 STRIP TO CHECK GLUCOSE TWICE DAILY 02/27/20   Henson, Vickie L, NP-C  fluticasone (FLONASE) 50 MCG/ACT nasal spray Place 2 sprays into both nostrils daily. 08/12/22   Henson, Vickie L, NP-C  lisinopril-hydrochlorothiazide (ZESTORETIC) 10-12.5 MG  tablet Take 1 tablet by mouth once daily 09/05/22   Henson, Vickie L, NP-C  mupirocin cream (BACTROBAN) 2 % Apply 1 application. topically 2 (two) times daily. 05/13/21   Jake Shark, PA-C  NEOMYCIN-POLYMYXIN-HYDROCORTISONE (CORTISPORIN) 1 % SOLN OTIC solution neomycin-polymyxin-hydrocort 3.5 mg/mL-10,000 unit/mL-1 % ear solution  INSTILL 2 DROPS INTO THE LEFT EAR THREE TIMES DAILY    [provider]  omeprazole (PRILOSEC) 40 MG capsule Take 1 capsule (40 mg total) by mouth 2 (two) times daily. Prilosec (omeprazole) 40 mg PO BID for 8 weeks. 11/29/21   Imogene Burn, MD  Probiotic Product (PROBIOTIC-10 PO) Take 1 mg by mouth 1 day or 1 dose. Pt. Takes every other day    [provider]      Allergies    Patient has no known allergies.    Review of Systems   Review of Systems  Cardiovascular:  Positive for chest pain.  All other systems reviewed and are negative.   Physical Exam Updated Vital Signs BP 133/87   Pulse 73   Temp 98.2 F (36.8 C) (Oral)   Resp 19   Ht 5\' 6"  (1.676 m)   Wt 85.7 kg   LMP 04/25/2011   SpO2 100%   BMI 30.51 kg/m  Physical Exam Vitals and nursing note reviewed.  Constitutional:      General: She is not in acute distress.    Appearance: Normal appearance.  HENT:  Head: Normocephalic and atraumatic.     Nose: Nose normal.     Mouth/Throat:     Mouth: Mucous membranes are moist.  Eyes:     Conjunctiva/sclera: Conjunctivae normal.     Pupils: Pupils are equal, round, and reactive to light.  Cardiovascular:     Rate and Rhythm: Normal rate and regular rhythm.     Pulses: Normal pulses.     Heart sounds: Normal heart sounds.  Pulmonary:     Effort: Pulmonary effort is normal.     Breath sounds: Normal breath sounds.  Abdominal:     Palpations: Abdomen is soft.     Tenderness: There is abdominal tenderness.     Comments: epigastrium tenderness  Musculoskeletal:     Right lower leg: No edema.     Left lower leg: No edema.   Skin:    General: Skin is warm and dry.     Capillary Refill: Capillary refill takes less than 2 seconds.     Findings: No rash.  Neurological:     General: No focal deficit present.     Mental Status: She is alert.     Sensory: No sensory deficit.     Motor: No weakness.  Psychiatric:        Mood and Affect: Mood normal.        Behavior: Behavior normal.     ED Results / Procedures / Treatments   Labs (all labs ordered are listed, but only abnormal results are displayed) Labs Reviewed  BASIC METABOLIC PANEL  CBC WITH DIFFERENTIAL/PLATELET  LIPASE, BLOOD  TROPONIN I (HIGH SENSITIVITY)    EKG EKG Interpretation Date/Time:  Saturday October 01 2022 13:47:05 EDT Ventricular Rate:  69 PR Interval:  228 QRS Duration:  92 QT Interval:  402 QTC Calculation: 430 R Axis:   58  Text Interpretation: Sinus rhythm with 1st degree A-V block Otherwise normal ECG When compared with ECG of 11-Apr-2021 19:32, PREVIOUS ECG IS PRESENT No significant change since last tracing Confirmed by Margarita Grizzle [phone]) on 10/01/2022 2:17:21 PM  Radiology DG Chest Portable 1 View  Result Date: 10/01/2022 CLINICAL DATA:  Chest pain EXAM: PORTABLE CHEST 1 VIEW COMPARISON:  X-ray 09/22/2021 FINDINGS: No consolidation, pneumothorax or effusion. No edema. Normal cardiopericardial silhouette. Overlapping cardiac leads. Degenerative changes of the spine. IMPRESSION: No acute cardiopulmonary disease. Electronically Signed   By: Karen Kays M.D.   On: 10/01/2022 14:55    Procedures Procedures: not indicated.   Medications Ordered in ED Medications  ketorolac (TORADOL) 30 MG/ML injection 30 mg (30 mg Intravenous Given 10/01/22 1440)    ED Course/ Medical Decision Making/ A&P                             Medical Decision Making Amount and/or Complexity of Data Reviewed Labs: ordered. Radiology: ordered.  Risk Prescription drug management.   This patient presents to the ED for concern of right  sided chest and upper abdominal pain, this involves an extensive number of treatment options, and is a complaint that carries with it a high risk of complications and morbidity.   Differential diagnosis includes: ACS, pneumonia, pleurisy, costochondritis, pancreatitis, GERD, etc.   Co morbidities that complicate the patient evaluation  Hypertension Dyslipidemia GERD   Additional history obtained:  Additional history obtained from patient's records.   Cardiac Monitoring / EKG:  The patient was maintained on a cardiac monitor.  I personally viewed and interpreted the  cardiac monitored which showed: sinus rhythm with 1st degree AV block with a heart rate of 69 bpm. No significant changes from previous EKG.   Lab Tests:  I ordered and personally interpreted labs.  The pertinent results include:   Negative troponin. CBC, BMP, and Lipase are unremarkable. No signs of acute infection, anemia, electrolyte abnormality, or AKI.    Imaging Studies ordered:  I ordered imaging studies including CXR.  I independently visualized and interpreted imaging which showed: I agree with the radiologist interpretation   Problem List / ED Course / Critical interventions / Medication management  Right sided chest pain x4 days I ordered medications including:  Ketoralac for pain  Reevaluation of the patient after these medicines showed that the patient resolved I have reviewed the patients home medicines and have made adjustments as needed   Social Determinants of Health:  Tobacco use   Test / Admission - Considered:  Discussed results with patient. She is stable and safe for discharge home. Prescription for Naproxen sent to the pharmacy. Return precautions provided.        Final Clinical Impression(s) / ED Diagnoses Final diagnoses:  Chest wall pain    Rx / DC Orders ED Discharge Orders          Ordered    naproxen (NAPROSYN) 375 MG tablet  2 times daily        10/01/22  1544              Maxwell Marion, PA-C 10/01/22 1615    Maxwell Marion, PA-C 10/01/22 1619    Margarita Grizzle, MD 10/05/22 [phone]

## 2022-10-01 NOTE — ED Notes (Signed)
 RN reviewed discharge instructions with pt. Pt verbalized understanding and had no further questions. VSS upon discharge.  

## 2022-10-01 NOTE — ED Notes (Signed)
Lt grn, blue, lav, gold sent to lab.

## 2022-10-01 NOTE — ED Triage Notes (Signed)
Pt arrives pov, steady gait c/o RT side CP and epigastric pain x 4 days. Denies n/v

## 2022-10-13 DIAGNOSIS — F439 Reaction to severe stress, unspecified: Secondary | ICD-10-CM | POA: Diagnosis not present

## 2022-10-14 ENCOUNTER — Ambulatory Visit (INDEPENDENT_AMBULATORY_CARE_PROVIDER_SITE_OTHER): Payer: BC Managed Care – PPO | Admitting: Family Medicine

## 2022-10-14 ENCOUNTER — Encounter: Payer: Self-pay | Admitting: Family Medicine

## 2022-10-14 VITALS — BP 130/86 | HR 65 | Temp 97.6°F | Ht 66.0 in | Wt 186.0 lb

## 2022-10-14 DIAGNOSIS — I10 Essential (primary) hypertension: Secondary | ICD-10-CM

## 2022-10-14 DIAGNOSIS — Z113 Encounter for screening for infections with a predominantly sexual mode of transmission: Secondary | ICD-10-CM | POA: Diagnosis not present

## 2022-10-14 DIAGNOSIS — E119 Type 2 diabetes mellitus without complications: Secondary | ICD-10-CM

## 2022-10-14 DIAGNOSIS — I440 Atrioventricular block, first degree: Secondary | ICD-10-CM | POA: Insufficient documentation

## 2022-10-14 DIAGNOSIS — R350 Frequency of micturition: Secondary | ICD-10-CM

## 2022-10-14 DIAGNOSIS — Z0001 Encounter for general adult medical examination with abnormal findings: Secondary | ICD-10-CM

## 2022-10-14 DIAGNOSIS — R0789 Other chest pain: Secondary | ICD-10-CM | POA: Diagnosis not present

## 2022-10-14 DIAGNOSIS — N951 Menopausal and female climacteric states: Secondary | ICD-10-CM

## 2022-10-14 DIAGNOSIS — G8929 Other chronic pain: Secondary | ICD-10-CM | POA: Insufficient documentation

## 2022-10-14 DIAGNOSIS — Z1231 Encounter for screening mammogram for malignant neoplasm of breast: Secondary | ICD-10-CM

## 2022-10-14 LAB — POC URINALSYSI DIPSTICK (AUTOMATED)
Bilirubin, UA: NEGATIVE
Blood, UA: NEGATIVE
Glucose, UA: NEGATIVE
Ketones, UA: NEGATIVE
Nitrite, UA: NEGATIVE
Protein, UA: NEGATIVE
Spec Grav, UA: >=1.03 — AB (ref 1.010–1.025)
Urobilinogen, UA: 0.2 E.U./dL
pH, UA: 6 (ref 5.0–8.0)

## 2022-10-14 NOTE — Patient Instructions (Addendum)
Call and schedule with your OB/GYN  Call and schedule your mammogram and [facility] [phone]  Call and schedule eye exam  Call and schedule with [facility] (leg pain) [phone]  I have referred you to cardiology. They will call you to schedule a visit.

## 2022-10-14 NOTE — Progress Notes (Unsigned)
Complete physical exam  [name]   DOB: [birth year]   57 y.o. Female  MRN: [id number]  Subjective:    Chief Complaint  Patient presents with   Annual Exam    fasting   She is here for a complete physical exam.  OB/GYN-   ED visit for chest pain.   Right leg pain. Thigh to ankle. Can't sleep on right side.   DM- A1c 6.4% on 08/12/2022     Health Maintenance  Topic Date Due   Zoster (Shingles) Vaccine (1 of 2) Never done   Eye exam for diabetics  02/23/2022   Mammogram  10/10/2022   Flu Shot  10/06/2022   COVID-19 Vaccine (4 - 2023-24 season) 10/30/2022*   Yearly kidney health urinalysis for diabetes  01/25/2023   Hemoglobin A1C  02/11/2023   Complete foot exam   08/12/2023   Yearly kidney function blood test for diabetes  10/01/2023   DTaP/Tdap/Td vaccine (2 - Td or Tdap) 01/17/2026   Colon Cancer Screening  11/30/2031   Hepatitis C Screening  Completed   HIV Screening  Completed   HPV Vaccine  Aged Out   Pap Smear  Discontinued  *Topic was postponed. The date shown is not the original due date.    Wears seatbelt always, uses sunscreen, smoke detectors in home and functioning, does not text while driving, feels safe in home environment.  Depression screening:    10/14/2022   10:03 AM 11/16/2021    9:42 AM 05/13/2021    1:56 PM  Depression screen PHQ 2/9  Decreased Interest 0 1 0  Down, Depressed, Hopeless 1 1 0  PHQ - 2 Score 1 2 0  Altered sleeping 0 2   Tired, decreased energy 0 1   Change in appetite 0 3   Feeling bad or failure about yourself  1 1   Trouble concentrating 1 2   Moving slowly or fidgety/restless 1 0   Suicidal thoughts 0 0   PHQ-9 Score 4 11   Difficult doing work/chores Extremely dIfficult Somewhat difficult    Anxiety Screening:     No data to display          Vision:Not within last year  and Dental: {CHL AMB ROS DENTAL (Optional):27383::"No current dental problems"}  Patient Active Problem List   Diagnosis Date  Noted   1st degree AV block 10/14/2022   Chronic pain of right lower extremity 10/14/2022   Hot flashes due to menopause 10/14/2022   Diarrhea 01/24/2022   History of sexual behavior with high risk of exposure to communicable disease 01/24/2022   Vaginal discharge 01/24/2022   Anemia 11/16/2021   Diverticulitis 11/16/2021   Gastroesophageal reflux disease 11/16/2021   Abscess 11/16/2021   Fatigue 11/16/2021   Bloating 07/07/2021   Rectal bleeding 07/07/2021   Irritable bowel syndrome 07/07/2021   H/O: hysterectomy 05/13/2021   Menopausal symptoms 05/13/2021   Elevated blood sugar 12/17/2020   Encounter for general adult medical examination with abnormal findings 12/17/2020   Type 2 diabetes mellitus with hyperglycemia, without long-term current use of insulin (HCC) 12/17/2020   Mixed dyslipidemia 12/17/2020   Situational depression 04/24/2020   Vitamin D deficiency 04/24/2020   Burning with urination 09/25/2019   Frequent urination 09/25/2019   Gross hematuria 09/25/2019   Internal bleeding hemorrhoids 08/30/2019   Change in bowel habit 08/30/2019   Loose stools 08/30/2019   Paresthesia of both lower extremities 11/14/2018   Elevated LDL cholesterol level 10/17/2018  Proteinuria 10/17/2018   History of 2019 novel coronavirus disease (COVID-19) 09/24/2018   History of pneumonia 09/24/2018   Controlled type 2 diabetes mellitus without complication, without long-term current use of insulin (HCC) 09/11/2018   Acute cough 06/12/2018   Allergic rhinitis due to pollen 06/12/2018   Acute cystitis with hematuria 03/05/2018   Dyslipidemia 06/05/2017   Essential hypertension 10/26/2015   Obesity (BMI 30.0-34.9) 10/26/2015   Chronic vaginitis 10/26/2015   Bipolar affective disorder (HCC) 10/26/2015   Atypical chest pain 07/14/2013   Dysfunctional uterine bleeding 03/02/2011   Past Medical History:  Diagnosis Date   Acute cholecystitis 07/14/2013   Acute on chronic respiratory  failure with hypoxia (HCC) 09/05/2018   Anemia    has history, takes iron   Anxiety    Bipolar affective disorder (HCC) 10/26/2015   Has seen counselor, declined medications per medical record from [facility]. hypermanic   Chest pain 07/14/2013   Chronic vaginitis 10/26/2015   Has h/o vag dryness and tried Estrace per [place] records   COVID-19 virus infection 08/24/2018   Depression    Diabetes (HCC)    Patient denies   Diverticulitis    no problems   Dysfunctional uterine bleeding 03/02/2011   Elevated hemoglobin A1c 10/26/2015   GERD (gastroesophageal reflux disease)    tums prn   Headache(784.0)    Hemorrhoids    HTN (hypertension)    BP meds since fall 2016   Internal hemorrhoids    Nausea & vomiting 07/14/2013   Obesity 10/26/2015   Perimenopausal symptom 04/30/2021   Rectal bleeding 03/15/2017   Past Surgical History:  Procedure Laterality Date   ABDOMINAL HYSTERECTOMY     CHOLECYSTECTOMY N/A 07/15/2013   Procedure: LAPAROSCOPIC CHOLECYSTECTOMY;  Surgeon: Emelia Loron, MD;  Location: [facility] OR;  Service: General;  Laterality: N/A;   COLONOSCOPY     ECTOPIC PREGNANCY SURGERY     laparotomy   HEMORRHOID BANDING     ROBOTIC ASSISTED LAP VAGINAL HYSTERECTOMY     Social History   Tobacco Use   Smoking status: Former    Current packs/day: 0.00    Types: Cigarettes    Quit date: 04/18/2008    Years since quitting: 14.4   Smokeless tobacco: Never  Vaping Use   Vaping status: Never Used  Substance Use Topics   Alcohol use: Yes    Comment: 1 glass of wine rarely, occasionally   Drug use: Yes    Types: Marijuana    Comment: last use this am partial blunt, Former cocaine use since 2012      Patient Care Team: Avanell Shackleton, NP-C as PCP - General (Family Medicine)   Outpatient Medications Prior to Visit  Medication Sig   Accu-Chek Softclix Lancets lancets Use as instructed;  Check FSBS BID - Include strips # 50 with 5 refills, Lancets #50 with 5 refills DX: Type  2 DM - ICD 10: E11.9   Blood Glucose Monitoring Suppl (CONTOUR MONITOR) w/Device KIT 1 kit by Does not apply route 2 (two) times a day.   cholestyramine (QUESTRAN) 4 g packet MIX AND DRINK 1 PACKET DAILY   CONTOUR NEXT TEST test strip USE 1 STRIP TO CHECK GLUCOSE TWICE DAILY   fluticasone (FLONASE) 50 MCG/ACT nasal spray Place 2 sprays into both nostrils daily.   lisinopril-hydrochlorothiazide (ZESTORETIC) 10-12.5 MG tablet Take 1 tablet by mouth once daily   mupirocin cream (BACTROBAN) 2 % Apply 1 application. topically 2 (two) times daily.   naproxen (NAPROSYN) 375 MG tablet Take  1 tablet (375 mg total) by mouth 2 (two) times daily for 14 days.   NEOMYCIN-POLYMYXIN-HYDROCORTISONE (CORTISPORIN) 1 % SOLN OTIC solution neomycin-polymyxin-hydrocort 3.5 mg/mL-10,000 unit/mL-1 % ear solution  INSTILL 2 DROPS INTO THE LEFT EAR THREE TIMES DAILY   omeprazole (PRILOSEC) 40 MG capsule Take 1 capsule (40 mg total) by mouth 2 (two) times daily. Prilosec (omeprazole) 40 mg PO BID for 8 weeks.   Probiotic Product (PROBIOTIC-10 PO) Take 1 mg by mouth 1 day or 1 dose. Pt. Takes every other day   [DISCONTINUED] amoxicillin-clavulanate (AUGMENTIN) 875-125 MG tablet Take 1 tablet by mouth 2 (two) times daily. (Patient not taking: Reported on 10/14/2022)   No facility-administered medications prior to visit.    ROS     Objective:    BP 130/86 (BP Location: Left Arm, Patient Position: Sitting, Cuff Size: Large)   Pulse 65   Temp 97.6 F (36.4 C) (Temporal)   Ht 5\' 6"  (1.676 m)   Wt 186 lb (84.4 kg)   LMP 04/25/2011   SpO2 99%   BMI 30.02 kg/m  BP Readings from Last 3 Encounters:  10/14/22 130/86  10/01/22 133/87  08/12/22 122/86   Wt Readings from Last 3 Encounters:  10/14/22 186 lb (84.4 kg)  10/01/22 189 lb (85.7 kg)  08/12/22 177 lb (80.3 kg)    Physical Exam   Results for orders placed or performed in visit on 10/14/22  POCT Urinalysis Dipstick (Automated)  Result Value Ref Range    Color, UA yellow    Clarity, UA cloudy    Glucose, UA Negative Negative   Bilirubin, UA negative    Ketones, UA negative    Spec Grav, UA >=1.030 (A) 1.010 - 1.025   Blood, UA negative    pH, UA 6.0 5.0 - 8.0   Protein, UA Negative Negative   Urobilinogen, UA 0.2 0.2 or 1.0 E.U./dL   Nitrite, UA negative    Leukocytes, UA Small (1+) (A) Negative      Assessment & Plan:    Routine Health Maintenance and Physical Exam  Problem List Items Addressed This Visit       Cardiovascular and Mediastinum   1st degree AV block   Relevant Orders   Ambulatory referral to Cardiology   Essential hypertension   Hot flashes due to menopause     Endocrine   Controlled type 2 diabetes mellitus without complication, without long-term current use of insulin (HCC)     Other   Atypical chest pain   Relevant Orders   Ambulatory referral to Cardiology   Chronic pain of right lower extremity   Encounter for general adult medical examination with abnormal findings - Primary   Other Visit Diagnoses     Screening mammogram for breast cancer       Relevant Orders   MM 3D SCREENING MAMMOGRAM BILATERAL BREAST   Urinary frequency       Relevant Orders   POCT Urinalysis Dipstick (Automated) (Completed)   Urine Culture   Screen for STD (sexually transmitted disease)       Relevant Orders   Hepatitis C antibody   Hepatitis B surface antigen   GC/Chlamydia Probe Amp   HIV Antibody (routine testing w rflx)   RPR       Return in about 3 months (around 01/14/2023) for Fasting follow up.     Hetty Blend, NP-C

## 2022-10-15 LAB — URINE CULTURE

## 2022-10-15 LAB — SYPHILIS: RPR W/REFLEX TO RPR TITER AND TREPONEMAL ANTIBODIES, TRADITIONAL SCREENING AND DIAGNOSIS ALGORITHM: RPR Ser Ql: NONREACTIVE

## 2022-10-15 LAB — HIV ANTIBODY (ROUTINE TESTING W REFLEX): HIV 1&2 Ab, 4th Generation: NONREACTIVE

## 2022-10-17 NOTE — Assessment & Plan Note (Signed)
Send urine for culture.

## 2022-10-17 NOTE — Assessment & Plan Note (Signed)
This is a new finding. Done in the ED. Reviewed ED notes. Currently asymptomatic. Referred to cardiology as recommended.

## 2022-10-17 NOTE — Assessment & Plan Note (Signed)
Reviewed ED notes. Currently asymptomatic. Referred to cardiology as recommended.

## 2022-10-17 NOTE — Assessment & Plan Note (Signed)
Continue current medications and low sodium diet. Check renal function. Monitor closely at home.

## 2022-10-17 NOTE — Assessment & Plan Note (Signed)
Diet controlled. Check A1c and renal function

## 2022-10-17 NOTE — Assessment & Plan Note (Signed)
-   Follow up with gyn

## 2022-10-17 NOTE — Assessment & Plan Note (Signed)
Preventive health care reviewed.  Sees OB/GYN. Counseling on healthy lifestyle including diet and exercise.  Recommend regular dental and eye exams.  Immunizations reviewed.  Discussed safety.

## 2022-10-24 DIAGNOSIS — F439 Reaction to severe stress, unspecified: Secondary | ICD-10-CM | POA: Diagnosis not present

## 2022-10-27 ENCOUNTER — Telehealth: Payer: BC Managed Care – PPO | Admitting: Nurse Practitioner

## 2022-10-27 DIAGNOSIS — J069 Acute upper respiratory infection, unspecified: Secondary | ICD-10-CM

## 2022-10-27 NOTE — Progress Notes (Signed)
If you would like to be tested for COVID please visit one of the Urgent Care locations below.   Since you are experiencing blurred vision as one of your symptoms  I feel your condition warrants further evaluation and I recommend that you be seen in a face to face visit.   NOTE: There will be NO CHARGE for this eVisit   If you are having a true medical emergency please call 911.      For an urgent face to face visit, [place] has eight urgent care centers for your convenience:   NEW!! [facility] Health Urgent Care Center at [facility] Get Driving Directions [phone] [address], [address] [place], [address]    [facility] Health Urgent Care Center at [facility] Get Driving Directions [phone] [address] [place], [place] [address]   [facility] Health Urgent Care Center [facility]) Get Driving Directions [phone] [address] [place], [place] [address]  [facility] Health Urgent Care Center [facility]) Get Driving Directions [phone] [address] [place],  [place]  [address]  [facility] Health Urgent Care Center [facility] - at [employer]  [phone] [place],  [place] [address]   [facility] Health Urgent Care at [facility] Get Driving Directions [phone] [address] [place], [place] [address]   [facility] Health Urgent Care at [facility] Get Driving Directions  [phone] [address] [place], [place] [address]   [facility] Health Urgent Care at [facility] Directions [phone] [address] [place], [place] [address]  Your MyChart E-visit questionnaire answers were reviewed by a board certified advanced clinical practitioner to complete your personal care plan based on your specific symptoms.  Thank you for using e-Visits.

## 2022-10-28 ENCOUNTER — Encounter: Payer: Self-pay | Admitting: Family Medicine

## 2022-10-28 ENCOUNTER — Ambulatory Visit: Payer: BC Managed Care – PPO | Admitting: Family Medicine

## 2022-10-28 VITALS — BP 122/86 | HR 85 | Temp 97.6°F | Ht 66.0 in | Wt 185.0 lb

## 2022-10-28 DIAGNOSIS — R6883 Chills (without fever): Secondary | ICD-10-CM | POA: Diagnosis not present

## 2022-10-28 DIAGNOSIS — R051 Acute cough: Secondary | ICD-10-CM | POA: Diagnosis not present

## 2022-10-28 DIAGNOSIS — B349 Viral infection, unspecified: Secondary | ICD-10-CM

## 2022-10-28 LAB — POC COVID19 BINAXNOW: SARS Coronavirus 2 Ag: NEGATIVE

## 2022-10-28 NOTE — Patient Instructions (Signed)
Continue treating your symptoms. If you take another Covid test this weekend and it is positive, you can do an E-visit.

## 2022-10-28 NOTE — Progress Notes (Signed)
Subjective:  Deborah Haney is a 57 y.o. female who presents for a 3 day hx of fever, chills, body aches, congestion, cough, mild shortness of breath, and diarrhea.   Denies dizziness, ST, abdominal pain, N/V  +exposure to Covid   No other aggravating or relieving factors.  No other c/o.  ROS as in subjective.   Objective: Vitals:   10/28/22 1441  BP: 122/86  Pulse: 85  Temp: 97.6 F (36.4 C)  SpO2: 97%    General appearance: Alert, WD/WN, no distress, mildly ill appearing                             Skin: warm, no rash                           Head: no sinus tenderness                            Eyes: conjunctiva normal, corneas clear, PERRLA                                     Nose: mask on              Mouth/throat: mask on                            Neck: supple, no adenopathy                          Heart: RRR                         Lungs: CTA bilaterally, no wheezes, rales, or rhonchi      Assessment: Chills - Plan: POC COVID-19  Acute cough - Plan: POC COVID-19  Acute viral syndrome   Plan: Covid test negative.  Discussed that she could still have Covid but is currently negative.  Suggested symptomatic OTC remedies. Declines Tessalon.  Nasal saline spray for congestion.  Tylenol or Ibuprofen OTC for fever and malaise.  Call/return prn.

## 2022-11-02 DIAGNOSIS — F439 Reaction to severe stress, unspecified: Secondary | ICD-10-CM | POA: Diagnosis not present

## 2022-11-04 DIAGNOSIS — R5383 Other fatigue: Secondary | ICD-10-CM | POA: Diagnosis not present

## 2022-11-04 DIAGNOSIS — R61 Generalized hyperhidrosis: Secondary | ICD-10-CM | POA: Diagnosis not present

## 2022-11-09 DIAGNOSIS — F439 Reaction to severe stress, unspecified: Secondary | ICD-10-CM | POA: Diagnosis not present

## 2022-11-11 ENCOUNTER — Ambulatory Visit
Admission: RE | Admit: 2022-11-11 | Discharge: 2022-11-11 | Disposition: A | Discharge status: Home / Self Care | Payer: BC Managed Care – PPO | Source: Ambulatory Visit | Attending: Family Medicine | Admitting: Family Medicine

## 2022-11-11 DIAGNOSIS — Z1231 Encounter for screening mammogram for malignant neoplasm of breast: Secondary | ICD-10-CM

## 2022-11-23 DIAGNOSIS — F439 Reaction to severe stress, unspecified: Secondary | ICD-10-CM | POA: Diagnosis not present

## 2022-11-30 DIAGNOSIS — F439 Reaction to severe stress, unspecified: Secondary | ICD-10-CM | POA: Diagnosis not present

## 2022-12-06 ENCOUNTER — Encounter (HOSPITAL_COMMUNITY): Payer: Self-pay | Admitting: *Deleted

## 2022-12-06 ENCOUNTER — Ambulatory Visit (HOSPITAL_COMMUNITY)
Admission: EM | Admit: 2022-12-06 | Discharge: 2022-12-06 | Disposition: A | Discharge status: Home / Self Care | Payer: BC Managed Care – PPO

## 2022-12-06 DIAGNOSIS — Z114 Encounter for screening for human immunodeficiency virus [HIV]: Secondary | ICD-10-CM | POA: Diagnosis not present

## 2022-12-06 DIAGNOSIS — N898 Other specified noninflammatory disorders of vagina: Secondary | ICD-10-CM | POA: Insufficient documentation

## 2022-12-06 DIAGNOSIS — B9689 Other specified bacterial agents as the cause of diseases classified elsewhere: Secondary | ICD-10-CM | POA: Diagnosis not present

## 2022-12-06 DIAGNOSIS — Z113 Encounter for screening for infections with a predominantly sexual mode of transmission: Secondary | ICD-10-CM | POA: Diagnosis not present

## 2022-12-06 DIAGNOSIS — M545 Low back pain, unspecified: Secondary | ICD-10-CM | POA: Insufficient documentation

## 2022-12-06 DIAGNOSIS — Z87891 Personal history of nicotine dependence: Secondary | ICD-10-CM | POA: Diagnosis not present

## 2022-12-06 DIAGNOSIS — N3001 Acute cystitis with hematuria: Secondary | ICD-10-CM | POA: Insufficient documentation

## 2022-12-06 LAB — POCT URINALYSIS DIP (MANUAL ENTRY)
Bilirubin, UA: NEGATIVE
Glucose, UA: NEGATIVE mg/dL
Ketones, POC UA: NEGATIVE mg/dL
Nitrite, UA: NEGATIVE
Protein Ur, POC: NEGATIVE mg/dL
Spec Grav, UA: 1.025 (ref 1.010–1.025)
Urobilinogen, UA: 0.2 U/dL
pH, UA: 6 (ref 5.0–8.0)

## 2022-12-06 LAB — HIV ANTIBODY (ROUTINE TESTING W REFLEX): HIV Screen 4th Generation wRfx: NONREACTIVE

## 2022-12-06 MED ORDER — NITROFURANTOIN MONOHYD MACRO 100 MG PO CAPS: 100.0000 mg | ORAL_CAPSULE | Freq: Two times a day (BID) | ORAL | 0 refills | Status: AC

## 2022-12-06 NOTE — Discharge Instructions (Addendum)
Start taking Macrobid twice daily.  Your results will come back over the next few days and someone will call and adjust treatment as needed.  You can alternate between Tylenol and ibuprofen as needed for back pain.  And use a heating pad for pain as well.  If your symptoms persist you can return here for reevaluation.  If you develop severe back pain, abdominal pain, or high fevers please seek immediate medical treatment in the emergency department.

## 2022-12-06 NOTE — ED Provider Notes (Signed)
[facility]    CSN: [id number] Arrival date & time: 12/06/22  1736      History   Chief Complaint Chief Complaint  Patient presents with   Urinary Frequency   Back Pain    HPI Deborah Haney is a 57 y.o. female.   Patient presents with bilateral low back pain that began over the weekend and reports urinary frequency and burning that began today.  Patient also reports more vaginal discharge than usual.  Patient requesting STD testing.   Urinary Frequency  Back Pain Associated symptoms: dysuria   Associated symptoms: no fever and no pelvic pain     Past Medical History:  Diagnosis Date   Acute cholecystitis 07/14/2013   Acute on chronic respiratory failure with hypoxia (HCC) 09/05/2018   Anemia    has history, takes iron   Anxiety    Bipolar affective disorder (HCC) 10/26/2015   Has seen counselor, declined medications per medical record from [facility]. hypermanic   Chest pain 07/14/2013   Chronic vaginitis 10/26/2015   Has h/o vag dryness and tried Estrace per [facility] records   COVID-19 virus infection 08/24/2018   Depression    Diabetes (HCC)    Patient denies   Diverticulitis    no problems   Dysfunctional uterine bleeding 03/02/2011   Elevated hemoglobin A1c 10/26/2015   GERD (gastroesophageal reflux disease)    tums prn   Headache(784.0)    Hemorrhoids    HTN (hypertension)    BP meds since fall 2016   Internal hemorrhoids    Nausea & vomiting 07/14/2013   Obesity 10/26/2015   Perimenopausal symptom 04/30/2021   Rectal bleeding 03/15/2017    Patient Active Problem List   Diagnosis Date Noted   1st degree AV block 10/14/2022   Chronic pain of right lower extremity 10/14/2022   Hot flashes due to menopause 10/14/2022   Diarrhea 01/24/2022   History of sexual behavior with high risk of exposure to communicable disease 01/24/2022   Vaginal discharge 01/24/2022   Anemia 11/16/2021   Diverticulitis 11/16/2021   Gastroesophageal reflux  disease 11/16/2021   Abscess 11/16/2021   Fatigue 11/16/2021   Bloating 07/07/2021   Rectal bleeding 07/07/2021   Irritable bowel syndrome 07/07/2021   H/O: hysterectomy 05/13/2021   Menopausal symptoms 05/13/2021   Elevated blood sugar 12/17/2020   Encounter for general adult medical examination with abnormal findings 12/17/2020   Type 2 diabetes mellitus with hyperglycemia, without long-term current use of insulin (HCC) 12/17/2020   Mixed dyslipidemia 12/17/2020   Situational depression 04/24/2020   Vitamin D deficiency 04/24/2020   Burning with urination 09/25/2019   Urinary frequency 09/25/2019   Gross hematuria 09/25/2019   Internal bleeding hemorrhoids 08/30/2019   Change in bowel habit 08/30/2019   Loose stools 08/30/2019   Paresthesia of both lower extremities 11/14/2018   Elevated LDL cholesterol level 10/17/2018   Proteinuria 10/17/2018   History of 2019 novel coronavirus disease (COVID-19) 09/24/2018   History of pneumonia 09/24/2018   Controlled type 2 diabetes mellitus without complication, without long-term current use of insulin (HCC) 09/11/2018   Acute cough 06/12/2018   Allergic rhinitis due to pollen 06/12/2018   Acute cystitis with hematuria 03/05/2018   Dyslipidemia 06/05/2017   Essential hypertension 10/26/2015   Obesity (BMI 30.0-34.9) 10/26/2015   Chronic vaginitis 10/26/2015   Bipolar affective disorder (HCC) 10/26/2015   Atypical chest pain 07/14/2013   Dysfunctional uterine bleeding 03/02/2011    Past Surgical History:  Procedure Laterality Date  ABDOMINAL HYSTERECTOMY     CHOLECYSTECTOMY N/A 07/15/2013   Procedure: LAPAROSCOPIC CHOLECYSTECTOMY;  Surgeon: Emelia Loron, MD;  Location: [facility] OR;  Service: General;  Laterality: N/A;   COLONOSCOPY     ECTOPIC PREGNANCY SURGERY     laparotomy   HEMORRHOID BANDING     ROBOTIC ASSISTED LAP VAGINAL HYSTERECTOMY      OB History     Gravida  1   Para  0   Term  0   Preterm  0   AB  1    Living  0      SAB  0   IAB  0   Ectopic  1   Multiple  0   Live Births               Home Medications    Prior to Admission medications   Medication Sig Start Date End Date Taking? Authorizing Provider  cholestyramine (QUESTRAN) 4 g packet MIX AND DRINK 1 PACKET DAILY 06/10/22  Yes Zehr, Shanda Bumps D, PA-C  estradiol (VIVELLE-DOT) 0.025 MG/24HR APPLY 1 PATCH TOPICALLY TWICE A WEEK   Yes [provider]  lisinopril-hydrochlorothiazide (ZESTORETIC) 10-12.5 MG tablet Take 1 tablet by mouth once daily 09/05/22  Yes Henson, Vickie L, NP-C  naproxen (NAPROSYN) 375 MG tablet TAKE 1 TABLET BY MOUTH TWICE DAILY FOR 14 DAYS   Yes [provider]  nitrofurantoin, macrocrystal-monohydrate, (MACROBID) 100 MG capsule Take 1 capsule (100 mg total) by mouth 2 (two) times daily for 5 days. 12/06/22 12/11/22 Yes Wynonia Lawman A, NP  Probiotic Product (PROBIOTIC-10 PO) Take 1 mg by mouth 1 day or 1 dose. Pt. Takes every other day   Yes [provider]  Accu-Chek Softclix Lancets lancets Use as instructed;  Check FSBS BID - Include strips # 50 with 5 refills, Lancets #50 with 5 refills DX: Type 2 DM - ICD 10: E11.9 04/16/21   Jake Shark, PA-C  Blood Glucose Monitoring Suppl (CONTOUR MONITOR) w/Device KIT 1 kit by Does not apply route 2 (two) times a day. 07/02/18   Henson, Vickie L, NP-C  CONTOUR NEXT TEST test strip USE 1 STRIP TO CHECK GLUCOSE TWICE DAILY 02/27/20   Henson, Vickie L, NP-C  fluticasone (FLONASE) 50 MCG/ACT nasal spray Place 2 sprays into both nostrils daily. 08/12/22   Henson, Vickie L, NP-C  mupirocin cream (BACTROBAN) 2 % Apply 1 application. topically 2 (two) times daily. 05/13/21   Jake Shark, PA-C    Family History Family History  Problem Relation Age of Onset   Diabetes Mother    Hypertension Mother    Arthritis Father 12   Hypertension Brother    Cancer Brother        blood    Cancer Brother        unsure kind, as a child    CVA Other     Colon cancer Neg Hx    Colon polyps Neg Hx    Esophageal cancer Neg Hx    Rectal cancer Neg Hx    Stomach cancer Neg Hx    Pancreatic cancer Neg Hx     Social History Social History   Tobacco Use   Smoking status: Former    Current packs/day: 0.00    Types: Cigarettes    Quit date: 04/18/2008    Years since quitting: 14.6   Smokeless tobacco: Never  Vaping Use   Vaping status: Never Used  Substance Use Topics   Alcohol use: Yes  Comment: 1 glass of wine rarely, occasionally   Drug use: Yes    Types: Marijuana    Comment: last use this am partial blunt, Former cocaine use since 2012     Allergies   Augmentin [amoxicillin-pot clavulanate]   Review of Systems Review of Systems  Constitutional:  Negative for chills, fatigue and fever.  Genitourinary:  Positive for dysuria, frequency and vaginal discharge. Negative for flank pain, hematuria, pelvic pain, vaginal bleeding and vaginal pain.  Musculoskeletal:  Positive for back pain.     Physical Exam Triage Vital Signs ED Triage Vitals  Encounter Vitals Group     BP 12/06/22 1814 136/87     Systolic BP Percentile --      Diastolic BP Percentile --      Pulse Rate 12/06/22 1814 68     Resp 12/06/22 1814 16     Temp 12/06/22 1814 98.3 F (36.8 C)     Temp Source 12/06/22 1814 Oral     SpO2 12/06/22 1814 97 %     Weight --      Height --      Head Circumference --      Peak Flow --      Pain Score 12/06/22 1812 8     Pain Loc --      Pain Education --      Exclude from Growth Chart --    No data found.  Updated Vital Signs BP 136/87 (BP Location: Left Arm)   Pulse 68   Temp 98.3 F (36.8 C) (Oral)   Resp 16   LMP 04/25/2011   SpO2 97%   Visual Acuity Right Eye Distance:   Left Eye Distance:   Bilateral Distance:    Right Eye Near:   Left Eye Near:    Bilateral Near:     Physical Exam Vitals and nursing note reviewed.  Constitutional:      General: She is awake. She is not in acute  distress.    Appearance: Normal appearance. She is well-developed and well-groomed. She is not ill-appearing, toxic-appearing or diaphoretic.  Abdominal:     General: Abdomen is flat. There is no distension.     Palpations: Abdomen is soft.     Tenderness: There is no abdominal tenderness. There is no right CVA tenderness, left CVA tenderness or guarding.  Genitourinary:    Comments: Exam deferred. Musculoskeletal:     Cervical back: Normal.     Thoracic back: Normal.     Lumbar back: Tenderness present. No swelling, deformity or bony tenderness. Normal range of motion.     Comments: Mild tenderness to bilateral lower back.  Neurological:     Mental Status: She is alert.  Psychiatric:        Behavior: Behavior is cooperative.      UC Treatments / Results  Labs (all labs ordered are listed, but only abnormal results are displayed) Labs Reviewed  POCT URINALYSIS DIP (MANUAL ENTRY) - Abnormal; Notable for the following components:      Result Value   Clarity, UA cloudy (*)    Blood, UA trace-intact (*)    Leukocytes, UA Small (1+) (*)    All other components within normal limits  URINE CULTURE  HIV ANTIBODY (ROUTINE TESTING W REFLEX)  RPR  CERVICOVAGINAL ANCILLARY ONLY    EKG   Radiology No results found.  Procedures Procedures (including critical care time)  Medications Ordered in UC Medications - No data to display  Initial Impression /  Assessment and Plan / UC Course  I have reviewed the triage vital signs and the nursing notes.  Pertinent labs & imaging results that were available during my care of the patient were reviewed by me and considered in my medical decision making (see chart for details).     Patient presented with bilateral low back pain that began over the weekend, and now reports urinary frequency and burning that began today.  Reports more vaginal discharge than usual.  Patient requesting STD testing.  Upon assessment patient has mild  tenderness to bilateral lower back without CVA tenderness.  UA revealed small leukocytes, will send culture.  GU exam deferred.  Patient performed self swab for STD/STI.  HIV and syphilis testing ordered.  Prescribed Macrobid for UTI coverage.  Recommended Tylenol and ibuprofen for back pain.  Discussed follow-up, return, emergency department precautions. Final Clinical Impressions(s) / UC Diagnoses   Final diagnoses:  Acute cystitis with hematuria  Screening for STD (sexually transmitted disease)  Vaginal discharge     Discharge Instructions      Start taking Macrobid twice daily.  Your results will come back over the next few days and someone will call and adjust treatment as needed.  You can alternate between Tylenol and ibuprofen as needed for back pain.  And use a heating pad for pain as well.  If your symptoms persist you can return here for reevaluation.  If you develop severe back pain, abdominal pain, or high fevers please seek immediate medical treatment in the emergency department.    ED Prescriptions     Medication Sig Dispense Auth. Provider   nitrofurantoin, macrocrystal-monohydrate, (MACROBID) 100 MG capsule Take 1 capsule (100 mg total) by mouth 2 (two) times daily for 5 days. 10 capsule Wynonia Lawman A, NP      PDMP not reviewed this encounter.   Wynonia Lawman A, NP 12/06/22 1859

## 2022-12-06 NOTE — ED Triage Notes (Signed)
Pt states over weekend she had some lower back pain and today she has urine frequency and burning.

## 2022-12-07 ENCOUNTER — Telehealth: Payer: Self-pay

## 2022-12-07 ENCOUNTER — Encounter: Payer: Self-pay | Admitting: Family Medicine

## 2022-12-07 LAB — CERVICOVAGINAL ANCILLARY ONLY
Bacterial Vaginitis (gardnerella): POSITIVE — AB
Candida Glabrata: NEGATIVE
Candida Vaginitis: POSITIVE — AB
Chlamydia: NEGATIVE
Comment: NEGATIVE
Comment: NEGATIVE
Comment: NEGATIVE
Comment: NEGATIVE
Comment: NEGATIVE
Comment: NORMAL
Neisseria Gonorrhea: NEGATIVE
Trichomonas: NEGATIVE

## 2022-12-07 LAB — RPR: RPR Ser Ql: NONREACTIVE

## 2022-12-07 MED ORDER — FLUCONAZOLE 150 MG PO TABS: 150.0000 mg | ORAL_TABLET | Freq: Once | ORAL | 0 refills | Status: AC

## 2022-12-07 MED ORDER — METRONIDAZOLE 500 MG PO TABS: 500.0000 mg | ORAL_TABLET | Freq: Two times a day (BID) | ORAL | 0 refills | Status: AC

## 2022-12-07 NOTE — Telephone Encounter (Signed)
Per protocol, pt requires tx with metronidazole and diflucan. Reviewed with patient, verified pharmacy, prescription sent.

## 2022-12-07 NOTE — Telephone Encounter (Signed)
Please advise if this is ok.  

## 2022-12-09 LAB — URINE CULTURE: Culture: >=100000 — AB

## 2022-12-15 ENCOUNTER — Encounter: Payer: Self-pay | Admitting: Internal Medicine

## 2022-12-15 ENCOUNTER — Ambulatory Visit: Payer: BC Managed Care – PPO | Attending: Internal Medicine | Admitting: Internal Medicine

## 2022-12-15 VITALS — BP 152/96 | HR 70 | Ht 66.0 in | Wt 185.4 lb

## 2022-12-15 DIAGNOSIS — R0789 Other chest pain: Secondary | ICD-10-CM

## 2022-12-15 NOTE — Patient Instructions (Addendum)
Medication Instructions:  NO CHANGES   *If you need a refill on your cardiac medications before your next appointment, please call your pharmacy*   Lab Work:  NONE  If you have labs (blood work) drawn today and your tests are completely normal, you will receive your results only by: MyChart Message (if you have MyChart) OR A paper copy in the mail If you have any lab test that is abnormal or we need to change your treatment, we will call you to review the results.   Testing/Procedures: NONE   Follow-Up: At [facility], you and your health needs are our priority.  As part of our continuing mission to provide you with exceptional heart care, we have created designated Provider Care Teams.  These Care Teams include your primary Cardiologist (physician) and Advanced Practice Providers (APPs -  Physician Assistants and Nurse Practitioners) who all work together to provide you with the care you need, when you need it.  We recommend signing up for the patient portal called "MyChart".  Sign up information is provided on this After Visit Summary.  MyChart is used to connect with patients for Virtual Visits (Telemedicine).  Patients are able to view lab/test results, encounter notes, upcoming appointments, etc.  Non-urgent messages can be sent to your provider as well.   To learn more about what you can do with MyChart, go to [email or web address].    Your next appointment:   Follow up as needed   Provider:   Maisie Fus, MD   Other Instructions  [facility]:

## 2022-12-15 NOTE — Progress Notes (Signed)
Cardiology Office Note:  .   Date:  12/15/2022  ID:  Deborah Haney, DOB [birth year], MRN [id number] PCP: Avanell Shackleton, NP-C  [facility] Providers Cardiologist:  None    History of Present Illness: .   Deborah Haney is a 57 y.o. female , no CVD hx. She saw Dr. Dulce Sellar in 2019 for atypical, non cardiac chest pain. The echo stress was negative for ischemia. She returns to [facility] today for CP. She had a coronary artery calcium score which was zero 01/07/2021. She was found to have a 1st degree AV block and was referred back to cardiology. On 10/1, she was seen in the ED for low back pain. She comes in today for an evaluation of her first-degree AV block.  She notes chest pain/pressure BL. Notes wearing a sports bra helps. She notes at home 130s SBP  ROS:  per HPI otherwise negative   Studies Reviewed: Marland Kitchen   EKG Interpretation Date/Time:  Thursday December 15 2022 13:11:01 EDT Ventricular Rate:  70 PR Interval:  232 QRS Duration:  98 QT Interval:  406 QTC Calculation: 438 R Axis:   69  Text Interpretation: Sinus rhythm with 1st degree A-V block Minimal voltage criteria for LVH, may be normal variant ( Sokolow-Lyon ) When compared with ECG of 01-Oct-2022 13:47, No significant change was found Confirmed by Carolan Clines [phone]) on 12/15/2022 1:15:06 PM    EKG Interpretation Date/Time:  Thursday December 15 2022 13:11:01 EDT Ventricular Rate:  70 PR Interval:  232 QRS Duration:  98 QT Interval:  406 QTC Calculation: 438 R Axis:   69  Text Interpretation: Sinus rhythm with 1st degree A-V block Minimal voltage criteria for LVH, may be normal variant ( Sokolow-Lyon ) When compared with ECG of 01-Oct-2022 13:47, No significant change was found Confirmed by Carolan Clines ([id number]) on 12/15/2022 1:15:06 PM  Risk Assessment/Calculations:    Physical Exam:   VS:  Vitals:   12/15/22 1312  BP: (!) 152/96  Pulse: 70  SpO2: 98%     BP (!) 152/96 (BP Location: Left Arm, Patient Position:  Sitting, Cuff Size: Normal)   Pulse 70   Ht 5\' 6"  (1.676 m)   Wt 185 lb 6.4 oz (84.1 kg)   LMP 04/25/2011   SpO2 98%   BMI 29.92 kg/m     Wt Readings from Last 3 Encounters:  12/15/22 185 lb 6.4 oz (84.1 kg)  10/28/22 185 lb (83.9 kg)  10/14/22 186 lb (84.4 kg)    GEN: Well nourished, well developed in no acute distress NECK: No JVD CARDIAC: RRR, no murmurs, rubs, gallops RESPIRATORY:  Clear to auscultation without rales, wheezing or rhonchi  ABDOMEN: Soft, non-tender, non-distended EXTREMITIES:  No edema; No deformity   ASSESSMENT AND PLAN: .   1st degree AV block -this is very benign. She has no limitations with medical therapies. No surveillance is required.    Non cardiac CP SPECT this is musculoskeletal in nature.  She has had a prior negative ischemic workup in the past for similar symptoms.    HTN Her EKG shows minimal changes to suggest LVH.  Pressure is elevated today 152/96 mmHg. Notes BP at home are better improved. Discussed walking and low salt diet.   Dispo: Follow up PRN  Signed, Maisie Fus, MD

## 2022-12-27 DIAGNOSIS — F314 Bipolar disorder, current episode depressed, severe, without psychotic features: Secondary | ICD-10-CM | POA: Diagnosis not present

## 2023-01-09 DIAGNOSIS — F439 Reaction to severe stress, unspecified: Secondary | ICD-10-CM | POA: Diagnosis not present

## 2023-01-11 DIAGNOSIS — F314 Bipolar disorder, current episode depressed, severe, without psychotic features: Secondary | ICD-10-CM | POA: Diagnosis not present

## 2023-01-17 DIAGNOSIS — F439 Reaction to severe stress, unspecified: Secondary | ICD-10-CM | POA: Diagnosis not present

## 2023-01-19 DIAGNOSIS — F314 Bipolar disorder, current episode depressed, severe, without psychotic features: Secondary | ICD-10-CM | POA: Diagnosis not present

## 2023-01-23 ENCOUNTER — Encounter: Payer: Self-pay | Admitting: Family Medicine

## 2023-01-23 DIAGNOSIS — F1491 Cocaine use, unspecified, in remission: Secondary | ICD-10-CM | POA: Diagnosis not present

## 2023-01-23 DIAGNOSIS — F314 Bipolar disorder, current episode depressed, severe, without psychotic features: Secondary | ICD-10-CM | POA: Diagnosis not present

## 2023-01-23 NOTE — Telephone Encounter (Signed)
 Care team updated and letter sent for eye exam notes.

## 2023-01-24 DIAGNOSIS — F439 Reaction to severe stress, unspecified: Secondary | ICD-10-CM | POA: Diagnosis not present

## 2023-01-26 DIAGNOSIS — F314 Bipolar disorder, current episode depressed, severe, without psychotic features: Secondary | ICD-10-CM | POA: Diagnosis not present

## 2023-01-26 DIAGNOSIS — F1491 Cocaine use, unspecified, in remission: Secondary | ICD-10-CM | POA: Diagnosis not present

## 2023-01-30 DIAGNOSIS — F439 Reaction to severe stress, unspecified: Secondary | ICD-10-CM | POA: Diagnosis not present

## 2023-02-06 DIAGNOSIS — F314 Bipolar disorder, current episode depressed, severe, without psychotic features: Secondary | ICD-10-CM | POA: Diagnosis not present

## 2023-02-07 DIAGNOSIS — F439 Reaction to severe stress, unspecified: Secondary | ICD-10-CM | POA: Diagnosis not present

## 2023-02-08 ENCOUNTER — Other Ambulatory Visit: Payer: Self-pay | Admitting: Family Medicine

## 2023-02-09 DIAGNOSIS — F314 Bipolar disorder, current episode depressed, severe, without psychotic features: Secondary | ICD-10-CM | POA: Diagnosis not present

## 2023-02-09 DIAGNOSIS — F1491 Cocaine use, unspecified, in remission: Secondary | ICD-10-CM | POA: Diagnosis not present

## 2023-02-09 MED ORDER — CONTOUR NEXT TEST VI STRP: ORAL_STRIP | 0 refills | Status: AC

## 2023-02-09 NOTE — Telephone Encounter (Signed)
Pt reports [facility] go to [facility]

## 2023-02-09 NOTE — Telephone Encounter (Signed)
This will be her 1st fill with you

## 2023-02-16 DIAGNOSIS — F1491 Cocaine use, unspecified, in remission: Secondary | ICD-10-CM | POA: Diagnosis not present

## 2023-02-16 DIAGNOSIS — F314 Bipolar disorder, current episode depressed, severe, without psychotic features: Secondary | ICD-10-CM | POA: Diagnosis not present

## 2023-02-17 ENCOUNTER — Ambulatory Visit: Payer: BC Managed Care – PPO | Admitting: Family Medicine

## 2023-02-17 ENCOUNTER — Encounter: Payer: Self-pay | Admitting: Family Medicine

## 2023-02-17 VITALS — BP 130/84 | HR 70 | Temp 97.6°F | Ht 66.0 in | Wt 188.0 lb

## 2023-02-17 DIAGNOSIS — R233 Spontaneous ecchymoses: Secondary | ICD-10-CM | POA: Diagnosis not present

## 2023-02-17 DIAGNOSIS — E119 Type 2 diabetes mellitus without complications: Secondary | ICD-10-CM

## 2023-02-17 DIAGNOSIS — I10 Essential (primary) hypertension: Secondary | ICD-10-CM

## 2023-02-17 DIAGNOSIS — M79671 Pain in right foot: Secondary | ICD-10-CM | POA: Diagnosis not present

## 2023-02-17 DIAGNOSIS — F319 Bipolar disorder, unspecified: Secondary | ICD-10-CM | POA: Diagnosis not present

## 2023-02-17 DIAGNOSIS — R5383 Other fatigue: Secondary | ICD-10-CM | POA: Diagnosis not present

## 2023-02-17 DIAGNOSIS — R21 Rash and other nonspecific skin eruption: Secondary | ICD-10-CM

## 2023-02-17 DIAGNOSIS — L819 Disorder of pigmentation, unspecified: Secondary | ICD-10-CM

## 2023-02-17 DIAGNOSIS — M79672 Pain in left foot: Secondary | ICD-10-CM

## 2023-02-17 LAB — VITAMIN B12: Vitamin B-12: 335 pg/mL (ref 211–911)

## 2023-02-17 LAB — CBC WITH DIFFERENTIAL/PLATELET
Basophils Absolute: 0.1 10*3/uL (ref 0.0–0.1)
Basophils Relative: 0.9 % (ref 0.0–3.0)
Eosinophils Absolute: 0.2 10*3/uL (ref 0.0–0.7)
Eosinophils Relative: 2.6 % (ref 0.0–5.0)
HCT: 38.1 % (ref 36.0–46.0)
Hemoglobin: 12.9 g/dL (ref 12.0–15.0)
Lymphocytes Relative: 48.5 % — ABNORMAL HIGH (ref 12.0–46.0)
Lymphs Abs: 3.2 10*3/uL (ref 0.7–4.0)
MCHC: 33.8 g/dL (ref 30.0–36.0)
MCV: 90.6 fL (ref 78.0–100.0)
Monocytes Absolute: 0.5 10*3/uL (ref 0.1–1.0)
Monocytes Relative: 7.4 % (ref 3.0–12.0)
Neutro Abs: 2.6 10*3/uL (ref 1.4–7.7)
Neutrophils Relative %: 40.6 % — ABNORMAL LOW (ref 43.0–77.0)
Platelets: 276 10*3/uL (ref 150.0–400.0)
RBC: 4.2 Mil/uL (ref 3.87–5.11)
RDW: 15.7 % — ABNORMAL HIGH (ref 11.5–15.5)
WBC: 6.5 10*3/uL (ref 4.0–10.5)

## 2023-02-17 LAB — FOLATE: Folate: 17.9 ng/mL (ref >5.9)

## 2023-02-17 LAB — COMPREHENSIVE METABOLIC PANEL
ALT: 13 U/L (ref 0–35)
AST: 14 U/L (ref 0–37)
Albumin: 4.1 g/dL (ref 3.5–5.2)
Alkaline Phosphatase: 61 U/L (ref 39–117)
BUN: 12 mg/dL (ref 6–23)
CO2: 30 meq/L (ref 19–32)
Calcium: 9.1 mg/dL (ref 8.4–10.5)
Chloride: 103 meq/L (ref 96–112)
Creatinine, Ser: 0.85 mg/dL (ref 0.40–1.20)
GFR: 77.21 mL/min (ref >60.00)
Glucose, Bld: 96 mg/dL (ref 70–99)
Potassium: 3.8 meq/L (ref 3.5–5.1)
Sodium: 141 meq/L (ref 135–145)
Total Bilirubin: 0.5 mg/dL (ref 0.2–1.2)
Total Protein: 7.6 g/dL (ref 6.0–8.3)

## 2023-02-17 LAB — PROTIME-INR
INR: 1.1 {ratio} — ABNORMAL HIGH (ref 0.8–1.0)
Prothrombin Time: 11.7 s (ref 9.6–13.1)

## 2023-02-17 LAB — VITAMIN D 25 HYDROXY (VIT D DEFICIENCY, FRACTURES): VITD: 24.53 ng/mL — ABNORMAL LOW (ref 30.00–100.00)

## 2023-02-17 LAB — MICROALBUMIN / CREATININE URINE RATIO
Creatinine,U: 176.7 mg/dL
Microalb Creat Ratio: 0.8 mg/g (ref 0.0–30.0)
Microalb, Ur: 1.4 mg/dL (ref 0.0–1.9)

## 2023-02-17 LAB — FERRITIN: Ferritin: 30.4 ng/mL (ref 10.0–291.0)

## 2023-02-17 LAB — T4, FREE: Free T4: 0.78 ng/dL (ref 0.60–1.60)

## 2023-02-17 LAB — TSH: TSH: 1.44 u[IU]/mL (ref 0.35–5.50)

## 2023-02-17 LAB — HEMOGLOBIN A1C: Hgb A1c MFr Bld: 6.4 % (ref 4.6–6.5)

## 2023-02-17 NOTE — Patient Instructions (Signed)
Please go downstairs for labs and a urine protein test.   I referred you to [facility] [place] and they should call you.   Schedule your diabetes eye exam.   I will be in touch with your results.

## 2023-02-17 NOTE — Progress Notes (Unsigned)
Subjective:     Patient ID: Deborah Haney, female    DOB: June 12, 1965, 57 y.o.   MRN: [id number]  Chief Complaint  Patient presents with   Rash    Rash across chest, unsure of when started but first noticed 2 months. Itches sometimes   Nail Problem    Toenails turning black and starting to have some toe pain as well   Fatigue    HPI   History of Present Illness         She has several concerns.   2 month hx of pruritic rash on her chest. Improving. Dry skin. She has not put anything on the area.   States her toes on both feet are turning dark. She occasionally has burning in her feet.  No numbness, tingling or rash.   C/o fatigue and brain fog. Easily bruising.  Concern regarding brother with blood cancer.   DM- She has been checking her BS and readings in 80s-119   Started a new medication for bipolar disorder- Abilify    Health Maintenance Due  Topic Date Due   Zoster Vaccines- Shingrix (1 of 2) Never done   OPHTHALMOLOGY EXAM  02/23/2022    Past Medical History:  Diagnosis Date   Acute cholecystitis 07/14/2013   Acute on chronic respiratory failure with hypoxia (HCC) 09/05/2018   Anemia    has history, takes iron   Anxiety    Bipolar affective disorder (HCC) 10/26/2015   Has seen counselor, declined medications per medical record from [facility]. hypermanic   Chest pain 07/14/2013   Chronic vaginitis 10/26/2015   Has h/o vag dryness and tried Estrace per [place] records   COVID-19 virus infection 08/24/2018   Depression    Diabetes (HCC)    Patient denies   Diverticulitis    no problems   Dysfunctional uterine bleeding 03/02/2011   Elevated hemoglobin A1c 10/26/2015   GERD (gastroesophageal reflux disease)    tums prn   Headache(784.0)    Hemorrhoids    HTN (hypertension)    BP meds since fall 2016   Internal hemorrhoids    Nausea & vomiting 07/14/2013   Obesity 10/26/2015   Perimenopausal symptom 04/30/2021   Rectal bleeding 03/15/2017    Past  Surgical History:  Procedure Laterality Date   ABDOMINAL HYSTERECTOMY     CHOLECYSTECTOMY N/A 07/15/2013   Procedure: LAPAROSCOPIC CHOLECYSTECTOMY;  Surgeon: Emelia Loron, MD;  Location: [facility] OR;  Service: General;  Laterality: N/A;   COLONOSCOPY     ECTOPIC PREGNANCY SURGERY     laparotomy   HEMORRHOID BANDING     ROBOTIC ASSISTED LAP VAGINAL HYSTERECTOMY      Family History  Problem Relation Age of Onset   Diabetes Mother    Hypertension Mother    Arthritis Father 41   Hypertension Brother    Cancer Brother        blood    Cancer Brother        unsure kind, as a child    CVA Other    Colon cancer Neg Hx    Colon polyps Neg Hx    Esophageal cancer Neg Hx    Rectal cancer Neg Hx    Stomach cancer Neg Hx    Pancreatic cancer Neg Hx     Social History   Socioeconomic History   Marital status: Divorced    Spouse name: Not on file   Number of children: 0   Years of education: Not on file   Highest  education level: Not on file  Occupational History   Occupation: Child psychotherapist, Dealer  Tobacco Use   Smoking status: Former    Current packs/day: 0.00    Types: Cigarettes    Quit date: 04/18/2008    Years since quitting: 14.8   Smokeless tobacco: Never  Vaping Use   Vaping status: Never Used  Substance and Sexual Activity   Alcohol use: Yes    Comment: 1 glass of wine rarely, occasionally   Drug use: Yes    Types: Marijuana    Comment: last use this am partial blunt, Former cocaine use since 2012   Sexual activity: Yes    Partners: Male    Birth control/protection: Surgical  Other Topics Concern   Not on file  Social History Narrative   Lives alone.     No children, no pets.   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   Social Drivers of Corporate investment banker Strain: Not on file  Food Insecurity: Not on file  Transportation Needs: Not on file  Physical Activity: Unknown (08/31/2018)   Exercise Vital Sign     Days of Exercise per Week: Patient declined    Minutes of Exercise per Session: Patient declined  Stress: Not on file  Social Connections: Unknown (08/31/2018)   Social Connection and Isolation Panel [NHANES]    Frequency of Communication with Friends and Family: Patient declined    Frequency of Social Gatherings with Friends and Family: Patient declined    Attends Religious Services: Patient declined    Database administrator or Organizations: Patient declined    Attends Banker Meetings: Patient declined    Marital Status: Patient declined  Intimate Partner Violence: Unknown (08/31/2018)   Humiliation, Afraid, Rape, and Kick questionnaire    Fear of Current or Ex-Partner: Patient declined    Emotionally Abused: Patient declined    Physically Abused: Patient declined    Sexually Abused: Patient declined    Outpatient Medications Prior to Visit  Medication Sig Dispense Refill   Accu-Chek Softclix Lancets lancets Use as instructed;  Check FSBS BID - Include strips # 50 with 5 refills, Lancets #50 with 5 refills DX: Type 2 DM - ICD 10: E11.9 100 each 12   ARIPiprazole (ABILIFY) 5 MG tablet Take 5 mg by mouth daily.     Blood Glucose Monitoring Suppl (CONTOUR MONITOR) w/Device KIT 1 kit by Does not apply route 2 (two) times a day. 1 kit 0   cholestyramine (QUESTRAN) 4 g packet MIX AND DRINK 1 PACKET DAILY 90 packet 0   estradiol (VIVELLE-DOT) 0.025 MG/24HR APPLY 1 PATCH TOPICALLY TWICE A WEEK     fluticasone (FLONASE) 50 MCG/ACT nasal spray Place 2 sprays into both nostrils daily. 16 g 6   glucose blood (CONTOUR NEXT TEST) test strip Use as instructed 100 each 0   lisinopril-hydrochlorothiazide (ZESTORETIC) 10-12.5 MG tablet Take 1 tablet by mouth once daily 90 tablet 0   mupirocin cream (BACTROBAN) 2 % Apply 1 application. topically 2 (two) times daily. 15 g 1   naproxen (NAPROSYN) 375 MG tablet TAKE 1 TABLET BY MOUTH TWICE DAILY FOR 14 DAYS     Probiotic Product (PROBIOTIC-10  PO) Take 1 mg by mouth 1 day or 1 dose. Pt. Takes every other day     No facility-administered medications prior to visit.    Allergies  Allergen Reactions   Augmentin [Amoxicillin-Pot Clavulanate] Rash    Review of Systems  Constitutional:  Positive for malaise/fatigue. Negative for chills, fever and weight loss.  HENT:  Negative for congestion and sore throat.   Eyes:  Negative for blurred vision and double vision.  Respiratory:  Negative for cough and shortness of breath.   Cardiovascular:  Negative for chest pain, palpitations and leg swelling.  Gastrointestinal:  Negative for abdominal pain, constipation, diarrhea, nausea and vomiting.  Genitourinary:  Negative for dysuria, frequency and urgency.  Neurological:  Negative for dizziness, tingling, focal weakness and headaches.  Endo/Heme/Allergies:  Negative for polydipsia. Bruises/bleeds easily.       Objective:    Physical Exam Constitutional:      General: She is not in acute distress.    Appearance: She is not ill-appearing.  Eyes:     Extraocular Movements: Extraocular movements intact.     Conjunctiva/sclera: Conjunctivae normal.  Cardiovascular:     Rate and Rhythm: Normal rate.  Pulmonary:     Effort: Pulmonary effort is normal.  Musculoskeletal:     Cervical back: Normal range of motion and neck supple.     Right lower leg: No edema.     Left lower leg: No edema.     Right foot: Normal range of motion and normal capillary refill. No swelling or tenderness. Normal pulse.     Left foot: Normal range of motion and normal capillary refill. No swelling or tenderness. Normal pulse.     Comments: Hyperpigmented skin proximal to toenails. Skin intact. Normal sensation including to monofilament.   Skin:    General: Skin is warm and dry.     Findings: Bruising present.     Comments: Dry patches on chest wall. No erythema, scaling or peeling.  Purplish bruising noted along ankle (shoe line)   Neurological:      General: No focal deficit present.     Mental Status: She is alert and oriented to person, place, and time.     Motor: No weakness.     Coordination: Coordination normal.     Gait: Gait normal.  Psychiatric:        Mood and Affect: Mood normal.        Behavior: Behavior normal.        Thought Content: Thought content normal.      BP 130/84 (BP Location: Left Arm, Patient Position: Sitting, Cuff Size: Large)   Pulse 70   Temp 97.6 F (36.4 C) (Temporal)   Ht 5\' 6"  (1.676 m)   Wt 188 lb (85.3 kg)   LMP 04/25/2011   SpO2 98%   BMI 30.34 kg/m  Wt Readings from Last 3 Encounters:  02/17/23 188 lb (85.3 kg)  12/15/22 185 lb 6.4 oz (84.1 kg)  10/28/22 185 lb (83.9 kg)       Assessment & Plan:   Problem List Items Addressed This Visit     Bipolar affective disorder (HCC)   Recently started on Abilify. Continue close follow up with psychiatrist and counselor      Controlled type 2 diabetes mellitus without complication, without long-term current use of insulin (HCC)   Diet controlled. Check A1c and renal function       Relevant Orders   CBC with Differential/Platelet (Completed)   Comprehensive metabolic panel (Completed)   Hemoglobin A1c (Completed)   Ambulatory referral to Podiatry   Microalbumin / creatinine urine ratio (Completed)   Easy bruising   Check CBC      Relevant Orders   CBC with Differential/Platelet (Completed)   Protime-INR (Completed)  Essential hypertension   Close to goal. Continue current medications and low sodium diet. Check renal function. Monitor closely at home.       Relevant Orders   CBC with Differential/Platelet (Completed)   Comprehensive metabolic panel (Completed)   Fatigue - Primary   Look for underlying etiology. No red flag symptoms. Follow up pending results. May be due to new psych medication.       Relevant Orders   CBC with Differential/Platelet (Completed)   Comprehensive metabolic panel (Completed)   Vitamin B12  (Completed)   VITAMIN D 25 Hydroxy (Vit-D Deficiency, Fractures) (Completed)   T4, free (Completed)   TSH (Completed)   Ferritin (Completed)   Folate (Completed)   Foot pain, bilateral   Referral to podiatry. Prefers to hold off on medication such as gabapentin. Diabetes controlled.       Relevant Orders   Ambulatory referral to Podiatry   Hyperpigmentation   Referral to podiatry for discoloration of toes with otherwise normal foot exam.       Relevant Orders   CBC with Differential/Platelet (Completed)   Comprehensive metabolic panel (Completed)   Ferritin (Completed)   Ambulatory referral to Podiatry   Rash and nonspecific skin eruption   Try antifungal on chest wall. Follow up if worsening or no improvement in 2 weeks.        I am having Keydi N. Losurdo maintain her Contour Monitor, Probiotic Product (PROBIOTIC-10 PO), Accu-Chek Softclix Lancets, mupirocin cream, cholestyramine, fluticasone, lisinopril-hydrochlorothiazide, estradiol, naproxen, Contour Next Test, and ARIPiprazole.  No orders of the defined types were placed in this encounter.

## 2023-02-19 ENCOUNTER — Other Ambulatory Visit: Payer: Self-pay | Admitting: Family Medicine

## 2023-02-19 DIAGNOSIS — E559 Vitamin D deficiency, unspecified: Secondary | ICD-10-CM

## 2023-02-19 MED ORDER — VITAMIN D (ERGOCALCIFEROL) 1.25 MG (50000 UNIT) PO CAPS
50000.0000 [IU] | ORAL_CAPSULE | ORAL | 0 refills | Status: DC
Start: 2023-02-19 — End: 2023-12-20

## 2023-02-19 NOTE — Assessment & Plan Note (Signed)
Referral to podiatry. Prefers to hold off on medication such as gabapentin. Diabetes controlled.

## 2023-02-19 NOTE — Assessment & Plan Note (Addendum)
Try antifungal on chest wall. Follow up if worsening or no improvement in 2 weeks.

## 2023-02-19 NOTE — Assessment & Plan Note (Signed)
Close to goal. Continue current medications and low sodium diet. Check renal function. Monitor closely at home.

## 2023-02-19 NOTE — Assessment & Plan Note (Signed)
Referral to podiatry for discoloration of toes with otherwise normal foot exam.

## 2023-02-19 NOTE — Assessment & Plan Note (Signed)
Check CBC 

## 2023-02-19 NOTE — Assessment & Plan Note (Signed)
Diet controlled. Check A1c and renal function

## 2023-02-19 NOTE — Assessment & Plan Note (Signed)
Recently started on Abilify. Continue close follow up with psychiatrist and counselor

## 2023-02-19 NOTE — Assessment & Plan Note (Signed)
Look for underlying etiology. No red flag symptoms. Follow up pending results. May be due to new psych medication.

## 2023-02-23 ENCOUNTER — Other Ambulatory Visit: Payer: Self-pay | Admitting: Family Medicine

## 2023-02-23 DIAGNOSIS — F1491 Cocaine use, unspecified, in remission: Secondary | ICD-10-CM | POA: Diagnosis not present

## 2023-02-23 DIAGNOSIS — I10 Essential (primary) hypertension: Secondary | ICD-10-CM

## 2023-02-23 DIAGNOSIS — F314 Bipolar disorder, current episode depressed, severe, without psychotic features: Secondary | ICD-10-CM | POA: Diagnosis not present

## 2023-03-06 ENCOUNTER — Encounter: Payer: Self-pay | Admitting: Family Medicine

## 2023-03-07 DIAGNOSIS — F1491 Cocaine use, unspecified, in remission: Secondary | ICD-10-CM | POA: Diagnosis not present

## 2023-03-07 DIAGNOSIS — F314 Bipolar disorder, current episode depressed, severe, without psychotic features: Secondary | ICD-10-CM | POA: Diagnosis not present

## 2023-03-09 DIAGNOSIS — F3132 Bipolar disorder, current episode depressed, moderate: Secondary | ICD-10-CM | POA: Diagnosis not present

## 2023-03-13 ENCOUNTER — Encounter: Payer: BC Managed Care – PPO | Admitting: Podiatry

## 2023-03-13 NOTE — Progress Notes (Signed)
 Patient was originally scheduled for an appointment this morning, however due to the increment weather, the patient was contacted to reschedule and this has yet to be completed

## 2023-03-21 DIAGNOSIS — F3132 Bipolar disorder, current episode depressed, moderate: Secondary | ICD-10-CM | POA: Diagnosis not present

## 2023-03-21 DIAGNOSIS — F314 Bipolar disorder, current episode depressed, severe, without psychotic features: Secondary | ICD-10-CM | POA: Diagnosis not present

## 2023-03-21 DIAGNOSIS — F1491 Cocaine use, unspecified, in remission: Secondary | ICD-10-CM | POA: Diagnosis not present

## 2023-03-27 ENCOUNTER — Ambulatory Visit: Payer: BC Managed Care – PPO | Admitting: Podiatry

## 2023-04-04 DIAGNOSIS — F314 Bipolar disorder, current episode depressed, severe, without psychotic features: Secondary | ICD-10-CM | POA: Diagnosis not present

## 2023-04-04 DIAGNOSIS — F1491 Cocaine use, unspecified, in remission: Secondary | ICD-10-CM | POA: Diagnosis not present

## 2023-04-04 DIAGNOSIS — F3132 Bipolar disorder, current episode depressed, moderate: Secondary | ICD-10-CM | POA: Diagnosis not present

## 2023-04-06 DIAGNOSIS — F319 Bipolar disorder, unspecified: Secondary | ICD-10-CM | POA: Diagnosis not present

## 2023-04-12 ENCOUNTER — Ambulatory Visit: Payer: BC Managed Care – PPO | Admitting: Podiatry

## 2023-04-12 DIAGNOSIS — E1140 Type 2 diabetes mellitus with diabetic neuropathy, unspecified: Secondary | ICD-10-CM | POA: Diagnosis not present

## 2023-04-12 DIAGNOSIS — M779 Enthesopathy, unspecified: Secondary | ICD-10-CM

## 2023-04-12 MED ORDER — GABAPENTIN 100 MG PO CAPS: 100.0000 mg | ORAL_CAPSULE | Freq: Three times a day (TID) | ORAL | 3 refills | Status: DC

## 2023-04-12 NOTE — Progress Notes (Signed)
 Subjective:   Patient ID: Deborah Haney, female   DOB: 58 y.o.   MRN: [id number]   HPI Patient presents stating that she has been getting quite a bit of burning and tingling most in her big toe but also several other digits of both feet.  She has had longstanding diabetes for a number of years and in the beginning was not under control and is in much better control now diet controlled A1c in the mid sixes range.  Patient does not currently smoke likes to be active   Review of Systems  All other systems reviewed and are negative.       Objective:  Physical Exam Vitals and nursing note reviewed.  Constitutional:      Appearance: She is well-developed.  Pulmonary:     Effort: Pulmonary effort is normal.  Musculoskeletal:        General: Normal range of motion.  Skin:    General: Skin is warm.  Neurological:     Mental Status: She is alert.     Vascular status intact sharp dull vibratory intact but upon deep questioning appears to have some form of subtle neuropathy especially affecting big toe second toes of both feet with possible inflammation of the joint surface mild in nature.  Patient states the pain seems to be worse at night but it can occur also during the day and at times can be bothersome to sleep.  Good digital perfusion well-oriented     Assessment:  Probability for low-grade diabetic neuropathy bilateral with also the possibility of mild arthritis capsulitis     Plan:  H&P all conditions reviewed discussed.  At this point I do not think neurologist will be of benefit and we may need to do nerve biopsies that I educated her on.  I am going to start her on low-dose gabapentin  and I did educate her on how to take this reviewing this at great length and patient will be placed on gabapentin  100 mg starting 1 at night and if does well can add 1 morning midday.  Reappoint in several months as needed and instructed on possible nerve biopsies in the future with education today  on daily foot inspections and continued good control of diabetes

## 2023-04-18 DIAGNOSIS — F3132 Bipolar disorder, current episode depressed, moderate: Secondary | ICD-10-CM | POA: Diagnosis not present

## 2023-04-18 DIAGNOSIS — F314 Bipolar disorder, current episode depressed, severe, without psychotic features: Secondary | ICD-10-CM | POA: Diagnosis not present

## 2023-04-18 DIAGNOSIS — F1491 Cocaine use, unspecified, in remission: Secondary | ICD-10-CM | POA: Diagnosis not present

## 2023-04-26 ENCOUNTER — Other Ambulatory Visit: Payer: Self-pay | Admitting: *Deleted

## 2023-04-27 ENCOUNTER — Other Ambulatory Visit: Payer: Self-pay | Admitting: *Deleted

## 2023-05-03 ENCOUNTER — Ambulatory Visit (INDEPENDENT_AMBULATORY_CARE_PROVIDER_SITE_OTHER): Payer: BC Managed Care – PPO

## 2023-05-03 DIAGNOSIS — Z23 Encounter for immunization: Secondary | ICD-10-CM

## 2023-05-03 NOTE — Progress Notes (Signed)
 Patient visits today to receive her Regular Flu vaccine. Patient was informed and tolerated well. Patient was notified to reach out to [place] if needed.

## 2023-05-04 DIAGNOSIS — F1491 Cocaine use, unspecified, in remission: Secondary | ICD-10-CM | POA: Diagnosis not present

## 2023-05-04 DIAGNOSIS — F314 Bipolar disorder, current episode depressed, severe, without psychotic features: Secondary | ICD-10-CM | POA: Diagnosis not present

## 2023-05-04 DIAGNOSIS — F3132 Bipolar disorder, current episode depressed, moderate: Secondary | ICD-10-CM | POA: Diagnosis not present

## 2023-05-04 DIAGNOSIS — F310 Bipolar disorder, current episode hypomanic: Secondary | ICD-10-CM | POA: Diagnosis not present

## 2023-05-09 DIAGNOSIS — F310 Bipolar disorder, current episode hypomanic: Secondary | ICD-10-CM | POA: Diagnosis not present

## 2023-05-09 DIAGNOSIS — F1491 Cocaine use, unspecified, in remission: Secondary | ICD-10-CM | POA: Diagnosis not present

## 2023-05-17 DIAGNOSIS — F1491 Cocaine use, unspecified, in remission: Secondary | ICD-10-CM | POA: Diagnosis not present

## 2023-05-17 DIAGNOSIS — F439 Reaction to severe stress, unspecified: Secondary | ICD-10-CM | POA: Diagnosis not present

## 2023-05-17 DIAGNOSIS — F310 Bipolar disorder, current episode hypomanic: Secondary | ICD-10-CM | POA: Diagnosis not present

## 2023-05-23 DIAGNOSIS — F439 Reaction to severe stress, unspecified: Secondary | ICD-10-CM | POA: Diagnosis not present

## 2023-05-30 DIAGNOSIS — F439 Reaction to severe stress, unspecified: Secondary | ICD-10-CM | POA: Diagnosis not present

## 2023-05-31 DIAGNOSIS — F1491 Cocaine use, unspecified, in remission: Secondary | ICD-10-CM | POA: Diagnosis not present

## 2023-05-31 DIAGNOSIS — F310 Bipolar disorder, current episode hypomanic: Secondary | ICD-10-CM | POA: Diagnosis not present

## 2023-06-06 DIAGNOSIS — F439 Reaction to severe stress, unspecified: Secondary | ICD-10-CM | POA: Diagnosis not present

## 2023-06-12 DIAGNOSIS — F310 Bipolar disorder, current episode hypomanic: Secondary | ICD-10-CM | POA: Diagnosis not present

## 2023-06-14 DIAGNOSIS — F1491 Cocaine use, unspecified, in remission: Secondary | ICD-10-CM | POA: Diagnosis not present

## 2023-06-14 DIAGNOSIS — R61 Generalized hyperhidrosis: Secondary | ICD-10-CM | POA: Diagnosis not present

## 2023-06-14 DIAGNOSIS — F310 Bipolar disorder, current episode hypomanic: Secondary | ICD-10-CM | POA: Diagnosis not present

## 2023-06-27 DIAGNOSIS — F439 Reaction to severe stress, unspecified: Secondary | ICD-10-CM | POA: Diagnosis not present

## 2023-06-28 DIAGNOSIS — F310 Bipolar disorder, current episode hypomanic: Secondary | ICD-10-CM | POA: Diagnosis not present

## 2023-06-28 DIAGNOSIS — F1491 Cocaine use, unspecified, in remission: Secondary | ICD-10-CM | POA: Diagnosis not present

## 2023-07-10 DIAGNOSIS — F310 Bipolar disorder, current episode hypomanic: Secondary | ICD-10-CM | POA: Diagnosis not present

## 2023-07-12 DIAGNOSIS — F310 Bipolar disorder, current episode hypomanic: Secondary | ICD-10-CM | POA: Diagnosis not present

## 2023-07-12 DIAGNOSIS — F1491 Cocaine use, unspecified, in remission: Secondary | ICD-10-CM | POA: Diagnosis not present

## 2023-07-13 DIAGNOSIS — F439 Reaction to severe stress, unspecified: Secondary | ICD-10-CM | POA: Diagnosis not present

## 2023-07-18 DIAGNOSIS — F439 Reaction to severe stress, unspecified: Secondary | ICD-10-CM | POA: Diagnosis not present

## 2023-07-24 DIAGNOSIS — F1491 Cocaine use, unspecified, in remission: Secondary | ICD-10-CM | POA: Diagnosis not present

## 2023-07-24 DIAGNOSIS — F310 Bipolar disorder, current episode hypomanic: Secondary | ICD-10-CM | POA: Diagnosis not present

## 2023-07-25 DIAGNOSIS — F439 Reaction to severe stress, unspecified: Secondary | ICD-10-CM | POA: Diagnosis not present

## 2023-08-01 DIAGNOSIS — F439 Reaction to severe stress, unspecified: Secondary | ICD-10-CM | POA: Diagnosis not present

## 2023-08-08 DIAGNOSIS — F439 Reaction to severe stress, unspecified: Secondary | ICD-10-CM | POA: Diagnosis not present

## 2023-08-15 DIAGNOSIS — F439 Reaction to severe stress, unspecified: Secondary | ICD-10-CM | POA: Diagnosis not present

## 2023-08-21 DIAGNOSIS — F310 Bipolar disorder, current episode hypomanic: Secondary | ICD-10-CM | POA: Diagnosis not present

## 2023-08-21 DIAGNOSIS — F439 Reaction to severe stress, unspecified: Secondary | ICD-10-CM | POA: Diagnosis not present

## 2023-08-21 DIAGNOSIS — F1491 Cocaine use, unspecified, in remission: Secondary | ICD-10-CM | POA: Diagnosis not present

## 2023-08-23 ENCOUNTER — Other Ambulatory Visit: Payer: Self-pay | Admitting: Family Medicine

## 2023-08-23 DIAGNOSIS — I10 Essential (primary) hypertension: Secondary | ICD-10-CM

## 2023-08-23 MED ORDER — LISINOPRIL-HYDROCHLOROTHIAZIDE 10-12.5 MG PO TABS: 1.0000 | ORAL_TABLET | Freq: Every day | ORAL | 1 refills | Status: DC

## 2023-08-23 NOTE — Telephone Encounter (Signed)
 Copied from CRM [id number]. Topic: Clinical - Medication Refill >> Aug 23, 2023 10:37 AM Deaijah H wrote: Medication: lisinopril -hydrochlorothiazide  (ZESTORETIC ) 10-12.5 MG tablet  Has the patient contacted their pharmacy? Yes (Agent: If no, request that the patient contact the pharmacy for the refill. If patient does not wish to contact the pharmacy document the reason why and proceed with request.) (Agent: If yes, when and what did the pharmacy advise?) No refills  This is the patient's preferred pharmacy:  [facility]  [address] [place], [place] [address] Phone: [phone] Is this the correct pharmacy for this prescription? Yes If no, delete pharmacy and type the correct one.   Has the prescription been filled recently? No  Is the patient out of the medication? No  Has the patient been seen for an appointment in the last year OR does the patient have an upcoming appointment? Yes  Can we respond through MyChart? Yes  Agent: Please be advised that Rx refills may take up to 3 business days. We ask that you follow-up with your pharmacy.

## 2023-08-29 DIAGNOSIS — F439 Reaction to severe stress, unspecified: Secondary | ICD-10-CM | POA: Diagnosis not present

## 2023-09-04 DIAGNOSIS — F310 Bipolar disorder, current episode hypomanic: Secondary | ICD-10-CM | POA: Diagnosis not present

## 2023-10-03 DIAGNOSIS — F439 Reaction to severe stress, unspecified: Secondary | ICD-10-CM | POA: Diagnosis not present

## 2023-10-17 ENCOUNTER — Encounter: Payer: Self-pay | Admitting: Family Medicine

## 2023-10-17 ENCOUNTER — Ambulatory Visit: Admitting: Family Medicine

## 2023-10-17 VITALS — BP 128/82 | HR 72 | Temp 97.8°F | Ht 66.0 in | Wt 203.0 lb

## 2023-10-17 DIAGNOSIS — M25642 Stiffness of left hand, not elsewhere classified: Secondary | ICD-10-CM | POA: Diagnosis not present

## 2023-10-17 DIAGNOSIS — F439 Reaction to severe stress, unspecified: Secondary | ICD-10-CM | POA: Diagnosis not present

## 2023-10-17 DIAGNOSIS — E782 Mixed hyperlipidemia: Secondary | ICD-10-CM

## 2023-10-17 DIAGNOSIS — E1140 Type 2 diabetes mellitus with diabetic neuropathy, unspecified: Secondary | ICD-10-CM | POA: Insufficient documentation

## 2023-10-17 DIAGNOSIS — Z23 Encounter for immunization: Secondary | ICD-10-CM | POA: Diagnosis not present

## 2023-10-17 DIAGNOSIS — I10 Essential (primary) hypertension: Secondary | ICD-10-CM | POA: Diagnosis not present

## 2023-10-17 DIAGNOSIS — E559 Vitamin D deficiency, unspecified: Secondary | ICD-10-CM

## 2023-10-17 DIAGNOSIS — I440 Atrioventricular block, first degree: Secondary | ICD-10-CM

## 2023-10-17 DIAGNOSIS — E66811 Obesity, class 1: Secondary | ICD-10-CM | POA: Diagnosis not present

## 2023-10-17 DIAGNOSIS — M65322 Trigger finger, left index finger: Secondary | ICD-10-CM | POA: Insufficient documentation

## 2023-10-17 DIAGNOSIS — R350 Frequency of micturition: Secondary | ICD-10-CM | POA: Diagnosis not present

## 2023-10-17 DIAGNOSIS — R200 Anesthesia of skin: Secondary | ICD-10-CM

## 2023-10-17 DIAGNOSIS — R21 Rash and other nonspecific skin eruption: Secondary | ICD-10-CM

## 2023-10-17 DIAGNOSIS — L219 Seborrheic dermatitis, unspecified: Secondary | ICD-10-CM

## 2023-10-17 DIAGNOSIS — F319 Bipolar disorder, unspecified: Secondary | ICD-10-CM

## 2023-10-17 DIAGNOSIS — R202 Paresthesia of skin: Secondary | ICD-10-CM | POA: Diagnosis not present

## 2023-10-17 LAB — CBC WITH DIFFERENTIAL/PLATELET
Basophils Absolute: 0 K/uL (ref 0.0–0.1)
Basophils Relative: 0.5 % (ref 0.0–3.0)
Eosinophils Absolute: 0.2 K/uL (ref 0.0–0.7)
Eosinophils Relative: 2.7 % (ref 0.0–5.0)
HCT: 39.6 % (ref 36.0–46.0)
Hemoglobin: 13.2 g/dL (ref 12.0–15.0)
Lymphocytes Relative: 45.5 % (ref 12.0–46.0)
Lymphs Abs: 2.7 K/uL (ref 0.7–4.0)
MCHC: 33.4 g/dL (ref 30.0–36.0)
MCV: 89.6 fl (ref 78.0–100.0)
Monocytes Absolute: 0.3 K/uL (ref 0.1–1.0)
Monocytes Relative: 5.6 % (ref 3.0–12.0)
Neutro Abs: 2.7 K/uL (ref 1.4–7.7)
Neutrophils Relative %: 45.7 % (ref 43.0–77.0)
Platelets: 231 K/uL (ref 150.0–400.0)
RBC: 4.43 Mil/uL (ref 3.87–5.11)
RDW: 14.8 % (ref 11.5–15.5)
WBC: 6 K/uL (ref 4.0–10.5)

## 2023-10-17 LAB — URINALYSIS, ROUTINE W REFLEX MICROSCOPIC
Bilirubin Urine: NEGATIVE
Hgb urine dipstick: NEGATIVE
Ketones, ur: NEGATIVE
Nitrite: NEGATIVE
RBC / HPF: NONE SEEN (ref 0–?)
Specific Gravity, Urine: 1.015 (ref 1.000–1.030)
Total Protein, Urine: NEGATIVE
Urine Glucose: NEGATIVE
Urobilinogen, UA: 0.2 (ref 0.0–1.0)
pH: 7 (ref 5.0–8.0)

## 2023-10-17 LAB — LIPID PANEL
Cholesterol: 233 mg/dL — ABNORMAL HIGH (ref 0–200)
HDL: 40 mg/dL (ref >39.00)
LDL Cholesterol: 156 mg/dL — ABNORMAL HIGH (ref 0–99)
NonHDL: 193.4
Total CHOL/HDL Ratio: 6
Triglycerides: 189 mg/dL — ABNORMAL HIGH (ref 0.0–149.0)
VLDL: 37.8 mg/dL (ref 0.0–40.0)

## 2023-10-17 LAB — COMPREHENSIVE METABOLIC PANEL WITH GFR
ALT: 24 U/L (ref 0–35)
AST: 18 U/L (ref 0–37)
Albumin: 4.3 g/dL (ref 3.5–5.2)
Alkaline Phosphatase: 57 U/L (ref 39–117)
BUN: 12 mg/dL (ref 6–23)
CO2: 29 meq/L (ref 19–32)
Calcium: 9.2 mg/dL (ref 8.4–10.5)
Chloride: 102 meq/L (ref 96–112)
Creatinine, Ser: 0.79 mg/dL (ref 0.40–1.20)
GFR: 83.91 mL/min (ref >60.00)
Glucose, Bld: 124 mg/dL — ABNORMAL HIGH (ref 70–99)
Potassium: 4.5 meq/L (ref 3.5–5.1)
Sodium: 139 meq/L (ref 135–145)
Total Bilirubin: 0.6 mg/dL (ref 0.2–1.2)
Total Protein: 8.1 g/dL (ref 6.0–8.3)

## 2023-10-17 LAB — VITAMIN D 25 HYDROXY (VIT D DEFICIENCY, FRACTURES): VITD: 35.54 ng/mL (ref 30.00–100.00)

## 2023-10-17 LAB — TSH: TSH: 1.03 u[IU]/mL (ref 0.35–5.50)

## 2023-10-17 LAB — HEMOGLOBIN A1C: Hgb A1c MFr Bld: 6.9 % — ABNORMAL HIGH (ref 4.6–6.5)

## 2023-10-17 LAB — SEDIMENTATION RATE: Sed Rate: 34 mm/h — ABNORMAL HIGH (ref 0–30)

## 2023-10-17 LAB — MICROALBUMIN / CREATININE URINE RATIO
Creatinine,U: 155.7 mg/dL
Microalb Creat Ratio: 7.5 mg/g (ref 0.0–30.0)
Microalb, Ur: 1.2 mg/dL (ref 0.0–1.9)

## 2023-10-17 LAB — FERRITIN: Ferritin: 55.3 ng/mL (ref 10.0–291.0)

## 2023-10-17 LAB — VITAMIN B12: Vitamin B-12: 257 pg/mL (ref 211–911)

## 2023-10-17 LAB — T4, FREE: Free T4: 0.76 ng/dL (ref 0.60–1.60)

## 2023-10-17 LAB — C-REACTIVE PROTEIN: CRP: <1 mg/dL (ref 0.5–20.0)

## 2023-10-17 MED ORDER — CLOTRIMAZOLE-BETAMETHASONE 1-0.05 % EX CREA: 1.0000 | TOPICAL_CREAM | Freq: Every day | CUTANEOUS | 0 refills | Status: AC

## 2023-10-17 NOTE — Progress Notes (Signed)
 Subjective:     Patient ID: Deborah Haney, female    DOB: May 02, 1965, 58 y.o.   MRN: [id number]  Chief Complaint  Patient presents with   Medical Management of Chronic Issues     pain in hands for last 5 months and loss of appetite     HPI  Discussed the use of AI scribe software for clinical note transcription with the patient, who gave verbal consent to proceed.  History of Present Illness Deborah Haney is a 58 year old female with diabetes, hyperlipidemia, and neuropathy who presents for follow-up on her chronic health issues.  Hand pain and weakness - Aching pain and weakness in both hands, most pronounced in the left index finger - Left index finger locks and is difficult to move - Symptoms have persisted for six months - Tingling sensation and morning stiffness in hands - Symptoms interfere with ability to perform tasks - Massages hands and uses lotion for relief - Avoids medication for hand symptoms  Glycemic control and diabetes symptoms - Does not regularly check blood glucose levels - Not taking any medication for diabetes - Possible uncontrolled blood sugar - Frequent urination without dysuria   Hypertension management - Hypertension managed with lisinopril  hydrochlorothiazide  - Takes antihypertensive medication daily  Hyperlipidemia and supplement use - Hyperlipidemia present - Takes vitamin B12 every other day - Does not take vitamin D  supplement  Gastrointestinal bleeding - Gastrointestinal bleeding with blood in stool approximately once per month -no N/V/D  - Bleeding episodes occur particularly after certain foods - No recent evaluation by gastroenterology  Vasomotor symptoms and hormone replacement therapy - On hormone replacement therapy prescribed by OB/GYN  - Has not worn hormone patch for three days to assess hot flashes - Hot flashes have improved but persist  Dermatologic symptoms - Rashes present on neck, scalp, and eyebrows -  Manages rashes with moisturizer, lotion, and grease on scalp - Washes hair every two weeks - No recent dermatology evaluation - No fever, chills, or vomiting  Family history of arthritis - No family history of rheumatoid arthritis or lupus - Family history of arthritis      Health Maintenance Due  Topic Date Due   Zoster Vaccines- Shingrix (1 of 2) Never done   Diabetic kidney evaluation - Urine ACR  02/02/2018   OPHTHALMOLOGY EXAM  02/23/2022   HEMOGLOBIN A1C  08/18/2023   INFLUENZA VACCINE  10/06/2023    Past Medical History:  Diagnosis Date   Acute cholecystitis 07/14/2013   Acute on chronic respiratory failure with hypoxia (HCC) 09/05/2018   Anemia    has history, takes iron   Anxiety    Bipolar affective disorder (HCC) 10/26/2015   Has seen counselor, declined medications per medical record from [facility]. hypermanic   Chest pain 07/14/2013   Chronic vaginitis 10/26/2015   Has h/o vag dryness and tried Estrace per [facility] records   COVID-19 virus infection 08/24/2018   Depression    Diabetes (HCC)    Patient denies   Diverticulitis    no problems   Dysfunctional uterine bleeding 03/02/2011   Elevated hemoglobin A1c 10/26/2015   GERD (gastroesophageal reflux disease)    tums prn   Headache(784.0)    Hemorrhoids    HTN (hypertension)    BP meds since fall 2016   Internal hemorrhoids    Nausea & vomiting 07/14/2013   Obesity 10/26/2015   Perimenopausal symptom 04/30/2021   Rectal bleeding 03/15/2017    Past Surgical History:  Procedure  Laterality Date   ABDOMINAL HYSTERECTOMY     CHOLECYSTECTOMY N/A 07/15/2013   Procedure: LAPAROSCOPIC CHOLECYSTECTOMY;  Surgeon: Donnice Bury, MD;  Location: [facility] OR;  Service: General;  Laterality: N/A;   COLONOSCOPY     ECTOPIC PREGNANCY SURGERY     laparotomy   HEMORRHOID BANDING     ROBOTIC ASSISTED LAP VAGINAL HYSTERECTOMY      Family History  Problem Relation Age of Onset   Diabetes Mother    Hypertension  Mother    Arthritis Father 37   Hypertension Brother    Cancer Brother        blood    Cancer Brother        unsure kind, as a child    CVA Other    Colon cancer Neg Hx    Colon polyps Neg Hx    Esophageal cancer Neg Hx    Rectal cancer Neg Hx    Stomach cancer Neg Hx    Pancreatic cancer Neg Hx     Social History   Socioeconomic History   Marital status: Divorced    Spouse name: Not on file   Number of children: 0   Years of education: Not on file   Highest education level: Not on file  Occupational History   Occupation: Child psychotherapist, Dealer  Tobacco Use   Smoking status: Former    Current packs/day: 0.00    Types: Cigarettes    Quit date: 04/18/2008    Years since quitting: 15.5   Smokeless tobacco: Never  Vaping Use   Vaping status: Never Used  Substance and Sexual Activity   Alcohol use: Yes    Comment: 1 glass of wine rarely, occasionally   Drug use: Yes    Types: Marijuana    Comment: last use this am partial blunt, Former cocaine use since 2012   Sexual activity: Yes    Partners: Male    Birth control/protection: Surgical  Other Topics Concern   Not on file  Social History Narrative   Lives alone.     No children, no pets.   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   Social Drivers of Corporate investment banker Strain: Not on file  Food Insecurity: Not on file  Transportation Needs: Not on file  Physical Activity: Unknown (08/31/2018)   Exercise Vital Sign    Days of Exercise per Week: Patient declined    Minutes of Exercise per Session: Patient declined  Stress: Not on file  Social Connections: Unknown (08/31/2018)   Social Connection and Isolation Panel    Frequency of Communication with Friends and Family: Patient declined    Frequency of Social Gatherings with Friends and Family: Patient declined    Attends Religious Services: Patient declined    Database administrator or Organizations: Patient declined     Attends Banker Meetings: Patient declined    Marital Status: Patient declined  Intimate Partner Violence: Unknown (08/31/2018)   Humiliation, Afraid, Rape, and Kick questionnaire    Fear of Current or Ex-Partner: Patient declined    Emotionally Abused: Patient declined    Physically Abused: Patient declined    Sexually Abused: Patient declined    Outpatient Medications Prior to Visit  Medication Sig Dispense Refill   Accu-Chek Softclix Lancets lancets Use as instructed;  Check FSBS BID - Include strips # 50 with 5 refills, Lancets #50 with 5 refills DX: Type 2 DM - ICD 10: E11.9 100 each 12  ARIPiprazole (ABILIFY) 5 MG tablet Take 5 mg by mouth daily.     Blood Glucose Monitoring Suppl (CONTOUR MONITOR) w/Device KIT 1 kit by Does not apply route 2 (two) times a day. 1 kit 0   cholestyramine  (QUESTRAN ) 4 g packet MIX AND DRINK 1 PACKET DAILY 90 packet 0   estradiol (VIVELLE-DOT) 0.025 MG/24HR APPLY 1 PATCH TOPICALLY TWICE A WEEK     fluticasone  (FLONASE ) 50 MCG/ACT nasal spray Place 2 sprays into both nostrils daily. 16 g 6   glucose blood (CONTOUR NEXT TEST) test strip Use as instructed 100 each 0   lisinopril -hydrochlorothiazide  (ZESTORETIC ) 10-12.5 MG tablet Take 1 tablet by mouth daily. 90 tablet 1   mupirocin  cream (BACTROBAN ) 2 % Apply 1 application. topically 2 (two) times daily. 15 g 1   naproxen  (NAPROSYN ) 375 MG tablet TAKE 1 TABLET BY MOUTH TWICE DAILY FOR 14 DAYS     Probiotic Product (PROBIOTIC-10 PO) Take 1 mg by mouth 1 day or 1 dose. Pt. Takes every other day     Vitamin D , Ergocalciferol , (DRISDOL ) 1.25 MG (50000 UNIT) CAPS capsule Take 1 capsule (50,000 Units total) by mouth every 7 (seven) days. 6 capsule 0   gabapentin  (NEURONTIN ) 100 MG capsule Take 1 capsule (100 mg total) by mouth 3 (three) times daily. (Patient not taking: Reported on 10/17/2023) 90 capsule 3   No facility-administered medications prior to visit.    Allergies  Allergen Reactions    Augmentin  [Amoxicillin -Pot Clavulanate] Rash    ROS See HPI    Objective:    Physical Exam Constitutional:      General: She is not in acute distress.    Appearance: She is not ill-appearing.  HENT:     Mouth/Throat:     Mouth: Mucous membranes are moist.     Pharynx: Oropharynx is clear.  Eyes:     Extraocular Movements: Extraocular movements intact.     Conjunctiva/sclera: Conjunctivae normal.     Pupils: Pupils are equal, round, and reactive to light.  Cardiovascular:     Rate and Rhythm: Normal rate and regular rhythm.  Pulmonary:     Effort: Pulmonary effort is normal.     Breath sounds: Normal breath sounds.  Musculoskeletal:     Cervical back: Normal range of motion and neck supple.     Right lower leg: No edema.     Left lower leg: No edema.     Comments: Left index finger with mild swelling and limited ROM. Hand exam normal otherwise.    Skin:    General: Skin is warm and dry.     Comments: Dry patches along hair line  Anterior chest with round areas of hypopigmentation   Neurological:     General: No focal deficit present.     Mental Status: She is alert and oriented to person, place, and time.     Motor: No weakness.     Coordination: Coordination normal.     Gait: Gait normal.  Psychiatric:        Mood and Affect: Mood normal.        Behavior: Behavior normal.        Thought Content: Thought content normal.      BP 128/82   Pulse 72   Temp 97.8 F (36.6 C) (Temporal)   Ht 5' 6 (1.676 m)   Wt 203 lb (92.1 kg)   LMP 04/25/2011   SpO2 98%   BMI 32.77 kg/m  Wt Readings from Last 3 Encounters:  10/17/23 203 lb (92.1 kg)  02/17/23 188 lb (85.3 kg)  12/15/22 185 lb 6.4 oz (84.1 kg)       Assessment & Plan:   Problem List Items Addressed This Visit     1st degree AV block   Bipolar affective disorder (HCC)   Controlled type 2 diabetes mellitus with diabetic neuropathy, without long-term current use of insulin  (HCC) - Primary   Relevant  Orders   CBC with Differential/Platelet   Comprehensive metabolic panel with GFR   Hemoglobin A1c   Microalbumin / creatinine urine ratio   Essential hypertension   Relevant Orders   CBC with Differential/Platelet   Comprehensive metabolic panel with GFR   Mixed dyslipidemia   Relevant Orders   Lipid panel   Morning joint stiffness of hand, left   Relevant Orders   Rheumatoid factor   Sedimentation rate   C-reactive protein   ANA   Numbness and tingling in both hands   Relevant Orders   CBC with Differential/Platelet   Comprehensive metabolic panel with GFR   Ferritin   Magnesium   Vitamin B12   TSH   T4, free   Sedimentation rate   C-reactive protein   ANA   Obesity (BMI 30.0-34.9)   Relevant Orders   CBC with Differential/Platelet   Comprehensive metabolic panel with GFR   Hemoglobin A1c   Lipid panel   TSH   T4, free   Trigger finger, left index finger   Urinary frequency   Relevant Orders   Urinalysis, Routine w reflex microscopic   Vitamin D  deficiency   Relevant Orders   VITAMIN D  25 Hydroxy (Vit-D Deficiency, Fractures)   Other Visit Diagnoses       Rash and other nonspecific skin eruption       Relevant Orders   Sedimentation rate   C-reactive protein   ANA   Ambulatory referral to Dermatology     Immunization due       Relevant Orders   Pneumococcal conjugate vaccine 20-valent (Prevnar 20) (Completed)     Seborrheic dermatitis of scalp       Relevant Orders   Ambulatory referral to Dermatology      Assessment and Plan Assessment & Plan Type 2 diabetes mellitus with diabetic neuropathy Diabetes management is suboptimal due to lack of home blood glucose monitoring. Neuropathy symptoms persist, and she is not taking gabapentin . Frequent urination suggests poor glycemic control. - Check hemoglobin A1c, kidney function, and thyroid  function - Encourage home blood glucose monitoring -not on medication and prefers to avoid pharmacological  therapy -check UA and urine microalbumin -unclear as to why she is not currently on statin therapy and will need follow up - she will call to schedule eye exam -Prevnar 20 given today   Hand pain and weakness with numbness and tingling Chronic hand pain and weakness with numbness and tingling, particularly in the left index finger, persisting for about six months. Symptoms suggest possible carpal tunnel syndrome or ulnar nerve involvement. Reports stiffness and swelling in the hands, especially in the morning. - Order blood tests to evaluate B12 level, iron, thyroid , inflammatory markers - Consider referral to a hand specialist after reviewing lab results  Seborrheic dermatitis and rash Persistent rash on the neck, scalp, and eyebrows, likely seborrheic dermatitis. Previous treatments have been ineffective. - Prescribe antifungal medication for chest - Refer to dermatology for further evaluation  Essential hypertension Blood pressure is well-controlled with current medication regimen. She is adherent to lisinopril -hydrochlorothiazide .  Mixed  hyperlipidemia -check fasting lipids. Recommend low fat diet. Unclear as to why she is not currently on statin therapy. Follow up pending lipids.   Vitamin D  deficiency Vitamin D  deficiency with no current supplementation. - Check vitamin D  levels  Fatigue -ongoing. Check labs to look for underlying etiology   GI bleeding Intermittent GI bleeding, occurring approximately once a month, associated with certain foods and increased bowel movements. No follow-up with gastroenterology since the last visit. Advised her to send a mychart message to update her GI    I have discontinued Deborah SAILOR. Spake's gabapentin . I am also having her start on clotrimazole -betamethasone . Additionally, I am having her maintain her Contour Monitor, Probiotic Product (PROBIOTIC-10 PO), Accu-Chek Softclix Lancets, mupirocin  cream, cholestyramine , fluticasone , estradiol,  naproxen , Contour Next Test, ARIPiprazole, Vitamin D  (Ergocalciferol ), and lisinopril -hydrochlorothiazide .  Meds ordered this encounter  Medications   clotrimazole -betamethasone  (LOTRISONE ) cream    Sig: Apply 1 Application topically daily.    Dispense:  30 g    Refill:  0    Supervising Provider:   ROLLENE NORRIS A [[id number]]

## 2023-10-17 NOTE — Patient Instructions (Addendum)
 Please go downstairs for labs and a urine before you leave  Use the prescribed antifungal medication on your chest.  I referred you to dermatology and they should call you to schedule  Once I have your lab results, I will be in touch.  I may need to refer you to the hand specialist if you are not improving and there is no explanation with your labs.  Increase water  intake and limit sugar and carbohydrates  Call and schedule your diabetic eye exam!!!!  Follow-up with me in 4 to 6 weeks

## 2023-10-19 LAB — MAGNESIUM: Magnesium: 2.3 mg/dL (ref 1.5–2.5)

## 2023-10-21 LAB — ANTI-NUCLEAR AB-TITER (ANA TITER)
ANA TITER: 1:40 {titer} — ABNORMAL HIGH
ANA Titer 1: 1:40 {titer} — ABNORMAL HIGH

## 2023-10-21 LAB — RHEUMATOID FACTOR: Rheumatoid fact SerPl-aCnc: <10 [IU]/mL (ref <14)

## 2023-10-21 LAB — ANA: Anti Nuclear Antibody (ANA): POSITIVE — AB

## 2023-10-22 ENCOUNTER — Ambulatory Visit: Payer: Self-pay | Admitting: Family Medicine

## 2023-10-23 NOTE — Progress Notes (Signed)
 Please ask her to come in for ov to follow up on abnormal labs in 2-3 weeks.

## 2023-10-24 DIAGNOSIS — F439 Reaction to severe stress, unspecified: Secondary | ICD-10-CM | POA: Diagnosis not present

## 2023-11-08 ENCOUNTER — Encounter: Payer: Self-pay | Admitting: Family Medicine

## 2023-11-08 ENCOUNTER — Ambulatory Visit: Payer: Self-pay | Admitting: Family Medicine

## 2023-11-08 ENCOUNTER — Ambulatory Visit (INDEPENDENT_AMBULATORY_CARE_PROVIDER_SITE_OTHER)

## 2023-11-08 ENCOUNTER — Ambulatory Visit: Admitting: Family Medicine

## 2023-11-08 VITALS — BP 130/86 | HR 69 | Temp 97.7°F | Ht 66.0 in | Wt 211.0 lb

## 2023-11-08 DIAGNOSIS — M25642 Stiffness of left hand, not elsewhere classified: Secondary | ICD-10-CM

## 2023-11-08 DIAGNOSIS — E1140 Type 2 diabetes mellitus with diabetic neuropathy, unspecified: Secondary | ICD-10-CM | POA: Diagnosis not present

## 2023-11-08 DIAGNOSIS — I10 Essential (primary) hypertension: Secondary | ICD-10-CM

## 2023-11-08 DIAGNOSIS — R768 Other specified abnormal immunological findings in serum: Secondary | ICD-10-CM

## 2023-11-08 DIAGNOSIS — E1169 Type 2 diabetes mellitus with other specified complication: Secondary | ICD-10-CM | POA: Diagnosis not present

## 2023-11-08 DIAGNOSIS — R200 Anesthesia of skin: Secondary | ICD-10-CM

## 2023-11-08 DIAGNOSIS — M5441 Lumbago with sciatica, right side: Secondary | ICD-10-CM | POA: Diagnosis not present

## 2023-11-08 DIAGNOSIS — Z7985 Long-term (current) use of injectable non-insulin antidiabetic drugs: Secondary | ICD-10-CM

## 2023-11-08 DIAGNOSIS — M47816 Spondylosis without myelopathy or radiculopathy, lumbar region: Secondary | ICD-10-CM | POA: Diagnosis not present

## 2023-11-08 DIAGNOSIS — E785 Hyperlipidemia, unspecified: Secondary | ICD-10-CM

## 2023-11-08 DIAGNOSIS — F439 Reaction to severe stress, unspecified: Secondary | ICD-10-CM | POA: Diagnosis not present

## 2023-11-08 DIAGNOSIS — Z7984 Long term (current) use of oral hypoglycemic drugs: Secondary | ICD-10-CM

## 2023-11-08 DIAGNOSIS — M4316 Spondylolisthesis, lumbar region: Secondary | ICD-10-CM | POA: Diagnosis not present

## 2023-11-08 DIAGNOSIS — R350 Frequency of micturition: Secondary | ICD-10-CM

## 2023-11-08 DIAGNOSIS — R202 Paresthesia of skin: Secondary | ICD-10-CM

## 2023-11-08 LAB — URINALYSIS, ROUTINE W REFLEX MICROSCOPIC
Bilirubin Urine: NEGATIVE
Hgb urine dipstick: NEGATIVE
Ketones, ur: NEGATIVE
Nitrite: NEGATIVE
Specific Gravity, Urine: 1.01 (ref 1.000–1.030)
Total Protein, Urine: NEGATIVE
Urine Glucose: NEGATIVE
Urobilinogen, UA: 0.2 (ref 0.0–1.0)
pH: 7 (ref 5.0–8.0)

## 2023-11-08 MED ORDER — ROSUVASTATIN CALCIUM 5 MG PO TABS: 5.0000 mg | ORAL_TABLET | Freq: Every day | ORAL | 1 refills | Status: DC

## 2023-11-08 MED ORDER — TIRZEPATIDE 2.5 MG/0.5ML ~~LOC~~ SOAJ: 2.5000 mg | SUBCUTANEOUS | 1 refills | Status: DC

## 2023-11-08 NOTE — Patient Instructions (Addendum)
 Please go downstairs for an X ray of your low back    I referred you to [facility] sports medicine on the first floor of our building for your worsening back pain and any other orthopedic concerns  I also referred you to rheumatology and they will call you to schedule.   Start the cholesterol medication and the once weekly injections.  Stay hydrated and eat small low-fat foods.  Do not get constipated.  Use MiraLAX if you need to. Follow-up with your gastroenterologist for any GI bleeding

## 2023-11-08 NOTE — Progress Notes (Signed)
 Subjective:     Patient ID: Deborah Haney, female    DOB: [birth year], 58 y.o.   MRN: [id number]  Chief Complaint  Patient presents with   Follow-up    HPI  Discussed the use of AI scribe software for clinical note transcription with the patient, who gave verbal consent to proceed.  History of Present Illness Deborah Haney is a 58 year old female with diabetes, hyperlipidemia, and leukocytes in urine who presents for follow-up.  Urinary symptoms and leukocyturia - Urinary frequency present - Previous urinalysis showed leukocytes - Urine culture planned to assess for infection  Hyperglycemia - Elevated blood sugars - Not currently on diabetes medication and has not been checking blood sugars  - Previously used Ozempic  without issues - No history of pancreatitis, thyroid  cancer, or plans for pregnancy  Musculoskeletal symptoms - Hand pain, stiffness, and swelling - Positive ANA test - Left index finger aches - Tingling and numbness in right leg -She requests to see rheumatologist   Right lower extremity pain and radiculopathy - Right leg pain worsening with prolonged standing or sitting - Pain radiates from lower back to buttock and down the leg - Pain present for about a year and a half, worsening over time - Tylenol  provided some relief - No imaging of lower back performed  Hyperlipidemia and statin intolerance - Not currently on cholesterol medication - Previously took atorvastatin , discontinued due to muscle pain     Health Maintenance Due  Topic Date Due   Zoster Vaccines- Shingrix (1 of 2) Never done   OPHTHALMOLOGY EXAM  02/23/2022   INFLUENZA VACCINE  10/06/2023   COVID-19 Vaccine (4 - 2025-26 season) 11/06/2023    Past Medical History:  Diagnosis Date   Acute cholecystitis 07/14/2013   Acute on chronic respiratory failure with hypoxia (HCC) 09/05/2018   Anemia    has history, takes iron   Anxiety    Bipolar affective disorder (HCC) 10/26/2015    Has seen counselor, declined medications per medical record from [facility]. hypermanic   Chest pain 07/14/2013   Chronic vaginitis 10/26/2015   Has h/o vag dryness and tried Estrace per [facility] records   COVID-19 virus infection 08/24/2018   Depression    Diabetes (HCC)    Patient denies   Diverticulitis    no problems   Dysfunctional uterine bleeding 03/02/2011   Elevated hemoglobin A1c 10/26/2015   GERD (gastroesophageal reflux disease)    tums prn   Headache(784.0)    Hemorrhoids    HTN (hypertension)    BP meds since fall 2016   Internal hemorrhoids    Nausea & vomiting 07/14/2013   Obesity 10/26/2015   Perimenopausal symptom 04/30/2021   Rectal bleeding 03/15/2017    Past Surgical History:  Procedure Laterality Date   ABDOMINAL HYSTERECTOMY     CHOLECYSTECTOMY N/A 07/15/2013   Procedure: LAPAROSCOPIC CHOLECYSTECTOMY;  Surgeon: Donnice Bury, MD;  Location: [facility] OR;  Service: General;  Laterality: N/A;   COLONOSCOPY     ECTOPIC PREGNANCY SURGERY     laparotomy   HEMORRHOID BANDING     ROBOTIC ASSISTED LAP VAGINAL HYSTERECTOMY      Family History  Problem Relation Age of Onset   Diabetes Mother    Hypertension Mother    Arthritis Father 28   Hypertension Brother    Cancer Brother        blood    Cancer Brother        unsure kind, as a child  CVA Other    Colon cancer Neg Hx    Colon polyps Neg Hx    Esophageal cancer Neg Hx    Rectal cancer Neg Hx    Stomach cancer Neg Hx    Pancreatic cancer Neg Hx     Social History   Socioeconomic History   Marital status: Divorced    Spouse name: Not on file   Number of children: 0   Years of education: Not on file   Highest education level: Not on file  Occupational History   Occupation: Child psychotherapist, Dealer  Tobacco Use   Smoking status: Former    Current packs/day: 0.00    Types: Cigarettes    Quit date: 04/18/2008    Years since quitting: 15.5   Smokeless tobacco: Never  Vaping Use    Vaping status: Never Used  Substance and Sexual Activity   Alcohol use: Yes    Comment: 1 glass of wine rarely, occasionally   Drug use: Yes    Types: Marijuana    Comment: last use this am partial blunt, Former cocaine use since 2012   Sexual activity: Yes    Partners: Male    Birth control/protection: Surgical  Other Topics Concern   Not on file  Social History Narrative   Lives alone.     No children, no pets.   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   Social Drivers of Corporate investment banker Strain: Not on file  Food Insecurity: Not on file  Transportation Needs: Not on file  Physical Activity: Unknown (08/31/2018)   Exercise Vital Sign    Days of Exercise per Week: Patient declined    Minutes of Exercise per Session: Patient declined  Stress: Not on file  Social Connections: Unknown (08/31/2018)   Social Connection and Isolation Panel    Frequency of Communication with Friends and Family: Patient declined    Frequency of Social Gatherings with Friends and Family: Patient declined    Attends Religious Services: Patient declined    Database administrator or Organizations: Patient declined    Attends Banker Meetings: Patient declined    Marital Status: Patient declined  Intimate Partner Violence: Unknown (08/31/2018)   Humiliation, Afraid, Rape, and Kick questionnaire    Fear of Current or Ex-Partner: Patient declined    Emotionally Abused: Patient declined    Physically Abused: Patient declined    Sexually Abused: Patient declined    Outpatient Medications Prior to Visit  Medication Sig Dispense Refill   Accu-Chek Softclix Lancets lancets Use as instructed;  Check FSBS BID - Include strips # 50 with 5 refills, Lancets #50 with 5 refills DX: Type 2 DM - ICD 10: E11.9 100 each 12   ARIPiprazole (ABILIFY) 5 MG tablet Take 5 mg by mouth daily.     Blood Glucose Monitoring Suppl (CONTOUR MONITOR) w/Device KIT 1 kit by Does not  apply route 2 (two) times a day. 1 kit 0   cholestyramine  (QUESTRAN ) 4 g packet MIX AND DRINK 1 PACKET DAILY 90 packet 0   clotrimazole -betamethasone  (LOTRISONE ) cream Apply 1 Application topically daily. 30 g 0   estradiol (VIVELLE-DOT) 0.025 MG/24HR APPLY 1 PATCH TOPICALLY TWICE A WEEK     fluticasone  (FLONASE ) 50 MCG/ACT nasal spray Place 2 sprays into both nostrils daily. 16 g 6   glucose blood (CONTOUR NEXT TEST) test strip Use as instructed 100 each 0   lisinopril -hydrochlorothiazide  (ZESTORETIC ) 10-12.5 MG tablet Take 1  tablet by mouth daily. 90 tablet 1   mupirocin  cream (BACTROBAN ) 2 % Apply 1 application. topically 2 (two) times daily. 15 g 1   naproxen  (NAPROSYN ) 375 MG tablet TAKE 1 TABLET BY MOUTH TWICE DAILY FOR 14 DAYS     Probiotic Product (PROBIOTIC-10 PO) Take 1 mg by mouth 1 day or 1 dose. Pt. Takes every other day     Vitamin D , Ergocalciferol , (DRISDOL ) 1.25 MG (50000 UNIT) CAPS capsule Take 1 capsule (50,000 Units total) by mouth every 7 (seven) days. 6 capsule 0   No facility-administered medications prior to visit.    Allergies  Allergen Reactions   Augmentin  [Amoxicillin -Pot Clavulanate] Rash    ROS     Objective:    Physical Exam Constitutional:      General: She is not in acute distress.    Appearance: She is not ill-appearing.  Eyes:     Extraocular Movements: Extraocular movements intact.     Conjunctiva/sclera: Conjunctivae normal.  Cardiovascular:     Rate and Rhythm: Normal rate.  Pulmonary:     Effort: Pulmonary effort is normal.  Musculoskeletal:     Cervical back: Normal range of motion and neck supple.     Right lower leg: No edema.     Left lower leg: No edema.  Skin:    General: Skin is warm and dry.  Neurological:     General: No focal deficit present.     Mental Status: She is alert and oriented to person, place, and time.     Cranial Nerves: No cranial nerve deficit.     Motor: No weakness.     Coordination: Coordination normal.      Gait: Gait normal.  Psychiatric:        Mood and Affect: Mood normal.        Behavior: Behavior normal.        Thought Content: Thought content normal.      BP 130/86 (BP Location: Left Arm, Patient Position: Sitting)   Pulse 69   Temp 97.7 F (36.5 C) (Temporal)   Ht 5' 6 (1.676 m)   Wt 211 lb (95.7 kg)   LMP 04/25/2011   SpO2 100%   BMI 34.06 kg/m  Wt Readings from Last 3 Encounters:  11/08/23 211 lb (95.7 kg)  10/17/23 203 lb (92.1 kg)  02/17/23 188 lb (85.3 kg)       Assessment & Plan:   Problem List Items Addressed This Visit     ANA positive   Relevant Orders   DG Lumbar Spine 2-3 Views (Completed)   Ambulatory referral to Rheumatology   Controlled type 2 diabetes mellitus with diabetic neuropathy, without long-term current use of insulin  (HCC) - Primary   Relevant Medications   tirzepatide  (MOUNJARO ) 2.5 MG/0.5ML Pen   rosuvastatin  (CRESTOR ) 5 MG tablet   Essential hypertension   Relevant Medications   rosuvastatin  (CRESTOR ) 5 MG tablet   Morning joint stiffness of hand, left   Relevant Orders   Ambulatory referral to Rheumatology   Numbness and tingling in both hands   Relevant Orders   Ambulatory referral to Rheumatology   Urinary frequency   Relevant Orders   Urinalysis, Routine w reflex microscopic (Completed)   Urine Culture   Other Visit Diagnoses       Hyperlipidemia associated with type 2 diabetes mellitus (HCC)       Relevant Medications   tirzepatide  (MOUNJARO ) 2.5 MG/0.5ML Pen   rosuvastatin  (CRESTOR ) 5 MG tablet  Acute right-sided low back pain with right-sided sciatica       Relevant Orders   DG Lumbar Spine 2-3 Views (Completed)   Ambulatory referral to Sports Medicine       Assessment and Plan Assessment & Plan Type 2 diabetes mellitus Blood sugars are elevated. Not currently on medication. Previously on Ozempic  without issues. Discussed starting Mounjaro , a once-weekly injection, to lower blood sugar and aid in  weight loss. No history of pancreatitis, thyroid  cancer, or pregnancy, which are contraindications for Mounjaro . Emphasized the importance of staying hydrated and eating small amounts to avoid dizziness, weakness, and fatigue. Discussed potential side effects including nausea and constipation, and advised starting a constipation treatment to ensure success. Mounjaro  is expected to help with weight loss and reduce cravings for food and other substances. - Prescribe Mounjaro   - Advise on hydration and small frequent meals. - Discuss potential side effects and recommend starting a constipation treatment.  Hyperlipidemia Cholesterol levels need to be lowered. Goal LDL is 70 due to diabetes. Previously on atorvastatin , which caused muscle pain. Plan to start lovastatin (Crestor ) as it may be better tolerated. Encouraged dietary changes to reduce fat intake and increase exercise. - Prescribed Crestor  - Advise on dietary changes to reduce fat intake and increase exercise.  Urinary frequency Experiencing urinary frequency. Previous urinalysis showed leukocytes. Plan to recheck urine and culture to assess for infection. - Recheck urine and order urine culture.  Right lumbosacral radicular pain (sciatica) Pain in the right leg, worsening over the past year and a half. Pain radiates from the lower back down the leg, aggravated by prolonged standing and sitting. No history of imaging. Discussed potential sciatica. Noted tingling and numbness, indicating possible nerve involvement. Advised on using heating pads and topical treatments. Discussed referral to sports medicine for further evaluation. Pain exacerbated by weight gain. - Order x-ray of the lower back. - Refer to sports medicine for further evaluation. - Advise use of heating pad or ice pack and topical treatments.  Abnormal immunological findings (positive ANA) Positive ANA with low titer. Symptoms include hand pain, stiffness, and swelling.  Discussed referral to rheumatology for further evaluation due to persistent symptoms. - Refer to rheumatology for further evaluation.  Follow-Up Plan to follow up in 6-8 weeks to assess response to new medications and interventions. - Schedule follow-up appointment in 6-8 weeks.     I am having Karsten N. Krienke start on tirzepatide  and rosuvastatin . I am also having her maintain her Contour Monitor, Probiotic Product (PROBIOTIC-10 PO), Accu-Chek Softclix Lancets, mupirocin  cream, cholestyramine , fluticasone , estradiol, naproxen , Contour Next Test, ARIPiprazole, Vitamin D  (Ergocalciferol ), lisinopril -hydrochlorothiazide , and clotrimazole -betamethasone .  Meds ordered this encounter  Medications   tirzepatide  (MOUNJARO ) 2.5 MG/0.5ML Pen    Sig: Inject 2.5 mg into the skin once a week.    Dispense:  2 mL    Refill:  1    Supervising Provider:   ROLLENE NORRIS A [[id number]]   rosuvastatin  (CRESTOR ) 5 MG tablet    Sig: Take 1 tablet (5 mg total) by mouth daily.    Dispense:  90 tablet    Refill:  1    Supervising Provider:   ROLLENE NORRIS A [[id number]]

## 2023-11-09 LAB — URINE CULTURE

## 2023-11-14 DIAGNOSIS — F439 Reaction to severe stress, unspecified: Secondary | ICD-10-CM | POA: Diagnosis not present

## 2023-11-15 ENCOUNTER — Encounter: Payer: Self-pay | Admitting: Family Medicine

## 2023-11-15 ENCOUNTER — Ambulatory Visit (INDEPENDENT_AMBULATORY_CARE_PROVIDER_SITE_OTHER): Admitting: Family Medicine

## 2023-11-15 ENCOUNTER — Other Ambulatory Visit: Payer: Self-pay

## 2023-11-15 VITALS — BP 122/82 | HR 85 | Ht 66.0 in | Wt 208.0 lb

## 2023-11-15 DIAGNOSIS — M65322 Trigger finger, left index finger: Secondary | ICD-10-CM

## 2023-11-15 DIAGNOSIS — M79641 Pain in right hand: Secondary | ICD-10-CM

## 2023-11-15 DIAGNOSIS — M545 Low back pain, unspecified: Secondary | ICD-10-CM | POA: Diagnosis not present

## 2023-11-15 DIAGNOSIS — M79642 Pain in left hand: Secondary | ICD-10-CM | POA: Diagnosis not present

## 2023-11-15 DIAGNOSIS — G8929 Other chronic pain: Secondary | ICD-10-CM

## 2023-11-15 NOTE — Patient Instructions (Addendum)
 Thank you for coming in today.   You received an injection today. Seek immediate medical attention if the joint becomes red, extremely painful, or is oozing fluid.   Double Band-Aid Splint for Trigger Finger  See you back as needed.

## 2023-11-15 NOTE — Progress Notes (Unsigned)
 I, Leotis Batter, CMA acting as a scribe for Artist Lloyd, MD.  Deborah Haney is a 58 y.o. female who presents to [facility] today for LBP x 3 years, waxes and wanes, worsening over the past 3 months. Pt locates pain to right side lower back radiating into the R LE. Feels TTP at times, painful to lay on. Short-term relief with Tylenol .   Of note, pt is scheduled to see rheumatology on 10/30.  Radiating pain: R LE LE numbness/tingling: intermittently LE weakness: at times Aggravates: sitting, standing, laying in bed Treatments tried: Tylenol   Pt also c/o bilat hand pain x 1 month. Pt locates pain to both hands, with triggering in the left 2nd digit.   Radiates: no Paresthesia: worse at nighttime Grip strength: decreased, trouble opening things.  Has tried massage  Dx testing: 11/08/23 L-spine XR   10/17/23 Labs  Pertinent review of systems: No fevers or chills  Relevant historical information: Diabetes   Exam:  BP 122/82   Pulse 85   Ht 5' 6 (1.676 m)   Wt 208 lb (94.3 kg)   LMP 04/25/2011   SpO2 99%   BMI 33.57 kg/m  General: Well Developed, well nourished, and in no acute distress.   MSK: Left hand some swelling across MCPs.  Triggering present with flexion of the left second digit PIP joint.  L-spine normal-appearing nontender palpation spinal midline normal lumbar motion.  Lower extremity strength is intact.    Lab and Radiology Results  .Procedure: Real-time Ultrasound Guided Injection of left first MCP tendon sheath at A1 pulley.  Trigger finger injection Device: Philips Affiniti 50G/GE Logiq Images permanently stored and available for review in PACS Verbal informed consent obtained.  Discussed risks and benefits of procedure. Warned about infection, bleeding, hyperglycemia damage to structures among others. Patient expresses understanding and agreement Time-out conducted.   Noted no overlying erythema, induration, or  other signs of local infection.   Skin prepped in a sterile fashion.   Local anesthesia: Topical Ethyl chloride.   With sterile technique and under real time ultrasound guidance: 40 mg of Kenalog and 1 mL of lidocaine  injected into A1 pulley tendon sheath. Fluid seen entering the tendon sheath.   Completed without difficulty   Pain immediately resolved suggesting accurate placement of the medication.   Advised to call if fevers/chills, erythema, induration, drainage, or persistent bleeding.   Images permanently stored and available for review in the ultrasound unit.  Impression: Technically successful ultrasound guided injection. \   EXAM: LUMBAR SPINE - 3 VIEW   COMPARISON:  None Available.   FINDINGS: There is no evidence of lumbar spine fracture. Multilevel degenerative changes of the spine. Grade 1 anterolisthesis of L4-L5. Decreased T12-L1 and L5-S1 disc space.   IMPRESSION: Degenerative changes of the spine and suggestion of disc disease. Recommend dedicated MRI lumbar spine for further assessment if clinically warranted.   Grade 1 anterolisthesis of L4-L5.     Electronically Signed   By: Megan  Zare M.D.   On: 11/08/2023 11:49 I, Artist Lloyd, personally (independently) visualized and performed the interpretation of the images attached in this note.'      Assessment and Plan: 58 y.o. female with chronic low back pain with pain referring to the posterior thighs but not down past the knees.  Symptoms consistent with muscle spasm dysfunction and degenerative changes.  Plan for physical therapy.  Recommend ibuprofen  and Tylenol  as needed.  Left second digit trigger finger.  Plan  for injection today.  Reviewed double Band-Aid splint as well.   PDMP not reviewed this encounter. Orders Placed This Encounter  Procedures   US  LIMITED JOINT SPACE STRUCTURES UP LEFT(NO LINKED CHARGES)    Reason for Exam (SYMPTOM  OR DIAGNOSIS REQUIRED):   left finger pain    Preferred  imaging location?:   [facility]   No orders of the defined types were placed in this encounter.    Discussed warning signs or symptoms. Please see discharge instructions. Patient expresses understanding.   The above documentation has been reviewed and is accurate and complete Artist Lloyd, M.D.

## 2023-11-17 ENCOUNTER — Telehealth: Payer: Self-pay

## 2023-11-17 ENCOUNTER — Other Ambulatory Visit (HOSPITAL_COMMUNITY): Payer: Self-pay

## 2023-11-17 NOTE — Telephone Encounter (Signed)
 Mounjaro PA needed

## 2023-11-17 NOTE — Telephone Encounter (Signed)
 Pharmacy Patient Advocate Encounter   Received notification from Patient Advice Request messages that prior authorization for Mounjaro  2.5mg /0.50ml is required/requested.   Insurance verification completed.   The patient is insured through [employer] .   Per test claim: PA required; PA submitted to above mentioned insurance via Latent Key/confirmation #/EOC [id number] Status is pending

## 2023-11-20 NOTE — Telephone Encounter (Signed)
 Pharmacy Patient Advocate Encounter  Received notification from [facility] that Prior Authorization for Mounjaro  2.5mg /0.61ml has been APPROVED from 11/17/23 to 11/16/24   PA #/Case ID/Reference #: [id number]

## 2023-11-21 ENCOUNTER — Other Ambulatory Visit (HOSPITAL_COMMUNITY): Payer: Self-pay

## 2023-11-23 ENCOUNTER — Other Ambulatory Visit: Payer: Self-pay | Admitting: Family Medicine

## 2023-11-23 MED ORDER — ONDANSETRON 4 MG PO TBDP: 4.0000 mg | ORAL_TABLET | Freq: Three times a day (TID) | ORAL | 0 refills | Status: AC | PRN

## 2023-11-23 NOTE — Telephone Encounter (Signed)
 Notified pt via [employer]

## 2023-12-05 DIAGNOSIS — F439 Reaction to severe stress, unspecified: Secondary | ICD-10-CM | POA: Diagnosis not present

## 2023-12-11 DIAGNOSIS — F439 Reaction to severe stress, unspecified: Secondary | ICD-10-CM | POA: Diagnosis not present

## 2023-12-19 DIAGNOSIS — F439 Reaction to severe stress, unspecified: Secondary | ICD-10-CM | POA: Diagnosis not present

## 2023-12-20 ENCOUNTER — Ambulatory Visit: Admitting: Family Medicine

## 2023-12-20 ENCOUNTER — Ambulatory Visit: Payer: Self-pay | Admitting: Family Medicine

## 2023-12-20 ENCOUNTER — Encounter: Payer: Self-pay | Admitting: Family Medicine

## 2023-12-20 VITALS — BP 132/82 | HR 81 | Temp 97.6°F | Ht 66.0 in | Wt 200.0 lb

## 2023-12-20 DIAGNOSIS — E785 Hyperlipidemia, unspecified: Secondary | ICD-10-CM

## 2023-12-20 DIAGNOSIS — I10 Essential (primary) hypertension: Secondary | ICD-10-CM

## 2023-12-20 DIAGNOSIS — E66811 Obesity, class 1: Secondary | ICD-10-CM | POA: Diagnosis not present

## 2023-12-20 DIAGNOSIS — E1169 Type 2 diabetes mellitus with other specified complication: Secondary | ICD-10-CM | POA: Diagnosis not present

## 2023-12-20 DIAGNOSIS — Z23 Encounter for immunization: Secondary | ICD-10-CM | POA: Diagnosis not present

## 2023-12-20 DIAGNOSIS — F3161 Bipolar disorder, current episode mixed, mild: Secondary | ICD-10-CM | POA: Diagnosis not present

## 2023-12-20 DIAGNOSIS — Z7985 Long-term (current) use of injectable non-insulin antidiabetic drugs: Secondary | ICD-10-CM

## 2023-12-20 DIAGNOSIS — E669 Obesity, unspecified: Secondary | ICD-10-CM

## 2023-12-20 LAB — COMPREHENSIVE METABOLIC PANEL WITH GFR
ALT: 22 U/L (ref 0–35)
AST: 20 U/L (ref 0–37)
Albumin: 4.3 g/dL (ref 3.5–5.2)
Alkaline Phosphatase: 50 U/L (ref 39–117)
BUN: 13 mg/dL (ref 6–23)
CO2: 29 meq/L (ref 19–32)
Calcium: 9.2 mg/dL (ref 8.4–10.5)
Chloride: 103 meq/L (ref 96–112)
Creatinine, Ser: 0.93 mg/dL (ref 0.40–1.20)
GFR: 68.9 mL/min (ref >60.00)
Glucose, Bld: 112 mg/dL — ABNORMAL HIGH (ref 70–99)
Potassium: 3.8 meq/L (ref 3.5–5.1)
Sodium: 139 meq/L (ref 135–145)
Total Bilirubin: 0.6 mg/dL (ref 0.2–1.2)
Total Protein: 7.7 g/dL (ref 6.0–8.3)

## 2023-12-20 LAB — LIPID PANEL
Cholesterol: 150 mg/dL (ref 0–200)
HDL: 42.1 mg/dL (ref >39.00)
LDL Cholesterol: 80 mg/dL (ref 0–99)
NonHDL: 107.75
Total CHOL/HDL Ratio: 4
Triglycerides: 138 mg/dL (ref 0.0–149.0)
VLDL: 27.6 mg/dL (ref 0.0–40.0)

## 2023-12-20 MED ORDER — ARIPIPRAZOLE 5 MG PO TABS: 5.0000 mg | ORAL_TABLET | Freq: Every day | ORAL | 2 refills | Status: AC

## 2023-12-20 NOTE — Patient Instructions (Addendum)
 Please go downstairs for labs before you leave  Let me know when you take your third Mounjaro  2.5 mg injection and we can increase your dose to 5 mg or refill the current dose, whichever you prefer.  Continue staying well-hydrated  I will be in touch with your results  I records show that you are overdue for a diabetic eye exam and shingles vaccine.  Let me know if you get these done or if you have gotten them done recently and we will update your chart.

## 2023-12-20 NOTE — Progress Notes (Signed)
 Subjective:     Patient ID: Deborah Haney, female    DOB: 1965/06/18, 58 y.o.   MRN: [id number]  Chief Complaint  Patient presents with   Medical Management of Chronic Issues    6 week fu    HPI  Discussed the use of AI scribe software for clinical note transcription with the patient, who gave verbal consent to proceed.  History of Present Illness Deborah Haney is a 58 year old female with diabetes and hyperlipidemia who presents for a follow-up on chronic health conditions.  Glycemic control and antidiabetic therapy - Diabetes managed with Mounjaro  2.5 mg, completed four injections, last administered last Tuesday - Significant reduction in appetite - Weight loss of eight pounds - Nausea and constipation present, managed with supplements - Maintains adequate hydration  Lipid management - Rosuvastatin  initiated at last visit for hyperlipidemia - Fasting today to assess impact on cholesterol levels - No fatigue present - Overall improved sense of well-being  Mood stability and psychiatric medication - Bipolar disorder managed with Abilify for almost one year, prescribed by previous psychiatrist - On lowest dose of Abilify - Mood stability maintained - In process of establishing care with a new psychiatrist and needs refill of medication  Musculoskeletal pain and autoimmune evaluation - Referred to rheumatology for pain and elevated ANA and sed rate - Rheumatology appointment scheduled for end of the month  Gastrointestinal evaluation - Gastroenterology appointment scheduled  Dietary supplements and alternative therapies - Mushroom coffee added to routine - Improvement in energy, appetite suppression, and reduction in bloating attributed to mushroom coffee     Health Maintenance Due  Topic Date Due   Zoster Vaccines- Shingrix (1 of 2) Never done   OPHTHALMOLOGY EXAM  02/23/2022   COVID-19 Vaccine (4 - 2025-26 season) 11/06/2023    Past Medical History:   Diagnosis Date   Acute cholecystitis 07/14/2013   Acute on chronic respiratory failure with hypoxia (HCC) 09/05/2018   Anemia    has history, takes iron   Anxiety    Bipolar affective disorder (HCC) 10/26/2015   Has seen counselor, declined medications per medical record from [facility]. hypermanic   Chest pain 07/14/2013   Chronic vaginitis 10/26/2015   Has h/o vag dryness and tried Estrace per [facility] records   COVID-19 virus infection 08/24/2018   Depression    Diabetes (HCC)    Patient denies   Diverticulitis    no problems   Dysfunctional uterine bleeding 03/02/2011   Elevated hemoglobin A1c 10/26/2015   GERD (gastroesophageal reflux disease)    tums prn   Headache(784.0)    Hemorrhoids    HTN (hypertension)    BP meds since fall 2016   Internal hemorrhoids    Nausea & vomiting 07/14/2013   Obesity 10/26/2015   Perimenopausal symptom 04/30/2021   Rectal bleeding 03/15/2017    Past Surgical History:  Procedure Laterality Date   ABDOMINAL HYSTERECTOMY     CHOLECYSTECTOMY N/A 07/15/2013   Procedure: LAPAROSCOPIC CHOLECYSTECTOMY;  Surgeon: Donnice Bury, MD;  Location: [facility] OR;  Service: General;  Laterality: N/A;   COLONOSCOPY     ECTOPIC PREGNANCY SURGERY     laparotomy   HEMORRHOID BANDING     ROBOTIC ASSISTED LAP VAGINAL HYSTERECTOMY      Family History  Problem Relation Age of Onset   Diabetes Mother    Hypertension Mother    Arthritis Father 21   Hypertension Brother    Cancer Brother        blood  Cancer Brother        unsure kind, as a child    CVA Other    Colon cancer Neg Hx    Colon polyps Neg Hx    Esophageal cancer Neg Hx    Rectal cancer Neg Hx    Stomach cancer Neg Hx    Pancreatic cancer Neg Hx     Social History   Socioeconomic History   Marital status: Divorced    Spouse name: Not on file   Number of children: 0   Years of education: Not on file   Highest education level: Not on file  Occupational History   Occupation: Arts development officer, Dealer  Tobacco Use   Smoking status: Former    Current packs/day: 0.00    Types: Cigarettes    Quit date: 04/18/2008    Years since quitting: 15.6   Smokeless tobacco: Never  Vaping Use   Vaping status: Never Used  Substance and Sexual Activity   Alcohol use: Yes    Comment: 1 glass of wine rarely, occasionally   Drug use: Yes    Types: Marijuana    Comment: last use this am partial blunt, Former cocaine use since 2012   Sexual activity: Yes    Partners: Male    Birth control/protection: Surgical  Other Topics Concern   Not on file  Social History Narrative   Lives alone.     No children, no pets.   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   Social Drivers of Corporate investment banker Strain: Not on file  Food Insecurity: Not on file  Transportation Needs: Not on file  Physical Activity: Unknown (08/31/2018)   Exercise Vital Sign    Days of Exercise per Week: Patient declined    Minutes of Exercise per Session: Patient declined  Stress: Not on file  Social Connections: Unknown (08/31/2018)   Social Connection and Isolation Panel    Frequency of Communication with Friends and Family: Patient declined    Frequency of Social Gatherings with Friends and Family: Patient declined    Attends Religious Services: Patient declined    Database administrator or Organizations: Patient declined    Attends Banker Meetings: Patient declined    Marital Status: Patient declined  Intimate Partner Violence: Unknown (08/31/2018)   Humiliation, Afraid, Rape, and Kick questionnaire    Fear of Current or Ex-Partner: Patient declined    Emotionally Abused: Patient declined    Physically Abused: Patient declined    Sexually Abused: Patient declined    Outpatient Medications Prior to Visit  Medication Sig Dispense Refill   Accu-Chek Softclix Lancets lancets Use as instructed;  Check FSBS BID - Include strips # 50 with 5 refills,  Lancets #50 with 5 refills DX: Type 2 DM - ICD 10: E11.9 100 each 12   Blood Glucose Monitoring Suppl (CONTOUR MONITOR) w/Device KIT 1 kit by Does not apply route 2 (two) times a day. 1 kit 0   cholestyramine  (QUESTRAN ) 4 g packet MIX AND DRINK 1 PACKET DAILY 90 packet 0   clotrimazole -betamethasone  (LOTRISONE ) cream Apply 1 Application topically daily. 30 g 0   estradiol (VIVELLE-DOT) 0.025 MG/24HR APPLY 1 PATCH TOPICALLY TWICE A WEEK     fluticasone  (FLONASE ) 50 MCG/ACT nasal spray Place 2 sprays into both nostrils daily. 16 g 6   glucose blood (CONTOUR NEXT TEST) test strip Use as instructed 100 each 0   lisinopril -hydrochlorothiazide  (ZESTORETIC ) 10-12.5 MG tablet  Take 1 tablet by mouth daily. 90 tablet 1   mupirocin  cream (BACTROBAN ) 2 % Apply 1 application. topically 2 (two) times daily. 15 g 1   naproxen  (NAPROSYN ) 375 MG tablet TAKE 1 TABLET BY MOUTH TWICE DAILY FOR 14 DAYS     ondansetron  (ZOFRAN -ODT) 4 MG disintegrating tablet Take 1 tablet (4 mg total) by mouth every 8 (eight) hours as needed for nausea or vomiting. 20 tablet 0   Probiotic Product (PROBIOTIC-10 PO) Take 1 mg by mouth 1 day or 1 dose. Pt. Takes every other day     rosuvastatin  (CRESTOR ) 5 MG tablet Take 1 tablet (5 mg total) by mouth daily. 90 tablet 1   tirzepatide  (MOUNJARO ) 2.5 MG/0.5ML Pen Inject 2.5 mg into the skin once a week. 2 mL 1   VITAMIN D  PO Take by mouth.     ARIPiprazole (ABILIFY) 5 MG tablet Take 5 mg by mouth daily.     Vitamin D , Ergocalciferol , (DRISDOL ) 1.25 MG (50000 UNIT) CAPS capsule Take 1 capsule (50,000 Units total) by mouth every 7 (seven) days. 6 capsule 0   No facility-administered medications prior to visit.    Allergies  Allergen Reactions   Augmentin  [Amoxicillin -Pot Clavulanate] Rash    Review of Systems  Constitutional:  Positive for weight loss. Negative for chills, fever and malaise/fatigue.  Respiratory:  Negative for shortness of breath.   Cardiovascular:  Negative for  chest pain, palpitations and leg swelling.  Gastrointestinal:  Positive for constipation and nausea. Negative for abdominal pain, diarrhea and vomiting.  Genitourinary:  Negative for dysuria, frequency and urgency.  Neurological:  Negative for dizziness, focal weakness and headaches.  Psychiatric/Behavioral:  Negative for depression and suicidal ideas. The patient is not nervous/anxious.        Objective:    Physical Exam Constitutional:      General: She is not in acute distress.    Appearance: She is not ill-appearing.  Eyes:     Extraocular Movements: Extraocular movements intact.     Conjunctiva/sclera: Conjunctivae normal.  Cardiovascular:     Rate and Rhythm: Normal rate.  Pulmonary:     Effort: Pulmonary effort is normal.  Musculoskeletal:     Cervical back: Normal range of motion and neck supple.  Skin:    General: Skin is warm and dry.  Neurological:     General: No focal deficit present.     Mental Status: She is alert and oriented to person, place, and time.     Motor: No weakness.     Coordination: Coordination normal.     Gait: Gait normal.  Psychiatric:        Mood and Affect: Mood normal.        Behavior: Behavior normal.        Thought Content: Thought content normal.      BP 132/82   Pulse 81   Temp 97.6 F (36.4 C) (Temporal)   Ht 5' 6 (1.676 m)   Wt 200 lb (90.7 kg)   LMP 04/25/2011   SpO2 98%   BMI 32.28 kg/m  Wt Readings from Last 3 Encounters:  12/20/23 200 lb (90.7 kg)  11/15/23 208 lb (94.3 kg)  11/08/23 211 lb (95.7 kg)       Assessment & Plan:   Problem List Items Addressed This Visit     Bipolar affective disorder (HCC)   Essential hypertension   Relevant Orders   Comprehensive metabolic panel with GFR (Completed)   Obesity (BMI 30.0-34.9)  Relevant Orders   Comprehensive metabolic panel with GFR (Completed)   Lipid panel (Completed)   Other Visit Diagnoses       Type 2 diabetes mellitus in patient with obesity (HCC)     -  Primary   Relevant Orders   Comprehensive metabolic panel with GFR (Completed)     Hyperlipidemia associated with type 2 diabetes mellitus (HCC)       Relevant Orders   Lipid panel (Completed)     Immunization due       Relevant Orders   Flu vaccine trivalent PF, 6mos and older(Flulaval,Afluria,Fluarix,Fluzone) (Completed)       Assessment and Plan Assessment & Plan Type 2 diabetes mellitus with obesity  Continued management with Mounjaro  (tirzepatide ) for diabetes. Reports significant appetite suppression and an 8-pound weight loss. Experiencing mild nausea and constipation, managed with supplements. Current dose is 2.5 mg, with plans to increase to 5 mg after another month if tolerated. - Continue Mounjaro  (tirzepatide ) at 2.5 mg for one more month - Increase Mounjaro  to 5 mg after one month if prefers and 90 day refill can be provided - Ensure adequate hydration and monitor for side effects  Mixed hyperlipidemia associated with type 2 diabetes mellitus Started on rosuvastatin  (Crestor ) since last visit. No reported side effects such as tiredness or increase myalgias.  Awaiting lab results to assess cholesterol levels. - Continue rosuvastatin  (Crestor ) - check fasting lipids  Bipolar disorder, stable on medication Well-managed on aripiprazole (Abilify) for almost a year. No current psychiatrist due to previous psychiatrist's unavailability. Actively seeking a new psychiatrist but facing challenges due to professional overlap with potential providers. Medication is effective, and she is stable. - Refill aripiprazole (Abilify) with 30 tablets and 2 refills - Encourage finding a new psychiatrist within three months - Monitor for any changes in mental health status     I have discontinued Deborah SAILOR. Creswell's Vitamin D  (Ergocalciferol ). I have also changed her ARIPiprazole. Additionally, I am having her maintain her Contour Monitor, Probiotic Product (PROBIOTIC-10 PO), Accu-Chek  Softclix Lancets, mupirocin  cream, cholestyramine , fluticasone , estradiol, naproxen , Contour Next Test, lisinopril -hydrochlorothiazide , clotrimazole -betamethasone , tirzepatide , rosuvastatin , ondansetron , and VITAMIN D  PO.  Meds ordered this encounter  Medications   ARIPiprazole (ABILIFY) 5 MG tablet    Sig: Take 1 tablet (5 mg total) by mouth daily.    Dispense:  30 tablet    Refill:  2    Supervising Provider:   ROLLENE NORRIS A [[id number]]

## 2023-12-26 DIAGNOSIS — F439 Reaction to severe stress, unspecified: Secondary | ICD-10-CM | POA: Diagnosis not present

## 2023-12-28 MED ORDER — TIRZEPATIDE 2.5 MG/0.5ML ~~LOC~~ SOAJ: 2.5000 mg | SUBCUTANEOUS | 1 refills | Status: DC

## 2023-12-28 NOTE — Addendum Note (Signed)
 Addended by: Caitrin Pendergraph E on: 12/28/2023 11:38 AM   Modules accepted: Orders

## 2023-12-29 ENCOUNTER — Other Ambulatory Visit (HOSPITAL_COMMUNITY): Payer: Self-pay

## 2023-12-29 ENCOUNTER — Telehealth: Payer: Self-pay

## 2023-12-29 NOTE — Telephone Encounter (Signed)
 For mounjaro - per patient bc I am staying on the same dose, you guy need to do a quantity limit prior authorization. This form has been faxed over to you guy and if you have a problem access it to call [phone]

## 2023-12-29 NOTE — Telephone Encounter (Signed)
**Note De-identified  [place] Obfuscation** Please advise 

## 2023-12-29 NOTE — Telephone Encounter (Signed)
 Clinical questions answered and PA submitted.

## 2023-12-29 NOTE — Telephone Encounter (Signed)
 Pharmacy Patient Advocate Encounter   Received notification from Patient Advice Request messages that prior authorization for Mounjaro  2.5mg /0.15ml is required/requested.   Insurance verification completed.   The patient is insured through [facility].   Per test claim: PA required; PA started via CoverMyMeds. KEY [id number] . Please see clinical question(s) below that I am not finding the answer to in their chart and advise.

## 2024-01-01 ENCOUNTER — Other Ambulatory Visit (HOSPITAL_COMMUNITY): Payer: Self-pay

## 2024-01-01 NOTE — Telephone Encounter (Signed)
 Pharmacy Patient Advocate Encounter  Received notification from [facility] that Prior Authorization for Mounjaro  2.5mg /0.73ml has been DENIED.  Denial letter has been uploaded to media tab   PA #/Case ID/Reference #: [id number]

## 2024-01-02 ENCOUNTER — Ambulatory Visit: Admitting: Gastroenterology

## 2024-01-02 ENCOUNTER — Encounter: Payer: Self-pay | Admitting: Gastroenterology

## 2024-01-02 VITALS — BP 130/74 | HR 72 | Ht 66.0 in | Wt 204.0 lb

## 2024-01-02 DIAGNOSIS — K625 Hemorrhage of anus and rectum: Secondary | ICD-10-CM

## 2024-01-02 DIAGNOSIS — K648 Other hemorrhoids: Secondary | ICD-10-CM | POA: Diagnosis not present

## 2024-01-02 DIAGNOSIS — K5900 Constipation, unspecified: Secondary | ICD-10-CM | POA: Diagnosis not present

## 2024-01-02 DIAGNOSIS — F439 Reaction to severe stress, unspecified: Secondary | ICD-10-CM | POA: Diagnosis not present

## 2024-01-02 MED ORDER — HYDROCORTISONE (PERIANAL) 2.5 % EX CREA: 1.0000 | TOPICAL_CREAM | Freq: Two times a day (BID) | CUTANEOUS | 2 refills | Status: AC

## 2024-01-02 NOTE — Patient Instructions (Addendum)
 We have sent the following medications to your pharmacy for you to pick up at your convenience: Hydrocortisone  2.5 % cream - Cover Preparation H suppository two times daily 5-7 days as needed.  Start Miralax 1/2 capful daily in 8 ounces of liquid.  _______________________________________________________  If your blood pressure at your visit was 140/90 or greater, please contact your primary care physician to follow up on this.  _______________________________________________________  If you are age 73 or older, your body mass index should be between 23-30. Your Body mass index is 32.93 kg/m. If this is out of the aforementioned range listed, please consider follow up with your Primary Care Provider.  If you are age 54 or younger, your body mass index should be between 19-25. Your Body mass index is 32.93 kg/m. If this is out of the aformentioned range listed, please consider follow up with your Primary Care Provider.   ________________________________________________________  The  GI providers would like to encourage you to use MYCHART to communicate with providers for non-urgent requests or questions.  Due to long hold times on the telephone, sending your provider a message by [facility] may be a faster and more efficient way to get a response.  Please allow 48 business hours for a response.  Please remember that this is for non-urgent requests.  _______________________________________________________  [facility] is using a team-based approach to care.  Your team is made up of your doctor and two to three APPS. Our APPS (Nurse Practitioners and Physician Assistants) work with your physician to ensure care continuity for you. They are fully qualified to address your health concerns and develop a treatment plan. They communicate directly with your gastroenterologist to care for you. Seeing the Advanced Practice Practitioners on your physician's team can help you by facilitating  care more promptly, often allowing for earlier appointments, access to diagnostic testing, procedures, and other specialty referrals.

## 2024-01-02 NOTE — Progress Notes (Signed)
 01/02/2024 Deborah Haney [id number] Jul 05, 1965   Discussed the use of AI scribe software for clinical note transcription with the patient, who gave verbal consent to proceed.  History of Present Illness Deborah Haney is a 58 year old female who presents with rectal bleeding and constipation.  She has been experiencing intermittent rectal bleeding since her last visit, with a significant amount of bright red blood in her stool. The bleeding was substantial over the past month but has decreased and even stopped recently. The blood appears either with the stool or after a bowel movement. She attributes the bleeding to hemorrhoids, which she has not treated with any medication previously.  She is currently taking Mounjaro , which she notes has caused constipation. Previously, she experienced loose stools and bleeding, but now reports having bowel movements about three times a week, which are hard and require straining, however. She only uses Miralax once a week to aid bowel movements.  She underwent hemorrhoid banding in 2021. She has not used any prescription treatments for hemorrhoids before.  She experiences abdominal pain and cramping associated with bowel movements, which she manages with physical therapy techniques such as abdominal massage. No abdominal pain outside of bowel movements.   Colonoscopy 11/2021:  - The examined portion of the ileum was normal. - One 3 mm polyp in the sigmoid colon, removed with a cold snare. Resected and retrieved. - Non- bleeding internal hemorrhoids. - Biopsies were taken with a cold forceps from the entire colon for evaluation of microscopic colitis.  4. Surgical [P], colon nos, random sites - BENIGN COLONIC MUCOSA WITH NO SIGNIFICANT PATHOLOGIC CHANGES - NEGATIVE FOR INCREASED INTRAEPITHELIAL LYMPHOCYTES OR THICKENED SUBEPITHELIAL COLLAGEN TABLE - NEGATIVE FOR DYSPLASIA OR MALIGNANCY 5. Surgical [P], colon, sigmoid, polyp (1) - HYPERPLASTIC  POLYP. - NO DYSPLASIA OR MALIGNANCY.    Past Medical History:  Diagnosis Date   Acute cholecystitis 07/14/2013   Acute on chronic respiratory failure with hypoxia (HCC) 09/05/2018   Anemia    has history, takes iron   Anxiety    Bipolar affective disorder (HCC) 10/26/2015   Has seen counselor, declined medications per medical record from [facility]. hypermanic   Chest pain 07/14/2013   Chronic vaginitis 10/26/2015   Has h/o vag dryness and tried Estrace per [facility] records   COVID-19 virus infection 08/24/2018   Depression    Diabetes (HCC)    Patient denies   Diverticulitis    no problems   Dysfunctional uterine bleeding 03/02/2011   Elevated hemoglobin A1c 10/26/2015   GERD (gastroesophageal reflux disease)    tums prn   Headache(784.0)    Hemorrhoids    HTN (hypertension)    BP meds since fall 2016   Internal hemorrhoids    Nausea & vomiting 07/14/2013   Obesity 10/26/2015   Perimenopausal symptom 04/30/2021   Rectal bleeding 03/15/2017   Past Surgical History:  Procedure Laterality Date   ABDOMINAL HYSTERECTOMY     CHOLECYSTECTOMY N/A 07/15/2013   Procedure: LAPAROSCOPIC CHOLECYSTECTOMY;  Surgeon: Donnice Bury, MD;  Location: [facility] OR;  Service: General;  Laterality: N/A;   COLONOSCOPY     ECTOPIC PREGNANCY SURGERY     laparotomy   HEMORRHOID BANDING     ROBOTIC ASSISTED LAP VAGINAL HYSTERECTOMY      reports that she quit smoking about 15 years ago. Her smoking use included cigarettes. She has never used smokeless tobacco. She reports current alcohol use. She reports current drug use. Drug: Marijuana. family history includes Arthritis (age  of onset: 17) in her father; CVA in an other family member; Cancer in her brother and brother; Diabetes in her mother; Hypertension in her brother and mother. Allergies  Allergen Reactions   Augmentin  [Amoxicillin -Pot Clavulanate] Rash      Outpatient Encounter Medications as of 01/02/2024  Medication Sig   Accu-Chek  Softclix Lancets lancets Use as instructed;  Check FSBS BID - Include strips # 50 with 5 refills, Lancets #50 with 5 refills DX: Type 2 DM - ICD 10: E11.9   ARIPiprazole (ABILIFY) 5 MG tablet Take 1 tablet (5 mg total) by mouth daily.   Blood Glucose Monitoring Suppl (CONTOUR MONITOR) w/Device KIT 1 kit by Does not apply route 2 (two) times a day.   cholestyramine  (QUESTRAN ) 4 g packet MIX AND DRINK 1 PACKET DAILY   clotrimazole -betamethasone  (LOTRISONE ) cream Apply 1 Application topically daily.   estradiol (VIVELLE-DOT) 0.025 MG/24HR APPLY 1 PATCH TOPICALLY TWICE A WEEK   fluticasone  (FLONASE ) 50 MCG/ACT nasal spray Place 2 sprays into both nostrils daily.   gabapentin  (NEURONTIN ) 100 MG capsule Take 100 mg by mouth 3 (three) times daily.   glucose blood (CONTOUR NEXT TEST) test strip Use as instructed   lisinopril -hydrochlorothiazide  (ZESTORETIC ) 10-12.5 MG tablet Take 1 tablet by mouth daily.   mupirocin  cream (BACTROBAN ) 2 % Apply 1 application. topically 2 (two) times daily.   naproxen  (NAPROSYN ) 375 MG tablet TAKE 1 TABLET BY MOUTH TWICE DAILY FOR 14 DAYS   ondansetron  (ZOFRAN -ODT) 4 MG disintegrating tablet Take 1 tablet (4 mg total) by mouth every 8 (eight) hours as needed for nausea or vomiting.   Probiotic Product (PROBIOTIC-10 PO) Take 1 mg by mouth 1 day or 1 dose. Pt. Takes every other day   rosuvastatin  (CRESTOR ) 5 MG tablet Take 1 tablet (5 mg total) by mouth daily.   tirzepatide  (MOUNJARO ) 2.5 MG/0.5ML Pen Inject 2.5 mg into the skin once a week.   VITAMIN D  PO Take by mouth.   No facility-administered encounter medications on file as of 01/02/2024.     REVIEW OF SYSTEMS  : All other systems reviewed and negative except where noted in the History of Present Illness.   PHYSICAL EXAM: BP 130/74 (BP Location: Left Arm, Patient Position: Sitting, Cuff Size: Normal)   Pulse 72   Ht 5' 6 (1.676 m)   Wt 204 lb (92.5 kg)   LMP 04/25/2011   BMI 32.93 kg/m  General: Well  developed AA female in no acute distress Head: Normocephalic and atraumatic Eyes:  Sclerae anicteric, conjunctiva pink. Ears: Normal auditory acuity Lungs: Clear throughout to auscultation; no W/R/R. Heart: Regular rate and rhythm; no M/R/G. Abdomen:  Soft, non-distended.  BS present.  Non-tender. Musculoskeletal: Symmetrical with no gross deformities  Skin: No lesions on visible extremities Extremities: No edema  Neurological: Alert oriented x 4, grossly non-focal Psychological:  Alert and cooperative. Normal mood and affect Assessment & Plan Internal hemorrhoids with rectal bleeding Intermittent rectal bleeding likely due to internal hemorrhoids, exacerbated by straining and hard stools. Previous colonoscopy showed internal hemorrhoids and a benign polyp. Hemoglobin levels are normal, indicating no significant blood loss. Bleeding occurs approximately one to two weeks per month then resolves. Hydrocortisone  suppositories are expected to reduce bleeding duration and severity.  - Prescribed hydrocortisone  cream for use with over-the-counter Preparation H suppositories. - Instructed to use suppositories at bedtime and possibly after bowel movements. - Advised to use treatment for approximately five days during bleeding episodes. - Discussed potential for hemorrhoid banding if conservative  measures are ineffective.  Constipation Chronic constipation, exacerbated by Mounjaro  use, leading to hard stools and straining. Miralax is used only once a week, which is insufficient. Increasing Miralax frequency is recommended to maintain softer stools and prevent straining. - Increase Miralax to half a capful daily or every other day to maintain softer stools. - Monitor bowel movement frequency and consistency and titrate the miralax as needed.   CC:  Henson, Vickie L, NP-C

## 2024-01-02 NOTE — Progress Notes (Unsigned)
 Office Visit Note  [name]             Date of Birth: 07-25-1965           MRN: [id number]             PCP: Lendia Boby CROME, NP-C Referring: Lendia Boby CROME, NP-C Visit Date: 01/04/2024 Occupation: @GUAROCC @  Subjective:  No chief complaint on file.    History of Present Illness: Deborah Haney is a 58 y.o. female ***   Activities of Daily Living:  Patient reports morning stiffness for *** {minute/hour:19697}.   Patient {ACTIONS;DENIES/REPORTS:21021675::Denies} nocturnal pain.  Difficulty dressing/grooming: {ACTIONS;DENIES/REPORTS:21021675::Denies} Difficulty climbing stairs: {ACTIONS;DENIES/REPORTS:21021675::Denies} Difficulty getting out of chair: {ACTIONS;DENIES/REPORTS:21021675::Denies} Difficulty using hands for taps, buttons, cutlery, and/or writing: {ACTIONS;DENIES/REPORTS:21021675::Denies}  No Rheumatology ROS completed.    Rheum History: # Diagnosed in ***.  Manifestation of disease:   Serologies: (+) *** (-) ***  Maintenance Labs: QuantiFERON: *** Hepatitis panel: ***  Current Treatment ***  Prior Treatments ***   PMFS History:  Patient Active Problem List   Diagnosis Date Noted   ANA positive 11/08/2023   Trigger finger, left index finger 10/17/2023   Morning joint stiffness of hand, left 10/17/2023   Numbness and tingling in both hands 10/17/2023   Controlled type 2 diabetes mellitus with diabetic neuropathy, without long-term current use of insulin  (HCC) 10/17/2023   Rash and nonspecific skin eruption 02/17/2023   Easy bruising 02/17/2023   Hyperpigmentation 02/17/2023   Foot pain, bilateral 02/17/2023   1st degree AV block 10/14/2022   Chronic pain of right lower extremity 10/14/2022   Hot flashes due to menopause 10/14/2022   Diarrhea 01/24/2022   History of sexual behavior with high risk of exposure to communicable disease 01/24/2022   Vaginal discharge 01/24/2022   Anemia 11/16/2021   Diverticulitis  11/16/2021   Gastroesophageal reflux disease 11/16/2021   Abscess 11/16/2021   Fatigue 11/16/2021   Bloating 07/07/2021   Rectal bleeding 07/07/2021   Irritable bowel syndrome 07/07/2021   H/O: hysterectomy 05/13/2021   Menopausal symptoms 05/13/2021   Elevated blood sugar 12/17/2020   Encounter for general adult medical examination with abnormal findings 12/17/2020   Type 2 diabetes mellitus with hyperglycemia, without long-term current use of insulin  (HCC) 12/17/2020   Mixed dyslipidemia 12/17/2020   Situational depression 04/24/2020   Vitamin D  deficiency 04/24/2020   Burning with urination 09/25/2019   Urinary frequency 09/25/2019   Gross hematuria 09/25/2019   Internal bleeding hemorrhoids 08/30/2019   Change in bowel habit 08/30/2019   Loose stools 08/30/2019   Paresthesia of both lower extremities 11/14/2018   Elevated LDL cholesterol level 10/17/2018   Proteinuria 10/17/2018   History of 2019 novel coronavirus disease (COVID-19) 09/24/2018   History of pneumonia 09/24/2018   Controlled type 2 diabetes mellitus without complication, without long-term current use of insulin  (HCC) 09/11/2018   Acute cough 06/12/2018   Allergic rhinitis due to pollen 06/12/2018   Acute cystitis with hematuria 03/05/2018   Dyslipidemia 06/05/2017   Essential hypertension 10/26/2015   Obesity (BMI 30.0-34.9) 10/26/2015   Chronic vaginitis 10/26/2015   Bipolar affective disorder (HCC) 10/26/2015   Atypical chest pain 07/14/2013   Dysfunctional uterine bleeding 03/02/2011    Past Medical History:  Diagnosis Date   Acute cholecystitis 07/14/2013   Acute on chronic respiratory failure with hypoxia (HCC) 09/05/2018   Anemia    has history, takes iron   Anxiety    Bipolar affective disorder (HCC) 10/26/2015   Has seen  counselor, declined medications per medical record from [facility]. hypermanic   Chest pain 07/14/2013   Chronic vaginitis 10/26/2015   Has h/o vag dryness and tried Estrace per  [facility] records   COVID-19 virus infection 08/24/2018   Depression    Diabetes (HCC)    Patient denies   Diverticulitis    no problems   Dysfunctional uterine bleeding 03/02/2011   Elevated hemoglobin A1c 10/26/2015   GERD (gastroesophageal reflux disease)    tums prn   Headache(784.0)    Hemorrhoids    HTN (hypertension)    BP meds since fall 2016   Internal hemorrhoids    Nausea & vomiting 07/14/2013   Obesity 10/26/2015   Perimenopausal symptom 04/30/2021   Rectal bleeding 03/15/2017    Family History  Problem Relation Age of Onset   Diabetes Mother    Hypertension Mother    Arthritis Father 39   Hypertension Brother    Cancer Brother        blood    Cancer Brother        unsure kind, as a child    CVA Other    Colon cancer Neg Hx    Colon polyps Neg Hx    Esophageal cancer Neg Hx    Rectal cancer Neg Hx    Stomach cancer Neg Hx    Pancreatic cancer Neg Hx    Past Surgical History:  Procedure Laterality Date   ABDOMINAL HYSTERECTOMY     CHOLECYSTECTOMY N/A 07/15/2013   Procedure: LAPAROSCOPIC CHOLECYSTECTOMY;  Surgeon: Donnice Bury, MD;  Location: [facility] OR;  Service: General;  Laterality: N/A;   COLONOSCOPY     ECTOPIC PREGNANCY SURGERY     laparotomy   HEMORRHOID BANDING     ROBOTIC ASSISTED LAP VAGINAL HYSTERECTOMY     Social History   Social History Narrative   Lives alone.     No children, no pets.   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   Immunization History  Administered Date(s) Administered   Hepatitis B, ADULT 12/28/2015, 05/27/2016   Hepatitis B, PED/ADOLESCENT 11/17/2015   Influenza Split 01/06/2011, 12/17/2014   Influenza, Seasonal, Injecte, Preservative Fre 05/03/2023, 12/20/2023   Influenza,inj,Quad PF,6+ Mos 01/18/2016, 02/02/2017, 12/12/2018   PFIZER(Purple Top)SARS-COV-2 Vaccination 03/26/2019, 04/16/2019, 02/25/2020   PNEUMOCOCCAL CONJUGATE-20 10/17/2023   PPD Test 11/13/2015, 04/02/2018    Pneumococcal Conjugate-13 11/14/2018   Tdap 01/18/2016     Objective: Vital Signs: LMP 04/25/2011    Physical Exam Vitals and nursing note reviewed.  HENT:     Head: Normocephalic and atraumatic.     Nose: Nose normal.  Eyes:     Conjunctiva/sclera: Conjunctivae normal.     Pupils: Pupils are equal, round, and reactive to light.  Cardiovascular:     Rate and Rhythm: Normal rate and regular rhythm.     Heart sounds: Normal heart sounds.  Pulmonary:     Effort: Pulmonary effort is normal.     Breath sounds: Normal breath sounds.  Skin:    General: Skin is warm and dry.  Neurological:     Mental Status: She is alert. Mental status is at baseline.  Psychiatric:        Mood and Affect: Mood normal.        Behavior: Behavior normal.      Musculoskeletal Exam: ***  CDAI Exam: CDAI Score: -- Patient Global: --; Provider Global: -- Swollen: --; Tender: -- Joint Exam 01/04/2024   No joint exam has been documented for this  visit   There is currently no information documented on the homunculus. Go to the Rheumatology activity and complete the homunculus joint exam.  Investigation: No additional findings.  Imaging: No results found.  Recent Labs: Lab Results  Component Value Date   WBC 6.0 10/17/2023   HGB 13.2 10/17/2023   PLT 231.0 10/17/2023   NA 139 12/20/2023   K 3.8 12/20/2023   CL 103 12/20/2023   CO2 29 12/20/2023   GLUCOSE 112 (H) 12/20/2023   BUN 13 12/20/2023   CREATININE 0.93 12/20/2023   BILITOT 0.6 12/20/2023   ALKPHOS 50 12/20/2023   AST 20 12/20/2023   ALT 22 12/20/2023   PROT 7.7 12/20/2023   ALBUMIN 4.3 12/20/2023   CALCIUM  9.2 12/20/2023   GFRAA 84 04/24/2020   Lab Results  Component Value Date   ANA POSITIVE (A) 10/17/2023   RF <10 10/17/2023    Speciality Comments: No specialty comments available.  Procedures:  No procedures performed Allergies: Augmentin  [amoxicillin -pot clavulanate]   Assessment / Plan:     Visit Diagnoses:  No diagnosis found.  #High risk medication use  Orders: No orders of the defined types were placed in this encounter.  No orders of the defined types were placed in this encounter.   I personally spent a total of *** minutes in the care of the patient today including {Time Based Coding:210964241}.  Follow-Up Instructions: No follow-ups on file.   Asberry Claw, DO

## 2024-01-04 ENCOUNTER — Ambulatory Visit
Admission: RE | Admit: 2024-01-04 | Discharge: 2024-01-04 | Disposition: A | Discharge status: Home / Self Care | Source: Ambulatory Visit

## 2024-01-04 ENCOUNTER — Ambulatory Visit

## 2024-01-04 VITALS — BP 114/76 | HR 79 | Temp 97.8°F | Resp 14 | Ht 66.0 in | Wt 200.2 lb

## 2024-01-04 DIAGNOSIS — M2550 Pain in unspecified joint: Secondary | ICD-10-CM

## 2024-01-04 DIAGNOSIS — M19042 Primary osteoarthritis, left hand: Secondary | ICD-10-CM | POA: Diagnosis not present

## 2024-01-04 DIAGNOSIS — R7689 Other specified abnormal immunological findings in serum: Secondary | ICD-10-CM | POA: Diagnosis not present

## 2024-01-04 DIAGNOSIS — M19041 Primary osteoarthritis, right hand: Secondary | ICD-10-CM | POA: Diagnosis not present

## 2024-01-05 ENCOUNTER — Other Ambulatory Visit (HOSPITAL_COMMUNITY): Payer: Self-pay

## 2024-01-06 LAB — CYCLIC CITRUL PEPTIDE ANTIBODY, IGG: Cyclic Citrullin Peptide Ab: <16 U

## 2024-01-06 LAB — SJOGREN'S SYNDROME ANTIBODS(SSA + SSB)
SSA (Ro) (ENA) Antibody, IgG: <1 AI
SSB (La) (ENA) Antibody, IgG: <1 AI

## 2024-01-10 DIAGNOSIS — F439 Reaction to severe stress, unspecified: Secondary | ICD-10-CM | POA: Diagnosis not present

## 2024-01-16 ENCOUNTER — Other Ambulatory Visit: Payer: Self-pay | Admitting: Family Medicine

## 2024-01-16 MED ORDER — TIRZEPATIDE 5 MG/0.5ML ~~LOC~~ SOAJ: 5.0000 mg | SUBCUTANEOUS | 2 refills | Status: DC

## 2024-01-16 NOTE — Telephone Encounter (Signed)
 Pt wanted to do current dose for another month and then you said we can move to 5 mg. Pt ok with moving up now

## 2024-01-18 ENCOUNTER — Encounter: Payer: Self-pay | Admitting: Family Medicine

## 2024-01-18 DIAGNOSIS — F439 Reaction to severe stress, unspecified: Secondary | ICD-10-CM | POA: Diagnosis not present

## 2024-01-18 NOTE — Telephone Encounter (Signed)
 fyi

## 2024-01-24 DIAGNOSIS — F439 Reaction to severe stress, unspecified: Secondary | ICD-10-CM | POA: Diagnosis not present

## 2024-01-25 NOTE — Progress Notes (Signed)
 Office Visit Note  [name]             Date of Birth: [birth year]           MRN: [id number]             PCP: Lendia Boby CROME, NP-C Referring: Lendia Boby CROME, NP-C Visit Date: 02/05/2024 Occupation: Data Unavailable  Subjective:  Results (Patient states she is having trouble with her hands. )   History of Present Illness: Deborah Haney is a 58 y.o. female who is presenting for new patient follow-up. She was last seen 01/04/24 as a new patient for positive ANA. At that time, further work-up performed with differential concerning for osteoarthritis v seronegative RA.  Discussed XR's b/l hands w/ significant OA, no erosions or evidence of inflammatory arthritis. Also discussed negative CCP and sjogren's abs.  She continues to admit to AM stiffness lasting ~45 minutes. She does feel like her hands are swollen due to her rings fitting (they are lose at baseline). She also admits to continued numbness/tingling in her bilateral 1st-2nd digits, that occasionally wakes her from sleep.   Activities of Daily Living:  Patient reports morning stiffness for 45 minutes.   Patient Reports nocturnal pain.  Difficulty dressing/grooming: Reports Difficulty climbing stairs: Denies Difficulty getting out of chair: Denies Difficulty using hands for taps, buttons, cutlery, and/or writing: Reports  Review of Systems  Constitutional:  Positive for fatigue.  HENT:  Positive for mouth dryness. Negative for mouth sores.   Eyes:  Positive for dryness.  Respiratory:  Negative for shortness of breath.   Cardiovascular:  Negative for chest pain and palpitations.  Gastrointestinal:  Positive for blood in stool. Negative for constipation and diarrhea.  Endocrine: Positive for increased urination.  Genitourinary:  Negative for involuntary urination.  Musculoskeletal:  Positive for joint pain, joint pain and morning stiffness. Negative for gait problem, joint swelling, myalgias, muscle  weakness, muscle tenderness and myalgias.  Skin:  Positive for color change and rash. Negative for hair loss and sensitivity to sunlight.  Allergic/Immunologic: Positive for susceptible to infections.  Neurological:  Positive for headaches. Negative for dizziness.  Hematological:  Negative for swollen glands.  Psychiatric/Behavioral:  Positive for depressed mood and sleep disturbance. The patient is nervous/anxious.     PMFS History:  Patient Active Problem List   Diagnosis Date Noted   ANA positive 11/08/2023   Trigger finger, left index finger 10/17/2023   Morning joint stiffness of hand, left 10/17/2023   Numbness and tingling in both hands 10/17/2023   Controlled type 2 diabetes mellitus with diabetic neuropathy, without long-term current use of insulin  (HCC) 10/17/2023   Rash and nonspecific skin eruption 02/17/2023   Easy bruising 02/17/2023   Hyperpigmentation 02/17/2023   Foot pain, bilateral 02/17/2023   1st degree AV block 10/14/2022   Chronic pain of right lower extremity 10/14/2022   Hot flashes due to menopause 10/14/2022   Diarrhea 01/24/2022   History of sexual behavior with high risk of exposure to communicable disease 01/24/2022   Vaginal discharge 01/24/2022   Anemia 11/16/2021   Diverticulitis 11/16/2021   Gastroesophageal reflux disease 11/16/2021   Abscess 11/16/2021   Fatigue 11/16/2021   Bloating 07/07/2021   Rectal bleeding 07/07/2021   Irritable bowel syndrome 07/07/2021   H/O: hysterectomy 05/13/2021   Menopausal symptoms 05/13/2021   Elevated blood sugar 12/17/2020   Encounter for general adult medical examination with abnormal findings 12/17/2020   Type 2 diabetes mellitus with hyperglycemia, without  long-term current use of insulin  (HCC) 12/17/2020   Mixed dyslipidemia 12/17/2020   Situational depression 04/24/2020   Vitamin D  deficiency 04/24/2020   Burning with urination 09/25/2019   Urinary frequency 09/25/2019   Gross hematuria 09/25/2019    Internal bleeding hemorrhoids 08/30/2019   Constipation 08/30/2019   Loose stools 08/30/2019   Paresthesia of both lower extremities 11/14/2018   Elevated LDL cholesterol level 10/17/2018   Proteinuria 10/17/2018   History of 2019 novel coronavirus disease (COVID-19) 09/24/2018   History of pneumonia 09/24/2018   Controlled type 2 diabetes mellitus without complication, without long-term current use of insulin  (HCC) 09/11/2018   Acute cough 06/12/2018   Allergic rhinitis due to pollen 06/12/2018   Acute cystitis with hematuria 03/05/2018   Dyslipidemia 06/05/2017   Essential hypertension 10/26/2015   Obesity (BMI 30.0-34.9) 10/26/2015   Chronic vaginitis 10/26/2015   Bipolar affective disorder (HCC) 10/26/2015   Atypical chest pain 07/14/2013   Dysfunctional uterine bleeding 03/02/2011    Past Medical History:  Diagnosis Date   Acute cholecystitis 07/14/2013   Acute on chronic respiratory failure with hypoxia (HCC) 09/05/2018   Anemia    has history, takes iron   Anxiety    Bipolar affective disorder (HCC) 10/26/2015   Has seen counselor, declined medications per medical record from [facility]. hypermanic   Chest pain 07/14/2013   Chronic vaginitis 10/26/2015   Has h/o vag dryness and tried Estrace per [facility] records   COVID-19 virus infection 08/24/2018   Depression    Diabetes (HCC)    Patient denies   Diverticulitis    no problems   Dysfunctional uterine bleeding 03/02/2011   Elevated hemoglobin A1c 10/26/2015   GERD (gastroesophageal reflux disease)    tums prn   Headache(784.0)    Hemorrhoids    HTN (hypertension)    BP meds since fall 2016   Internal hemorrhoids    Nausea & vomiting 07/14/2013   Obesity 10/26/2015   Perimenopausal symptom 04/30/2021   Rectal bleeding 03/15/2017    Family History  Problem Relation Age of Onset   Diabetes Mother    Hypertension Mother    Arthritis Father 73   Hypertension Brother    Cancer Brother        blood    Cancer  Brother        unsure kind, as a child    CVA Other    Colon cancer Neg Hx    Colon polyps Neg Hx    Esophageal cancer Neg Hx    Rectal cancer Neg Hx    Stomach cancer Neg Hx    Pancreatic cancer Neg Hx    Past Surgical History:  Procedure Laterality Date   ABDOMINAL HYSTERECTOMY     CHOLECYSTECTOMY N/A 07/15/2013   Procedure: LAPAROSCOPIC CHOLECYSTECTOMY;  Surgeon: Donnice Bury, MD;  Location: [facility] OR;  Service: General;  Laterality: N/A;   COLONOSCOPY     ECTOPIC PREGNANCY SURGERY     laparotomy   HEMORRHOID BANDING     ROBOTIC ASSISTED LAP VAGINAL HYSTERECTOMY     Social History   Tobacco Use   Smoking status: Former    Current packs/day: 0.00    Types: Cigarettes    Quit date: 04/18/2008    Years since quitting: 15.8    Passive exposure: Past   Smokeless tobacco: Never  Vaping Use   Vaping status: Never Used  Substance Use Topics   Alcohol use: Not Currently    Comment: 1 glass of wine rarely, occasionally  Drug use: Yes    Types: Marijuana    Comment: last use this am partial blunt, Former cocaine use since 2012   Social History   Social History Narrative   Lives alone.     No children, no pets.   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     Immunization History  Administered Date(s) Administered   Hepatitis B, ADULT 12/28/2015, 05/27/2016   Hepatitis B, PED/ADOLESCENT 11/17/2015   Influenza Split 01/06/2011, 12/17/2014   Influenza, Seasonal, Injecte, Preservative Fre 05/03/2023, 12/20/2023   Influenza,inj,Quad PF,6+ Mos 01/18/2016, 02/02/2017, 12/12/2018   PFIZER(Purple Top)SARS-COV-2 Vaccination 03/26/2019, 04/16/2019, 02/25/2020   PNEUMOCOCCAL CONJUGATE-20 10/17/2023   PPD Test 11/13/2015, 04/02/2018   Pneumococcal Conjugate-13 11/14/2018   Tdap 01/18/2016     Objective: Vital Signs: BP 119/76 (BP Location: Right Arm, Patient Position: Sitting, Cuff Size: Small)   Pulse 80   Temp (!) 97 F (36.1 C)   Resp 12    Ht 5' 6 (1.676 m)   Wt 197 lb 9.6 oz (89.6 kg)   LMP 04/25/2011   BMI 31.89 kg/m    Physical Exam Vitals and nursing note reviewed.  HENT:     Head: Normocephalic and atraumatic.     Nose: Nose normal.  Eyes:     Conjunctiva/sclera: Conjunctivae normal.     Pupils: Pupils are equal, round, and reactive to light.  Pulmonary:     Effort: Pulmonary effort is normal. No respiratory distress.  Musculoskeletal:     Comments: + phalen's test bilaterally  Skin:    General: Skin is warm and dry.  Neurological:     Mental Status: She is alert. Mental status is at baseline.  Psychiatric:        Mood and Affect: Mood normal.        Behavior: Behavior normal.      Musculoskeletal Exam:   CDAI Exam: CDAI Score: -- Patient Global: --; Provider Global: -- Swollen: --; Tender: -- Joint Exam 02/05/2024   No joint exam has been documented for this visit   There is currently no information documented on the homunculus. Go to the Rheumatology activity and complete the homunculus joint exam.  Investigation: No additional findings.  Imaging: No results found.   Recent Labs: Lab Results  Component Value Date   WBC 6.0 10/17/2023   HGB 13.2 10/17/2023   PLT 231.0 10/17/2023   NA 139 12/20/2023   K 3.8 12/20/2023   CL 103 12/20/2023   CO2 29 12/20/2023   GLUCOSE 112 (H) 12/20/2023   BUN 13 12/20/2023   CREATININE 0.93 12/20/2023   BILITOT 0.6 12/20/2023   ALKPHOS 50 12/20/2023   AST 20 12/20/2023   ALT 22 12/20/2023   PROT 7.7 12/20/2023   ALBUMIN 4.3 12/20/2023   CALCIUM  9.2 12/20/2023   GFRAA 84 04/24/2020    Speciality Comments: No specialty comments available.  Procedures:  No procedures performed Allergies: Augmentin  [amoxicillin -pot clavulanate]   Assessment / Plan:     Visit Diagnoses:  Polyarthralgia Osteoarthritis Patient with joint pain that appears to be mostly non-inflammatory in nature w/ b/l XR's demonstrating OA in PIP's and DIP's. She also had  negative RF and CCP and negative inflammatory markers, which is reassuring. As discussed with patient, her symptoms are likely mostly osteoarthritis in nature. However, her AM stiffness is a little longer than I would expect from just OA. Discussed with patient that if she wanted we could trial a 6 month course of HCQ to  see if this improved her symptoms, but cannot guarantee this would provide her with any benefit given unclear if inflammatory in nature. Patient wishes to refrain from HCQ at this time, wishes to trial a course of NSAIDs. Patient instructed to take Naproxen  500mg  BID for 14 days, then continue taking BID PRN. Discussed risks of NSAID use including but not limited to kidney injury, elevation in liver enzymes, GI side effects.  Advised not to combine with steroids or other NSAIDs, such as Aleve  or Ibuprofen . Patient verbalizes understanding.  Bilateral carpal tunnel syndrome Patient with numbness and tingling of b/l hands w/ + phalen's test bilaterally, consistent with diagnosis of carpel tunnel syndrome. Patient provided with prescription for braces from medical supply store. Advised to wear braces at night to prevent symptoms.  Orders: No orders of the defined types were placed in this encounter.  Meds ordered this encounter  Medications   naproxen  (NAPROSYN ) 500 MG tablet    Sig: Take 1 tablet (500 mg total) by mouth 2 (two) times daily with a meal for 14 days, THEN 1 tablet (500 mg total) 2 (two) times daily as needed.    Dispense:  56 tablet    Refill:  0    I personally spent a total of 40 minutes in the care of the patient today including preparing to see the patient, getting/reviewing separately obtained history, performing a medically appropriate exam/evaluation, counseling and educating, placing orders, documenting clinical information in the EHR, independently interpreting results, and communicating results.   Follow-Up Instructions: Return in about 6 months (around  08/05/2024).   Asberry Claw, DO  Note - This record has been created using Animal nutritionist.  Chart creation errors have been sought, but may not always  have been located. Such creation errors do not reflect on  the standard of medical care.

## 2024-01-26 ENCOUNTER — Ambulatory Visit: Payer: Self-pay

## 2024-02-05 ENCOUNTER — Ambulatory Visit

## 2024-02-05 VITALS — BP 119/76 | HR 80 | Temp 97.0°F | Resp 12 | Ht 66.0 in | Wt 197.6 lb

## 2024-02-05 DIAGNOSIS — M2550 Pain in unspecified joint: Secondary | ICD-10-CM

## 2024-02-05 DIAGNOSIS — G5603 Carpal tunnel syndrome, bilateral upper limbs: Secondary | ICD-10-CM | POA: Diagnosis not present

## 2024-02-05 DIAGNOSIS — M1990 Unspecified osteoarthritis, unspecified site: Secondary | ICD-10-CM | POA: Diagnosis not present

## 2024-02-05 DIAGNOSIS — R7689 Other specified abnormal immunological findings in serum: Secondary | ICD-10-CM | POA: Diagnosis not present

## 2024-02-05 MED ORDER — NAPROXEN 500 MG PO TABS: ORAL_TABLET | ORAL | 0 refills | Status: AC

## 2024-02-07 ENCOUNTER — Encounter: Payer: Self-pay | Admitting: Family Medicine

## 2024-02-13 DIAGNOSIS — F439 Reaction to severe stress, unspecified: Secondary | ICD-10-CM | POA: Diagnosis not present

## 2024-02-19 DIAGNOSIS — F439 Reaction to severe stress, unspecified: Secondary | ICD-10-CM | POA: Diagnosis not present

## 2024-03-08 ENCOUNTER — Other Ambulatory Visit: Payer: Self-pay | Admitting: Family Medicine

## 2024-03-08 DIAGNOSIS — I10 Essential (primary) hypertension: Secondary | ICD-10-CM

## 2024-03-12 NOTE — Progress Notes (Signed)
 I agree with the assessment and plan as outlined by Ms. Cristi Loron.

## 2024-04-03 ENCOUNTER — Encounter: Payer: Self-pay | Admitting: *Deleted

## 2024-04-03 NOTE — Progress Notes (Signed)
 LEEA RAMBEAU                                          MRN: [id number]   04/03/2024   The [facility] Quality Team Specialist reviewed this patient medical record for the purposes of chart review for care gap closure. The following were reviewed: abstraction for care gap closure-kidney health evaluation for diabetes:eGFR  and uACR.    [facility] Quality Team

## 2024-04-12 ENCOUNTER — Ambulatory Visit: Admitting: Family Medicine

## 2024-04-12 ENCOUNTER — Ambulatory Visit: Payer: Self-pay | Admitting: Family Medicine

## 2024-04-12 ENCOUNTER — Encounter: Payer: Self-pay | Admitting: Family Medicine

## 2024-04-12 VITALS — BP 126/80 | HR 79 | Ht 66.0 in | Wt 201.0 lb

## 2024-04-12 DIAGNOSIS — F3161 Bipolar disorder, current episode mixed, mild: Secondary | ICD-10-CM

## 2024-04-12 DIAGNOSIS — M19042 Primary osteoarthritis, left hand: Secondary | ICD-10-CM

## 2024-04-12 DIAGNOSIS — I152 Hypertension secondary to endocrine disorders: Secondary | ICD-10-CM

## 2024-04-12 DIAGNOSIS — E669 Obesity, unspecified: Secondary | ICD-10-CM

## 2024-04-12 DIAGNOSIS — K649 Unspecified hemorrhoids: Secondary | ICD-10-CM

## 2024-04-12 DIAGNOSIS — E1169 Type 2 diabetes mellitus with other specified complication: Secondary | ICD-10-CM

## 2024-04-12 DIAGNOSIS — E538 Deficiency of other specified B group vitamins: Secondary | ICD-10-CM

## 2024-04-12 DIAGNOSIS — G5603 Carpal tunnel syndrome, bilateral upper limbs: Secondary | ICD-10-CM

## 2024-04-12 DIAGNOSIS — E559 Vitamin D deficiency, unspecified: Secondary | ICD-10-CM

## 2024-04-12 LAB — COMPREHENSIVE METABOLIC PANEL WITH GFR
ALT: 20 U/L (ref 3–35)
AST: 15 U/L (ref 5–37)
Albumin: 4.3 g/dL (ref 3.5–5.2)
Alkaline Phosphatase: 51 U/L (ref 39–117)
BUN: 14 mg/dL (ref 6–23)
CO2: 28 meq/L (ref 19–32)
Calcium: 9.6 mg/dL (ref 8.4–10.5)
Chloride: 101 meq/L (ref 96–112)
Creatinine, Ser: 0.83 mg/dL (ref 0.40–1.20)
GFR: 78.81 mL/min
Glucose, Bld: 122 mg/dL — ABNORMAL HIGH (ref 70–99)
Potassium: 4 meq/L (ref 3.5–5.1)
Sodium: 139 meq/L (ref 135–145)
Total Bilirubin: 0.7 mg/dL (ref 0.2–1.2)
Total Protein: 8.2 g/dL (ref 6.0–8.3)

## 2024-04-12 LAB — CBC WITH DIFFERENTIAL/PLATELET
Basophils Absolute: 0.1 10*3/uL (ref 0.0–0.1)
Basophils Relative: 0.6 % (ref 0.0–3.0)
Eosinophils Absolute: 0.2 10*3/uL (ref 0.0–0.7)
Eosinophils Relative: 2 % (ref 0.0–5.0)
HCT: 38.2 % (ref 36.0–46.0)
Hemoglobin: 12.9 g/dL (ref 12.0–15.0)
Lymphocytes Relative: 35.9 % (ref 12.0–46.0)
Lymphs Abs: 3 10*3/uL (ref 0.7–4.0)
MCHC: 33.7 g/dL (ref 30.0–36.0)
MCV: 89.3 fl (ref 78.0–100.0)
Monocytes Absolute: 0.4 10*3/uL (ref 0.1–1.0)
Monocytes Relative: 4.5 % (ref 3.0–12.0)
Neutro Abs: 4.8 10*3/uL (ref 1.4–7.7)
Neutrophils Relative %: 57 % (ref 43.0–77.0)
Platelets: 272 10*3/uL (ref 150.0–400.0)
RBC: 4.27 Mil/uL (ref 3.87–5.11)
RDW: 15.8 % — ABNORMAL HIGH (ref 11.5–15.5)
WBC: 8.5 10*3/uL (ref 4.0–10.5)

## 2024-04-12 LAB — VITAMIN B12: Vitamin B-12: 695 pg/mL (ref 211–911)

## 2024-04-12 LAB — HEMOGLOBIN A1C: Hgb A1c MFr Bld: 6.8 % — ABNORMAL HIGH (ref 4.6–6.5)

## 2024-04-12 LAB — VITAMIN D 25 HYDROXY (VIT D DEFICIENCY, FRACTURES): VITD: 33.4 ng/mL (ref 30.00–100.00)

## 2024-04-12 MED ORDER — ROSUVASTATIN CALCIUM 5 MG PO TABS: 5.0000 mg | ORAL_TABLET | Freq: Every day | ORAL | 1 refills | Status: AC

## 2024-04-12 NOTE — Progress Notes (Signed)
 "  Subjective:     Patient ID: Deborah Haney, female    DOB: 24-Jul-1965, 59 y.o.   MRN: [id number]  Chief Complaint  Patient presents with   Medical Management of Chronic Issues    HPI  Discussed the use of AI scribe software for clinical note transcription with the patient, who gave verbal consent to proceed.  History of Present Illness Deborah Haney is a 59 year old female with type 2 diabetes, hyperlipidemia, and hypertension who presents with concerns of hyperglycemia.  Hyperglycemia and diabetes management - Concerned about elevated blood glucose levels - currently diet controlled  - Currently not taking diabetes medication - Last hemoglobin A1c was 6.9% on October 17, 2023 - Discontinued Mounjaro  due to severe constipation, nausea, and bloating - Has not tried metformin  Hypertension - Participating in a work program monitoring blood pressure twice daily - Blood pressure increases during episodes of hyperglycemia with BS up to 134 at times - Develops low-grade headache when blood pressure and blood sugar are high  Hyperlipidemia - Takes rosuvastatin  for lipid management - Has been off rosuvastatin  for one month due to pharmacy issues - Plans to restart rosuvastatin   Bipolar disorder - Stable on Abilify  - Under care of a new psychiatrist with [facility]  Gastrointestinal symptoms - Blood in stool- ongoing issue due to hemorrhoids - Loose stools with oily appearance - Uses cholestyramine  as needed but inconsistently - follows with GI  Musculoskeletal symptoms - Osteoarthritis in hands per rheumatologist and no inflammatory arthritis  - Bilateral carpal tunnel syndrome causing hand numbness  Vitamin b12 deficiency - Takes over-the-counter vitamin B12 supplements     Health Maintenance Due  Topic Date Due   Zoster Vaccines- Shingrix (1 of 2) Never done   OPHTHALMOLOGY EXAM  02/23/2022   COVID-19 Vaccine (4 - 2025-26 season) 11/06/2023   FOOT EXAM   02/17/2024    Past Medical History:  Diagnosis Date   Acute cholecystitis 07/14/2013   Acute on chronic respiratory failure with hypoxia (HCC) 09/05/2018   Anemia    has history, takes iron   Anxiety    Bipolar affective disorder (HCC) 10/26/2015   Has seen counselor, declined medications per medical record from [facility]. hypermanic   Chest pain 07/14/2013   Chronic vaginitis 10/26/2015   Has h/o vag dryness and tried Estrace per [facility]   COVID-19 virus infection 08/24/2018   Depression    Diabetes (HCC)    Patient denies   Diverticulitis    no problems   Dysfunctional uterine bleeding 03/02/2011   Elevated hemoglobin A1c 10/26/2015   GERD (gastroesophageal reflux disease)    tums prn   Headache(784.0)    Hemorrhoids    HTN (hypertension)    BP meds since fall 2016   Internal hemorrhoids    Nausea & vomiting 07/14/2013   Obesity 10/26/2015   Perimenopausal symptom 04/30/2021   Rectal bleeding 03/15/2017    Past Surgical History:  Procedure Laterality Date   ABDOMINAL HYSTERECTOMY     CHOLECYSTECTOMY N/A 07/15/2013   Procedure: LAPAROSCOPIC CHOLECYSTECTOMY;  Surgeon: Donnice Bury, MD;  Location: [facility] OR;  Service: General;  Laterality: N/A;   COLONOSCOPY     ECTOPIC PREGNANCY SURGERY     laparotomy   HEMORRHOID BANDING     ROBOTIC ASSISTED LAP VAGINAL HYSTERECTOMY      Family History  Problem Relation Age of Onset   Diabetes Mother    Hypertension Mother    Arthritis Father 33   Hypertension Brother  Cancer Brother        blood    Cancer Brother        unsure kind, as a child    CVA Other    Colon cancer Neg Hx    Colon polyps Neg Hx    Esophageal cancer Neg Hx    Rectal cancer Neg Hx    Stomach cancer Neg Hx    Pancreatic cancer Neg Hx     Social History   Socioeconomic History   Marital status: Divorced    Spouse name: Not on file   Number of children: 0   Years of education: Not on file   Highest education level: Master's degree  (e.g., MA, MS, MEng, MEd, MSW, MBA)  Occupational History   Occupation: child psychotherapist, dealer  Tobacco Use   Smoking status: Former    Current packs/day: 0.00    Types: Cigarettes    Quit date: 04/18/2008    Years since quitting: 15.9    Passive exposure: Past   Smokeless tobacco: Never  Vaping Use   Vaping status: Never Used  Substance and Sexual Activity   Alcohol use: Not Currently    Comment: 1 glass of wine rarely, occasionally   Drug use: Yes    Types: Marijuana    Comment: last use this am partial blunt, Former cocaine use since 2012   Sexual activity: Yes    Partners: Male    Birth control/protection: Surgical  Other Topics Concern   Not on file  Social History Narrative   Lives alone.     No children, no pets.   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   Social Drivers of Health   Tobacco Use: Medium Risk (04/12/2024)   Patient History    Smoking Tobacco Use: Former    Smokeless Tobacco Use: Never    Passive Exposure: Past  Physicist, Medical Strain: Medium Risk (04/11/2024)   Overall Financial Resource Strain (CARDIA)    Difficulty of Paying Living Expenses: Somewhat hard  Food Insecurity: No Food Insecurity (04/11/2024)   Epic    Worried About Programme Researcher, Broadcasting/film/video in the Last Year: Never true    Ran Out of Food in the Last Year: Never true  Transportation Needs: No Transportation Needs (04/11/2024)   Epic    Lack of Transportation (Medical): No    Lack of Transportation (Non-Medical): No  Physical Activity: Insufficiently Active (04/11/2024)   Exercise Vital Sign    Days of Exercise per Week: 2 days    Minutes of Exercise per Session: 30 min  Stress: No Stress Concern Present (04/11/2024)   [employer] of Occupational Health - Occupational Stress Questionnaire    Feeling of Stress: Not at all  Social Connections: Socially Integrated (04/11/2024)   Social Connection and Isolation Panel    Frequency of Communication with  Friends and Family: More than three times a week    Frequency of Social Gatherings with Friends and Family: Three times a week    Attends Religious Services: More than 4 times per year    Active Member of Clubs or Organizations: Yes    Attends Banker Meetings: More than 4 times per year    Marital Status: Living with partner  Intimate Partner Violence: Not on file  Depression (PHQ2-9): Low Risk (10/14/2022)   Depression (PHQ2-9)    PHQ-2 Score: 4  Alcohol Screen: Low Risk (04/11/2024)   Alcohol Screen    Last Alcohol Screening Score (AUDIT):  1  Housing: Unknown (04/11/2024)   Epic    Unable to Pay for Housing in the Last Year: No    Number of Times Moved in the Last Year: Not on file    Homeless in the Last Year: No  Utilities: Not on file  Health Literacy: Not on file    Outpatient Medications Prior to Visit  Medication Sig Dispense Refill   Accu-Chek Softclix Lancets lancets Use as instructed;  Check FSBS BID - Include strips # 50 with 5 refills, Lancets #50 with 5 refills DX: Type 2 DM - ICD 10: E11.9 100 each 12   ARIPiprazole  (ABILIFY ) 5 MG tablet Take 1 tablet (5 mg total) by mouth daily. 30 tablet 2   Blood Glucose Monitoring Suppl (CONTOUR MONITOR) w/Device KIT 1 kit by Does not apply route 2 (two) times a day. 1 kit 0   cholestyramine  (QUESTRAN ) 4 g packet MIX AND DRINK 1 PACKET DAILY 90 packet 0   clotrimazole -betamethasone  (LOTRISONE ) cream Apply 1 Application topically daily. 30 g 0   estradiol (VIVELLE-DOT) 0.025 MG/24HR APPLY 1 PATCH TOPICALLY TWICE A WEEK     fluticasone  (FLONASE ) 50 MCG/ACT nasal spray Place 2 sprays into both nostrils daily. 16 g 6   gabapentin  (NEURONTIN ) 100 MG capsule Take 100 mg by mouth 3 (three) times daily.     glucose blood (CONTOUR NEXT TEST) test strip Use as instructed 100 each 0   hydrocortisone  (ANUSOL -HC) 2.5 % rectal cream Place 1 Application rectally 2 (two) times daily. 30 g 2   lisinopril -hydrochlorothiazide  (ZESTORETIC )  10-12.5 MG tablet Take 1 tablet by mouth once daily 90 tablet 0   LYLLANA 0.1 MG/24HR patch 1 patch 2 (two) times a week.     mupirocin  cream (BACTROBAN ) 2 % Apply 1 application. topically 2 (two) times daily. 15 g 1   ondansetron  (ZOFRAN -ODT) 4 MG disintegrating tablet Take 1 tablet (4 mg total) by mouth every 8 (eight) hours as needed for nausea or vomiting. 20 tablet 0   Probiotic Product (PROBIOTIC-10 PO) Take 1 mg by mouth 1 day or 1 dose. Pt. Takes every other day     VITAMIN D  PO Take by mouth.     rosuvastatin  (CRESTOR ) 5 MG tablet Take 1 tablet (5 mg total) by mouth daily. 90 tablet 1   tirzepatide  (MOUNJARO ) 5 MG/0.5ML Pen Inject 5 mg into the skin once a week. (Patient not taking: Reported on 04/12/2024) 2 mL 2   No facility-administered medications prior to visit.    Allergies[1]  Review of Systems  Constitutional:  Negative for chills, fever, malaise/fatigue and weight loss.  Respiratory:  Negative for shortness of breath.   Cardiovascular:  Negative for chest pain, palpitations and leg swelling.  Gastrointestinal:  Negative for abdominal pain, constipation, diarrhea, nausea and vomiting.  Genitourinary:  Negative for dysuria, frequency and urgency.  Musculoskeletal:  Positive for joint pain.  Neurological:  Negative for dizziness, focal weakness and headaches.  Psychiatric/Behavioral:  Negative for depression. The patient is not nervous/anxious.        Objective:    Physical Exam Constitutional:      General: She is not in acute distress.    Appearance: She is not ill-appearing.  HENT:     Mouth/Throat:     Mouth: Mucous membranes are moist.     Pharynx: Oropharynx is clear.  Eyes:     Extraocular Movements: Extraocular movements intact.     Conjunctiva/sclera: Conjunctivae normal.     Pupils: Pupils are equal,  round, and reactive to light.  Cardiovascular:     Rate and Rhythm: Normal rate and regular rhythm.  Pulmonary:     Effort: Pulmonary effort is normal.      Breath sounds: Normal breath sounds.  Musculoskeletal:     Cervical back: Normal range of motion and neck supple.     Right lower leg: No edema.     Left lower leg: No edema.  Skin:    General: Skin is warm and dry.  Neurological:     General: No focal deficit present.     Mental Status: She is alert and oriented to person, place, and time.     Cranial Nerves: No cranial nerve deficit.     Motor: No weakness.     Coordination: Coordination normal.     Gait: Gait normal.  Psychiatric:        Mood and Affect: Mood normal.        Behavior: Behavior normal.        Thought Content: Thought content normal.      BP 126/80   Pulse 79   Ht 5' 6 (1.676 m)   Wt 201 lb (91.2 kg)   LMP 04/25/2011   SpO2 98%   BMI 32.44 kg/m  Wt Readings from Last 3 Encounters:  04/12/24 201 lb (91.2 kg)  02/05/24 197 lb 9.6 oz (89.6 kg)  01/04/24 200 lb 3.2 oz (90.8 kg)       Assessment & Plan:   Problem List Items Addressed This Visit     Bipolar affective disorder (HCC)   Vitamin D  deficiency   Relevant Orders   VITAMIN D  25 Hydroxy (Vit-D Deficiency, Fractures)   Other Visit Diagnoses       Type 2 diabetes mellitus in patient with obesity (HCC)    -  Primary   Relevant Medications   rosuvastatin  (CRESTOR ) 5 MG tablet   Other Relevant Orders   CBC with Differential/Platelet   Comprehensive metabolic panel with GFR   Hemoglobin A1c     Hypertension associated with diabetes (HCC)       Relevant Medications   rosuvastatin  (CRESTOR ) 5 MG tablet   Other Relevant Orders   CBC with Differential/Platelet   Comprehensive metabolic panel with GFR     Vitamin B 12 deficiency       Relevant Orders   Vitamin B12     Primary osteoarthritis of both hands         Bilateral carpal tunnel syndrome         Bleeding hemorrhoids       Relevant Medications   rosuvastatin  (CRESTOR ) 5 MG tablet     Hyperlipidemia associated with type 2 diabetes mellitus (HCC)       Relevant Medications    rosuvastatin  (CRESTOR ) 5 MG tablet       Assessment and Plan Assessment & Plan Type 2 diabetes mellitus in patient with obesity Previously on Mounjaro  5 mg, discontinued due to severe gastrointestinal side effects including constipation, nausea, and bloating. Blood glucose levels have not exceeded 134 mg/dL. Considering starting Glucose Be Gone (Berberine) for blood sugar management. Metformin discussed as a potential option due to its common side effect of loose stools, which may address constipation issues. - Ordered hemoglobin A1c test - Will consider starting metformin if needed for blood sugar management  Essential hypertension Blood pressure monitoring through a work program. Blood pressure spikes noted, often associated with stress or pain. T - Continue blood pressure monitoring program  Mixed hyperlipidemia Previously on rosuvastatin , which was lost in the mail and not refilled for a month. Cholesterol levels were good three months ago. - Refilled rosuvastatin  prescription at [facility], Devora Countryman - We will recheck fasting lipids at her follow-up in 4 months  Bipolar affective disorder Well-controlled on Abilify . Transitioned to a new psychiatrist at [facility] for ongoing management and potential tapering of Abilify . - Continue Abilify  as prescribed by psychiatrist  Primary osteoarthritis of both hands Diagnosed with osteoarthritis in both hands. No evidence of inflammatory arthritis. - Reviewed rheumatologist notes  Bilateral carpal tunnel syndrome Diagnosed with bilateral carpal tunnel syndrome. Prescribed wrist splints for nighttime use, but has not yet obtained them due to insurance issues. - Obtain wrist splints for nighttime use  Bleeding hemorrhoids Experiencing recurrent bleeding hemorrhoids. Previous treatment included hemorrhoid banding. Reports ongoing blood in stool and irregular stool consistency. - Schedule follow-up appointment with GI  specialist  Vitamin D  deficiency Currently taking vitamin D  supplements. - Continue vitamin D  supplementation - check vitamin D  level  Vitamin B12 deficiency Currently taking over-the-counter vitamin B12. If B12 levels remain low, will consider monthly B12 injections. - Ordered B12 level test - Will consider monthly B12 injections if levels remain low  General health maintenance Due for mammogram and eye exam. Declined shingles vaccine. - Schedule mammogram - Schedule eye exam     I have discontinued Josy N. Albanese's tirzepatide . I am also having her maintain her Contour Monitor, Probiotic Product (PROBIOTIC-10 PO), Accu-Chek Softclix Lancets, mupirocin  cream, cholestyramine , fluticasone , estradiol, Contour Next Test, clotrimazole -betamethasone , ondansetron , VITAMIN D  PO, ARIPiprazole , gabapentin , hydrocortisone , Lyllana, lisinopril -hydrochlorothiazide , and rosuvastatin .  Meds ordered this encounter  Medications   rosuvastatin  (CRESTOR ) 5 MG tablet    Sig: Take 1 tablet (5 mg total) by mouth daily.    Dispense:  90 tablet    Refill:  1    Supervising Provider:   ROLLENE NORRIS A [[id number]]       [1]  Allergies Allergen Reactions   Augmentin  [Amoxicillin -Pot Clavulanate] Rash   "

## 2024-04-12 NOTE — Patient Instructions (Signed)
 Please schedule your mammogram and eye exam!!!

## 2024-05-30 ENCOUNTER — Ambulatory Visit: Admitting: Dermatology

## 2024-08-05 ENCOUNTER — Ambulatory Visit
